# Patient Record
Sex: Female | Born: 1940 | Race: White | Hispanic: No | Smoking: Never smoker
Health system: Southern US, Community
[De-identification: ages and names within clinical notes are randomized; demographics above are authoritative.]

## PROBLEM LIST (undated history)

## (undated) DIAGNOSIS — E785 Hyperlipidemia, unspecified: Secondary | ICD-10-CM

## (undated) DIAGNOSIS — I77810 Thoracic aortic ectasia: Secondary | ICD-10-CM

## (undated) DIAGNOSIS — I480 Paroxysmal atrial fibrillation: Secondary | ICD-10-CM

## (undated) DIAGNOSIS — D649 Anemia, unspecified: Secondary | ICD-10-CM

## (undated) DIAGNOSIS — M1990 Unspecified osteoarthritis, unspecified site: Secondary | ICD-10-CM

## (undated) DIAGNOSIS — R0600 Dyspnea, unspecified: Secondary | ICD-10-CM

## (undated) DIAGNOSIS — R51 Headache: Secondary | ICD-10-CM

## (undated) DIAGNOSIS — N189 Chronic kidney disease, unspecified: Secondary | ICD-10-CM

## (undated) DIAGNOSIS — I469 Cardiac arrest, cause unspecified: Secondary | ICD-10-CM

## (undated) DIAGNOSIS — I7121 Aneurysm of the ascending aorta, without rupture: Secondary | ICD-10-CM

## (undated) DIAGNOSIS — K219 Gastro-esophageal reflux disease without esophagitis: Secondary | ICD-10-CM

## (undated) DIAGNOSIS — R011 Cardiac murmur, unspecified: Secondary | ICD-10-CM

## (undated) DIAGNOSIS — I712 Thoracic aortic aneurysm, without rupture: Secondary | ICD-10-CM

## (undated) DIAGNOSIS — T8209XA Other mechanical complication of heart valve prosthesis, initial encounter: Secondary | ICD-10-CM

## (undated) DIAGNOSIS — Z8679 Personal history of other diseases of the circulatory system: Secondary | ICD-10-CM

## (undated) DIAGNOSIS — G43909 Migraine, unspecified, not intractable, without status migrainosus: Secondary | ICD-10-CM

## (undated) DIAGNOSIS — Z8669 Personal history of other diseases of the nervous system and sense organs: Secondary | ICD-10-CM

## (undated) DIAGNOSIS — I499 Cardiac arrhythmia, unspecified: Secondary | ICD-10-CM

## (undated) HISTORY — PX: TONSILLECTOMY: SUR1361

## (undated) HISTORY — DX: Personal history of other diseases of the nervous system and sense organs: Z86.69

## (undated) HISTORY — DX: Thoracic aortic ectasia: I77.810

## (undated) HISTORY — DX: Aneurysm of the ascending aorta, without rupture: I71.21

## (undated) HISTORY — DX: Personal history of other diseases of the circulatory system: Z86.79

## (undated) HISTORY — DX: Hyperlipidemia, unspecified: E78.5

## (undated) HISTORY — DX: Thoracic aortic aneurysm, without rupture: I71.2

## (undated) HISTORY — PX: EYE SURGERY: SHX253

## (undated) HISTORY — PX: ABDOMINAL HYSTERECTOMY: SHX81

## (undated) SURGERY — ECHOCARDIOGRAM, TRANSESOPHAGEAL
Anesthesia: Moderate Sedation

---

## 1998-06-13 ENCOUNTER — Ambulatory Visit (HOSPITAL_COMMUNITY): Admission: RE | Admit: 1998-06-13 | Discharge: 1998-06-13 | Payer: Self-pay | Admitting: Cardiology

## 2002-08-10 ENCOUNTER — Encounter: Payer: Self-pay | Admitting: Cardiovascular Disease

## 2002-08-10 ENCOUNTER — Ambulatory Visit (HOSPITAL_COMMUNITY): Admission: RE | Admit: 2002-08-10 | Discharge: 2002-08-10 | Payer: Self-pay | Admitting: Cardiovascular Disease

## 2002-09-30 ENCOUNTER — Encounter: Payer: Self-pay | Admitting: Cardiothoracic Surgery

## 2002-10-04 ENCOUNTER — Inpatient Hospital Stay (HOSPITAL_COMMUNITY): Admission: RE | Admit: 2002-10-04 | Discharge: 2002-10-12 | Payer: Self-pay | Admitting: Cardiothoracic Surgery

## 2002-10-04 ENCOUNTER — Encounter: Payer: Self-pay | Admitting: Cardiothoracic Surgery

## 2002-10-04 DIAGNOSIS — Z8679 Personal history of other diseases of the circulatory system: Secondary | ICD-10-CM

## 2002-10-04 HISTORY — PX: AORTIC VALVE REPLACEMENT: SHX41

## 2002-10-04 HISTORY — DX: Personal history of other diseases of the circulatory system: Z86.79

## 2002-10-05 ENCOUNTER — Encounter: Payer: Self-pay | Admitting: Cardiothoracic Surgery

## 2002-10-06 ENCOUNTER — Encounter: Payer: Self-pay | Admitting: Cardiothoracic Surgery

## 2002-10-07 ENCOUNTER — Encounter: Payer: Self-pay | Admitting: Cardiothoracic Surgery

## 2002-10-10 ENCOUNTER — Encounter: Payer: Self-pay | Admitting: Cardiothoracic Surgery

## 2002-11-01 ENCOUNTER — Encounter: Payer: Self-pay | Admitting: Cardiothoracic Surgery

## 2002-11-01 ENCOUNTER — Encounter: Admission: RE | Admit: 2002-11-01 | Discharge: 2002-11-01 | Payer: Self-pay | Admitting: Cardiothoracic Surgery

## 2010-10-15 ENCOUNTER — Ambulatory Visit (HOSPITAL_COMMUNITY): Payer: Medicare Other | Attending: Urology

## 2010-10-15 ENCOUNTER — Ambulatory Visit (HOSPITAL_BASED_OUTPATIENT_CLINIC_OR_DEPARTMENT_OTHER)
Admission: RE | Admit: 2010-10-15 | Discharge: 2010-10-15 | Disposition: A | Discharge status: Home / Self Care | Payer: Medicare Other | Source: Ambulatory Visit | Attending: Urology | Admitting: Urology

## 2010-10-15 DIAGNOSIS — N393 Stress incontinence (female) (male): Secondary | ICD-10-CM | POA: Insufficient documentation

## 2010-10-15 DIAGNOSIS — N8111 Cystocele, midline: Secondary | ICD-10-CM | POA: Insufficient documentation

## 2010-10-15 DIAGNOSIS — Z01818 Encounter for other preprocedural examination: Secondary | ICD-10-CM | POA: Insufficient documentation

## 2010-10-15 DIAGNOSIS — Z01812 Encounter for preprocedural laboratory examination: Secondary | ICD-10-CM | POA: Insufficient documentation

## 2010-10-15 LAB — POCT I-STAT 4, (NA,K, GLUC, HGB,HCT)
Glucose, Bld: 98 mg/dL (ref 70–99)
HCT: 38 % (ref 36.0–46.0)
Hemoglobin: 12.9 g/dL (ref 12.0–15.0)
Potassium: 4.5 mEq/L (ref 3.5–5.1)
Sodium: 143 mEq/L (ref 135–145)

## 2010-10-19 NOTE — Op Note (Signed)
[name] [name], [name] [name]             ACCOUNT NO.:  [id number]  MEDICAL RECORD NO.:  [id number]           PATIENT TYPE:  LOCATION:                                 FACILITY:  PHYSICIAN:  Daman Steffenhagen C. Karsten Ro, M.D.       DATE OF BIRTH:  DATE OF PROCEDURE:  10/15/2010 DATE OF DISCHARGE:                              OPERATIVE REPORT   PREOPERATIVE DIAGNOSES: 1. Cystocele. 2. Stress incontinence.  POSTOPERATIVE DIAGNOSIS:  Cystocele.  PROCEDURE:  Anterior repair.  SURGEON:  Baylie Drakes C. Karsten Ro, MD  ANESTHESIA:  General.  SPECIMENS:  None.  BLOOD LOSS:  Minimal.  DRAINS:  None.  COMPLICATIONS:  None.  INDICATIONS:  The patient is a 70 year old female who initially presented with a large cystocele, what was felt to be possible stress urinary incontinence.  She noted no stress incontinence prior to the development of her cystocele.  She then reported some mild leakage and I therefore performed urodynamics and found that in fact she did not leak with her cystocele reduced despite provocative testing.  I still felt that by her history, consideration of a suburethral sling in conjunction with her anterior repair should be performed and discussed the procedure as well as its risks and complications.  She understands and has elected to proceed.  DESCRIPTION OF OPERATION:  After the patient received preoperative antibiotics, which consisted of ampicillin 2 g plus gentamicin 1.5 mg/kg due to her prosthetic heart valve.  She was then brought to the major OR and placed on the table.  She was administered general anesthesia and then moved to the dorsal lithotomy position.  Her genitalia and vagina as well as perineum were sterilely prepped and draped.  I then performed an official time-out.  I initially placed a 16-French Foley catheter in the bladder and drained the bladder and a weighted speculum in the vagina.  I noted significant cystocele, but in fact, the urethrovesical junction  appeared to be high and well supported.  I injected 0.5% Marcaine with epinephrine in the subvaginal mucosa in the midline, and after allowing adequate time for epinephrine effect, a midline incision was made over the cystocele and Allis clamps were placed on the vaginal mucosal edges.  I then used a combination of sharp and blunt technique to dissect the cystocele from the vaginal mucosa on the right and left sides as well as posteriorly. I then reduced the cystocele by using 2-0 braided permanent suture and used this to reapproximate the pubocervical fascia in the midline in an interrupted figure-of-8 fashion.  This completely reduced/obliterated the cystocele.  I then excised the redundant vaginal mucosa, irrigated and then closed the incision with running 2-0 Vicryl suture.  The patient was placed in exaggerated Trendelenburg position, and with the cystocele reduced, I palpated the urethra which was very well supported with no evidence of hypermobility and traction on the vaginal mucosa and this area resulted in no mobility identifiable.  Because it was in such good position with no mobility, I felt that placement of a sling was not necessary and therefore elected not to place the sling at this time.  I therefore irrigated the  vagina and placed 2-inch Iodoform gauze with Neosporin antibiotic ointment in the vagina and the patient was awakened and taken to recovery room after her Foley catheter had been removed.  She will be given discharge instructions as well as maintained on Cipro 250 mg b.i.d. #6 and given a prescription for Vicodin HP #28.  She will follow up in my office in 1 week.     [name] [name] C. Karsten Ro, M.D.     MCO/MEDQ  D:  10/15/2010  T:  10/15/2010  Job:  [id number]  Electronically Signed by Kathie Rhodes M.D. on 10/19/2010 01:48:15 PM

## 2010-10-29 HISTORY — PX: NM MYOCAR PERF WALL MOTION: HXRAD629

## 2011-08-12 DIAGNOSIS — H2589 Other age-related cataract: Secondary | ICD-10-CM | POA: Diagnosis not present

## 2011-10-24 DIAGNOSIS — R062 Wheezing: Secondary | ICD-10-CM | POA: Diagnosis not present

## 2011-10-24 DIAGNOSIS — J309 Allergic rhinitis, unspecified: Secondary | ICD-10-CM | POA: Diagnosis not present

## 2011-10-24 DIAGNOSIS — J329 Chronic sinusitis, unspecified: Secondary | ICD-10-CM | POA: Diagnosis not present

## 2011-10-24 DIAGNOSIS — H6980 Other specified disorders of Eustachian tube, unspecified ear: Secondary | ICD-10-CM | POA: Diagnosis not present

## 2012-02-12 DIAGNOSIS — R42 Dizziness and giddiness: Secondary | ICD-10-CM | POA: Diagnosis not present

## 2012-02-12 DIAGNOSIS — Z79899 Other long term (current) drug therapy: Secondary | ICD-10-CM | POA: Diagnosis not present

## 2012-02-12 DIAGNOSIS — R112 Nausea with vomiting, unspecified: Secondary | ICD-10-CM | POA: Diagnosis not present

## 2012-02-12 DIAGNOSIS — Z7982 Long term (current) use of aspirin: Secondary | ICD-10-CM | POA: Diagnosis not present

## 2012-02-24 DIAGNOSIS — H04129 Dry eye syndrome of unspecified lacrimal gland: Secondary | ICD-10-CM | POA: Diagnosis not present

## 2012-04-09 DIAGNOSIS — M949 Disorder of cartilage, unspecified: Secondary | ICD-10-CM | POA: Diagnosis not present

## 2012-04-09 DIAGNOSIS — E559 Vitamin D deficiency, unspecified: Secondary | ICD-10-CM | POA: Diagnosis not present

## 2012-04-09 DIAGNOSIS — Z1211 Encounter for screening for malignant neoplasm of colon: Secondary | ICD-10-CM | POA: Diagnosis not present

## 2012-04-09 DIAGNOSIS — M899 Disorder of bone, unspecified: Secondary | ICD-10-CM | POA: Diagnosis not present

## 2012-04-09 DIAGNOSIS — E782 Mixed hyperlipidemia: Secondary | ICD-10-CM | POA: Diagnosis not present

## 2012-04-09 DIAGNOSIS — Z79899 Other long term (current) drug therapy: Secondary | ICD-10-CM | POA: Diagnosis not present

## 2012-04-20 DIAGNOSIS — N182 Chronic kidney disease, stage 2 (mild): Secondary | ICD-10-CM | POA: Diagnosis not present

## 2012-04-21 DIAGNOSIS — M810 Age-related osteoporosis without current pathological fracture: Secondary | ICD-10-CM | POA: Diagnosis not present

## 2012-04-23 ENCOUNTER — Other Ambulatory Visit (HOSPITAL_COMMUNITY): Payer: Self-pay | Admitting: Cardiovascular Disease

## 2012-04-23 DIAGNOSIS — I061 Rheumatic aortic insufficiency: Secondary | ICD-10-CM | POA: Diagnosis not present

## 2012-04-23 DIAGNOSIS — E782 Mixed hyperlipidemia: Secondary | ICD-10-CM | POA: Diagnosis not present

## 2012-04-23 DIAGNOSIS — I77810 Thoracic aortic ectasia: Secondary | ICD-10-CM

## 2012-04-24 DIAGNOSIS — Z1231 Encounter for screening mammogram for malignant neoplasm of breast: Secondary | ICD-10-CM | POA: Diagnosis not present

## 2012-05-04 DIAGNOSIS — J209 Acute bronchitis, unspecified: Secondary | ICD-10-CM | POA: Diagnosis not present

## 2012-05-04 DIAGNOSIS — J0100 Acute maxillary sinusitis, unspecified: Secondary | ICD-10-CM | POA: Diagnosis not present

## 2012-05-11 ENCOUNTER — Ambulatory Visit (HOSPITAL_COMMUNITY)
Admission: RE | Admit: 2012-05-11 | Discharge: 2012-05-11 | Disposition: A | Discharge status: Home / Self Care | Payer: Medicare Other | Source: Ambulatory Visit | Attending: Cardiovascular Disease | Admitting: Cardiovascular Disease

## 2012-05-11 DIAGNOSIS — I77810 Thoracic aortic ectasia: Secondary | ICD-10-CM

## 2012-05-11 HISTORY — DX: Thoracic aortic ectasia: I77.810

## 2012-05-11 MED ORDER — IOHEXOL 350 MG/ML SOLN
100.0000 mL | Freq: Once | INTRAVENOUS | Status: AC | PRN
  Administered 2012-05-11: 100 mL via INTRAVENOUS

## 2012-05-27 ENCOUNTER — Ambulatory Visit (HOSPITAL_COMMUNITY): Payer: Medicare Other

## 2012-05-27 ENCOUNTER — Other Ambulatory Visit (HOSPITAL_COMMUNITY): Payer: Self-pay | Admitting: Cardiovascular Disease

## 2012-05-27 ENCOUNTER — Ambulatory Visit (HOSPITAL_COMMUNITY)
Admission: RE | Admit: 2012-05-27 | Discharge: 2012-05-27 | Disposition: A | Discharge status: Home / Self Care | Payer: Medicare Other | Source: Ambulatory Visit | Attending: Cardiovascular Disease | Admitting: Cardiovascular Disease

## 2012-05-27 DIAGNOSIS — Z01818 Encounter for other preprocedural examination: Secondary | ICD-10-CM

## 2012-05-28 ENCOUNTER — Encounter: Payer: Self-pay | Admitting: *Deleted

## 2012-05-28 DIAGNOSIS — I712 Thoracic aortic aneurysm, without rupture: Secondary | ICD-10-CM | POA: Insufficient documentation

## 2012-05-28 DIAGNOSIS — Z8679 Personal history of other diseases of the circulatory system: Secondary | ICD-10-CM | POA: Insufficient documentation

## 2012-05-28 DIAGNOSIS — Z8669 Personal history of other diseases of the nervous system and sense organs: Secondary | ICD-10-CM | POA: Insufficient documentation

## 2012-05-28 DIAGNOSIS — I77810 Thoracic aortic ectasia: Secondary | ICD-10-CM | POA: Insufficient documentation

## 2012-05-29 ENCOUNTER — Institutional Professional Consult (permissible substitution) (INDEPENDENT_AMBULATORY_CARE_PROVIDER_SITE_OTHER): Payer: Medicare Other | Admitting: Cardiothoracic Surgery

## 2012-05-29 ENCOUNTER — Encounter: Payer: Self-pay | Admitting: Cardiothoracic Surgery

## 2012-05-29 VITALS — BP 106/68 | HR 66 | Resp 16 | Ht 61.0 in | Wt 128.0 lb

## 2012-05-29 DIAGNOSIS — I712 Thoracic aortic aneurysm, without rupture: Secondary | ICD-10-CM

## 2012-05-29 NOTE — Progress Notes (Signed)
PCP is Ann Held, MD Referring Provider is Lorretta Harp, MD  Chief Complaint  Patient presents with  . 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 It is asymptomatic. 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 At that time aorta was noted to be mildly enlarged. The aortotomy was closed with 2 Teflon felt strips. The patient is been followed annually with a 2-D echo showing the aortic valve prosthesis to be functioning well with minimal gradient and with minimal aortic insufficiency. The patient had a Myoview perfusion scan one year ago which showed no evidence of regional ischemia. LV function is normal by echo.  I reviewed the CTA of the thoracic aorta showing the fusiform ascending aneurysm. There is no evidence of penetrating ulcer or hematoma. The distal arch and descending thoracic aorta appeared normal. There are no suspicious pulmonary nodules or abnormal mediastinal lymph nodes. The patient has mild hypertension controlled with meds, mild to moderate hyperlipidemia treated with meds and she denies any family history of aortic aneurysm or aortic dissection.  The patient has dental hygiene twice years and had dental x-rays earlier this week which showed no evidence of significant dental disease.  Past Medical History  Diagnosis Date  . 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 H/O aortic valve disorder 10/04/2002    Dr. Tharon Aquas Trigt  . Thoracic ascending aortic aneurysm     CTA CHEST 05/11/12  . History of migraine headaches     Past Surgical History  Procedure Date  . Aortic valve replacement 10/04/2002     Dr. Tharon Aquas Trigt    #21 pericardial stented  valve    No family history on file.  Social History History  Substance Use Topics  . Smoking status: Never Smoker   . Smokeless tobacco: Never Used  . Alcohol Use: No    Current Outpatient Prescriptions  Medication Sig Dispense Refill  . AMOXICILLIN PO Take by mouth. ANTIBIOTIC PROPHYLAXIS FOR DENTAL/SURGICAL PROCEDURES      . aspirin 81 MG tablet Take 81 mg by mouth daily.      [name] azelastine (ASTELIN) 137 MCG/SPRAY nasal spray Place 1 spray into the nose 2 (two) times daily. Use in each nostril as directed      . calcium carbonate 200 MG capsule Take 500 mg by mouth daily.      . cholecalciferol (VITAMIN D) 1000 UNITS tablet Take 1,000 Units by mouth daily. TAKES 4,000 UNITS PER DAY      . CINNAMON PO Take 2,000 mg by mouth.      . fish oil-omega-3 fatty acids 1000 MG capsule Take 2 g by mouth daily.      . frovatriptan (FROVA) 2.5 MG tablet Take 2.5 mg by mouth as needed. If recurs, may repeat after 2 hours. Max of 3 tabs in 24 hours.      [name] lisinopril (PRINIVIL,ZESTRIL) 5 MG tablet Take 5 mg by mouth daily.      . Multiple Vitamins-Minerals (OCUVITE PO) Take 1 tablet by mouth.      . pantoprazole (PROTONIX) 40 MG tablet Take 40 mg by mouth daily.      . propranolol (INDERAL) 20 MG tablet  Take 20 mg by mouth.      . SUMAtriptan (IMITREX) 50 MG tablet Take 50 mg by mouth every 2 (two) hours as needed.        Allergies  Allergen Reactions  . Codeine Nausea And Vomiting    Review of Systems Constitutional negative for fever weight loss. The patient was told by her primary care physician Dr. Nicki Reaper to give up or equine horse back riding or heavy lifting. HEENT some visual symptoms-scotomata, dizziness progressive over the past 3-6 months without syncope or fall no difficulty swallowing Thorax no trauma no productive cough sternal incision well-healed since surgery Cardiac minimal aortic insufficiency of her bioprosthetic valve at 10 years postop, evaluation of 73 year old valve  with TEE is pending repeat left and right heart cath pending to evaluate coronary disease and right heart function GI negative for blood per rectum reflux Endocrine negative for diabetes Vascular negative for DVT claudication Neuro negative for stroke or seizure   BP 106/68  Pulse 66  Resp 16  Ht 5\' 1"  (1.549 m)  Wt 128 lb (58.06 kg)  BMI 24.19 kg/m2  SpO2 98% Physical Exam Gen. 71 year old female comfortable and alert HEENT normocephalic pupils equal Neck without bruit JVD or mass Cardiac soft flow murmur in systole through valve no diastolic murmur no gallop or rub  Abdomen soft nontender without pulsatile mass  Extremities warm well perfused no clubbing cyanosis edema or tenderness  Skin without rash or lesion  Thorax no deformity sternal incision well-healed breath sounds clear and equal bilaterally  Neuro normal gait no focal motor deficit  Diagnostic Tests:   Impression: Fusiform ascending thoracic aneurysm 5 cm above the sinotubular junction with prior tissue aVR 10 years ago for bicuspid AS  Plan:Patient will need replacement of the ascending aorta and possibly a root replacement with a redo valve. Prior to surgery she will need a TEE and right and left heart cath. We'll contact Dr. [name] to arrange these studies and review the patient following completion of these studies.

## 2012-06-03 ENCOUNTER — Encounter (HOSPITAL_COMMUNITY): Payer: Self-pay | Admitting: Pharmacy Technician

## 2012-06-12 DIAGNOSIS — R6889 Other general symptoms and signs: Secondary | ICD-10-CM | POA: Diagnosis not present

## 2012-06-12 DIAGNOSIS — Z01818 Encounter for other preprocedural examination: Secondary | ICD-10-CM | POA: Diagnosis not present

## 2012-06-12 DIAGNOSIS — R5381 Other malaise: Secondary | ICD-10-CM | POA: Diagnosis not present

## 2012-06-15 ENCOUNTER — Other Ambulatory Visit: Payer: Self-pay | Admitting: Cardiovascular Disease

## 2012-06-17 ENCOUNTER — Other Ambulatory Visit: Payer: Self-pay | Admitting: Cardiovascular Disease

## 2012-06-17 MED ORDER — SODIUM CHLORIDE 0.9 % IV SOLN: INTRAVENOUS | Status: DC

## 2012-06-18 ENCOUNTER — Encounter (HOSPITAL_COMMUNITY)
Admission: RE | Disposition: A | Discharge status: Home / Self Care | Payer: Self-pay | Source: Ambulatory Visit | Attending: Cardiovascular Disease

## 2012-06-18 ENCOUNTER — Encounter (HOSPITAL_COMMUNITY): Payer: Self-pay | Admitting: *Deleted

## 2012-06-18 ENCOUNTER — Ambulatory Visit (HOSPITAL_COMMUNITY)
Admission: RE | Admit: 2012-06-18 | Discharge: 2012-06-18 | Disposition: A | Discharge status: Home / Self Care | Payer: Medicare Other | Source: Ambulatory Visit | Attending: Cardiovascular Disease | Admitting: Cardiovascular Disease

## 2012-06-18 DIAGNOSIS — Z952 Presence of prosthetic heart valve: Secondary | ICD-10-CM | POA: Diagnosis not present

## 2012-06-18 DIAGNOSIS — I712 Thoracic aortic aneurysm, without rupture, unspecified: Secondary | ICD-10-CM | POA: Insufficient documentation

## 2012-06-18 HISTORY — DX: Headache: R51

## 2012-06-18 HISTORY — PX: TEE WITHOUT CARDIOVERSION: SHX5443

## 2012-06-18 HISTORY — PX: LEFT AND RIGHT HEART CATHETERIZATION WITH CORONARY ANGIOGRAM: SHX5449

## 2012-06-18 SURGERY — ECHOCARDIOGRAM, TRANSESOPHAGEAL
Anesthesia: Moderate Sedation

## 2012-06-18 SURGERY — LEFT AND RIGHT HEART CATHETERIZATION WITH CORONARY ANGIOGRAM
Anesthesia: LOCAL

## 2012-06-18 MED ORDER — MORPHINE SULFATE 4 MG/ML IJ SOLN: 1.0000 mg | INTRAMUSCULAR | Status: DC | PRN

## 2012-06-18 MED ORDER — LIDOCAINE HCL (PF) 1 % IJ SOLN
INTRAMUSCULAR | Status: AC
  Filled 2012-06-18: qty 30

## 2012-06-18 MED ORDER — FENTANYL CITRATE 0.05 MG/ML IJ SOLN
INTRAMUSCULAR | Status: AC
  Filled 2012-06-18: qty 2

## 2012-06-18 MED ORDER — MIDAZOLAM HCL 5 MG/ML IJ SOLN
INTRAMUSCULAR | Status: AC
  Filled 2012-06-18: qty 2

## 2012-06-18 MED ORDER — SODIUM CHLORIDE 0.9 % IV SOLN: INTRAVENOUS | Status: AC

## 2012-06-18 MED ORDER — BUTAMBEN-TETRACAINE-BENZOCAINE 2-2-14 % EX AERO
INHALATION_SPRAY | CUTANEOUS | Status: DC | PRN
  Administered 2012-06-18: 2 via TOPICAL

## 2012-06-18 MED ORDER — MIDAZOLAM HCL 2 MG/2ML IJ SOLN
INTRAMUSCULAR | Status: AC
  Filled 2012-06-18: qty 2

## 2012-06-18 MED ORDER — ACETAMINOPHEN 325 MG PO TABS
650.0000 mg | ORAL_TABLET | ORAL | Status: DC | PRN
  Filled 2012-06-18: qty 2

## 2012-06-18 MED ORDER — NITROGLYCERIN 0.2 MG/ML ON CALL CATH LAB
INTRAVENOUS | Status: AC
  Filled 2012-06-18: qty 1

## 2012-06-18 MED ORDER — SODIUM CHLORIDE 0.9 % IV SOLN: INTRAVENOUS | Status: DC

## 2012-06-18 MED ORDER — FENTANYL CITRATE 0.05 MG/ML IJ SOLN
INTRAMUSCULAR | Status: DC | PRN
  Administered 2012-06-18: 25 ug via INTRAVENOUS

## 2012-06-18 MED ORDER — HEPARIN (PORCINE) IN NACL 2-0.9 UNIT/ML-% IJ SOLN
INTRAMUSCULAR | Status: AC
  Filled 2012-06-18: qty 1000

## 2012-06-18 MED ORDER — ONDANSETRON HCL 4 MG/2ML IJ SOLN: 4.0000 mg | Freq: Four times a day (QID) | INTRAMUSCULAR | Status: DC | PRN

## 2012-06-18 MED ORDER — MIDAZOLAM HCL 10 MG/2ML IJ SOLN
INTRAMUSCULAR | Status: DC | PRN
  Administered 2012-06-18: 2 mg via INTRAVENOUS
  Administered 2012-06-18: 1 mg via INTRAVENOUS

## 2012-06-18 NOTE — Op Note (Signed)
[name] [name] is a 71 y.o. female    [id number] LOCATION:  FACILITY: [facility]  PHYSICIAN: Quay Burow, M.D. 01/14/1941   DATE OF PROCEDURE:  06/18/2012  DATE OF DISCHARGE:  [facility]  CARDIAC CATHETERIZATION     History obtained from chart review. [name] is a 71 year old thin appearing married Caucasian female mother of one mammogram appointment child who critical aortic stenosis with normal coronary arteries status post porcine aVR performed by Dr. Tharon Aquas trach 08/10/2002. She has a 5.8 cm ascending aortic thoracic aneurysm with a well functioning aortic prosthesis, valve area of 1.12 m with a peak gradient of 17 a mean of 12. She had a transesophageal echo performed today and presents now for right and left heart catheter to find her coronary anatomy.   PROCEDURE DESCRIPTION:    The patient was brought to the second floor  [place] Cardiac cath lab in the postabsorptive state. She was premedicated with Valium 5 mg by mouth. Her right groin was prepped and shaved in usual sterile fashion. Xylocaine 1% was used for local anesthesia. A 5 French sheath was inserted into the right common femoral artery using standard Seldinger technique. A 5 French sheath was inserted into the right common femoral vein. A 5 French long tipped Swan-Ganz catheter was then advanced through the right heart chambers appearing sequential pressures . 5 French right and left Judkins diagnostic catheters were used for selective coronary angiography. As the patient does use for the entirety of the case. Retrograde aorta pressures monitored during the case.      AO SYSTOLIC/AO DIASTOLIC: [id number]   Pulmonary capillary wedge pressure: 8/11 mean equals 4  Right atrial pressure: 7/6, mean equals 4   Right ventricular pressure: 29 systolic and diastolic 4  Pulmonary artery pressure: Systolic 31 end diastolic pressure 16  ANGIOGRAPHIC RESULTS:   1. Left main; normal    2. LAD; normal 3. Left circumflex; nondominant and normal.  4. Right coronary artery; dominant and normal    IMPRESSION:[name]  has normal filling pressures and normal coronary arteries. She had trans esophageal echo was performed prior to this procedure. Her arterial sheath was removed and a MYNX closure device was used to achieve hemostasis. Pressure was held on the groin after the venous sheath was removed. The patient left the Cath Lab in stable condition. Dr. Collier Salina an Kerby Less was notified.  Lorretta Harp MD, [facility] 06/18/2012 4:13 PM

## 2012-06-18 NOTE — Progress Notes (Signed)
*  PRELIMINARY RESULTS* Echocardiogram Echocardiogram Transesophageal has been performed.  [name] [name] 06/18/2012, 1:37 PM

## 2012-06-18 NOTE — H&P (Signed)
Date of Initial H&P: Dr. [name] 05/25/2012  History reviewed, patient examined, no change in status, stable for surgery. TEE and heaqrt catheterization today in anticipation of aortic root repair. This procedure has been fully reviewed with the patient and written informed consent has been obtained.  Sanda Klein, MD, [place] [phone] office [phone] pager

## 2012-06-19 ENCOUNTER — Encounter (HOSPITAL_COMMUNITY): Payer: Self-pay | Admitting: Cardiovascular Disease

## 2012-06-24 ENCOUNTER — Ambulatory Visit: Payer: Medicare Other | Admitting: Cardiothoracic Surgery

## 2012-06-29 ENCOUNTER — Encounter: Payer: Self-pay | Admitting: Cardiothoracic Surgery

## 2012-06-29 ENCOUNTER — Ambulatory Visit (INDEPENDENT_AMBULATORY_CARE_PROVIDER_SITE_OTHER): Payer: Medicare Other | Admitting: Cardiothoracic Surgery

## 2012-06-29 VITALS — BP 96/66 | HR 60 | Resp 16 | Ht 61.0 in | Wt 128.0 lb

## 2012-06-29 DIAGNOSIS — I712 Thoracic aortic aneurysm, without rupture: Secondary | ICD-10-CM

## 2012-06-29 NOTE — Progress Notes (Signed)
PCP is Ann Held, MD Referring Provider is Lorretta Harp, MD  Chief Complaint  Patient presents with  . TAA    futher discuss surgery s/p CATH and TEE    HPI: Patient returns for final discussion and scheduling of fusiform ascending thoracic aortic aneurysm measuring 6 cm. She had a 21 mm Edwards pericardial tissue valve placed in 2004. A recent transesophageal echo showed the prosthetic valve be functioning well without significant stenosis or insufficiency. The aneurysm is asymptomatic. Her LV function is good. She had a recent left and right heart catheterization by Dr. [name] which demonstrated normal coronaries normal right heart pressures. She denies any recent pulmonary symptoms of upper respiratory infection or pneumonia. She denies any symptoms of CHF.    Past Medical History  Diagnosis Date  . 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 H/O aortic valve disorder 10/04/2002    Dr. Tharon Aquas Trigt  . Thoracic ascending aortic aneurysm     CTA CHEST 05/11/12  . History of migraine headaches   . Headache     Past Surgical History  Procedure Date  . Aortic valve replacement 10/04/2002     Dr. Tharon Aquas Trigt    #21 pericardial stented valve  . Abdominal hysterectomy   . Eye surgery   . Tee without cardioversion 06/18/2012    Procedure: TRANSESOPHAGEAL ECHOCARDIOGRAM (TEE);  Surgeon: Sanda Klein, MD;  Location: [facility] ENDOSCOPY;  Service: Cardiovascular;  Laterality: N/A;  right and left heart cath after this procedure    No family history on file.  Social History History  Substance Use Topics  . Smoking status: Never Smoker   . Smokeless tobacco: Never Used  . Alcohol Use: No    Current Outpatient Prescriptions  Medication Sig Dispense Refill  . aspirin EC 81 MG tablet Take 81 mg by mouth daily.      . calcium carbonate 200 MG capsule Take 200 mg by mouth 2 (two) times daily with a meal.      . cholecalciferol (VITAMIN D) 1000 UNITS tablet Take 1,000 Units by  mouth daily.       [name] CINNAMON PO Take 2,000 mg by mouth daily.       . fish oil-omega-3 fatty acids 1000 MG capsule Take 1 g by mouth daily.       [name] lisinopril (PRINIVIL,ZESTRIL) 5 MG tablet Take 5 mg by mouth daily.      . Multiple Vitamins-Minerals (OCUVITE PO) Take 1 tablet by mouth.      . pantoprazole (PROTONIX) 40 MG tablet Take 40 mg by mouth daily.      Vladimir Faster Glycol-Propyl Glycol (SYSTANE OP) Place 1 drop into both eyes daily.      . propranolol (INDERAL) 20 MG tablet Take 20 mg by mouth 2 (two) times daily.       . SUMAtriptan (IMITREX) 50 MG tablet Take 50 mg by mouth every 2 (two) hours as needed. For migraines        Allergies  Allergen Reactions  . Codeine Nausea And Vomiting    Review of Systems  no fever or weight loss No change in vision difficulty swallowing, routine dental hygiene performed within the past 6 months. Orthopano gram shows no dental abscess No pulmonary symptoms of infection No cardiac symptoms of angina or CHF No GI complaints No history of peripheral edema No neurologic symptoms of TIA or presyncope   BP 96/66  Pulse 60  Resp 16  Ht 5'  1" (1.549 m)  Wt 128 lb (58.06 kg)  BMI 24.19 kg/m2  SpO2 97% Physical Exam  general appearance that of a healthy appearing 71 year old female accompanied by her husband HEENT normocephalic dentition good Neck without JVD mass or bruit Thorax -well-healed sternal incision breath sounds clear   cardiac regular rhythm without murmur or gallop Abdomen soft nontender without pulsatile mass  extremities warm nontender with good pulses, no edema Neurologic alert and oriented without focal motor deficit  Diagnostic Tests:  echo, cardiac cath, CTA's all reviewed showing normal functioning aortic valve with a fusiform ascending aneurysm extending up to the arch   Impression:Fusiform ascending aneurysm which will require circulatory arrest, probable right axillary artery cannulation for replacement with a  graft. At the time of surgery aortic valve will be carefully inspected and it signs of deterioration are present and it will be replaced with a new tissue valve.      Plan   Redo sternotomy, replacement of the ascending aortic aneurysm and possible replacement of the aortic valve scheduled for 07/07/2012. Operative mortality or major morbidity quoted at 5% patient and husband.

## 2012-06-30 ENCOUNTER — Other Ambulatory Visit: Payer: Self-pay | Admitting: *Deleted

## 2012-06-30 ENCOUNTER — Other Ambulatory Visit: Payer: Self-pay | Admitting: Cardiothoracic Surgery

## 2012-06-30 DIAGNOSIS — I712 Thoracic aortic aneurysm, without rupture: Secondary | ICD-10-CM

## 2012-07-06 ENCOUNTER — Other Ambulatory Visit (HOSPITAL_COMMUNITY): Payer: Self-pay

## 2012-07-06 ENCOUNTER — Inpatient Hospital Stay (HOSPITAL_COMMUNITY)
Admission: RE | Admit: 2012-07-06 | Discharge: 2012-07-06 | Disposition: A | Discharge status: Home / Self Care | Payer: Medicare Other | Source: Ambulatory Visit | Attending: Cardiothoracic Surgery | Admitting: Cardiothoracic Surgery

## 2012-07-06 ENCOUNTER — Encounter (HOSPITAL_COMMUNITY): Payer: Self-pay

## 2012-07-06 ENCOUNTER — Encounter (HOSPITAL_COMMUNITY)
Admission: RE | Admit: 2012-07-06 | Discharge: 2012-07-06 | Disposition: A | Discharge status: Home / Self Care | Payer: Medicare Other | Source: Ambulatory Visit | Attending: Cardiothoracic Surgery | Admitting: Cardiothoracic Surgery

## 2012-07-06 ENCOUNTER — Ambulatory Visit (HOSPITAL_COMMUNITY)
Admission: RE | Admit: 2012-07-06 | Discharge: 2012-07-06 | Disposition: A | Discharge status: Home / Self Care | Payer: Medicare Other | Source: Ambulatory Visit | Attending: Cardiothoracic Surgery | Admitting: Cardiothoracic Surgery

## 2012-07-06 VITALS — BP 115/77 | HR 63 | Temp 97.7°F | Resp 18 | Ht 61.0 in | Wt 130.7 lb

## 2012-07-06 DIAGNOSIS — I77810 Thoracic aortic ectasia: Secondary | ICD-10-CM

## 2012-07-06 DIAGNOSIS — D62 Acute posthemorrhagic anemia: Secondary | ICD-10-CM | POA: Diagnosis not present

## 2012-07-06 DIAGNOSIS — Z8679 Personal history of other diseases of the circulatory system: Secondary | ICD-10-CM

## 2012-07-06 DIAGNOSIS — I469 Cardiac arrest, cause unspecified: Secondary | ICD-10-CM | POA: Diagnosis not present

## 2012-07-06 DIAGNOSIS — I712 Thoracic aortic aneurysm, without rupture, unspecified: Secondary | ICD-10-CM | POA: Insufficient documentation

## 2012-07-06 DIAGNOSIS — I517 Cardiomegaly: Secondary | ICD-10-CM | POA: Diagnosis not present

## 2012-07-06 DIAGNOSIS — Z954 Presence of other heart-valve replacement: Secondary | ICD-10-CM | POA: Diagnosis not present

## 2012-07-06 DIAGNOSIS — Z01810 Encounter for preprocedural cardiovascular examination: Secondary | ICD-10-CM | POA: Diagnosis not present

## 2012-07-06 DIAGNOSIS — I4892 Unspecified atrial flutter: Secondary | ICD-10-CM | POA: Diagnosis not present

## 2012-07-06 HISTORY — DX: Unspecified osteoarthritis, unspecified site: M19.90

## 2012-07-06 HISTORY — DX: Migraine, unspecified, not intractable, without status migrainosus: G43.909

## 2012-07-06 HISTORY — DX: Gastro-esophageal reflux disease without esophagitis: K21.9

## 2012-07-06 HISTORY — DX: Cardiac murmur, unspecified: R01.1

## 2012-07-06 LAB — BLOOD GAS, ARTERIAL
Acid-Base Excess: 0.2 mmol/L (ref 0.0–2.0)
Bicarbonate: 23.7 mEq/L (ref 20.0–24.0)
Drawn by: 344381
FIO2: 0.21 %
O2 Saturation: 98.4 %
Patient temperature: 98.6
TCO2: 24.8 mmol/L (ref 0–100)
pCO2 arterial: 34.6 mmHg — ABNORMAL LOW (ref 35.0–45.0)
pH, Arterial: 7.451 — ABNORMAL HIGH (ref 7.350–7.450)
pO2, Arterial: 94.9 mmHg (ref 80.0–100.0)

## 2012-07-06 LAB — URINALYSIS, ROUTINE W REFLEX MICROSCOPIC
Bilirubin Urine: NEGATIVE
Glucose, UA: NEGATIVE mg/dL
Hgb urine dipstick: NEGATIVE
Ketones, ur: NEGATIVE mg/dL
Leukocytes, UA: NEGATIVE
Nitrite: NEGATIVE
Protein, ur: NEGATIVE mg/dL
Specific Gravity, Urine: 1.006 (ref 1.005–1.030)
Urobilinogen, UA: 0.2 mg/dL (ref 0.0–1.0)
pH: 7 (ref 5.0–8.0)

## 2012-07-06 LAB — COMPREHENSIVE METABOLIC PANEL
ALT: 11 U/L (ref 0–35)
AST: 19 U/L (ref 0–37)
Albumin: 3.6 g/dL (ref 3.5–5.2)
Alkaline Phosphatase: 55 U/L (ref 39–117)
BUN: 14 mg/dL (ref 6–23)
CO2: 21 mEq/L (ref 19–32)
Calcium: 9.8 mg/dL (ref 8.4–10.5)
Chloride: 105 mEq/L (ref 96–112)
Creatinine, Ser: 1.27 mg/dL — ABNORMAL HIGH (ref 0.50–1.10)
GFR calc Af Amer: 48 mL/min — ABNORMAL LOW (ref >90)
GFR calc non Af Amer: 41 mL/min — ABNORMAL LOW (ref >90)
Glucose, Bld: 94 mg/dL (ref 70–99)
Potassium: 4.8 mEq/L (ref 3.5–5.1)
Sodium: 137 mEq/L (ref 135–145)
Total Bilirubin: 0.6 mg/dL (ref 0.3–1.2)
Total Protein: 6.7 g/dL (ref 6.0–8.3)

## 2012-07-06 LAB — CBC
HCT: 33.4 % — ABNORMAL LOW (ref 36.0–46.0)
Hemoglobin: 11.5 g/dL — ABNORMAL LOW (ref 12.0–15.0)
MCH: 32.5 pg (ref 26.0–34.0)
MCHC: 34.4 g/dL (ref 30.0–36.0)
MCV: 94.4 fL (ref 78.0–100.0)
Platelets: 218 10*3/uL (ref 150–400)
RBC: 3.54 MIL/uL — ABNORMAL LOW (ref 3.87–5.11)
RDW: 13 % (ref 11.5–15.5)
WBC: 5.1 10*3/uL (ref 4.0–10.5)

## 2012-07-06 LAB — PROTIME-INR
INR: 0.95 (ref 0.00–1.49)
Prothrombin Time: 12.6 seconds (ref 11.6–15.2)

## 2012-07-06 LAB — SURGICAL PCR SCREEN
MRSA, PCR: NEGATIVE
Staphylococcus aureus: NEGATIVE

## 2012-07-06 LAB — ABO/RH: ABO/RH(D): B NEG

## 2012-07-06 LAB — APTT: aPTT: 27 seconds (ref 24–37)

## 2012-07-06 LAB — HEMOGLOBIN A1C
Hgb A1c MFr Bld: 5.7 % — ABNORMAL HIGH (ref <5.7)
Mean Plasma Glucose: 117 mg/dL — ABNORMAL HIGH (ref <117)

## 2012-07-06 MED ORDER — VANCOMYCIN HCL 1000 MG IV SOLR
1500.0000 mg | INTRAVENOUS | Status: AC
  Administered 2012-07-07: 1500 mg via INTRAVENOUS
  Filled 2012-07-06 (×2): qty 1500

## 2012-07-06 MED ORDER — MAGNESIUM SULFATE 50 % IJ SOLN
40.0000 meq | INTRAMUSCULAR | Status: DC
  Filled 2012-07-06: qty 10

## 2012-07-06 MED ORDER — DEXTROSE 5 % IV SOLN
750.0000 mg | INTRAVENOUS | Status: DC
  Filled 2012-07-06: qty 750

## 2012-07-06 MED ORDER — SODIUM CHLORIDE 0.9 % IV SOLN
INTRAVENOUS | Status: AC
  Administered 2012-07-07: 1 [IU]/h via INTRAVENOUS
  Filled 2012-07-06: qty 1

## 2012-07-06 MED ORDER — AMINOCAPROIC ACID 250 MG/ML IV SOLN
INTRAVENOUS | Status: AC
  Administered 2012-07-07: 70 mL/h via INTRAVENOUS
  Filled 2012-07-06: qty 40

## 2012-07-06 MED ORDER — ALBUTEROL SULFATE (5 MG/ML) 0.5% IN NEBU
2.5000 mg | INHALATION_SOLUTION | Freq: Once | RESPIRATORY_TRACT | Status: AC
  Administered 2012-07-06: 2.5 mg via RESPIRATORY_TRACT

## 2012-07-06 MED ORDER — DEXMEDETOMIDINE HCL IN NACL 400 MCG/100ML IV SOLN
0.1000 ug/kg/h | INTRAVENOUS | Status: AC
  Administered 2012-07-07: 0.2 ug/kg/h via INTRAVENOUS
  Filled 2012-07-06: qty 100

## 2012-07-06 MED ORDER — PAPAVERINE HCL 30 MG/ML IJ SOLN
INTRAMUSCULAR | Status: AC
  Administered 2012-07-07: 09:00:00
  Filled 2012-07-06: qty 2.5

## 2012-07-06 MED ORDER — POTASSIUM CHLORIDE 2 MEQ/ML IV SOLN
80.0000 meq | INTRAVENOUS | Status: DC
  Filled 2012-07-06: qty 40

## 2012-07-06 MED ORDER — NITROGLYCERIN IN D5W 200-5 MCG/ML-% IV SOLN
2.0000 ug/min | INTRAVENOUS | Status: AC
  Administered 2012-07-07 (×2): 16.67 ug/min via INTRAVENOUS
  Filled 2012-07-06: qty 250

## 2012-07-06 MED ORDER — PHENYLEPHRINE HCL 10 MG/ML IJ SOLN
30.0000 ug/min | INTRAVENOUS | Status: AC
  Administered 2012-07-07: 40 ug/min via INTRAVENOUS
  Filled 2012-07-06: qty 2

## 2012-07-06 MED ORDER — METOPROLOL TARTRATE 12.5 MG HALF TABLET: 12.5000 mg | ORAL_TABLET | Freq: Once | ORAL | Status: DC

## 2012-07-06 MED ORDER — EPINEPHRINE HCL 1 MG/ML IJ SOLN
0.5000 ug/min | INTRAVENOUS | Status: DC
  Filled 2012-07-06: qty 4

## 2012-07-06 MED ORDER — DEXTROSE 5 % IV SOLN
1.5000 g | INTRAVENOUS | Status: AC
  Administered 2012-07-07: 1.5 g via INTRAVENOUS
  Administered 2012-07-07: .75 g via INTRAVENOUS
  Filled 2012-07-06 (×2): qty 1.5

## 2012-07-06 MED ORDER — DOPAMINE-DEXTROSE 3.2-5 MG/ML-% IV SOLN
2.0000 ug/kg/min | INTRAVENOUS | Status: DC
  Filled 2012-07-06: qty 250

## 2012-07-06 NOTE — Progress Notes (Addendum)
pft's done [id number]   Not in epic yet. Unable to get anyone in respiratory. Call person unable to help. Dub Mikes in vascular to fax dopplers when she gets them together

## 2012-07-06 NOTE — Progress Notes (Signed)
VASCULAR LAB PRELIMINARY  PRELIMINARY  PRELIMINARY  PRELIMINARY  Pre-op Cardiac Surgery  Carotid Findings:   No significant ICA stenosis or plaque visualized.  Both ICA are tortuous.  Vertebral artery flow antegrade.  Upper Extremity Right Left  Brachial Pressures 121 biphasic 121 biphasic  Radial Waveforms biphasic biphasic  Ulnar Waveforms biphasic biphasic  Palmar Arch (Allen's Test) Obliterates with ulnar compression and remains with in normal limits with ulnar compression Within normal limits with radial and ulnar compression   Findings:      Lower  Extremity Right Left  Dorsalis Pedis Triphasic 142 Triphasic 128  Anterior Tibial    Posterior Tibial Triphasic  146 Triphasic  138  Ankle/Brachial Indices 1.21 1.11    Findings:   Normal ABIS and pedal waveforms at rest.  [name] [name], 07/06/2012, 5:09 PM

## 2012-07-06 NOTE — Pre-Procedure Instructions (Addendum)
Nolan  0000000   Your procedure is scheduled on:  07/07/12  Report to [place] at530 AM.  Call this number if you have problems the morning of surgery: [phone]   Remember:   Do not eat food or drink:After Midnight.    Take these medicines the morning of surgery with A SIP OF WATER: protonix, propanolol, randitidine  STOP omega 3, cinnamon, multivit, naproxen   Do not wear jewelry, make-up or nail polish.  Do not wear lotions, powders, or perfumes. You may not wear deodorant.  Do not shave 48 hours prior to surgery. Men may shave face and neck.  Do not bring valuables to the hospital.  Contacts, dentures or bridgework may not be worn into surgery.  Leave suitcase in the car. After surgery it may be brought to your room.  For patients admitted to the hospital, checkout time is 11:00 AM the day of discharge.   Patients discharged the day of surgery will not be allowed to drive home.  Name and phone number of your driver: Chrissie Noa [phone]  Special Instructions: Incentive Spirometry - Practice and bring it with you on the day of surgery. Shower using CHG 2 nights before surgery and the night before surgery.  If you shower the day of surgery use CHG.  Use special wash - you have one bottle of CHG for all showers.  You should use approximately 1/3 of the bottle for each shower.   Please read over the following fact sheets that you were given: Pain Booklet, Coughing and Deep Breathing, Blood Transfusion Information, Open Heart Packet, MRSA Information and Surgical Site Infection Prevention

## 2012-07-07 ENCOUNTER — Encounter (HOSPITAL_COMMUNITY): Payer: Self-pay | Admitting: Certified Registered"

## 2012-07-07 ENCOUNTER — Encounter (HOSPITAL_COMMUNITY): Payer: Self-pay | Admitting: *Deleted

## 2012-07-07 ENCOUNTER — Inpatient Hospital Stay (HOSPITAL_COMMUNITY): Payer: Medicare Other

## 2012-07-07 ENCOUNTER — Inpatient Hospital Stay (HOSPITAL_COMMUNITY): Payer: Medicare Other | Admitting: Certified Registered"

## 2012-07-07 ENCOUNTER — Encounter (HOSPITAL_COMMUNITY)
Admission: RE | Disposition: A | Discharge status: Home Health | Payer: Self-pay | Source: Ambulatory Visit | Attending: Cardiothoracic Surgery

## 2012-07-07 ENCOUNTER — Inpatient Hospital Stay (HOSPITAL_COMMUNITY)
Admission: RE | Admit: 2012-07-07 | Discharge: 2012-07-19 | DRG: 219 | Disposition: A | Discharge status: Home Health | Payer: Medicare Other | Source: Ambulatory Visit | Attending: Cardiothoracic Surgery | Admitting: Cardiothoracic Surgery

## 2012-07-07 DIAGNOSIS — K219 Gastro-esophageal reflux disease without esophagitis: Secondary | ICD-10-CM | POA: Diagnosis present

## 2012-07-07 DIAGNOSIS — I712 Thoracic aortic aneurysm, without rupture, unspecified: Principal | ICD-10-CM | POA: Diagnosis present

## 2012-07-07 DIAGNOSIS — R5381 Other malaise: Secondary | ICD-10-CM | POA: Diagnosis not present

## 2012-07-07 DIAGNOSIS — I4892 Unspecified atrial flutter: Secondary | ICD-10-CM | POA: Diagnosis not present

## 2012-07-07 DIAGNOSIS — D62 Acute posthemorrhagic anemia: Secondary | ICD-10-CM | POA: Diagnosis not present

## 2012-07-07 DIAGNOSIS — I480 Paroxysmal atrial fibrillation: Secondary | ICD-10-CM | POA: Diagnosis not present

## 2012-07-07 DIAGNOSIS — I77810 Thoracic aortic ectasia: Secondary | ICD-10-CM | POA: Diagnosis present

## 2012-07-07 DIAGNOSIS — R21 Rash and other nonspecific skin eruption: Secondary | ICD-10-CM | POA: Diagnosis not present

## 2012-07-07 DIAGNOSIS — E8779 Other fluid overload: Secondary | ICD-10-CM | POA: Diagnosis not present

## 2012-07-07 DIAGNOSIS — J9819 Other pulmonary collapse: Secondary | ICD-10-CM | POA: Diagnosis not present

## 2012-07-07 DIAGNOSIS — K5900 Constipation, unspecified: Secondary | ICD-10-CM | POA: Diagnosis not present

## 2012-07-07 DIAGNOSIS — I469 Cardiac arrest, cause unspecified: Secondary | ICD-10-CM | POA: Diagnosis not present

## 2012-07-07 DIAGNOSIS — I4891 Unspecified atrial fibrillation: Secondary | ICD-10-CM | POA: Diagnosis not present

## 2012-07-07 DIAGNOSIS — I495 Sick sinus syndrome: Secondary | ICD-10-CM | POA: Diagnosis not present

## 2012-07-07 DIAGNOSIS — I959 Hypotension, unspecified: Secondary | ICD-10-CM | POA: Diagnosis not present

## 2012-07-07 DIAGNOSIS — N289 Disorder of kidney and ureter, unspecified: Secondary | ICD-10-CM | POA: Diagnosis not present

## 2012-07-07 DIAGNOSIS — IMO0001 Reserved for inherently not codable concepts without codable children: Secondary | ICD-10-CM

## 2012-07-07 DIAGNOSIS — D213 Benign neoplasm of connective and other soft tissue of thorax: Secondary | ICD-10-CM | POA: Diagnosis not present

## 2012-07-07 DIAGNOSIS — I359 Nonrheumatic aortic valve disorder, unspecified: Secondary | ICD-10-CM | POA: Diagnosis not present

## 2012-07-07 DIAGNOSIS — Z452 Encounter for adjustment and management of vascular access device: Secondary | ICD-10-CM | POA: Diagnosis not present

## 2012-07-07 DIAGNOSIS — Z9889 Other specified postprocedural states: Secondary | ICD-10-CM | POA: Diagnosis not present

## 2012-07-07 DIAGNOSIS — J90 Pleural effusion, not elsewhere classified: Secondary | ICD-10-CM | POA: Diagnosis not present

## 2012-07-07 DIAGNOSIS — Z954 Presence of other heart-valve replacement: Secondary | ICD-10-CM | POA: Diagnosis not present

## 2012-07-07 DIAGNOSIS — Z952 Presence of prosthetic heart valve: Secondary | ICD-10-CM | POA: Diagnosis not present

## 2012-07-07 HISTORY — DX: Cardiac arrest, cause unspecified: I46.9

## 2012-07-07 HISTORY — DX: Cardiac arrhythmia, unspecified: I49.9

## 2012-07-07 HISTORY — PX: REPLACEMENT ASCENDING AORTA: SHX6068

## 2012-07-07 LAB — POCT I-STAT 3, ART BLOOD GAS (G3+)
Acid-Base Excess: 7 mmol/L — ABNORMAL HIGH (ref 0.0–2.0)
Acid-Base Excess: 1 mmol/L (ref 0.0–2.0)
Acid-base deficit: 1 mmol/L (ref 0.0–2.0)
Acid-base deficit: 2 mmol/L (ref 0.0–2.0)
Acid-base deficit: 3 mmol/L — ABNORMAL HIGH (ref 0.0–2.0)
Acid-base deficit: 2 mmol/L (ref 0.0–2.0)
Acid-base deficit: 3 mmol/L — ABNORMAL HIGH (ref 0.0–2.0)
Acid-base deficit: 2 mmol/L (ref 0.0–2.0)
Bicarbonate: 25.1 mEq/L — ABNORMAL HIGH (ref 20.0–24.0)
Bicarbonate: 23.6 mEq/L (ref 20.0–24.0)
Bicarbonate: 32.5 mEq/L — ABNORMAL HIGH (ref 20.0–24.0)
Bicarbonate: 19 mEq/L — ABNORMAL LOW (ref 20.0–24.0)
Bicarbonate: 19.9 mEq/L — ABNORMAL LOW (ref 20.0–24.0)
Bicarbonate: 26.7 mEq/L — ABNORMAL HIGH (ref 20.0–24.0)
Bicarbonate: 23 mEq/L (ref 20.0–24.0)
Bicarbonate: 24.1 mEq/L — ABNORMAL HIGH (ref 20.0–24.0)
Bicarbonate: 24.9 mEq/L — ABNORMAL HIGH (ref 20.0–24.0)
O2 Saturation: 100 %
O2 Saturation: 100 %
O2 Saturation: 100 %
O2 Saturation: 83 %
O2 Saturation: 99 %
O2 Saturation: 100 %
O2 Saturation: 98 %
O2 Saturation: 93 %
O2 Saturation: 99 %
Patient temperature: 34.2
Patient temperature: 36.1
Patient temperature: 35.8
Patient temperature: 35.9
TCO2: 26 mmol/L (ref 0–100)
TCO2: 25 mmol/L (ref 0–100)
TCO2: 34 mmol/L (ref 0–100)
TCO2: 20 mmol/L (ref 0–100)
TCO2: 21 mmol/L (ref 0–100)
TCO2: 28 mmol/L (ref 0–100)
TCO2: 24 mmol/L (ref 0–100)
TCO2: 26 mmol/L (ref 0–100)
TCO2: 26 mmol/L (ref 0–100)
pCO2 arterial: 40.6 mmHg (ref 35.0–45.0)
pCO2 arterial: 39.5 mmHg (ref 35.0–45.0)
pCO2 arterial: 49.9 mmHg — ABNORMAL HIGH (ref 35.0–45.0)
pCO2 arterial: 20 mmHg — ABNORMAL LOW (ref 35.0–45.0)
pCO2 arterial: 26.3 mmHg — ABNORMAL LOW (ref 35.0–45.0)
pCO2 arterial: 39.6 mmHg (ref 35.0–45.0)
pCO2 arterial: 39 mmHg (ref 35.0–45.0)
pCO2 arterial: 51.7 mmHg — ABNORMAL HIGH (ref 35.0–45.0)
pCO2 arterial: 49.1 mmHg — ABNORMAL HIGH (ref 35.0–45.0)
pH, Arterial: 7.399 (ref 7.350–7.450)
pH, Arterial: 7.385 (ref 7.350–7.450)
pH, Arterial: 7.422 (ref 7.350–7.450)
pH, Arterial: 7.587 — ABNORMAL HIGH (ref 7.350–7.450)
pH, Arterial: 7.487 — ABNORMAL HIGH (ref 7.350–7.450)
pH, Arterial: 7.425 (ref 7.350–7.450)
pH, Arterial: 7.376 (ref 7.350–7.450)
pH, Arterial: 7.271 — ABNORMAL LOW (ref 7.350–7.450)
pH, Arterial: 7.307 — ABNORMAL LOW (ref 7.350–7.450)
pO2, Arterial: 500 mmHg — ABNORMAL HIGH (ref 80.0–100.0)
pO2, Arterial: 380 mmHg — ABNORMAL HIGH (ref 80.0–100.0)
pO2, Arterial: 542 mmHg — ABNORMAL HIGH (ref 80.0–100.0)
pO2, Arterial: 38 mmHg — CL (ref 80.0–100.0)
pO2, Arterial: 122 mmHg — ABNORMAL HIGH (ref 80.0–100.0)
pO2, Arterial: 249 mmHg — ABNORMAL HIGH (ref 80.0–100.0)
pO2, Arterial: 101 mmHg — ABNORMAL HIGH (ref 80.0–100.0)
pO2, Arterial: 73 mmHg — ABNORMAL LOW (ref 80.0–100.0)
pO2, Arterial: 178 mmHg — ABNORMAL HIGH (ref 80.0–100.0)

## 2012-07-07 LAB — POCT I-STAT 4, (NA,K, GLUC, HGB,HCT)
Glucose, Bld: 104 mg/dL — ABNORMAL HIGH (ref 70–99)
Glucose, Bld: 113 mg/dL — ABNORMAL HIGH (ref 70–99)
Glucose, Bld: 126 mg/dL — ABNORMAL HIGH (ref 70–99)
Glucose, Bld: 121 mg/dL — ABNORMAL HIGH (ref 70–99)
Glucose, Bld: 164 mg/dL — ABNORMAL HIGH (ref 70–99)
Glucose, Bld: 128 mg/dL — ABNORMAL HIGH (ref 70–99)
HCT: 26 % — ABNORMAL LOW (ref 36.0–46.0)
HCT: 20 % — ABNORMAL LOW (ref 36.0–46.0)
HCT: 21 % — ABNORMAL LOW (ref 36.0–46.0)
HCT: 33 % — ABNORMAL LOW (ref 36.0–46.0)
HCT: 28 % — ABNORMAL LOW (ref 36.0–46.0)
HCT: 34 % — ABNORMAL LOW (ref 36.0–46.0)
Hemoglobin: 8.8 g/dL — ABNORMAL LOW (ref 12.0–15.0)
Hemoglobin: 6.8 g/dL — CL (ref 12.0–15.0)
Hemoglobin: 7.1 g/dL — ABNORMAL LOW (ref 12.0–15.0)
Hemoglobin: 11.2 g/dL — ABNORMAL LOW (ref 12.0–15.0)
Hemoglobin: 9.5 g/dL — ABNORMAL LOW (ref 12.0–15.0)
Hemoglobin: 11.6 g/dL — ABNORMAL LOW (ref 12.0–15.0)
Potassium: 4.3 mEq/L (ref 3.5–5.1)
Potassium: 4.4 mEq/L (ref 3.5–5.1)
Potassium: 5.3 mEq/L — ABNORMAL HIGH (ref 3.5–5.1)
Potassium: 6.2 mEq/L — ABNORMAL HIGH (ref 3.5–5.1)
Potassium: 3.6 mEq/L (ref 3.5–5.1)
Potassium: 3.2 mEq/L — ABNORMAL LOW (ref 3.5–5.1)
Sodium: 141 mEq/L (ref 135–145)
Sodium: 139 mEq/L (ref 135–145)
Sodium: 140 mEq/L (ref 135–145)
Sodium: 136 mEq/L (ref 135–145)
Sodium: 143 mEq/L (ref 135–145)
Sodium: 143 mEq/L (ref 135–145)

## 2012-07-07 LAB — POCT I-STAT, CHEM 8
BUN: 11 mg/dL (ref 6–23)
Calcium, Ion: 0.91 mmol/L — ABNORMAL LOW (ref 1.13–1.30)
Chloride: 105 mEq/L (ref 96–112)
Creatinine, Ser: 1.1 mg/dL (ref 0.50–1.10)
Glucose, Bld: 154 mg/dL — ABNORMAL HIGH (ref 70–99)
HCT: 28 % — ABNORMAL LOW (ref 36.0–46.0)
Hemoglobin: 9.5 g/dL — ABNORMAL LOW (ref 12.0–15.0)
Potassium: 4.9 mEq/L (ref 3.5–5.1)
Sodium: 139 mEq/L (ref 135–145)
TCO2: 23 mmol/L (ref 0–100)

## 2012-07-07 LAB — GLUCOSE, CAPILLARY
Glucose-Capillary: 122 mg/dL — ABNORMAL HIGH (ref 70–99)
Glucose-Capillary: 105 mg/dL — ABNORMAL HIGH (ref 70–99)
Glucose-Capillary: 94 mg/dL (ref 70–99)
Glucose-Capillary: 101 mg/dL — ABNORMAL HIGH (ref 70–99)
Glucose-Capillary: 90 mg/dL (ref 70–99)
Glucose-Capillary: 101 mg/dL — ABNORMAL HIGH (ref 70–99)

## 2012-07-07 LAB — CBC
HCT: 29.4 % — ABNORMAL LOW (ref 36.0–46.0)
Hemoglobin: 10.3 g/dL — ABNORMAL LOW (ref 12.0–15.0)
MCH: 30.8 pg (ref 26.0–34.0)
MCHC: 35 g/dL (ref 30.0–36.0)
MCV: 87.8 fL (ref 78.0–100.0)
MCV: 88 fL (ref 78.0–100.0)
Platelets: 90 10*3/uL — ABNORMAL LOW (ref 150–400)
Platelets: 85 10*3/uL — ABNORMAL LOW (ref 150–400)
RBC: 3.76 MIL/uL — ABNORMAL LOW (ref 3.87–5.11)
RBC: 3.34 MIL/uL — ABNORMAL LOW (ref 3.87–5.11)
RDW: 15.3 % (ref 11.5–15.5)
RDW: 15.9 % — ABNORMAL HIGH (ref 11.5–15.5)
WBC: 9.5 10*3/uL (ref 4.0–10.5)
WBC: 12 10*3/uL — ABNORMAL HIGH (ref 4.0–10.5)

## 2012-07-07 LAB — HEMOGLOBIN AND HEMATOCRIT, BLOOD
HCT: 28.6 % — ABNORMAL LOW (ref 36.0–46.0)
Hemoglobin: 10.2 g/dL — ABNORMAL LOW (ref 12.0–15.0)

## 2012-07-07 LAB — MAGNESIUM: Magnesium: 3.3 mg/dL — ABNORMAL HIGH (ref 1.5–2.5)

## 2012-07-07 LAB — PROTIME-INR: Prothrombin Time: 15.4 seconds — ABNORMAL HIGH (ref 11.6–15.2)

## 2012-07-07 LAB — PLATELET COUNT: Platelets: 42 10*3/uL — ABNORMAL LOW (ref 150–400)

## 2012-07-07 LAB — CREATININE, SERUM
Creatinine, Ser: 0.95 mg/dL (ref 0.50–1.10)
GFR calc Af Amer: 68 mL/min — ABNORMAL LOW (ref >90)
GFR calc non Af Amer: 58 mL/min — ABNORMAL LOW (ref >90)

## 2012-07-07 LAB — APTT: aPTT: 38 seconds — ABNORMAL HIGH (ref 24–37)

## 2012-07-07 SURGERY — REPLACEMENT, AORTA, ASCENDING
Anesthesia: General | Site: Chest | Wound class: Clean

## 2012-07-07 MED ORDER — BISACODYL 10 MG RE SUPP: 10.0000 mg | Freq: Every day | RECTAL | Status: DC

## 2012-07-07 MED ORDER — LACTATED RINGERS IV SOLN
INTRAVENOUS | Status: DC
  Administered 2012-07-07: 20 mL/h via INTRAVENOUS

## 2012-07-07 MED ORDER — METOPROLOL TARTRATE 25 MG/10 ML ORAL SUSPENSION
12.5000 mg | Freq: Two times a day (BID) | ORAL | Status: DC
  Filled 2012-07-07 (×3): qty 5

## 2012-07-07 MED ORDER — MORPHINE SULFATE 2 MG/ML IJ SOLN
1.0000 mg | INTRAMUSCULAR | Status: AC | PRN
  Administered 2012-07-07: 2 mg via INTRAVENOUS
  Filled 2012-07-07: qty 1

## 2012-07-07 MED ORDER — PROTAMINE SULFATE 10 MG/ML IV SOLN
INTRAVENOUS | Status: DC | PRN
  Administered 2012-07-07: 50 mg via INTRAVENOUS
  Administered 2012-07-07: 30 mg via INTRAVENOUS

## 2012-07-07 MED ORDER — SODIUM CHLORIDE 0.9 % IV SOLN
INTRAVENOUS | Status: DC
Start: 2012-07-07 — End: 2012-07-10
  Administered 2012-07-07: 20 mL/h via INTRAVENOUS
  Administered 2012-07-09: 05:00:00 via INTRAVENOUS

## 2012-07-07 MED ORDER — OCUVITE PO TABS
1.0000 | ORAL_TABLET | Freq: Every day | ORAL | Status: DC
  Administered 2012-07-08 – 2012-07-19 (×11): 1 via ORAL
  Filled 2012-07-07 (×12): qty 1

## 2012-07-07 MED ORDER — MIDAZOLAM HCL 5 MG/5ML IJ SOLN
INTRAMUSCULAR | Status: DC | PRN
  Administered 2012-07-07 (×3): 2 mg via INTRAVENOUS

## 2012-07-07 MED ORDER — SODIUM CHLORIDE 0.9 % IV SOLN
INTRAVENOUS | Status: DC
  Administered 2012-07-07: 1 [IU]/h via INTRAVENOUS
  Administered 2012-07-07: 4 [IU]/h via INTRAVENOUS
  Filled 2012-07-07: qty 1

## 2012-07-07 MED ORDER — LACTATED RINGERS IV SOLN
INTRAVENOUS | Status: DC | PRN
  Administered 2012-07-07 (×2): via INTRAVENOUS

## 2012-07-07 MED ORDER — SODIUM CHLORIDE 0.9 % IV SOLN
20.0000 ug | INTRAVENOUS | Status: DC
  Filled 2012-07-07: qty 5

## 2012-07-07 MED ORDER — SODIUM BICARBONATE 4.2 % IV SOLN
INTRAVENOUS | Status: DC | PRN
  Administered 2012-07-07: 25 mL via INTRAVENOUS

## 2012-07-07 MED ORDER — ALBUMIN HUMAN 5 % IV SOLN
250.0000 mL | INTRAVENOUS | Status: AC | PRN
  Administered 2012-07-07 – 2012-07-08 (×3): 250 mL via INTRAVENOUS
  Filled 2012-07-07: qty 250

## 2012-07-07 MED ORDER — ACETAMINOPHEN 160 MG/5ML PO SOLN
975.0000 mg | Freq: Four times a day (QID) | ORAL | Status: AC
  Filled 2012-07-07: qty 40.6

## 2012-07-07 MED ORDER — SODIUM CHLORIDE 0.9 % IJ SOLN
OROMUCOSAL | Status: DC | PRN
  Administered 2012-07-07 (×3): via TOPICAL

## 2012-07-07 MED ORDER — PANTOPRAZOLE SODIUM 40 MG PO TBEC
40.0000 mg | DELAYED_RELEASE_TABLET | Freq: Every day | ORAL | Status: DC
  Administered 2012-07-09 – 2012-07-19 (×10): 40 mg via ORAL
  Filled 2012-07-07 (×9): qty 1

## 2012-07-07 MED ORDER — SODIUM CHLORIDE 0.9 % IJ SOLN
3.0000 mL | Freq: Two times a day (BID) | INTRAMUSCULAR | Status: DC
  Administered 2012-07-08 – 2012-07-16 (×11): 3 mL via INTRAVENOUS

## 2012-07-07 MED ORDER — LACTATED RINGERS IV SOLN: 500.0000 mL | Freq: Once | INTRAVENOUS | Status: AC | PRN

## 2012-07-07 MED ORDER — ROCURONIUM BROMIDE 100 MG/10ML IV SOLN
INTRAVENOUS | Status: DC | PRN
  Administered 2012-07-07 (×2): 30 mg via INTRAVENOUS
  Administered 2012-07-07: 60 mg via INTRAVENOUS
  Administered 2012-07-07: 30 mg via INTRAVENOUS

## 2012-07-07 MED ORDER — HEMOSTATIC AGENTS (NO CHARGE) OPTIME
TOPICAL | Status: DC | PRN
  Administered 2012-07-07: 1 via TOPICAL

## 2012-07-07 MED ORDER — BISACODYL 5 MG PO TBEC
10.0000 mg | DELAYED_RELEASE_TABLET | Freq: Every day | ORAL | Status: DC
  Administered 2012-07-08 – 2012-07-11 (×4): 10 mg via ORAL
  Filled 2012-07-07 (×5): qty 2

## 2012-07-07 MED ORDER — POLYETHYL GLYCOL-PROPYL GLYCOL 0.4-0.3 % OP SOLN: 1.0000 [drp] | Freq: Every day | OPHTHALMIC | Status: DC

## 2012-07-07 MED ORDER — ACETAMINOPHEN 500 MG PO TABS
1000.0000 mg | ORAL_TABLET | Freq: Four times a day (QID) | ORAL | Status: AC
  Administered 2012-07-09 (×2): 1000 mg via ORAL
  Filled 2012-07-07 (×19): qty 2

## 2012-07-07 MED ORDER — FENTANYL CITRATE 0.05 MG/ML IJ SOLN
INTRAMUSCULAR | Status: DC | PRN
  Administered 2012-07-07 (×2): 100 ug via INTRAVENOUS
  Administered 2012-07-07: 700 ug via INTRAVENOUS
  Administered 2012-07-07: 100 ug via INTRAVENOUS

## 2012-07-07 MED ORDER — POTASSIUM CHLORIDE 10 MEQ/50ML IV SOLN
10.0000 meq | INTRAVENOUS | Status: AC
  Administered 2012-07-07 (×3): 10 meq via INTRAVENOUS

## 2012-07-07 MED ORDER — POTASSIUM CHLORIDE 10 MEQ/50ML IV SOLN
10.0000 meq | INTRAVENOUS | Status: DC | PRN
  Administered 2012-07-07 – 2012-07-10 (×3): 10 meq via INTRAVENOUS
  Filled 2012-07-07: qty 50
  Filled 2012-07-07: qty 150

## 2012-07-07 MED ORDER — METOPROLOL TARTRATE 1 MG/ML IV SOLN
2.5000 mg | INTRAVENOUS | Status: DC | PRN
  Administered 2012-07-13: 5 mg via INTRAVENOUS
  Filled 2012-07-07: qty 5

## 2012-07-07 MED ORDER — MIDAZOLAM HCL 2 MG/2ML IJ SOLN: 2.0000 mg | INTRAMUSCULAR | Status: DC | PRN

## 2012-07-07 MED ORDER — PHENYLEPHRINE HCL 10 MG/ML IJ SOLN
0.0000 ug/min | INTRAVENOUS | Status: DC
  Administered 2012-07-07: 5 ug/min via INTRAVENOUS
  Administered 2012-07-07: 10 ug/min via INTRAVENOUS
  Administered 2012-07-09: 20 ug/min via INTRAVENOUS
  Administered 2012-07-09: 25 ug/min via INTRAVENOUS
  Filled 2012-07-07 (×4): qty 2

## 2012-07-07 MED ORDER — METOPROLOL TARTRATE 12.5 MG HALF TABLET
12.5000 mg | ORAL_TABLET | Freq: Two times a day (BID) | ORAL | Status: DC
  Filled 2012-07-07 (×3): qty 1

## 2012-07-07 MED ORDER — LACTATED RINGERS IV SOLN
INTRAVENOUS | Status: DC | PRN
  Administered 2012-07-07 (×3): via INTRAVENOUS

## 2012-07-07 MED ORDER — FAMOTIDINE IN NACL 20-0.9 MG/50ML-% IV SOLN
20.0000 mg | Freq: Two times a day (BID) | INTRAVENOUS | Status: AC
  Administered 2012-07-07: 20 mg via INTRAVENOUS

## 2012-07-07 MED ORDER — DEXTROSE 5 % IV SOLN
1.5000 g | Freq: Two times a day (BID) | INTRAVENOUS | Status: AC
  Administered 2012-07-07 – 2012-07-09 (×4): 1.5 g via INTRAVENOUS
  Filled 2012-07-07 (×4): qty 1.5

## 2012-07-07 MED ORDER — ASPIRIN EC 325 MG PO TBEC
325.0000 mg | DELAYED_RELEASE_TABLET | Freq: Every day | ORAL | Status: DC
  Administered 2012-07-08 – 2012-07-09 (×2): 325 mg via ORAL
  Filled 2012-07-07 (×2): qty 1

## 2012-07-07 MED ORDER — POLYVINYL ALCOHOL 1.4 % OP SOLN
1.0000 [drp] | Freq: Every day | OPHTHALMIC | Status: DC | PRN
  Filled 2012-07-07: qty 15

## 2012-07-07 MED ORDER — DEXMEDETOMIDINE HCL IN NACL 200 MCG/50ML IV SOLN
0.1000 ug/kg/h | INTRAVENOUS | Status: DC
  Administered 2012-07-07: 0.6 ug/kg/h via INTRAVENOUS

## 2012-07-07 MED ORDER — HEPARIN SODIUM (PORCINE) 1000 UNIT/ML IJ SOLN
INTRAMUSCULAR | Status: DC | PRN
  Administered 2012-07-07: 9000 [IU] via INTRAVENOUS
  Administered 2012-07-07: 5000 [IU] via INTRAVENOUS
  Administered 2012-07-07: 9000 [IU] via INTRAVENOUS

## 2012-07-07 MED ORDER — MAGNESIUM SULFATE 40 MG/ML IJ SOLN
4.0000 g | Freq: Once | INTRAMUSCULAR | Status: AC
  Administered 2012-07-07: 4 g via INTRAVENOUS
  Filled 2012-07-07: qty 100

## 2012-07-07 MED ORDER — MORPHINE SULFATE 2 MG/ML IJ SOLN
2.0000 mg | INTRAMUSCULAR | Status: DC | PRN
  Administered 2012-07-08 (×2): 2 mg via INTRAVENOUS
  Filled 2012-07-07 (×2): qty 1

## 2012-07-07 MED ORDER — DOPAMINE-DEXTROSE 3.2-5 MG/ML-% IV SOLN
0.0000 ug/kg/min | INTRAVENOUS | Status: DC
  Administered 2012-07-07: 5 ug/kg/min via INTRAVENOUS

## 2012-07-07 MED ORDER — VANCOMYCIN HCL IN DEXTROSE 1-5 GM/200ML-% IV SOLN
1000.0000 mg | Freq: Once | INTRAVENOUS | Status: DC
  Filled 2012-07-07: qty 200

## 2012-07-07 MED ORDER — LEVALBUTEROL HCL 1.25 MG/0.5ML IN NEBU
1.2500 mg | INHALATION_SOLUTION | Freq: Three times a day (TID) | RESPIRATORY_TRACT | Status: DC
  Administered 2012-07-07 – 2012-07-13 (×16): 1.25 mg via RESPIRATORY_TRACT
  Filled 2012-07-07 (×22): qty 0.5

## 2012-07-07 MED ORDER — DOPAMINE-DEXTROSE 3.2-5 MG/ML-% IV SOLN
INTRAVENOUS | Status: DC | PRN
  Administered 2012-07-07: 3 ug/kg/min via INTRAVENOUS

## 2012-07-07 MED ORDER — ACETAMINOPHEN 10 MG/ML IV SOLN
1000.0000 mg | Freq: Once | INTRAVENOUS | Status: AC
  Administered 2012-07-07: 1000 mg via INTRAVENOUS
  Filled 2012-07-07: qty 100

## 2012-07-07 MED ORDER — VANCOMYCIN HCL IN DEXTROSE 1-5 GM/200ML-% IV SOLN
1000.0000 mg | Freq: Two times a day (BID) | INTRAVENOUS | Status: AC
  Administered 2012-07-07 – 2012-07-09 (×4): 1000 mg via INTRAVENOUS
  Filled 2012-07-07 (×4): qty 200

## 2012-07-07 MED ORDER — PROPOFOL 10 MG/ML IV BOLUS
INTRAVENOUS | Status: DC | PRN
  Administered 2012-07-07: 40 mg via INTRAVENOUS
  Administered 2012-07-07: 50 mg via INTRAVENOUS
  Administered 2012-07-07: 40 mg via INTRAVENOUS

## 2012-07-07 MED ORDER — BUDESONIDE-FORMOTEROL FUMARATE 160-4.5 MCG/ACT IN AERO
2.0000 | INHALATION_SPRAY | Freq: Two times a day (BID) | RESPIRATORY_TRACT | Status: DC
  Administered 2012-07-07 – 2012-07-19 (×24): 2 via RESPIRATORY_TRACT
  Filled 2012-07-07: qty 6

## 2012-07-07 MED ORDER — DOCUSATE SODIUM 100 MG PO CAPS
200.0000 mg | ORAL_CAPSULE | Freq: Every day | ORAL | Status: DC
  Administered 2012-07-08 – 2012-07-14 (×7): 200 mg via ORAL
  Filled 2012-07-07 (×8): qty 2

## 2012-07-07 MED ORDER — INSULIN ASPART 100 UNIT/ML ~~LOC~~ SOLN
0.0000 [IU] | SUBCUTANEOUS | Status: AC
  Administered 2012-07-07 – 2012-07-08 (×2): 2 [IU] via SUBCUTANEOUS

## 2012-07-07 MED ORDER — TRAMADOL HCL 50 MG PO TABS
50.0000 mg | ORAL_TABLET | Freq: Four times a day (QID) | ORAL | Status: DC | PRN
  Administered 2012-07-08 – 2012-07-18 (×15): 50 mg via ORAL
  Filled 2012-07-07 (×16): qty 1

## 2012-07-07 MED ORDER — CHLORHEXIDINE GLUCONATE 4 % EX LIQD: 30.0000 mL | CUTANEOUS | Status: DC

## 2012-07-07 MED ORDER — SODIUM CHLORIDE 0.9 % IR SOLN
Status: DC | PRN
  Administered 2012-07-07: 6000 mL

## 2012-07-07 MED ORDER — INSULIN ASPART 100 UNIT/ML ~~LOC~~ SOLN
0.0000 [IU] | SUBCUTANEOUS | Status: DC
  Administered 2012-07-08 – 2012-07-09 (×7): 2 [IU] via SUBCUTANEOUS

## 2012-07-07 MED ORDER — NITROGLYCERIN IN D5W 200-5 MCG/ML-% IV SOLN
0.0000 ug/min | INTRAVENOUS | Status: DC
  Administered 2012-07-07: 10 ug/min via INTRAVENOUS

## 2012-07-07 MED ORDER — METOCLOPRAMIDE HCL 5 MG/ML IJ SOLN
10.0000 mg | Freq: Four times a day (QID) | INTRAMUSCULAR | Status: AC
  Administered 2012-07-07 – 2012-07-08 (×4): 10 mg via INTRAVENOUS
  Filled 2012-07-07 (×8): qty 2

## 2012-07-07 MED ORDER — TRAMADOL HCL 50 MG PO TABS: 50.0000 mg | ORAL_TABLET | ORAL | Status: DC | PRN

## 2012-07-07 MED ORDER — ARTIFICIAL TEARS OP OINT
TOPICAL_OINTMENT | OPHTHALMIC | Status: DC | PRN
  Administered 2012-07-07: 1 via OPHTHALMIC

## 2012-07-07 MED ORDER — SODIUM CHLORIDE 0.45 % IV SOLN
INTRAVENOUS | Status: DC
  Administered 2012-07-07: 20 mL/h via INTRAVENOUS

## 2012-07-07 MED ORDER — ASPIRIN 81 MG PO CHEW: 324.0000 mg | CHEWABLE_TABLET | Freq: Every day | ORAL | Status: DC

## 2012-07-07 MED ORDER — SODIUM CHLORIDE 0.9 % IJ SOLN
3.0000 mL | INTRAMUSCULAR | Status: DC | PRN
  Administered 2012-07-09: 3 mL via INTRAVENOUS

## 2012-07-07 MED ORDER — ONDANSETRON HCL 4 MG/2ML IJ SOLN
4.0000 mg | Freq: Four times a day (QID) | INTRAMUSCULAR | Status: DC | PRN
  Administered 2012-07-07 – 2012-07-16 (×6): 4 mg via INTRAVENOUS
  Filled 2012-07-07 (×6): qty 2

## 2012-07-07 MED ORDER — LACTATED RINGERS IV SOLN
INTRAVENOUS | Status: DC | PRN
  Administered 2012-07-07: 07:00:00 via INTRAVENOUS

## 2012-07-07 MED ORDER — INSULIN REGULAR BOLUS VIA INFUSION
0.0000 [IU] | Freq: Three times a day (TID) | INTRAVENOUS | Status: DC
  Filled 2012-07-07: qty 10

## 2012-07-07 MED ORDER — NOREPINEPHRINE BITARTRATE 1 MG/ML IJ SOLN
2.0000 ug/min | INTRAVENOUS | Status: DC
  Filled 2012-07-07: qty 4

## 2012-07-07 MED ORDER — SODIUM CHLORIDE 0.9 % IV SOLN: 250.0000 mL | INTRAVENOUS | Status: DC

## 2012-07-07 SURGICAL SUPPLY — 99 items
ADAPTER CARDIO PERF ANTE/RETRO (ADAPTER) ×4 IMPLANT
ADH SRG 12 PREFL SYR 3 SPRDR (MISCELLANEOUS)
ADPR PRFSN 84XANTGRD RTRGD (ADAPTER) ×2
ATTRACTOMAT 16X20 MAGNETIC DRP (DRAPES) ×4 IMPLANT
BAG DECANTER FOR FLEXI CONT (MISCELLANEOUS) ×4 IMPLANT
BLADE STERNUM SYSTEM 6 (BLADE) ×2 IMPLANT
BLADE SURG 15 STRL LF DISP TIS (BLADE) IMPLANT
BLADE SURG 15 STRL SS (BLADE) ×4
CANISTER SUCTION 2500CC (MISCELLANEOUS) ×4 IMPLANT
CANN PRFSN 3/8XRT ANG TPR 14 (MISCELLANEOUS) ×2
CANNULA GUNDRY RCSP 15FR (MISCELLANEOUS) ×4 IMPLANT
CANNULA PRFSN 3/8XRT ANG TPR14 (MISCELLANEOUS) IMPLANT
CANNULA SOFTFLOW AORTIC 7M21FR (CANNULA) ×2 IMPLANT
CANNULA VEN MTL TIP RT (MISCELLANEOUS) ×4
CANNULA VENNOUS METAL TIP 20FR (CANNULA) ×2 IMPLANT
CANNULA VRC MALB SNGL STG 28FR (MISCELLANEOUS) IMPLANT
CATH RETROPLEGIA CORONARY 14FR (CATHETERS) ×4 IMPLANT
CATH ROBINSON RED A/P 18FR (CATHETERS) ×8 IMPLANT
CATH/SQUID NICHOLS JEHLE COR (CATHETERS) ×2 IMPLANT
CAUTERY EYE LOW TEMP 1300F FIN (OPHTHALMIC RELATED) ×4 IMPLANT
CLIP FOGARTY SPRING 6M (CLIP) IMPLANT
CLOTH BEACON ORANGE TIMEOUT ST (SAFETY) ×4 IMPLANT
CONN 1/2X1/2X1/2  BEN (MISCELLANEOUS) ×2
CONN 1/2X1/2X1/2 BEN (MISCELLANEOUS) IMPLANT
CONN 3/8X1/2 ST GISH (MISCELLANEOUS) ×4 IMPLANT
COVER SURGICAL LIGHT HANDLE (MISCELLANEOUS) ×8 IMPLANT
CRADLE DONUT ADULT HEAD (MISCELLANEOUS) ×4 IMPLANT
DRAPE CARDIOVASCULAR INCISE (DRAPES) ×4
DRAPE SLUSH MACHINE 52X66 (DRAPES) ×2 IMPLANT
DRAPE SLUSH/WARMER DISC (DRAPES) IMPLANT
DRAPE SRG 135X102X78XABS (DRAPES) ×2 IMPLANT
DRSG COVADERM 4X14 (GAUZE/BANDAGES/DRESSINGS) ×4 IMPLANT
ELECT CAUTERY BLADE 6.4 (BLADE) IMPLANT
ELECT REM PT RETURN 9FT ADLT (ELECTROSURGICAL) ×8
ELECTRODE REM PT RTRN 9FT ADLT (ELECTROSURGICAL) ×4 IMPLANT
GAUZE XEROFORM 5X9 LF (GAUZE/BANDAGES/DRESSINGS) ×4 IMPLANT
GOWN STRL NON-REIN LRG LVL3 (GOWN DISPOSABLE) ×16 IMPLANT
GRAFT WOVEN D/V 30DX30L (Vascular Products) ×2 IMPLANT
HEMOSTAT POWDER SURGIFOAM 1G (HEMOSTASIS) ×6 IMPLANT
HEMOSTAT SURGICEL 2X14 (HEMOSTASIS) ×2 IMPLANT
HEMOSTAT SURGICEL 2X4 FIBR (HEMOSTASIS) ×2 IMPLANT
INSERT FOGARTY XLG (MISCELLANEOUS) ×2 IMPLANT
KIT BASIN OR (CUSTOM PROCEDURE TRAY) ×4 IMPLANT
KIT PAIN CUSTOM (MISCELLANEOUS) IMPLANT
KIT ROOM TURNOVER OR (KITS) ×4 IMPLANT
KIT SUCTION CATH 14FR (SUCTIONS) ×2 IMPLANT
LEAD PACING MYOCARDI (MISCELLANEOUS) ×2 IMPLANT
LINE VENT (MISCELLANEOUS) ×2 IMPLANT
MARKER GRAFT CORONARY BYPASS (MISCELLANEOUS) IMPLANT
NS IRRIG 1000ML POUR BTL (IV SOLUTION) ×20 IMPLANT
PACK OPEN HEART (CUSTOM PROCEDURE TRAY) ×4 IMPLANT
PAD ARMBOARD 7.5X6 YLW CONV (MISCELLANEOUS) ×8 IMPLANT
PENCIL BUTTON HOLSTER BLD 10FT (ELECTRODE) IMPLANT
SET CARDIOPLEGIA MPS 5001102 (MISCELLANEOUS) ×2 IMPLANT
SPONGE GAUZE 4X4 12PLY (GAUZE/BANDAGES/DRESSINGS) ×6 IMPLANT
STOPCOCK 4 WAY LG BORE MALE ST (IV SETS) ×4 IMPLANT
SUCKER INTRACARDIAC WEIGHTED (SUCKER) ×2 IMPLANT
SURGIFLO TRUKIT (HEMOSTASIS) ×4 IMPLANT
SUT ETHIBON 2 0 V 52N 30 (SUTURE) ×4 IMPLANT
SUT ETHIBON EXCEL 2-0 V-5 (SUTURE) IMPLANT
SUT ETHIBOND 2 0 SH (SUTURE) ×8
SUT ETHIBOND 2 0 SH 36X2 (SUTURE) IMPLANT
SUT ETHIBOND 2 0 V4 (SUTURE) IMPLANT
SUT ETHIBOND 2 0V4 GREEN (SUTURE) IMPLANT
SUT ETHIBOND 4 0 RB 1 (SUTURE) IMPLANT
SUT ETHIBOND V-5 VALVE (SUTURE) IMPLANT
SUT PROLENE 3 0 SH 1 (SUTURE) IMPLANT
SUT PROLENE 3 0 SH DA (SUTURE) IMPLANT
SUT PROLENE 4 0 RB 1 (SUTURE) ×152
SUT PROLENE 4 0 SH DA (SUTURE) ×24 IMPLANT
SUT PROLENE 4-0 RB1 .5 CRCL 36 (SUTURE) IMPLANT
SUT PROLENE 5 0 C 1 36 (SUTURE) ×22 IMPLANT
SUT PROLENE 6 0 C 1 30 (SUTURE) ×10 IMPLANT
SUT SILK  1 MH (SUTURE) ×4
SUT SILK 1 MH (SUTURE) ×4 IMPLANT
SUT SILK 1 TIES 10X30 (SUTURE) ×6 IMPLANT
SUT SILK 2 0 (SUTURE) ×4
SUT SILK 2 0 SH CR/8 (SUTURE) ×10 IMPLANT
SUT SILK 2-0 18XBRD TIE 12 (SUTURE) ×2 IMPLANT
SUT SILK 3 0 SH CR/8 (SUTURE) ×4 IMPLANT
SUT SILK 4 0 (SUTURE) ×4
SUT SILK 4-0 18XBRD TIE 12 (SUTURE) ×2 IMPLANT
SUT STEEL 6MS V (SUTURE) IMPLANT
SUT TEM PAC WIRE 2 0 SH (SUTURE) ×16 IMPLANT
SUT VIC AB 1 CTX 36 (SUTURE)
SUT VIC AB 1 CTX36XBRD ANBCTR (SUTURE) IMPLANT
SUT VIC AB 2-0 CTX 27 (SUTURE) ×8 IMPLANT
SUT VIC AB 3-0 X1 27 (SUTURE) IMPLANT
SYR 10ML KIT SKIN ADHESIVE (MISCELLANEOUS) IMPLANT
SYSTEM SAHARA CHEST DRAIN ATS (WOUND CARE) ×6 IMPLANT
TAPE CLOTH SOFT 2X10 (GAUZE/BANDAGES/DRESSINGS) ×2 IMPLANT
TOWEL OR 17X24 6PK STRL BLUE (TOWEL DISPOSABLE) ×4 IMPLANT
TOWEL OR 17X26 10 PK STRL BLUE (TOWEL DISPOSABLE) ×4 IMPLANT
TRAY CATH LUMEN 1 20CM STRL (SET/KITS/TRAYS/PACK) ×2 IMPLANT
TRAY FOLEY IC TEMP SENS 14FR (CATHETERS) ×4 IMPLANT
TUBING ART PRESS 48 MALE/FEM (TUBING) ×4 IMPLANT
UNDERPAD 30X30 INCONTINENT (UNDERPADS AND DIAPERS) ×4 IMPLANT
VRC MALLEABLE SINGLE STG 28FR (MISCELLANEOUS) ×8
WATER STERILE IRR 1000ML POUR (IV SOLUTION) ×8 IMPLANT

## 2012-07-07 NOTE — Progress Notes (Signed)
Pt. Took her beta blocker this am prior to coming to hospital. Order for  Metoprolol not given due to pt. Taking her own medication.

## 2012-07-07 NOTE — Progress Notes (Signed)
The patient was examined and preop studies reviewed. There has been no change from the prior exam and the patient is ready for surgery.  Plan redo sternotomy, replace ascending aorta, possible redo AVR on P [place]

## 2012-07-07 NOTE — Anesthesia Postprocedure Evaluation (Signed)
Anesthesia Post Note  Patient: [name] [name]  Procedure(s) Performed: Procedure(s) (LRB): REPLACEMENT ASCENDING AORTA (N/A) REDO STERNOTOMY (N/A)  Anesthesia type: General  Patient location: ICU  Post pain: Pain level controlled  Post assessment: Post-op Vital signs reviewed  Last Vitals:  Filed Vitals:   07/07/12 0601  BP: 102/68  Pulse: 56  Temp: 36.5 C  Resp: 18    Post vital signs: stable  Level of consciousness: Patient remains intubated per anesthesia plan  Complications: No apparent anesthesia complications

## 2012-07-07 NOTE — Progress Notes (Signed)
TCTS   Redo replacement asc anuerysm Sedated on vent Stable hemodynamics Not bleeding

## 2012-07-07 NOTE — Transfer of Care (Signed)
Immediate Anesthesia Transfer of Care Note  Patient: [name] [name]  Procedure(s) Performed: Procedure(s) (LRB) with comments: REPLACEMENT ASCENDING AORTA (N/A) - CIRCULATORY ARREST REDO STERNOTOMY (N/A)  Patient Location: SICU  Anesthesia Type:General  Level of Consciousness: sedated, unresponsive and Patient remains intubated per anesthesia plan  Airway & Oxygen Therapy: Patient remains intubated per anesthesia plan, Patient placed on Ventilator (see vital sign flow sheet for setting) and was transported to SICU on ventilator with +5 PEEP and RT  in attendance  Post-op Assessment:Report to SICU RN, Remo Lipps  Post vital signs: Reviewed and stable  Complications: No apparent anesthesia complications

## 2012-07-07 NOTE — Procedures (Signed)
Extubation Procedure Note  Patient Details:   Name: [name] [name] DOB: [id number] MRN: [id number]   Airway Documentation:  Airway 7.5 mm (Active)  Secured at (cm) 19 cm 07/07/2012  7:55 PM  Measured From Lips 07/07/2012  7:55 PM  Secured Location Right 07/07/2012  7:55 PM  Secured By Pink Tape 07/07/2012  7:55 PM  Site Condition Dry 07/07/2012  3:00 PM    Evaluation  O2 sats: stable throughout Complications: No apparent complications Patient did tolerate procedure well. Bilateral Breath Sounds: Clear Suctioning: Airway Yes  Pt extubated per MD order.  Pt reached NIF parameters of -25 and VC od 2.5L.  Pt stable throughout procedure and was placed on a 2lpm [place] with huimidity.  No apparent complications at this time and pt is stable.  Kieren Adkison 07/07/2012, 9:01 PM

## 2012-07-07 NOTE — Anesthesia Preprocedure Evaluation (Addendum)
Anesthesia Evaluation  Patient identified by MRN, date of birth, ID band Patient awake    Reviewed: Allergy & Precautions, H&P , NPO status , Patient's Chart, lab work & pertinent test results, reviewed documented beta blocker date and time   Airway Mallampati: II TM Distance: <3 FB   Mouth opening: Limited Mouth Opening  Dental  (+) Teeth Intact, Caps and Dental Advisory Given   Pulmonary          Cardiovascular Exercise Tolerance: Good + Valvular Problems/Murmurs AI  Hx AVRepla ement 2004, now with ascending aortic aneurysm for replacement.   Neuro/Psych  Headaches, On daily Inderal for migraine prevention    GI/Hepatic GERD-  ,  Endo/Other    Renal/GU      Musculoskeletal   Abdominal   Peds  Hematology   Anesthesia Other Findings Receding chin.  Crowns top front teeth  Reproductive/Obstetrics                         Anesthesia Physical Anesthesia Plan  ASA: III  Anesthesia Plan: General   Post-op Pain Management:    Induction: Intravenous  Airway Management Planned: Oral ETT  Additional Equipment: Arterial line, PA Cath, TEE and Ultrasound Guidance Line Placement  Intra-op Plan:   Post-operative Plan: Post-operative intubation/ventilation  Informed Consent: I have reviewed the patients History and Physical, chart, labs and discussed the procedure including the risks, benefits and alternatives for the proposed anesthesia with the patient or authorized representative who has indicated his/her understanding and acceptance.     Plan Discussed with: CRNA and Surgeon  Anesthesia Plan Comments:         Anesthesia Quick Evaluation

## 2012-07-07 NOTE — OR Nursing (Signed)
2nd call to SICU, [name] [name] called both times.

## 2012-07-07 NOTE — Anesthesia Procedure Notes (Signed)
Procedure Name: Intubation Date/Time: 07/07/2012 7:57 AM Performed by: Terrill Mohr Pre-anesthesia Checklist: Patient identified, Emergency Drugs available, Suction available and Patient being monitored Patient Re-evaluated:Patient Re-evaluated prior to inductionOxygen Delivery Method: Circle system utilized Preoxygenation: Pre-oxygenation with 100% oxygen Intubation Type: IV induction Ventilation: Mask ventilation without difficulty Laryngoscope Size: Mac and 3 Grade View: Grade II Tube type: Oral Tube size: 7.5 mm Number of attempts: 1 Airway Equipment and Method: Stylet Placement Confirmation: ETT inserted through vocal cords under direct vision,  positive ETCO2 and breath sounds checked- equal and bilateral Secured at: 21 (cm at teeth) cm Tube secured with: Tape Dental Injury: Teeth and Oropharynx as per pre-operative assessment

## 2012-07-07 NOTE — Anesthesia Postprocedure Evaluation (Signed)
  Anesthesia Post-op Note  Patient: [name] [name]  Procedure(s) Performed: Procedure(s) (LRB) with comments: REPLACEMENT ASCENDING AORTA (N/A) - CIRCULATORY ARREST REDO STERNOTOMY (N/A)  Patient Location: SICU  Anesthesia Type:General  Level of Consciousness: sedated and Patient remains intubated per anesthesia plan  Airway and Oxygen Therapy: Patient placed on Ventilator (see vital sign flow sheet for setting)  Post-op Pain: none  Post-op Assessment: Post-op Vital signs reviewed, Patient's Cardiovascular Status Stable, Respiratory Function Stable, Patent Airway and No signs of Nausea or vomiting  Post-op Vital Signs: Reviewed and stable  Complications: No apparent anesthesia complications

## 2012-07-07 NOTE — Progress Notes (Signed)
  Echocardiogram Echocardiogram Transesophageal has been performed.  Mauricio Po 07/07/2012, 10:15 AM

## 2012-07-07 NOTE — OR Nursing (Signed)
1st call to SICU, volunteer desk for off pum,p and respiratory for ventilator in OR for transport to SICU.

## 2012-07-07 NOTE — Progress Notes (Signed)
PFT not in epic. Charge nurse, Shary Decamp, called respitory dept. Stated they were unable to retrieve them and the staff for that dept. Will arrive at 0630. Stated to call back at 0630.

## 2012-07-07 NOTE — Preoperative (Signed)
Beta Blockers   Reason not to administer Beta Blockers:Inderal 4 a.m. today (migraine prevention)

## 2012-07-07 NOTE — Brief Op Note (Signed)
07/07/2012  12:20 PM  PATIENT:  [name] [name]  71 y.o. female  PRE-OPERATIVE DIAGNOSIS:  ASCENDING THORACIC AORTIC FUSIFORM ANEURYSM  POST-OPERATIVE DIAGNOSIS:  ASCENDING THORACIC AORTIC FUSIFORM ANEURYSM  PROCEDURE:  REDO STERNOTOMY, REPLACEMENT ASCENDING THORACIC AORTA  Using a Hemashield graft size 30 mm with CIRCULATORY ARREST   SURGEON:  Surgeon(s) and Role:    * Ivin Poot, MD - Primary  PHYSICIAN ASSISTANT: Lars Pinks PA-C  ANESTHESIA:   general  EBL:  Total I/O In: 2000 [I.V.:2000] Out: 1000 [Urine:1000]  BLOOD ADMINISTERED:One  FFP and two packs of platelets  DRAINS:  Chest Tube(s) in the Mediastinal and Pleural spaces   SPECIMEN:  Source of Specimen:  Ascending thoracic aortic aneurysm  DISPOSITION OF SPECIMEN:  PATHOLOGY  COUNTS CORRECT:  YES  DICTATION: .Dragon Dictation  PLAN OF CARE: Admit to inpatient   PATIENT DISPOSITION:  ICU - intubated and hemodynamically stable.   Delay start of Pharmacological VTE agent (>24hrs) due to surgical blood loss or risk of bleeding: yes  PRE OP WEIGHT: 59 kg

## 2012-07-08 ENCOUNTER — Inpatient Hospital Stay (HOSPITAL_COMMUNITY): Payer: Medicare Other

## 2012-07-08 ENCOUNTER — Other Ambulatory Visit: Payer: Self-pay

## 2012-07-08 ENCOUNTER — Encounter (HOSPITAL_COMMUNITY): Payer: Self-pay | Admitting: Cardiothoracic Surgery

## 2012-07-08 LAB — CBC
HCT: 28.9 % — ABNORMAL LOW (ref 36.0–46.0)
Hemoglobin: 10.3 g/dL — ABNORMAL LOW (ref 12.0–15.0)
MCH: 31.3 pg (ref 26.0–34.0)
MCHC: 35.6 g/dL (ref 30.0–36.0)
MCHC: 34.8 g/dL (ref 30.0–36.0)
MCV: 87.8 fL (ref 78.0–100.0)
MCV: 89.3 fL (ref 78.0–100.0)
Platelets: 90 10*3/uL — ABNORMAL LOW (ref 150–400)
Platelets: 85 10*3/uL — ABNORMAL LOW (ref 150–400)
RBC: 3.29 MIL/uL — ABNORMAL LOW (ref 3.87–5.11)
RDW: 16.1 % — ABNORMAL HIGH (ref 11.5–15.5)
RDW: 16.4 % — ABNORMAL HIGH (ref 11.5–15.5)
WBC: 13.1 10*3/uL — ABNORMAL HIGH (ref 4.0–10.5)
WBC: 14.7 10*3/uL — ABNORMAL HIGH (ref 4.0–10.5)

## 2012-07-08 LAB — PREPARE FRESH FROZEN PLASMA
Unit division: 0
Unit division: 0
Unit division: 0

## 2012-07-08 LAB — CREATININE, SERUM
GFR calc Af Amer: 51 mL/min — ABNORMAL LOW (ref >90)
GFR calc non Af Amer: 44 mL/min — ABNORMAL LOW (ref >90)

## 2012-07-08 LAB — PREPARE PLATELET PHERESIS: Unit division: 0

## 2012-07-08 LAB — BASIC METABOLIC PANEL
Calcium: 6.8 mg/dL — ABNORMAL LOW (ref 8.4–10.5)
GFR calc non Af Amer: 55 mL/min — ABNORMAL LOW (ref >90)
Glucose, Bld: 126 mg/dL — ABNORMAL HIGH (ref 70–99)
Sodium: 138 mEq/L (ref 135–145)

## 2012-07-08 LAB — GLUCOSE, CAPILLARY
Glucose-Capillary: 107 mg/dL — ABNORMAL HIGH (ref 70–99)
Glucose-Capillary: 114 mg/dL — ABNORMAL HIGH (ref 70–99)
Glucose-Capillary: 118 mg/dL — ABNORMAL HIGH (ref 70–99)
Glucose-Capillary: 131 mg/dL — ABNORMAL HIGH (ref 70–99)
Glucose-Capillary: 153 mg/dL — ABNORMAL HIGH (ref 70–99)
Glucose-Capillary: 159 mg/dL — ABNORMAL HIGH (ref 70–99)

## 2012-07-08 LAB — POCT I-STAT, CHEM 8
BUN: 12 mg/dL (ref 6–23)
Calcium, Ion: 0.95 mmol/L — ABNORMAL LOW (ref 1.13–1.30)
Chloride: 100 mEq/L (ref 96–112)
Creatinine, Ser: 1.2 mg/dL — ABNORMAL HIGH (ref 0.50–1.10)
Glucose, Bld: 132 mg/dL — ABNORMAL HIGH (ref 70–99)
HCT: 26 % — ABNORMAL LOW (ref 36.0–46.0)
Hemoglobin: 8.8 g/dL — ABNORMAL LOW (ref 12.0–15.0)
Potassium: 4.4 mEq/L (ref 3.5–5.1)
Sodium: 138 mEq/L (ref 135–145)
TCO2: 25 mmol/L (ref 0–100)

## 2012-07-08 LAB — POCT I-STAT 3, ART BLOOD GAS (G3+)
Acid-base deficit: 1 mmol/L (ref 0.0–2.0)
Bicarbonate: 25.4 mEq/L — ABNORMAL HIGH (ref 20.0–24.0)
O2 Saturation: 93 %
Patient temperature: 36.8
TCO2: 27 mmol/L (ref 0–100)
pCO2 arterial: 46.7 mmHg — ABNORMAL HIGH (ref 35.0–45.0)
pH, Arterial: 7.342 — ABNORMAL LOW (ref 7.350–7.450)
pO2, Arterial: 72 mmHg — ABNORMAL LOW (ref 80.0–100.0)

## 2012-07-08 LAB — MAGNESIUM: Magnesium: 2.6 mg/dL — ABNORMAL HIGH (ref 1.5–2.5)

## 2012-07-08 MED ORDER — INSULIN GLARGINE 100 UNIT/ML ~~LOC~~ SOLN
12.0000 [IU] | Freq: Every day | SUBCUTANEOUS | Status: DC
  Administered 2012-07-08 – 2012-07-10 (×3): 12 [IU] via SUBCUTANEOUS

## 2012-07-08 MED ORDER — FUROSEMIDE 10 MG/ML IJ SOLN
20.0000 mg | Freq: Two times a day (BID) | INTRAMUSCULAR | Status: DC
  Administered 2012-07-08 – 2012-07-09 (×4): 20 mg via INTRAVENOUS
  Filled 2012-07-08 (×6): qty 2

## 2012-07-08 MED ORDER — AMIODARONE HCL IN DEXTROSE 360-4.14 MG/200ML-% IV SOLN
INTRAVENOUS | Status: AC
  Administered 2012-07-09: 60 mg/h via INTRAVENOUS
  Filled 2012-07-08: qty 200

## 2012-07-08 MED ORDER — INSULIN ASPART 100 UNIT/ML ~~LOC~~ SOLN: 0.0000 [IU] | SUBCUTANEOUS | Status: DC

## 2012-07-08 MED ORDER — AMIODARONE HCL IN DEXTROSE 360-4.14 MG/200ML-% IV SOLN
INTRAVENOUS | Status: AC
  Administered 2012-07-08: 200 mL via INTRAVENOUS
  Filled 2012-07-08: qty 200

## 2012-07-08 MED ORDER — SUMATRIPTAN SUCCINATE 50 MG PO TABS
50.0000 mg | ORAL_TABLET | Freq: Two times a day (BID) | ORAL | Status: DC | PRN
  Administered 2012-07-17 – 2012-07-18 (×2): 50 mg via ORAL
  Filled 2012-07-08 (×3): qty 1

## 2012-07-08 MED ORDER — FE FUMARATE-B12-VIT C-FA-IFC PO CAPS
1.0000 | ORAL_CAPSULE | Freq: Three times a day (TID) | ORAL | Status: DC
  Administered 2012-07-08 – 2012-07-19 (×32): 1 via ORAL
  Filled 2012-07-08 (×39): qty 1

## 2012-07-08 MED ORDER — PROMETHAZINE HCL 25 MG/ML IJ SOLN
12.5000 mg | Freq: Four times a day (QID) | INTRAMUSCULAR | Status: DC | PRN
  Administered 2012-07-18: 12.5 mg via INTRAVENOUS
  Filled 2012-07-08: qty 1

## 2012-07-08 MED FILL — Potassium Chloride Inj 2 mEq/ML: INTRAVENOUS | Qty: 40 | Status: AC

## 2012-07-08 MED FILL — Magnesium Sulfate Inj 50%: INTRAMUSCULAR | Qty: 10 | Status: AC

## 2012-07-08 NOTE — Progress Notes (Signed)
Pt. Seen and examined. Agree with the NP/PA-C note as written. S/p aortic root grafting for ascending aneurysm with preservation of a bioprosthetic aortic valve. The PA was patch repaired due to it being adherant to the aorta. She remains hypotensive, post-op. On dopamine at 3 mcg/kg/min. Pain seems to be controlled, she is conversant, alert and pleasant. Currently she is a-paced at 31, which is essentially overdrive pacing but may be supporting the blood pressure. Does not appear that dopamine can be weaned today. Will follow along with you.  Pixie Casino, MD, [facility] Attending Cardiologist [facility] [place]

## 2012-07-08 NOTE — Progress Notes (Signed)
[facility] BRIEF SICU PROGRESS NOTE  1 Day Post-Op  S/P Procedure(s) (LRB): REPLACEMENT ASCENDING AORTA (N/A) REDO STERNOTOMY (N/A)   Stable day NSR/AAI paced w/ stable BP O2 sats 95% on 4 L/min Diuresisng fairly well  Plan: Continue current plan  Rexene Alberts 07/08/2012 6:49 PM

## 2012-07-08 NOTE — Progress Notes (Addendum)
1 Day Post-Op Procedure(s) (LRB): REPLACEMENT ASCENDING AORTA (N/A) REDO STERNOTOMY (N/A) Subjective:  [name] states she feels bad this morning.  She complains of pain and developed nausea after eating her breakfast.    Objective: Vital signs in last 24 hours: Temp:  [93.6 F (34.2 C)-98.2 F (36.8 C)] 98.1 F (36.7 C) (12/04 0715) Pulse Rate:  [80-90] 90  (12/04 0715) Cardiac Rhythm:  [-] Atrial paced (12/04 0600) Resp:  [0-29] 16  (12/04 0715) BP: (80-131)/(46-77) 91/51 mmHg (12/04 0700) SpO2:  [89 %-100 %] 97 % (12/04 0756) Arterial Line BP: (100-146)/(53-89) 100/53 mmHg (12/04 0715) FiO2 (%):  [40 %-100 %] 45 % (12/04 0400) Weight:  [147 lb 14.9 oz (67.1 kg)] 147 lb 14.9 oz (67.1 kg) (12/04 0500)  Hemodynamic parameters for last 24 hours: PAP: (27-45)/(14-25) 31/14 mmHg CO:  [2.7 L/min-4.3 L/min] 4.1 L/min CI:  [1.7 L/min/m2-2.7 L/min/m2] 2.6 L/min/m2  Intake/Output from previous day: 12/03 0701 - 12/04 0700 In: 8553.7 [I.V.:5477.7; Blood:1600; NG/GT:30; IV Piggyback:1446] Out: [id number]; Emesis/NG output:50; Blood:1000; Chest Tube:790] Intake/Output this shift: Total I/O In: 2 [IV Piggyback:2] Out: -   General appearance: alert, cooperative and no distress Heart: regular rate and rhythm Lungs: clear to auscultation bilaterally Abdomen: soft, non-tender; bowel sounds normal; no masses,  no organomegaly Extremities: edema 1+ Wound: clean and dry  Lab Results:  Basename 07/08/12 0410 07/07/12 2203 07/07/12 2158  WBC 13.1* -- 12.0*  HGB 10.3* 9.5* --  HCT 28.9* 28.0* --  PLT 90* -- 85*   BMET:  Basename 07/08/12 0410 07/07/12 2203 07/06/12 1331  NA 138 139 --  K 4.4 4.9 --  CL 104 105 --  CO2 25 -- 21  GLUCOSE 126* 154* --  BUN 12 11 --  CREATININE 1.00 1.10 --  CALCIUM 6.8* -- 9.8    PT/INR:  Basename 07/07/12 1535  LABPROT 15.4*  INR 1.24   ABG    Component Value Date/Time   PHART 7.342* 07/08/2012 0353   HCO3 25.4* 07/08/2012 0353    TCO2 27 07/08/2012 0353   ACIDBASEDEF 1.0 07/08/2012 0353   O2SAT 93.0 07/08/2012 0353   CBG (last 3)   Basename 07/08/12 0751 07/08/12 0439 07/08/12 0002  GLUCAP 107* 118* 153*    Assessment/Plan: S/P Procedure(s) (LRB): REPLACEMENT ASCENDING AORTA (N/A) REDO STERNOTOMY (N/A)  1. CV- NSR, paced, pressure well controlled, wean Dopamine today 2. Pulm- on oxygen, CXR with minimal atelectasis in the bases, continue IS wean oxygen as tolerated 3. Chest tubes- will remove all but one tube today, nurse stated per PVT 4. Acute post operative anemia- stable H/H 10.3/28.9 5. Nausea- zofran/reglan prn 6. D/C Foley      LOS: 1 day    [name], [name] 07/08/2012   patient examined and medical record reviewed,agree with above note.  DC lines and OOB Hold B-blockers with HR 57 sinus [name],[name] 07/08/2012

## 2012-07-08 NOTE — Op Note (Signed)
[name] [name], [name] [name] NO.:  [id number]  MEDICAL RECORD NO.:  [id number]  LOCATION:  2305                         FACILITY:  [facility]  PHYSICIAN:  Ivin Poot, M.D.  DATE OF BIRTH:  02/20/1941  DATE OF PROCEDURE:  07/07/2012 DATE OF DISCHARGE:                              OPERATIVE REPORT   OPERATION: 1. Redo sternotomy. 2. Resection and grafting of ascending 5.5-cm fusiform thoracic aortic     aneurysm with a 30-mm Hemashield straight graft.  PREOPERATIVE DIAGNOSIS:  Ascending fusiform thoracic aortic aneurysm, status post prior aortic valve replacement 10 years ago for aortic stenosis.  POSTOPERATIVE DIAGNOSIS:  Ascending fusiform thoracic aortic aneurysm, status post prior aortic valve replacement 10 years ago for aortic stenosis.  SURGEON:  Ivin Poot, M.D.  ASSISTANT:  Lars Pinks, PA and Magda Kiel, RNFA  ANESTHESIA:  General by Dr. Lillia Abed.  INDICATIONS:  The patient is a 71 year old female status post bioprosthetic AVR 10 years ago, who presents with some atypical chest pain and normal coronary arteries.  Her ascending aorta was dilated on cardiac cath and the CT scan documented this to be a 5.5-cm fusiform aneurysm without evidence of hematoma or dissection.  An echo and a transesophageal echo showed the aortic valve would be functioning normally without aortic insufficiency or gradient.  She was referred for repair of her ascending fusiform aneurysm and possible redo aortic valve replacement depending on examination of the valve intraoperatively.  Prior to her surgery, I examined the patient in the office and reviewed the results of her studies with her husband and the patient.  I discussed the indications and expected benefits of resection and grafting of her large ascending aneurysm including prevention of acute aortic dissection or rupture.  We discussed the major details of surgery including the use of general  anesthesia, location of the surgical incisions, the use of cardiopulmonary bypass and possible hypothermic circulatory arrest, an expected postop recovery as well as the potential risks including risks of bleeding, the need for blood transfusion, risk of infection, stroke, MI, and death.  She understands these issues and agreed to surgery under what I felt was an informed consent.  OPERATIVE PROCEDURE:  The patient was brought to the operating room and placed in supine on the operating table where general anesthesia was induced.  A transesophageal echo probe was placed by the anesthesiologist.  This confirmed the preoperative diagnosis of an ascending aneurysm with a normal functioning bioprosthetic valve.  Good LV function was noted.  The patient was prepped and draped as a sterile field.  A proper time- out was performed.  A left femoral A-line was placed for blood pressure monitoring as the radial A-line was somewhat dampened.  A redo sternotomy was performed using the oscillating saw.  Care was taken to avoid injury to the underlying vascular structures.  The ascending aorta was very large and just underneath the sternum.  The right atrium was pushed posteriorly.  The anterior mediastinum was dissected of the ascending aorta, right ventricle, right atrium, and the inferior surface of the left ventricle.  The lateral left ventricle was not fully dissected.  Heparin was administered.  The aneurysm had a neck  at the innominate vessels and the decision was made to perform the repair using a crossclamp technique and sewing the graft just proximal to the innominate vessels.  The aortic arch was cannulated with a long flexible aortic cannula and the right atrium was doubly cannulated within the SVC and IVC.  The superior vein was very scarred and posteriorly located and was not used for a LV vent.  The patient was then cannulated in the arch and bicaval cannulas were placed, and the  patient was placed on cardiopulmonary bypass.  The remainder of the dissection was performed on the ascending aorta so that the crossclamp could be placed. Cardioplegia catheters were placed in the ascending aorta to be resected later and as well as in the left coronary sinus with pursestring in the right atrium.  The patient was cooled to 28 degrees and the aortic cross- clamp was carefully placed just proximal to the innominate artery. Cardioplegic arrest was achieved with 800 mL of cold blood cardioplegia, directed in the split doses between the antegrade aortic and retrograde coronary sinus catheters.  There was good cardioplegic arrest and septal temperature dropped less than 90 degrees.  Cardioplegia was delivered every 20 minutes or less while the crossclamp was in place.  The aorta was carefully dissected off the pulmonary artery.  The pulmonary artery was adherent to the aneurysm and a small opening in the pulmonary artery was repaired with pledgeted Prolene sutures.  Actually, a vent was placed in the pulmonary artery before the sutures were tied and they were kept on a keeper around the vent.  The aorta was carefully dissected from the mediastinum, it was resected just above the level of the coronaries.  There was a very thinned out area, which had not developed an intimal flap or tear, but was certainly at high risk for rupture of tear in the near future.  The aorta was trimmed just below the clamp as well as just above the coronary ostia. It was of good quality.  The aortic valve was carefully inspected.  The leaflets were not thickened.  There was no evidence of degeneration.  The commissures were not fused.  The leaflets were open and closed freely.  It was decided not to replace the valve.  The aortic size just above the coronaries was size of 30-mm valve sizer. A 30-mm Dacron Hemashield graft was selected, lot number [id number] and catalog number [id number].  It was sewn  end-to-end to the proximal aorta at the sinotubular junction using first a running 4-0 Prolene and then reinforced with interrupted 4-0 pledgeted Prolene sutures around the posterior aspect of the anastomosis, which were all tied inside the vessel and then as the anterior aspect of the running anastomosis was completed more, interrupted 4-0 pledgeted sutures were placed around the anterior aspect for reinforcement.  The proximal anastomosis was coded with a small layer of medical adhesive - FloSeal.  Graft was then oriented and stretched to the appropriate length for the distal anastomosis and the graft was cut with a bevel to the patient's right.  Next, the outflow or distal anastomosis was constructed first with a running 4-0 Prolene around the posterior aspect of the anastomosis.  Within the anastomosis, interrupted 4-0 pledgeted Prolenes were placed to reinforce the posterior anastomosis and tied.  The running anastomosis was then completed around the anterior aspect and this was tied.  The anterior aspect was again reinforced with interrupted 4-0 Prolene pledgeted sutures.  The patient had been rewarmed and a  dose of retrograde warm blood cardioplegia was used to remove any intracoronary air.  The operative field had also been insufflated with carbon dioxide.  The crossclamp was then removed, and a vent was placed in the graft using a pledgeted Prolene suture with a Therapist, nutritional.  The patient was rewarmed and reperfused.  The proximal and distal aortic anastomoses were checked and found to be hemostatic.  There was some bleeding from the pulmonary artery where the initial repair had been made where it had been adherent to the aneurysm, this was repaired with an additional 5-0 pledgeted Prolene suture carefully.  Care was taken to make sure the Swan catheter and the pulmonary artery was not entrapped by the suture.  Pursestrings were placed.  The patient was rewarmed.  The lungs  were expanded and ventilator was resumed.  The patient was then weaned off bypass and atrial paced rhythm, and low-dose dopamine.  Protamine was initially administered; however, after the test dose, there was a drop of blood pressure and oxygenation.  The patient was then given steroids and volume, and was carefully watched and the lungs recruited with improved oxygenation and blood pressure improved.  We did not have to go on bypass.  We did not give any more protamine however.  FFP was ordered.  The cannulas were removed.  There was no specific site of bleeding, but diffuse oozing from the mediastinum and sternal bone. This improved with platelet transfusion after platelet count returned at 40,000.  Anterior mediastinal and posterior mediastinal chest tubes were placed as well as bilateral pleural tubes.  They were secured to the skin.  The patient remained stable at this point with improved oxygenation after recruitment maneuvers were performed.  The sternum was closed with interrupted wire.  The pectoralis fascia was closed with a running #1 Vicryl.  The subcutaneous and skin layers were closed with running Vicryl, and sterile dressings were applied.  Total cardiopulmonary bypass time was 150 minutes.     Ivin Poot, M.D.     PV/MEDQ  D:  07/07/2012  T:  07/08/2012  Job:  [id number]  cc:   Quay Burow, M.D.

## 2012-07-08 NOTE — Progress Notes (Signed)
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  Her last myoview was March 2012 and normal.  Recent 2D echo revealed normal LVF, moderate asymmetric left vent hypertrophy, well functioning aortic prosthesis with valve area of 1.1cm^2.  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  She presented for replacement ascending aorta and redo sternotomy.   Subjective: She had a rough night.  No SOB.  Pain controlled.  Objective: Vital signs in last 24 hours: Temp:  [93.6 F (34.2 C)-98.2 F (36.8 C)] 98.1 F (36.7 C) (12/04 0800) Pulse Rate:  [80-90] 88  (12/04 0900) Resp:  [0-29] 16  (12/04 0900) BP: (80-131)/(46-77) 81/48 mmHg (12/04 0900) SpO2:  [89 %-100 %] 100 % (12/04 0900) Arterial Line BP: (100-146)/(53-89) 105/58 mmHg (12/04 0800) FiO2 (%):  [40 %-100 %] 45 % (12/04 0400) Weight:  [67.1 kg (147 lb 14.9 oz)] 67.1 kg (147 lb 14.9 oz) (12/04 0500)    Intake/Output from previous day: 12/03 0701 - 12/04 0700 In: 8553.7 [I.V.:5477.7; Blood:1600; NG/GT:30; IV Piggyback:1446] Out: [id number]; Emesis/NG output:50; Blood:1000; Chest Tube:790] Intake/Output this shift: Total I/O In: 241.3 [P.O.:120; I.V.:69.3; IV Piggyback:52] Out: 230 [Urine:105; Chest Tube:125]  Medications Current Facility-Administered Medications  Medication Dose Route Frequency Provider Last Rate Last Dose  . 0.45 % sodium chloride infusion   Intravenous Continuous Nani Skillern, PA 20 mL/hr at 07/07/12 1500 20 mL/hr at 07/07/12 1500  . 0.9 %  sodium chloride infusion   Intravenous Continuous Nani Skillern, PA 20 mL/hr at 07/07/12 1500 20 mL/hr at 07/07/12 1500  . 0.9 %  sodium chloride infusion  250 mL Intravenous Continuous Nani Skillern, PA 1 mL/hr at 07/08/12 0600 250 mL at 07/08/12 0600  . [COMPLETED] acetaminophen (OFIRMEV) IV 1,000 mg   1,000 mg Intravenous Once Nani Skillern, PA   1,000 mg at 07/07/12 2100  . acetaminophen (TYLENOL) tablet 1,000 mg  1,000 mg Oral Q6H Donielle Liston Alba, PA       Or  . acetaminophen (TYLENOL) solution 975 mg  975 mg Per Tube Q6H Donielle Liston Alba, PA      . albumin human 5 % solution 250 mL  250 mL Intravenous Q15 min PRN Nani Skillern, PA   250 mL at 07/08/12 0600  . [COMPLETED] aminocaproic acid (AMICAR) 10 g in sodium chloride 0.9 % 100 mL infusion   Intravenous To OR Ivin Poot, MD   14 mL/hr at 07/07/12 1315  . aspirin EC tablet 325 mg  325 mg Oral Daily Nani Skillern, PA       Or  . aspirin chewable tablet 324 mg  324 mg Per Tube Daily Donielle Liston Alba, PA      . beta carotene w/minerals (OCUVITE) tablet 1 tablet  1 tablet Oral Daily Donielle Liston Alba, PA      . bisacodyl (DULCOLAX) EC tablet 10 mg  10 mg Oral Daily Nani Skillern, PA       Or  . bisacodyl (DULCOLAX) suppository 10 mg  10 mg Rectal Daily Donielle Liston Alba, PA      . budesonide-formoterol (SYMBICORT) 160-4.5 MCG/ACT inhaler 2 puff  2 puff Inhalation BID Ivin Poot, MD   2 puff at 07/08/12 0750  . [COMPLETED] cefUROXime (ZINACEF) 1.5 g in dextrose 5 % 50 mL IVPB  1.5 g Intravenous To  OR Ivin Poot, MD   0.75 g at 07/07/12 1320  . cefUROXime (ZINACEF) 1.5 g in dextrose 5 % 50 mL IVPB  1.5 g Intravenous Q12H Nani Skillern, PA   1.5 g at 07/08/12 0900  . docusate sodium (COLACE) capsule 200 mg  200 mg Oral Daily Nani Skillern, PA      . DOPamine (INTROPIN) 800 mg in dextrose 5 % 250 mL infusion  0-5 mcg/kg/min Intravenous Continuous Nani Skillern, PA 2.2 mL/hr at 07/08/12 0900 2 mcg/kg/min at 07/08/12 0900  . famotidine (PEPCID) IVPB 20 mg  20 mg Intravenous Q12H Nani Skillern, PA   20 mg at 07/07/12 1500  . ferrous [id number] C-folic acid (TRINSICON / FOLTRIN) capsule 1 capsule  1 capsule Oral TID PC Ivin Poot, MD      .  furosemide (LASIX) injection 20 mg  20 mg Intravenous BID Ivin Poot, MD      . [EXPIRED] insulin aspart (novoLOG) injection 0-24 Units  0-24 Units Subcutaneous Q2H Ivin Poot, MD   2 Units at 07/08/12 0020   Followed by  . insulin aspart (novoLOG) injection 0-24 Units  0-24 Units Subcutaneous Q4H Ivin Poot, MD      . insulin glargine (LANTUS) injection 12 Units  12 Units Subcutaneous Daily Tharon Aquas Trigt, MD      . [EXPIRED] lactated ringers infusion 500 mL  500 mL Intravenous Once PRN Nani Skillern, PA      . levalbuterol Penne Lash) nebulizer solution 1.25 mg  1.25 mg Nebulization TID Ivin Poot, MD   1.25 mg at 07/08/12 0750  . [COMPLETED] magnesium sulfate IVPB 4 g 100 mL  4 g Intravenous Once Nani Skillern, PA   4 g at 07/07/12 1600  . metoCLOPramide (REGLAN) injection 10 mg  10 mg Intravenous Q6H Ivin Poot, MD   10 mg at 07/08/12 0703  . metoprolol (LOPRESSOR) injection 2.5-5 mg  2.5-5 mg Intravenous Q2H PRN Nani Skillern, PA      . midazolam (VERSED) injection 2 mg  2 mg Intravenous Q1H PRN Nani Skillern, PA      . [EXPIRED] morphine 2 MG/ML injection 1-4 mg  1-4 mg Intravenous Q1H PRN Nani Skillern, PA   2 mg at 07/07/12 2000  . morphine 2 MG/ML injection 2-5 mg  2-5 mg Intravenous Q1H PRN Nani Skillern, PA   2 mg at 07/08/12 0404  . [COMPLETED] nitroGLYCERIN 0.2 mg/mL in dextrose 5 % infusion  2-200 mcg/min Intravenous To OR Ivin Poot, MD   16.67 mcg/min at 07/07/12 1155  . ondansetron (ZOFRAN) injection 4 mg  4 mg Intravenous Q6H PRN Nani Skillern, PA   4 mg at 07/07/12 2100  . pantoprazole (PROTONIX) EC tablet 40 mg  40 mg Oral Daily Donielle Liston Alba, PA      . phenylephrine (NEO-SYNEPHRINE) 20,000 mcg in dextrose 5 % 250 mL infusion  0-100 mcg/min Intravenous Continuous Nani Skillern, PA   5 mcg/min at 07/08/12 0645  . polyvinyl alcohol (LIQUIFILM TEARS) 1.4 % ophthalmic solution 1 drop  1 drop  Both Eyes Daily PRN Ivin Poot, MD      . [COMPLETED] potassium chloride 10 mEq in 50 mL *CENTRAL LINE* IVPB  10 mEq Intravenous Q1 Hr x 3 Donielle Liston Alba, PA   10 mEq at 07/07/12 1800  . potassium chloride 10 mEq in 50 mL *CENTRAL LINE* IVPB  10 mEq  Intravenous Q1H PRN Ivin Poot, MD   10 mEq at 07/07/12 2110  . promethazine (PHENERGAN) injection 12.5 mg  12.5 mg Intravenous Q6H PRN Ivin Poot, MD      . sodium chloride 0.9 % injection 3 mL  3 mL Intravenous Q12H Donielle Liston Alba, PA      . sodium chloride 0.9 % injection 3 mL  3 mL Intravenous PRN Nani Skillern, PA      . SUMAtriptan (IMITREX) tablet 50 mg  50 mg Oral BID PRN Ivin Poot, MD      . traMADol Veatrice Bourbon) tablet 50 mg  50 mg Oral Q6H PRN Ivin Poot, MD      . vancomycin (VANCOCIN) IVPB 1000 mg/200 mL premix  1,000 mg Intravenous Q12H Ivin Poot, MD   1,000 mg at 07/07/12 2303  . [DISCONTINUED] cefUROXime (ZINACEF) 750 mg in dextrose 5 % 50 mL IVPB  750 mg Intravenous To OR Ivin Poot, MD      . [DISCONTINUED] chlorhexidine (HIBICLENS) 4 % liquid 2 application  30 mL Topical UD Ivin Poot, MD      . [DISCONTINUED] desmopressin (DDAVP) 20 mcg in sodium chloride 0.9 % 50 mL IVPB  20 mcg Intravenous To OR Ivin Poot, MD      . [DISCONTINUED] dexmedetomidine (PRECEDEX) 200 MCG/50ML infusion  0.1-0.7 mcg/kg/hr Intravenous Continuous Nani Skillern, PA 4.4 mL/hr at 07/07/12 1800 0.3 mcg/kg/hr at 07/07/12 1800  . [DISCONTINUED] DOPamine (INTROPIN) 800 mg in dextrose 5 % 250 mL infusion  2-20 mcg/kg/min Intravenous To OR Ivin Poot, MD      . [DISCONTINUED] EPINEPHrine (ADRENALIN) 4,000 mcg in dextrose 5 % 250 mL infusion  0.5-20 mcg/min Intravenous To OR Ivin Poot, MD      . [DISCONTINUED] hemostatic agents    PRN Ivin Poot, MD   1 application at [id number] [phone]  . [DISCONTINUED] hemostatic agents    PRN Ivin Poot, MD   1 application at [id number]  .  [DISCONTINUED] insulin aspart (novoLOG) injection 0-24 Units  0-24 Units Subcutaneous Q4H Ivin Poot, MD      . [DISCONTINUED] insulin regular (NOVOLIN R,HUMULIN R) 1 Units/mL in sodium chloride 0.9 % 100 mL infusion   Intravenous Continuous Nani Skillern, PA 1 mL/hr at 07/07/12 1700 1 Units/hr at 07/07/12 1700  . [DISCONTINUED] insulin regular bolus via infusion 0-10 Units  0-10 Units Intravenous TID WC Donielle Liston Alba, PA      . [DISCONTINUED] lactated ringers infusion   Intravenous Continuous Nani Skillern, PA 20 mL/hr at 07/07/12 1500 20 mL/hr at 07/07/12 1500  . [DISCONTINUED] magnesium sulfate (IV Push/IM) injection 40 mEq  40 mEq Other To OR Ivin Poot, MD      . [DISCONTINUED] metoprolol tartrate (LOPRESSOR) 25 mg/10 mL oral suspension 12.5 mg  12.5 mg Per Tube BID Nani Skillern, PA      . [DISCONTINUED] metoprolol tartrate (LOPRESSOR) tablet 12.5 mg  12.5 mg Oral Once Ivin Poot, MD      . [DISCONTINUED] metoprolol tartrate (LOPRESSOR) tablet 12.5 mg  12.5 mg Oral BID Nani Skillern, PA      . [DISCONTINUED] nitroGLYCERIN 0.2 mg/mL in dextrose 5 % infusion  0-100 mcg/min Intravenous Continuous Nani Skillern, PA   20 mcg/min at 07/07/12 1930  . [DISCONTINUED] norepinephrine (LEVOPHED) 4 mg in dextrose 5 % 250 mL infusion  2-50 mcg/min Intravenous Titrated Tharon Aquas Albert Lea,  MD      . [DISCONTINUED] Polyethyl Glycol-Propyl Glycol 0.4-0.3 % SOLN 1 drop  1 drop Both Eyes Daily Nani Skillern, PA      . [DISCONTINUED] potassium chloride injection 80 mEq  80 mEq Other To OR Ivin Poot, MD      . [DISCONTINUED] sodium chloride irrigation 0.9 %    PRN Ivin Poot, MD   6,000 mL at 07/07/12 0830  . [DISCONTINUED] Surgifoam 1 Gm with 0.9% sodium chloride (4 ml) topical solution    PRN Ivin Poot, MD      . [DISCONTINUED] traMADol Veatrice Bourbon) tablet 50-100 mg  50-100 mg Oral Q4H PRN Nani Skillern, PA      . [DISCONTINUED]  vancomycin (VANCOCIN) IVPB 1000 mg/200 mL premix  1,000 mg Intravenous Once Nani Skillern, PA       Facility-Administered Medications Ordered in Other Encounters  Medication Dose Route Frequency Provider Last Rate Last Dose  . [DISCONTINUED] artificial tears (LACRILUBE) ophthalmic ointment    PRN Terrill Mohr, CRNA   1 application at 123456 [phone]  . [DISCONTINUED] DOPamine (INTROPIN) 800 mg in dextrose 5 % 250 mL infusion    Continuous PRN Terrill Mohr, CRNA 5.6 mL/hr at 07/07/12 1315 5 mcg/kg/min at 07/07/12 1315  . [DISCONTINUED] fentaNYL (SUBLIMAZE) injection    PRN Terrill Mohr, CRNA   100 mcg at 07/07/12 1340  . [DISCONTINUED] heparin injection    PRN Terrill Mohr, CRNA   5,000 Units at 07/07/12 1310  . [DISCONTINUED] lactated ringers infusion    Continuous PRN Terrill Mohr, CRNA      . [DISCONTINUED] lactated ringers infusion    Continuous PRN Terrill Mohr, CRNA      . [DISCONTINUED] lactated ringers infusion    Continuous PRN Terrill Mohr, CRNA      . [DISCONTINUED] midazolam (VERSED) 5 MG/5ML injection    PRN Terrill Mohr, CRNA   2 mg at 07/07/12 1157  . [DISCONTINUED] propofol (DIPRIVAN) 10 mg/mL bolus/IV push    PRN Terrill Mohr, CRNA   40 mg at 07/07/12 [id number]  . [DISCONTINUED] protamine injection    PRN Terrill Mohr, CRNA   30 mg at 07/07/12 1310  . [DISCONTINUED] rocuronium (ZEMURON) injection    PRN Terrill Mohr, CRNA   30 mg at 07/07/12 1345  . [DISCONTINUED] sodium bicarbonate 4.2 % injection    PRN Terrill Mohr, CRNA   25 mL at 07/07/12 1337    PE: General appearance: alert, cooperative and no distress Lungs: clear to auscultation bilaterally Heart: regular rate and rhythm and + rub Extremities: No LEE Pulses: 2+ and symmetric Skin: warm and dry. Neurologic: Grossly normal  Lab Results:   Basename 07/08/12 0410 07/07/12 2203 07/07/12 2158 07/07/12 1535  WBC 13.1* -- 12.0* 9.5  HGB 10.3* 9.5* 10.3* --  HCT 28.9* 28.0* 29.4* --  PLT 90* -- 85* 90*    BMET  Basename 07/08/12 0410 07/07/12 2203 07/07/12 2158 07/07/12 1524 07/06/12 1331  NA 138 139 -- 143 --  K 4.4 4.9 -- 3.2* --  CL 104 105 -- -- 105  CO2 25 -- -- -- 21  GLUCOSE 126* 154* -- 128* --  BUN 12 11 -- -- 14  CREATININE 1.00 1.10 0.95 -- --  CALCIUM 6.8* -- -- -- 9.8   PT/INR  Basename 07/07/12 1535 07/06/12 1331  LABPROT 15.4* 12.6  INR 1.24 0.95   Cholesterol No results  found for this basename: CHOL in the last 72 hours Cardiac Enzymes No components found with this basename: TROPONIN:3, CKMB:3  Studies/Results: PORTABLE CHEST - 1 VIEW  Comparison: 07/07/2012  Findings: Endotracheal tube and NG tube have been removed. Gordy Councilman catheter tip is in the main pulmonary artery. Bilateral chest  tubes are unchanged. No pneumothorax. Minimal atelectasis at the  bases. No effusions. Improved aeration in the right upper lobe.  IMPRESSION:  Improved aeration in the right upper lobe. Minimal atelectasis at  the bases.    Assessment/Plan    Active Problems: 1. S/P S/P Procedure(s) (LRB): REPLACEMENT ASCENDING AORTA (N/A) REDO STERNOTOMY (N/A) 2.  Hypotension. 3.  AVR, porcine: 08/2002  Plan:  POD #1.  Hypotensive in the 80's.  Apacing.  Improved aeration in RUL on CXR.       LOS: 1 day    [name] [name] 07/08/2012 10:02 AM

## 2012-07-09 ENCOUNTER — Inpatient Hospital Stay (HOSPITAL_COMMUNITY): Payer: Medicare Other

## 2012-07-09 LAB — BASIC METABOLIC PANEL
BUN: 12 mg/dL (ref 6–23)
BUN: 13 mg/dL (ref 6–23)
CO2: 26 mEq/L (ref 19–32)
CO2: 26 mEq/L (ref 19–32)
Calcium: 6.6 mg/dL — ABNORMAL LOW (ref 8.4–10.5)
Calcium: 7.5 mg/dL — ABNORMAL LOW (ref 8.4–10.5)
Chloride: 98 mEq/L (ref 96–112)
Chloride: 97 mEq/L (ref 96–112)
Creatinine, Ser: 1.25 mg/dL — ABNORMAL HIGH (ref 0.50–1.10)
Creatinine, Ser: 1.38 mg/dL — ABNORMAL HIGH (ref 0.50–1.10)
GFR calc Af Amer: 49 mL/min — ABNORMAL LOW (ref >90)
GFR calc Af Amer: 43 mL/min — ABNORMAL LOW (ref >90)
GFR calc non Af Amer: 42 mL/min — ABNORMAL LOW (ref >90)
GFR calc non Af Amer: 37 mL/min — ABNORMAL LOW (ref >90)
Glucose, Bld: 147 mg/dL — ABNORMAL HIGH (ref 70–99)
Glucose, Bld: 132 mg/dL — ABNORMAL HIGH (ref 70–99)
Potassium: 3.7 mEq/L (ref 3.5–5.1)
Potassium: 3.9 mEq/L (ref 3.5–5.1)
Sodium: 136 mEq/L (ref 135–145)
Sodium: 133 mEq/L — ABNORMAL LOW (ref 135–145)

## 2012-07-09 LAB — CBC
HCT: 25.9 % — ABNORMAL LOW (ref 36.0–46.0)
Hemoglobin: 9 g/dL — ABNORMAL LOW (ref 12.0–15.0)
MCH: 31.6 pg (ref 26.0–34.0)
MCHC: 34.7 g/dL (ref 30.0–36.0)
MCV: 90.9 fL (ref 78.0–100.0)
Platelets: 86 10*3/uL — ABNORMAL LOW (ref 150–400)
RBC: 2.85 MIL/uL — ABNORMAL LOW (ref 3.87–5.11)
RDW: 16.4 % — ABNORMAL HIGH (ref 11.5–15.5)
WBC: 12.6 10*3/uL — ABNORMAL HIGH (ref 4.0–10.5)

## 2012-07-09 LAB — GLUCOSE, CAPILLARY
Glucose-Capillary: 106 mg/dL — ABNORMAL HIGH (ref 70–99)
Glucose-Capillary: 121 mg/dL — ABNORMAL HIGH (ref 70–99)
Glucose-Capillary: 141 mg/dL — ABNORMAL HIGH (ref 70–99)
Glucose-Capillary: 145 mg/dL — ABNORMAL HIGH (ref 70–99)
Glucose-Capillary: 147 mg/dL — ABNORMAL HIGH (ref 70–99)
Glucose-Capillary: 160 mg/dL — ABNORMAL HIGH (ref 70–99)

## 2012-07-09 MED ORDER — ALBUMIN HUMAN 5 % IV SOLN
INTRAVENOUS | Status: AC
  Administered 2012-07-09: 12.5 g via INTRAVENOUS
  Filled 2012-07-09: qty 250

## 2012-07-09 MED ORDER — AMIODARONE HCL IN DEXTROSE 360-4.14 MG/200ML-% IV SOLN
20.0000 mg/h | INTRAVENOUS | Status: DC
  Administered 2012-07-08: 200 mL via INTRAVENOUS
  Administered 2012-07-09: 20 mg/h via INTRAVENOUS
  Administered 2012-07-09 (×2): 60 mg/h via INTRAVENOUS
  Filled 2012-07-09 (×5): qty 200

## 2012-07-09 MED ORDER — POTASSIUM CHLORIDE 10 MEQ/50ML IV SOLN
10.0000 meq | INTRAVENOUS | Status: AC | PRN
  Administered 2012-07-09 (×3): 10 meq via INTRAVENOUS

## 2012-07-09 MED ORDER — ALBUMIN HUMAN 5 % IV SOLN
12.5000 g | Freq: Once | INTRAVENOUS | Status: AC
  Administered 2012-07-09 – 2012-07-10 (×2): 12.5 g via INTRAVENOUS

## 2012-07-09 MED ORDER — FE FUMARATE-B12-VIT C-FA-IFC PO CAPS: 1.0000 | ORAL_CAPSULE | Freq: Three times a day (TID) | ORAL | Status: DC

## 2012-07-09 MED ORDER — AMIODARONE LOAD VIA INFUSION
150.0000 mg | Freq: Once | INTRAVENOUS | Status: AC
  Administered 2012-07-08: 150 mg via INTRAVENOUS

## 2012-07-09 NOTE — Progress Notes (Signed)
[facility]  DAILY PROGRESS NOTE   Subjective:  Awake, alert, generally well controlled painj. Still on vasopressors and dopamine infusion. Sinus rhythm with frequent junctional escape beats, while on amiodarone infusion.  AF overnight.  Objective:  Temp:  [97.6 F (36.4 C)-98.6 F (37 C)] 98.3 F (36.8 C) (12/05 0740) Pulse Rate:  [67-139] 67  (12/05 0700) Resp:  [14-32] 25  (12/05 0700) BP: (70-98)/(36-57) 77/39 mmHg (12/05 0700) SpO2:  [90 %-100 %] 96 % (12/05 0700) FiO2 (%):  [50 %] 50 % (12/05 0600) Weight:  [66.6 kg (146 lb 13.2 oz)] 66.6 kg (146 lb 13.2 oz) (12/05 0500) Weight change: 2.01 kg (4 lb 6.9 oz)  Intake/Output from previous day: 12/04 0701 - 12/05 0700 In: 2811.9 [P.O.:1080; I.V.:1071.9; IV Piggyback:660] Out: 2620 [Urine:2160; Chest Tube:460]  Intake/Output from this shift:    Medications: Current Facility-Administered Medications  Medication Dose Route Frequency Provider Last Rate Last Dose  . 0.45 % sodium chloride infusion   Intravenous Continuous Nani Skillern, PA 20 mL/hr at 07/07/12 1500 20 mL/hr at 07/07/12 1500  . 0.9 %  sodium chloride infusion   Intravenous Continuous Nani Skillern, PA 20 mL/hr at 07/09/12 0500    . 0.9 %  sodium chloride infusion  250 mL Intravenous Continuous Nani Skillern, PA 1 mL/hr at 07/08/12 0600 250 mL at 07/08/12 0600  . acetaminophen (TYLENOL) tablet 1,000 mg  1,000 mg Oral Q6H Donielle Liston Alba, PA       Or  . acetaminophen (TYLENOL) solution 975 mg  975 mg Per Tube Q6H Donielle Liston Alba, PA      . [EXPIRED] albumin human 5 % solution 250 mL  250 mL Intravenous Q15 min PRN Nani Skillern, PA   250 mL at 07/08/12 0600  . amiodarone (NEXTERONE PREMIX) 360 mg/200 mL dextrose IV infusion  60 mg/hr Intravenous Continuous Rexene Alberts, MD 16.7 mL/hr at 07/09/12 0543 30 mg/hr at 07/09/12 0543  . [COMPLETED] amiodarone (NEXTERONE) 1.8 mg/mL load via infusion 150  mg  150 mg Intravenous Once Rexene Alberts, MD 500.1 mL/hr at 07/08/12 2111 150 mg at 07/08/12 2111  . aspirin EC tablet 325 mg  325 mg Oral Daily Nani Skillern, [place]   325 mg at 07/08/12 1025   Or  . aspirin chewable tablet 324 mg  324 mg Per Tube Daily Donielle Liston Alba, PA      . beta carotene w/minerals (OCUVITE) tablet 1 tablet  1 tablet Oral Daily Nani Skillern, PA   1 tablet at 07/08/12 1025  . bisacodyl (DULCOLAX) EC tablet 10 mg  10 mg Oral Daily Nani Skillern, PA   10 mg at 07/08/12 1025   Or  . bisacodyl (DULCOLAX) suppository 10 mg  10 mg Rectal Daily Nani Skillern, PA      . budesonide-formoterol (SYMBICORT) 160-4.5 MCG/ACT inhaler 2 puff  2 puff Inhalation BID Ivin Poot, MD   2 puff at 07/08/12 1912  . cefUROXime (ZINACEF) 1.5 g in dextrose 5 % 50 mL IVPB  1.5 g Intravenous Q12H Nani Skillern, PA   1.5 g at 07/08/12 2258  . docusate sodium (COLACE) capsule 200 mg  200 mg Oral Daily Nani Skillern, PA   200 mg at 07/08/12 1025  . DOPamine (INTROPIN) 800 mg in dextrose 5 % 250 mL infusion  0-5 mcg/kg/min Intravenous Continuous Nani Skillern, PA 2.2 mL/hr at 07/08/12 2000 2 mcg/kg/min at  07/08/12 2000  . [EXPIRED] famotidine (PEPCID) IVPB 20 mg  20 mg Intravenous Q12H Nani Skillern, PA   20 mg at 07/07/12 1500  . ferrous [id number] C-folic acid (TRINSICON / FOLTRIN) capsule 1 capsule  1 capsule Oral TID PC Ivin Poot, MD   1 capsule at 07/08/12 1732  . furosemide (LASIX) injection 20 mg  20 mg Intravenous BID Ivin Poot, MD   20 mg at 07/09/12 0756  . insulin aspart (novoLOG) injection 0-24 Units  0-24 Units Subcutaneous Q4H Ivin Poot, MD   2 Units at 07/09/12 [phone]  . insulin glargine (LANTUS) injection 12 Units  12 Units Subcutaneous Daily Ivin Poot, MD   12 Units at 07/08/12 1000  . levalbuterol (XOPENEX) nebulizer solution 1.25 mg  1.25 mg Nebulization TID Ivin Poot, MD   1.25 mg at  07/08/12 1912  . [COMPLETED] metoCLOPramide (REGLAN) injection 10 mg  10 mg Intravenous Q6H Ivin Poot, MD   10 mg at 07/08/12 1200  . metoprolol (LOPRESSOR) injection 2.5-5 mg  2.5-5 mg Intravenous Q2H PRN Nani Skillern, PA      . midazolam (VERSED) injection 2 mg  2 mg Intravenous Q1H PRN Nani Skillern, PA      . morphine 2 MG/ML injection 2-5 mg  2-5 mg Intravenous Q1H PRN Nani Skillern, PA   2 mg at 07/08/12 1245  . ondansetron (ZOFRAN) injection 4 mg  4 mg Intravenous Q6H PRN Nani Skillern, PA   4 mg at 07/09/12 0657  . pantoprazole (PROTONIX) EC tablet 40 mg  40 mg Oral Daily Donielle Liston Alba, PA      . phenylephrine (NEO-SYNEPHRINE) 20,000 mcg in dextrose 5 % 250 mL infusion  0-100 mcg/min Intravenous Continuous Nani Skillern, PA 18.8 mL/hr at 07/09/12 0800 25 mcg/min at 07/09/12 0800  . polyvinyl alcohol (LIQUIFILM TEARS) 1.4 % ophthalmic solution 1 drop  1 drop Both Eyes Daily PRN Ivin Poot, MD      . potassium chloride 10 mEq in 50 mL *CENTRAL LINE* IVPB  10 mEq Intravenous Q1H PRN Ivin Poot, MD   10 mEq at 07/07/12 2110  . [COMPLETED] potassium chloride 10 mEq in 50 mL *CENTRAL LINE* IVPB  10 mEq Intravenous Q1H PRN Ivin Poot, MD   10 mEq at 07/09/12 0653  . promethazine (PHENERGAN) injection 12.5 mg  12.5 mg Intravenous Q6H PRN Ivin Poot, MD      . sodium chloride 0.9 % injection 3 mL  3 mL Intravenous Q12H Nani Skillern, PA   3 mL at 07/08/12 2257  . sodium chloride 0.9 % injection 3 mL  3 mL Intravenous PRN Nani Skillern, PA      . SUMAtriptan (IMITREX) tablet 50 mg  50 mg Oral BID PRN Ivin Poot, MD      . traMADol Veatrice Bourbon) tablet 50 mg  50 mg Oral Q6H PRN Ivin Poot, MD   50 mg at 07/09/12 0603  . vancomycin (VANCOCIN) IVPB 1000 mg/200 mL premix  1,000 mg Intravenous Q12H Ivin Poot, MD   1,000 mg at 07/09/12 0000  . [DISCONTINUED] metoprolol tartrate (LOPRESSOR) 25 mg/10 mL oral  suspension 12.5 mg  12.5 mg Per Tube BID Nani Skillern, PA      . [DISCONTINUED] metoprolol tartrate (LOPRESSOR) tablet 12.5 mg  12.5 mg Oral BID Nani Skillern, PA        Physical Exam: General appearance:  alert, cooperative and pale Neck: no adenopathy, no carotid bruit, no JVD, supple, symmetrical, trachea midline and thyroid not enlarged, symmetric, no tenderness/mass/nodules Lungs: clear to auscultation bilaterally Heart: regular rate and rhythm, S1, S2 normal, no murmur, click, rub or gallop Abdomen: soft, non-tender; bowel sounds normal; no masses,  no organomegaly Extremities: extremities normal, atraumatic, no cyanosis or edema Pulses: 2+ and symmetric Neurologic: Grossly normal  Lab Results: Results for orders placed during the hospital encounter of 07/07/12 (from the past 48 hour(s))  POCT I-STAT 4, (NA,K, GLUC, HGB,HCT)     Status: Abnormal   Collection Time   07/07/12  9:55 AM      Component Value Range Comment   Sodium 140  135 - 145 mEq/L    Potassium 4.4  3.5 - 5.1 mEq/L    Glucose, Bld 113 (*) 70 - 99 mg/dL    HCT 21.0 (*) 36.0 - 46.0 %    Hemoglobin 7.1 (*) 12.0 - 15.0 g/dL   POCT I-STAT 4, (NA,K, GLUC, HGB,HCT)     Status: Abnormal   Collection Time   07/07/12 10:24 AM      Component Value Range Comment   Sodium 139  135 - 145 mEq/L    Potassium 5.3 (*) 3.5 - 5.1 mEq/L    Glucose, Bld 126 (*) 70 - 99 mg/dL    HCT 20.0 (*) 36.0 - 46.0 %    Hemoglobin 6.8 (*) 12.0 - 15.0 g/dL   POCT I-STAT 3, BLOOD GAS (G3+)     Status: Abnormal   Collection Time   07/07/12 10:28 AM      Component Value Range Comment   pH, Arterial 7.385  7.350 - 7.450    pCO2 arterial 39.5  35.0 - 45.0 mmHg    pO2, Arterial 380.0 (*) 80.0 - 100.0 mmHg    Bicarbonate 23.6  20.0 - 24.0 mEq/L    TCO2 25  0 - 100 mmol/L    O2 Saturation 100.0      Acid-base deficit 1.0  0.0 - 2.0 mmol/L    Sample type ARTERIAL     POCT I-STAT 4, (NA,K, GLUC, HGB,HCT)     Status: Abnormal    Collection Time   07/07/12 11:23 AM      Component Value Range Comment   Sodium 136  135 - 145 mEq/L    Potassium 6.2 (*) 3.5 - 5.1 mEq/L    Glucose, Bld 121 (*) 70 - 99 mg/dL    HCT 33.0 (*) 36.0 - 46.0 %    Hemoglobin 11.2 (*) 12.0 - 15.0 g/dL   PREPARE PLATELET PHERESIS     Status: Normal   Collection Time   07/07/12 11:30 AM      Component Value Range Comment   Unit Number [id number]      Blood Component Type PLTPHER LR1      Unit division 00      Status of Unit ISSUED,FINAL      Transfusion Status OK TO TRANSFUSE     PREPARE FRESH FROZEN PLASMA     Status: Normal   Collection Time   07/07/12 11:30 AM      Component Value Range Comment   Unit Number [id number]      Blood Component Type THAWED PLASMA      Unit division 00      Status of Unit ISSUED,FINAL      Transfusion Status OK TO TRANSFUSE      Unit Number [id number]  Blood Component Type THAWED PLASMA      Unit division 00      Status of Unit ISSUED,FINAL      Transfusion Status OK TO TRANSFUSE      Unit Number [id number]      Blood Component Type THAWED PLASMA      Unit division 00      Status of Unit ISSUED,FINAL      Transfusion Status OK TO TRANSFUSE     POCT I-STAT 3, BLOOD GAS (G3+)     Status: Abnormal   Collection Time   07/07/12 11:34 AM      Component Value Range Comment   pH, Arterial 7.422  7.350 - 7.450    pCO2 arterial 49.9 (*) 35.0 - 45.0 mmHg    pO2, Arterial 542.0 (*) 80.0 - 100.0 mmHg    Bicarbonate 32.5 (*) 20.0 - 24.0 mEq/L    TCO2 34  0 - 100 mmol/L    O2 Saturation 100.0      Acid-Base Excess 7.0 (*) 0.0 - 2.0 mmol/L    Sample type ARTERIAL     HEMOGLOBIN AND HEMATOCRIT, BLOOD     Status: Abnormal   Collection Time   07/07/12 12:03 PM      Component Value Range Comment   Hemoglobin 10.2 (*) 12.0 - 15.0 g/dL    HCT 28.6 (*) 36.0 - 46.0 %   PLATELET COUNT     Status: Abnormal   Collection Time   07/07/12 12:03 PM      Component Value Range Comment   Platelets 42 (*) 150 -  400 K/uL   POCT I-STAT 3, BLOOD GAS (G3+)     Status: Abnormal   Collection Time   07/07/12  1:12 PM      Component Value Range Comment   pH, Arterial 7.587 (*) 7.350 - 7.450    pCO2 arterial 20.0 (*) 35.0 - 45.0 mmHg    pO2, Arterial 38.0 (*) 80.0 - 100.0 mmHg    Bicarbonate 19.0 (*) 20.0 - 24.0 mEq/L    TCO2 20  0 - 100 mmol/L    O2 Saturation 83.0      Acid-base deficit 2.0  0.0 - 2.0 mmol/L    Collection site RADIAL, ALLEN'S TEST ACCEPTABLE      Sample type ARTERIAL     POCT I-STAT 3, BLOOD GAS (G3+)     Status: Abnormal   Collection Time   07/07/12  1:33 PM      Component Value Range Comment   pH, Arterial 7.487 (*) 7.350 - 7.450    pCO2 arterial 26.3 (*) 35.0 - 45.0 mmHg    pO2, Arterial 122.0 (*) 80.0 - 100.0 mmHg    Bicarbonate 19.9 (*) 20.0 - 24.0 mEq/L    TCO2 21  0 - 100 mmol/L    O2 Saturation 99.0      Acid-base deficit 3.0 (*) 0.0 - 2.0 mmol/L    Sample type ARTERIAL     POCT I-STAT 4, (NA,K, GLUC, HGB,HCT)     Status: Abnormal   Collection Time   07/07/12  1:36 PM      Component Value Range Comment   Sodium 143  135 - 145 mEq/L    Potassium 3.6  3.5 - 5.1 mEq/L    Glucose, Bld 164 (*) 70 - 99 mg/dL    HCT 28.0 (*) 36.0 - 46.0 %    Hemoglobin 9.5 (*) 12.0 - 15.0 g/dL   GLUCOSE, CAPILLARY  Status: Abnormal   Collection Time   07/07/12  3:22 PM      Component Value Range Comment   Glucose-Capillary 122 (*) 70 - 99 mg/dL   POCT I-STAT 4, (NA,K, GLUC, HGB,HCT)     Status: Abnormal   Collection Time   07/07/12  3:24 PM      Component Value Range Comment   Sodium 143  135 - 145 mEq/L    Potassium 3.2 (*) 3.5 - 5.1 mEq/L    Glucose, Bld 128 (*) 70 - 99 mg/dL    HCT 34.0 (*) 36.0 - 46.0 %    Hemoglobin 11.6 (*) 12.0 - 15.0 g/dL   POCT I-STAT 3, BLOOD GAS (G3+)     Status: Abnormal   Collection Time   07/07/12  3:28 PM      Component Value Range Comment   pH, Arterial 7.425  7.350 - 7.450    pCO2 arterial 39.6  35.0 - 45.0 mmHg    pO2, Arterial 249.0 (*) 80.0  - 100.0 mmHg    Bicarbonate 26.7 (*) 20.0 - 24.0 mEq/L    TCO2 28  0 - 100 mmol/L    O2 Saturation 100.0      Acid-Base Excess 1.0  0.0 - 2.0 mmol/L    Patient temperature 34.2 C      Collection site ARTERIAL LINE      Drawn by Operator      Sample type ARTERIAL     CBC     Status: Abnormal   Collection Time   07/07/12  3:35 PM      Component Value Range Comment   WBC 9.5  4.0 - 10.5 K/uL    RBC 3.76 (*) 3.87 - 5.11 MIL/uL    Hemoglobin 11.8 (*) 12.0 - 15.0 g/dL POST TRANSFUSION SPECIMEN   HCT 33.0 (*) 36.0 - 46.0 %    MCV 87.8  78.0 - 100.0 fL    MCH 31.4  26.0 - 34.0 pg    MCHC 35.8  30.0 - 36.0 g/dL    RDW 15.3  11.5 - 15.5 %    Platelets 90 (*) 150 - 400 K/uL POST TRANSFUSION SPECIMEN  PROTIME-INR     Status: Abnormal   Collection Time   07/07/12  3:35 PM      Component Value Range Comment   Prothrombin Time 15.4 (*) 11.6 - 15.2 seconds    INR 1.24  0.00 - 1.49   APTT     Status: Abnormal   Collection Time   07/07/12  3:35 PM      Component Value Range Comment   aPTT 38 (*) 24 - 37 seconds   GLUCOSE, CAPILLARY     Status: Abnormal   Collection Time   07/07/12  4:35 PM      Component Value Range Comment   Glucose-Capillary 105 (*) 70 - 99 mg/dL   GLUCOSE, CAPILLARY     Status: Normal   Collection Time   07/07/12  5:16 PM      Component Value Range Comment   Glucose-Capillary 94  70 - 99 mg/dL   GLUCOSE, CAPILLARY     Status: Abnormal   Collection Time   07/07/12  6:28 PM      Component Value Range Comment   Glucose-Capillary 101 (*) 70 - 99 mg/dL   GLUCOSE, CAPILLARY     Status: Normal   Collection Time   07/07/12  7:46 PM      Component Value Range Comment  Glucose-Capillary 90  70 - 99 mg/dL   GLUCOSE, CAPILLARY     Status: Abnormal   Collection Time   07/07/12  8:35 PM      Component Value Range Comment   Glucose-Capillary 101 (*) 70 - 99 mg/dL   POCT I-STAT 3, BLOOD GAS (G3+)     Status: Abnormal   Collection Time   07/07/12  8:37 PM      Component Value  Range Comment   pH, Arterial 7.376  7.350 - 7.450    pCO2 arterial 39.0  35.0 - 45.0 mmHg    pO2, Arterial 101.0 (*) 80.0 - 100.0 mmHg    Bicarbonate 23.0  20.0 - 24.0 mEq/L    TCO2 24  0 - 100 mmol/L    O2 Saturation 98.0      Acid-base deficit 2.0  0.0 - 2.0 mmol/L    Patient temperature 36.1 C      Collection site RADIAL, ALLEN'S TEST ACCEPTABLE      Drawn by Nurse      Sample type ARTERIAL     CBC     Status: Abnormal   Collection Time   07/07/12  9:58 PM      Component Value Range Comment   WBC 12.0 (*) 4.0 - 10.5 K/uL    RBC 3.34 (*) 3.87 - 5.11 MIL/uL    Hemoglobin 10.3 (*) 12.0 - 15.0 g/dL    HCT 29.4 (*) 36.0 - 46.0 %    MCV 88.0  78.0 - 100.0 fL    MCH 30.8  26.0 - 34.0 pg    MCHC 35.0  30.0 - 36.0 g/dL    RDW 15.9 (*) 11.5 - 15.5 %    Platelets 85 (*) 150 - 400 K/uL CONSISTENT WITH PREVIOUS RESULT  MAGNESIUM     Status: Abnormal   Collection Time   07/07/12  9:58 PM      Component Value Range Comment   Magnesium 3.3 (*) 1.5 - 2.5 mg/dL   CREATININE, SERUM     Status: Abnormal   Collection Time   07/07/12  9:58 PM      Component Value Range Comment   Creatinine, Ser 0.95  0.50 - 1.10 mg/dL    GFR calc non Af Amer 58 (*) >90 mL/min    GFR calc Af Amer 68 (*) >90 mL/min   POCT I-STAT 3, BLOOD GAS (G3+)     Status: Abnormal   Collection Time   07/07/12 10:02 PM      Component Value Range Comment   pH, Arterial 7.271 (*) 7.350 - 7.450    pCO2 arterial 51.7 (*) 35.0 - 45.0 mmHg    pO2, Arterial 73.0 (*) 80.0 - 100.0 mmHg    Bicarbonate 24.1 (*) 20.0 - 24.0 mEq/L    TCO2 26  0 - 100 mmol/L    O2 Saturation 93.0      Acid-base deficit 3.0 (*) 0.0 - 2.0 mmol/L    Patient temperature 35.8 C      Collection site RADIAL, ALLEN'S TEST ACCEPTABLE      Drawn by Nurse      Sample type ARTERIAL     POCT I-STAT, CHEM 8     Status: Abnormal   Collection Time   07/07/12 10:03 PM      Component Value Range Comment   Sodium 139  135 - 145 mEq/L    Potassium 4.9  3.5 - 5.1  mEq/L    Chloride 105  96 -  112 mEq/L    BUN 11  6 - 23 mg/dL    Creatinine, Ser 1.10  0.50 - 1.10 mg/dL    Glucose, Bld 154 (*) 70 - 99 mg/dL    Calcium, Ion 0.91 (*) 1.13 - 1.30 mmol/L    TCO2 23  0 - 100 mmol/L    Hemoglobin 9.5 (*) 12.0 - 15.0 g/dL    HCT 28.0 (*) 36.0 - 46.0 %   POCT I-STAT 3, BLOOD GAS (G3+)     Status: Abnormal   Collection Time   07/07/12 11:03 PM      Component Value Range Comment   pH, Arterial 7.307 (*) 7.350 - 7.450    pCO2 arterial 49.1 (*) 35.0 - 45.0 mmHg    pO2, Arterial 178.0 (*) 80.0 - 100.0 mmHg    Bicarbonate 24.9 (*) 20.0 - 24.0 mEq/L    TCO2 26  0 - 100 mmol/L    O2 Saturation 99.0      Acid-base deficit 2.0  0.0 - 2.0 mmol/L    Patient temperature 35.9 C      Collection site RADIAL, ALLEN'S TEST ACCEPTABLE      Drawn by Nurse      Sample type ARTERIAL     GLUCOSE, CAPILLARY     Status: Abnormal   Collection Time   07/08/12 12:02 AM      Component Value Range Comment   Glucose-Capillary 153 (*) 70 - 99 mg/dL    Comment 1 Documented in Chart      Comment 2 Notify RN     POCT I-STAT 3, BLOOD GAS (G3+)     Status: Abnormal   Collection Time   07/08/12  3:53 AM      Component Value Range Comment   pH, Arterial 7.342 (*) 7.350 - 7.450    pCO2 arterial 46.7 (*) 35.0 - 45.0 mmHg    pO2, Arterial 72.0 (*) 80.0 - 100.0 mmHg    Bicarbonate 25.4 (*) 20.0 - 24.0 mEq/L    TCO2 27  0 - 100 mmol/L    O2 Saturation 93.0      Acid-base deficit 1.0  0.0 - 2.0 mmol/L    Patient temperature 36.8 C      Sample type ARTERIAL     CBC     Status: Abnormal   Collection Time   07/08/12  4:10 AM      Component Value Range Comment   WBC 13.1 (*) 4.0 - 10.5 K/uL    RBC 3.29 (*) 3.87 - 5.11 MIL/uL    Hemoglobin 10.3 (*) 12.0 - 15.0 g/dL    HCT 28.9 (*) 36.0 - 46.0 %    MCV 87.8  78.0 - 100.0 fL    MCH 31.3  26.0 - 34.0 pg    MCHC 35.6  30.0 - 36.0 g/dL    RDW 16.1 (*) 11.5 - 15.5 %    Platelets 90 (*) 150 - 400 K/uL CONSISTENT WITH PREVIOUS RESULT  BASIC  METABOLIC PANEL     Status: Abnormal   Collection Time   07/08/12  4:10 AM      Component Value Range Comment   Sodium 138  135 - 145 mEq/L    Potassium 4.4  3.5 - 5.1 mEq/L    Chloride 104  96 - 112 mEq/L    CO2 25  19 - 32 mEq/L    Glucose, Bld 126 (*) 70 - 99 mg/dL    BUN 12  6 - 23 mg/dL  Creatinine, Ser 1.00  0.50 - 1.10 mg/dL    Calcium 6.8 (*) 8.4 - 10.5 mg/dL    GFR calc non Af Amer 55 (*) >90 mL/min    GFR calc Af Amer 64 (*) >90 mL/min   MAGNESIUM     Status: Abnormal   Collection Time   07/08/12  4:10 AM      Component Value Range Comment   Magnesium 2.9 (*) 1.5 - 2.5 mg/dL   GLUCOSE, CAPILLARY     Status: Abnormal   Collection Time   07/08/12  4:39 AM      Component Value Range Comment   Glucose-Capillary 118 (*) 70 - 99 mg/dL    Comment 1 Documented in Chart      Comment 2 Notify RN     GLUCOSE, CAPILLARY     Status: Abnormal   Collection Time   07/08/12  7:51 AM      Component Value Range Comment   Glucose-Capillary 107 (*) 70 - 99 mg/dL    Comment 1 Notify RN     GLUCOSE, CAPILLARY     Status: Abnormal   Collection Time   07/08/12 11:51 AM      Component Value Range Comment   Glucose-Capillary 159 (*) 70 - 99 mg/dL   GLUCOSE, CAPILLARY     Status: Abnormal   Collection Time   07/08/12  4:14 PM      Component Value Range Comment   Glucose-Capillary 131 (*) 70 - 99 mg/dL   MAGNESIUM     Status: Abnormal   Collection Time   07/08/12  4:15 PM      Component Value Range Comment   Magnesium 2.6 (*) 1.5 - 2.5 mg/dL   CBC     Status: Abnormal   Collection Time   07/08/12  4:15 PM      Component Value Range Comment   WBC 14.7 (*) 4.0 - 10.5 K/uL    RBC 2.99 (*) 3.87 - 5.11 MIL/uL    Hemoglobin 9.3 (*) 12.0 - 15.0 g/dL    HCT 26.7 (*) 36.0 - 46.0 %    MCV 89.3  78.0 - 100.0 fL    MCH 31.1  26.0 - 34.0 pg    MCHC 34.8  30.0 - 36.0 g/dL    RDW 16.4 (*) 11.5 - 15.5 %    Platelets 85 (*) 150 - 400 K/uL CONSISTENT WITH PREVIOUS RESULT  CREATININE, SERUM      Status: Abnormal   Collection Time   07/08/12  4:15 PM      Component Value Range Comment   Creatinine, Ser 1.21 (*) 0.50 - 1.10 mg/dL    GFR calc non Af Amer 44 (*) >90 mL/min    GFR calc Af Amer 51 (*) >90 mL/min   POCT I-STAT, CHEM 8     Status: Abnormal   Collection Time   07/08/12  4:17 PM      Component Value Range Comment   Sodium 138  135 - 145 mEq/L    Potassium 4.4  3.5 - 5.1 mEq/L    Chloride 100  96 - 112 mEq/L    BUN 12  6 - 23 mg/dL    Creatinine, Ser 1.20 (*) 0.50 - 1.10 mg/dL    Glucose, Bld 132 (*) 70 - 99 mg/dL    Calcium, Ion 0.95 (*) 1.13 - 1.30 mmol/L    TCO2 25  0 - 100 mmol/L    Hemoglobin 8.8 (*) 12.0 - 15.0 g/dL  HCT 26.0 (*) 36.0 - 46.0 %   GLUCOSE, CAPILLARY     Status: Abnormal   Collection Time   07/08/12  7:41 PM      Component Value Range Comment   Glucose-Capillary 114 (*) 70 - 99 mg/dL    Comment 1 Notify RN      Comment 2 Documented in Chart     GLUCOSE, CAPILLARY     Status: Abnormal   Collection Time   07/09/12 12:13 AM      Component Value Range Comment   Glucose-Capillary 145 (*) 70 - 99 mg/dL    Comment 1 Notify RN      Comment 2 Documented in Chart     GLUCOSE, CAPILLARY     Status: Abnormal   Collection Time   07/09/12  3:40 AM      Component Value Range Comment   Glucose-Capillary 147 (*) 70 - 99 mg/dL    Comment 1 Notify RN      Comment 2 Documented in Chart     BASIC METABOLIC PANEL     Status: Abnormal   Collection Time   07/09/12  3:59 AM      Component Value Range Comment   Sodium 136  135 - 145 mEq/L    Potassium 3.7  3.5 - 5.1 mEq/L    Chloride 98  96 - 112 mEq/L    CO2 26  19 - 32 mEq/L    Glucose, Bld 147 (*) 70 - 99 mg/dL    BUN 12  6 - 23 mg/dL    Creatinine, Ser 1.25 (*) 0.50 - 1.10 mg/dL    Calcium 6.6 (*) 8.4 - 10.5 mg/dL    GFR calc non Af Amer 42 (*) >90 mL/min    GFR calc Af Amer 49 (*) >90 mL/min   CBC     Status: Abnormal   Collection Time   07/09/12  3:59 AM      Component Value Range Comment   WBC  12.6 (*) 4.0 - 10.5 K/uL    RBC 2.85 (*) 3.87 - 5.11 MIL/uL    Hemoglobin 9.0 (*) 12.0 - 15.0 g/dL    HCT 25.9 (*) 36.0 - 46.0 %    MCV 90.9  78.0 - 100.0 fL    MCH 31.6  26.0 - 34.0 pg    MCHC 34.7  30.0 - 36.0 g/dL    RDW 16.4 (*) 11.5 - 15.5 %    Platelets 86 (*) 150 - 400 K/uL CONSISTENT WITH PREVIOUS RESULT  GLUCOSE, CAPILLARY     Status: Abnormal   Collection Time   07/09/12  7:38 AM      Component Value Range Comment   Glucose-Capillary 121 (*) 70 - 99 mg/dL    Comment 1 Notify RN       Imaging: Dg Chest Portable 1 View In Am  07/09/2012  *RADIOLOGY REPORT*  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  PORTABLE CHEST - 1 VIEW  Comparison: 07/08/2012  Findings: Swan-Ganz catheter and chest tubes have been removed. Sheath of the Swan-Ganz catheter remains in the superior vena cava. No pneumothorax after chest tube removal.  Slight atelectasis in the midzones bilaterally.  Increased atelectasis at both bases, left greater than right.  Small left effusion.  IMPRESSION: Progressive atelectasis primarily at the left base with a small left effusion.   Original Report Authenticated By: Lorriane Shire, M.D.    Dg Chest Portable 1 View In Am  07/08/2012  *RADIOLOGY REPORT*  Clinical Data: Aortic aneurysm.  PORTABLE CHEST - 1 VIEW  Comparison: 07/07/2012  Findings: Endotracheal tube and NG tube have been removed.  Gordy Councilman catheter tip is in the main pulmonary artery.  Bilateral chest tubes are unchanged.  No pneumothorax.  Minimal atelectasis at the bases.  No effusions.  Improved aeration in the right upper lobe.  IMPRESSION: Improved aeration in the right upper lobe.  Minimal atelectasis at the bases.   Original Report Authenticated By: Lorriane Shire, M.D.    Dg Chest Portable 1 View  07/07/2012  *RADIOLOGY REPORT*  Clinical Data: Postop thoracic aneurysm repair.  PORTABLE CHEST - 1 VIEW  Comparison: 07/06/2012.  Findings: 1523 hours.  Interval revision of the median sternotomy.  Prosthetic aortic valve appears grossly unchanged.  Endotracheal tube tip is just above the carina.  Nasogastric tube projects into the proximal stomach.  Right IJ Swan-Ganz catheter tip is in the right ventricular outflow tract or main pulmonary artery.  There are bilateral chest tubes.  The heart size and mediastinal contours are stable.  There is bilateral perihilar and left lower lobe atelectasis.  No pneumothorax or significant pleural effusion is seen.  IMPRESSION: Interval revision of median sternotomy with support system positioned as above.  The endotracheal tube is low and should be retracted approximately 3 cm to be in the mid trachea.  Bibasilar atelectasis.   Original Report Authenticated By: Richardean Sale, M.D.     Assessment:  1. Active Problems: 2.  * No active hospital problems. *  3.   Plan:  1. Watch for excessive bradycardia - she still has pacing wires in place for the short term, but I wonder whether she will tolerate amiodarone once dopamine is discontinued and her junctional rhythm slows down. Otherwise good postop progress.  Time Spent Directly with Patient:   Length of Stay:  LOS: 2 days    Genetta Fiero 07/09/2012, 8:19 AM

## 2012-07-09 NOTE — Progress Notes (Signed)
Patient ambulated 4 feet in room.  States she felt dizzy and nauseous, denies SOB, Sats 87%.  Patient assisted to chair.  Zofran given for nausea.  Sats remained in high 80's on 50% venti mask.  Placed on partial non-rebreather; sats increased to 96% after several minutes.  Will continue to monitor.  Perfecto Kingdom, RN

## 2012-07-09 NOTE — Progress Notes (Addendum)
Patient's heart rate increased to 140s.  Patient complains of feeling "heart pounding."  EKG shows atrial fib.  MD Roxy Manns made aware.  Orders received.  150mg  bolus amiodarone given; continuous drip started.   Will continue to monitor.

## 2012-07-09 NOTE — Progress Notes (Signed)
2 Days Post-Op Procedure(s) (LRB): REPLACEMENT ASCENDING AORTA (N/A) REDO STERNOTOMY (N/A) Subjective: Resection + grafting 5.5 cm ascending TAA Postop afib , low BP,low O2 sats with low lung volumes  Objective: Vital signs in last 24 hours: Temp:  [97.6 F (36.4 C)-98.6 F (37 C)] 98.3 F (36.8 C) (12/05 0740) Pulse Rate:  [56-139] 65  (12/05 0830) Cardiac Rhythm:  [-] Normal sinus rhythm (12/05 0800) Resp:  [14-32] 23  (12/05 0830) BP: (70-98)/(36-57) 90/47 mmHg (12/05 0830) SpO2:  [90 %-100 %] 93 % (12/05 0830) FiO2 (%):  [50 %] 50 % (12/05 0600) Weight:  [146 lb 13.2 oz (66.6 kg)] 146 lb 13.2 oz (66.6 kg) (12/05 0500)  Hemodynamic parameters for last 24 hours:  rate controlled AFIB  Intake/Output from previous day: 12/04 0701 - 12/05 0700 In: 2811.9 [P.O.:1080; I.V.:1071.9; IV Piggyback:660] Out: 2620 [Urine:2160; Chest Tube:460] Intake/Output this shift: Total I/O In: 72.2 [I.V.:70.2; IV Piggyback:2] Out: 10 [Urine:10]  Neuro intact Warm extremities, good pulses  Lab Results:  Basename 07/09/12 0359 07/08/12 1617 07/08/12 1615  WBC 12.6* -- 14.7*  HGB 9.0* 8.8* --  HCT 25.9* 26.0* --  PLT 86* -- 85*   BMET:  Basename 07/09/12 0359 07/08/12 1617 07/08/12 0410  NA 136 138 --  K 3.7 4.4 --  CL 98 100 --  CO2 26 -- 25  GLUCOSE 147* 132* --  BUN 12 12 --  CREATININE 1.25* 1.20* --  CALCIUM 6.6* -- 6.8*    PT/INR:  Basename 07/07/12 1535  LABPROT 15.4*  INR 1.24   ABG    Component Value Date/Time   PHART 7.342* 07/08/2012 0353   HCO3 25.4* 07/08/2012 0353   TCO2 25 07/08/2012 1617   ACIDBASEDEF 1.0 07/08/2012 0353   O2SAT 93.0 07/08/2012 0353   CBG (last 3)   Basename 07/09/12 0738 07/09/12 0340 07/09/12 0013  GLUCAP 121* 147* 145*    Assessment/Plan: S/P Procedure(s) (LRB): REPLACEMENT ASCENDING AORTA (N/A) REDO STERNOTOMY (N/A) keep in ICU, wean pressors,IV amiodarone   LOS: 2 days    VAN TRIGT III,PETER 07/09/2012

## 2012-07-09 NOTE — Progress Notes (Signed)
Patient ID: [name] [name], female   DOB: 10/06/1940, 70 y.o.   MRN: [id number]  SICU Evening Rounds:  Hemodynamically stable.  Neo down to 20 mcg. A-paced at 30. On amio  Urine output ok. Creat up slightly. BMET    Component Value Date/Time   NA 133* 07/09/2012 1704   K 3.9 07/09/2012 1704   CL 97 07/09/2012 1704   CO2 26 07/09/2012 1704   GLUCOSE 132* 07/09/2012 1704   BUN 13 07/09/2012 1704   CREATININE 1.38* 07/09/2012 1704   CALCIUM 7.5* 07/09/2012 1704   GFRNONAA 37* 07/09/2012 1704   GFRAA 43* 07/09/2012 1704    A/P:  Stable. Continue present course.

## 2012-07-10 ENCOUNTER — Inpatient Hospital Stay (HOSPITAL_COMMUNITY): Payer: Medicare Other

## 2012-07-10 DIAGNOSIS — I480 Paroxysmal atrial fibrillation: Secondary | ICD-10-CM | POA: Diagnosis not present

## 2012-07-10 DIAGNOSIS — N289 Disorder of kidney and ureter, unspecified: Secondary | ICD-10-CM

## 2012-07-10 DIAGNOSIS — IMO0001 Reserved for inherently not codable concepts without codable children: Secondary | ICD-10-CM

## 2012-07-10 DIAGNOSIS — Z9889 Other specified postprocedural states: Secondary | ICD-10-CM

## 2012-07-10 LAB — GLUCOSE, CAPILLARY
Glucose-Capillary: 113 mg/dL — ABNORMAL HIGH (ref 70–99)
Glucose-Capillary: 105 mg/dL — ABNORMAL HIGH (ref 70–99)
Glucose-Capillary: 97 mg/dL (ref 70–99)
Glucose-Capillary: 105 mg/dL — ABNORMAL HIGH (ref 70–99)
Glucose-Capillary: 81 mg/dL (ref 70–99)
Glucose-Capillary: 62 mg/dL — ABNORMAL LOW (ref 70–99)

## 2012-07-10 LAB — COMPREHENSIVE METABOLIC PANEL
ALT: 35 U/L (ref 0–35)
AST: 45 U/L — ABNORMAL HIGH (ref 0–37)
Albumin: 2.8 g/dL — ABNORMAL LOW (ref 3.5–5.2)
Alkaline Phosphatase: 66 U/L (ref 39–117)
BUN: 13 mg/dL (ref 6–23)
CO2: 27 mEq/L (ref 19–32)
Calcium: 7.5 mg/dL — ABNORMAL LOW (ref 8.4–10.5)
Chloride: 96 mEq/L (ref 96–112)
Creatinine, Ser: 1.35 mg/dL — ABNORMAL HIGH (ref 0.50–1.10)
GFR calc Af Amer: 44 mL/min — ABNORMAL LOW (ref >90)
GFR calc non Af Amer: 38 mL/min — ABNORMAL LOW (ref >90)
Glucose, Bld: 106 mg/dL — ABNORMAL HIGH (ref 70–99)
Potassium: 3.7 mEq/L (ref 3.5–5.1)
Sodium: 132 mEq/L — ABNORMAL LOW (ref 135–145)
Total Bilirubin: 1.7 mg/dL — ABNORMAL HIGH (ref 0.3–1.2)
Total Protein: 5.4 g/dL — ABNORMAL LOW (ref 6.0–8.3)

## 2012-07-10 LAB — CBC
HCT: 24.2 % — ABNORMAL LOW (ref 36.0–46.0)
Hemoglobin: 8.5 g/dL — ABNORMAL LOW (ref 12.0–15.0)
MCH: 31.6 pg (ref 26.0–34.0)
MCHC: 35.1 g/dL (ref 30.0–36.0)
MCV: 90 fL (ref 78.0–100.0)
Platelets: 91 10*3/uL — ABNORMAL LOW (ref 150–400)
RBC: 2.69 MIL/uL — ABNORMAL LOW (ref 3.87–5.11)
RDW: 15.7 % — ABNORMAL HIGH (ref 11.5–15.5)
WBC: 11 10*3/uL — ABNORMAL HIGH (ref 4.0–10.5)

## 2012-07-10 MED ORDER — AMIODARONE HCL 200 MG PO TABS
200.0000 mg | ORAL_TABLET | Freq: Two times a day (BID) | ORAL | Status: DC
  Administered 2012-07-10: 200 mg via ORAL
  Filled 2012-07-10 (×3): qty 1

## 2012-07-10 MED ORDER — ACETAMINOPHEN 325 MG PO TABS
650.0000 mg | ORAL_TABLET | Freq: Four times a day (QID) | ORAL | Status: DC | PRN
  Filled 2012-07-10: qty 2

## 2012-07-10 MED ORDER — AMIODARONE HCL 200 MG PO TABS
200.0000 mg | ORAL_TABLET | Freq: Two times a day (BID) | ORAL | Status: DC
  Administered 2012-07-10: 200 mg via ORAL
  Filled 2012-07-10 (×4): qty 1

## 2012-07-10 MED ORDER — MOVING RIGHT ALONG BOOK
Freq: Once | Status: AC
  Administered 2012-07-10: 16:00:00
  Filled 2012-07-10: qty 1

## 2012-07-10 MED ORDER — MAGNESIUM HYDROXIDE 400 MG/5ML PO SUSP: 30.0000 mL | Freq: Every day | ORAL | Status: DC | PRN

## 2012-07-10 MED ORDER — SODIUM CHLORIDE 0.9 % IJ SOLN
3.0000 mL | Freq: Two times a day (BID) | INTRAMUSCULAR | Status: DC
  Administered 2012-07-10 – 2012-07-16 (×6): 3 mL via INTRAVENOUS

## 2012-07-10 MED ORDER — ALBUMIN HUMAN 5 % IV SOLN
INTRAVENOUS | Status: AC
  Administered 2012-07-10: 12.5 g via INTRAVENOUS
  Filled 2012-07-10: qty 250

## 2012-07-10 MED ORDER — SODIUM CHLORIDE 0.9 % IV SOLN
250.0000 mL | INTRAVENOUS | Status: DC | PRN
  Administered 2012-07-15: 11:00:00 via INTRAVENOUS
  Administered 2012-07-15: 500 mL via INTRAVENOUS

## 2012-07-10 MED ORDER — LACTULOSE 10 GM/15ML PO SOLN
30.0000 g | Freq: Every day | ORAL | Status: DC | PRN
  Filled 2012-07-10: qty 45

## 2012-07-10 MED ORDER — INSULIN ASPART 100 UNIT/ML ~~LOC~~ SOLN
0.0000 [IU] | Freq: Three times a day (TID) | SUBCUTANEOUS | Status: DC
  Administered 2012-07-11: 2 [IU] via SUBCUTANEOUS

## 2012-07-10 MED ORDER — FUROSEMIDE 40 MG PO TABS
40.0000 mg | ORAL_TABLET | Freq: Every day | ORAL | Status: DC
  Administered 2012-07-10 – 2012-07-19 (×9): 40 mg via ORAL
  Filled 2012-07-10 (×10): qty 1

## 2012-07-10 MED ORDER — SODIUM CHLORIDE 0.9 % IJ SOLN: 3.0000 mL | INTRAMUSCULAR | Status: DC | PRN

## 2012-07-10 MED ORDER — POTASSIUM CHLORIDE CRYS ER 20 MEQ PO TBCR
20.0000 meq | EXTENDED_RELEASE_TABLET | Freq: Every day | ORAL | Status: DC
  Administered 2012-07-10 – 2012-07-19 (×9): 20 meq via ORAL
  Filled 2012-07-10 (×10): qty 1

## 2012-07-10 NOTE — Progress Notes (Signed)
3 Days Post-Op Procedure(s) (LRB): REPLACEMENT ASCENDING AORTA (N/A) REDO STERNOTOMY (N/A) Subjective:  [name] [name] has no complaints this morning.  She has ambulated.  No BM  Objective: Vital signs in last 24 hours: Temp:  [97.7 F (36.5 C)-98.4 F (36.9 C)] 98.4 F (36.9 C) (12/06 0733) Pulse Rate:  [55-88] 88  (12/06 0600) Cardiac Rhythm:  [-] Atrial paced (12/06 0600) Resp:  [12-26] 12  (12/06 0600) BP: (82-120)/(34-66) 108/60 mmHg (12/06 0600) SpO2:  [92 %-100 %] 100 % (12/06 0600) Weight:  [147 lb 4.3 oz (66.8 kg)] 147 lb 4.3 oz (66.8 kg) (12/06 0500)  Intake/Output from previous day: 12/05 0701 - 12/06 0700 In: 1961 [P.O.:300; I.V.:1153; IV [id number] Out: 2030 [Urine:2010; Chest Tube:20]  General appearance: alert, cooperative and no distress Heart: regular rate and rhythm Lungs: clear to auscultation bilaterally Abdomen: soft, non-tender; bowel sounds normal; no masses,  no organomegaly Extremities: edema trace Wound: clean and dry  Lab Results:  Basename 07/10/12 0410 07/09/12 0359  WBC 11.0* 12.6*  HGB 8.5* 9.0*  HCT 24.2* 25.9*  PLT 91* 86*   BMET:  Basename 07/10/12 0410 07/09/12 1704  NA 132* 133*  K 3.7 3.9  CL 96 97  CO2 27 26  GLUCOSE 106* 132*  BUN 13 13  CREATININE 1.35* 1.38*  CALCIUM 7.5* 7.5*    PT/INR:  Basename 07/07/12 1535  LABPROT 15.4*  INR 1.24   ABG    Component Value Date/Time   PHART 7.342* 07/08/2012 0353   HCO3 25.4* 07/08/2012 0353   TCO2 25 07/08/2012 1617   ACIDBASEDEF 1.0 07/08/2012 0353   O2SAT 93.0 07/08/2012 0353   CBG (last 3)   Basename 07/10/12 0413 07/09/12 2319 07/09/12 1931  GLUCAP 105* 113* 106*    Assessment/Plan: S/P Procedure(s) (LRB): REPLACEMENT ASCENDING AORTA (N/A) REDO STERNOTOMY (N/A)  1. CV- previous Atrial fibrillation, currently NSR- will stop IV Amiodarone, Neo- start PO Amiodarone, will hold beta blocker for now 2. Pulm- on oxygen, wean as tolerated, CXR shows some improvement in  atelectasis, small left pleural effusion 3. Renal- creatinine slowly trending up, will monitor 4. Volume Overload- weight is up 7kg since admission, continue diuresis 5. LOC constipation- lactulose prn 6. Dispo- to step down today   LOS: 3 days    Ahmed Prima, Junie Panning 07/10/2012

## 2012-07-10 NOTE — Progress Notes (Signed)
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 Postop afib, low O2 sats  Objective: Vital signs in last 24 hours: Temp:  [97.7 F (36.5 C)-98.4 F (36.9 C)] 98.4 F (36.9 C) (12/06 0733) Pulse Rate:  [55-88] 88  (12/06 0600) Cardiac Rhythm:  [-] Atrial paced (12/06 0600) Resp:  [12-26] 12  (12/06 0600) BP: (85-120)/(34-66) 108/60 mmHg (12/06 0600) SpO2:  [92 %-100 %] 96 % (12/06 0758) Weight:  [147 lb 4.3 oz (66.8 kg)] 147 lb 4.3 oz (66.8 kg) (12/06 0500)  Hemodynamic parameters for last 24 hours:  NSR currently  Intake/Output from previous day: 12/05 0701 - 12/06 0700 In: 1961 [P.O.:300; I.V.:1153; IV [id number] Out: 2030 [Urine:2010; Chest Tube:20] Intake/Output this shift:  adequate  Lungs clear extrem warm  Lab Results:  [facility] 07/10/12 0410 07/09/12 0359  WBC 11.0* 12.6*  HGB 8.5* 9.0*  HCT 24.2* 25.9*  PLT 91* 86*   BMET:  Basename 07/10/12 0410 07/09/12 1704  NA 132* 133*  K 3.7 3.9  CL 96 97  CO2 27 26  GLUCOSE 106* 132*  BUN 13 13  CREATININE 1.35* 1.38*  CALCIUM 7.5* 7.5*    PT/INR:  Basename 07/07/12 1535  LABPROT 15.4*  INR 1.24   ABG    Component Value Date/Time   PHART 7.342* 07/08/2012 0353   HCO3 25.4* 07/08/2012 0353   TCO2 25 07/08/2012 1617   ACIDBASEDEF 1.0 07/08/2012 0353   O2SAT 93.0 07/08/2012 0353   CBG (last 3)   Basename 07/10/12 0413 07/09/12 2319 07/09/12 1931  GLUCAP 105* 113* 106*    Assessment/Plan: S/P Procedure(s) (LRB): REPLACEMENT ASCENDING AORTA (N/A) REDO STERNOTOMY (N/A) Plan for transfer to step-down: see transfer orders Po iron for preop anemia., po amio, hold B blockers due to sinus brady  LOS: 3 days    VAN TRIGT III,PETER 07/10/2012

## 2012-07-10 NOTE — Progress Notes (Signed)
Subjective:  Awake and alert  Objective:  Vital Signs in the last 24 hours: Temp:  [97.5 F (36.4 C)-98.4 F (36.9 C)] 97.5 F (36.4 C) (12/06 1128) Pulse Rate:  [55-88] 88  (12/06 1100) Resp:  [12-26] 16  (12/06 1100) BP: (86-120)/(39-66) 88/46 mmHg (12/06 1100) SpO2:  [92 %-100 %] 94 % (12/06 1100) Weight:  [66.8 kg (147 lb 4.3 oz)] 66.8 kg (147 lb 4.3 oz) (12/06 0500)  Intake/Output from previous day:  Intake/Output Summary (Last 24 hours) at 07/10/12 1203 Last data filed at 07/10/12 1000  Gross per 24 hour  Intake 1419.62 ml  Output   1950 ml  Net -530.38 ml    Physical Exam: General appearance: alert, cooperative and no distress Lungs: decreased breath sounds Heart: regular rate and rhythm and anterior rub   Rate: 80  Rhythm: paced  Lab Results:  Basename 07/10/12 0410 07/09/12 0359  WBC 11.0* 12.6*  HGB 8.5* 9.0*  PLT 91* 86*    Basename 07/10/12 0410 07/09/12 1704  NA 132* 133*  K 3.7 3.9  CL 96 97  CO2 27 26  GLUCOSE 106* 132*  BUN 13 13  CREATININE 1.35* 1.38*   No results found for this basename: TROPONINI:2,CK,MB:2 in the last 72 hours Hepatic Function Panel  Basename 07/10/12 0410  PROT 5.4*  ALBUMIN 2.8*  AST 45*  ALT 35  ALKPHOS 66  BILITOT 1.7*  BILIDIR --  IBILI --   No results found for this basename: CHOL in the last 72 hours  Basename 07/07/12 1535  INR 1.24    Imaging: Imaging results have been reviewed  Cardiac Studies:  Assessment/Plan:   Active Problems:  Aortic root dilatation  aortic root repair 07/08/12  PAF (paroxysmal atrial fibrillation) post Aortic root replacement- Amio  Normal coronary arteries 2004, low risk Myoview 3/12  Acute renal insufficiency post op( SCr 1.38)   Plan- Will follow, now on PO Amiodarone   Kerin Ransom PA-C 07/10/2012, 12:03 PM

## 2012-07-10 NOTE — Progress Notes (Addendum)
I have seen and evaluated the patient this morning along with Kerin Ransom, PA. I agree with his findings, examination as well as impression recommendations.  Relatively stable POD #3.  Off Pressors.   BPs in high 80s-90s with HR 80s-low (SR is 60s, paced in 80s) on PO Amiodarone without BB due to previous concerns for bradycardia.  On low dose Oral Lasix.  Will follow along.  Anticipate transfer to Tele today per Dr. Lucianne Lei Trigt's note.  Leonie Man, M.D., M.S. [facility] [address] [place], [place]  [address]  [phone] Pager # [phone] 07/10/2012 12:29 PM

## 2012-07-10 NOTE — Evaluation (Signed)
Physical Therapy Evaluation Patient Details Name: [name] [name] MRN: [id number] DOB: 1940-12-05 Today's Date: 07/10/2012 Time: [id number] PT Time Calculation (min): 31 min  PT Assessment / Plan / Recommendation Clinical Impression  pt s/p ascending aorta for aortic aneurysm.  pt improving well at time of evaluation, but is still weak and deconditioned with need for O2.  As stated below, pt dropped to 82-86% on RA with amb.  Pt can benefit from PT to maximize I.    PT Assessment  Patient needs continued PT services    Follow Up Recommendations  Home health PT    Does the patient have the potential to tolerate intense rehabilitation      Barriers to Discharge        Equipment Recommendations  None recommended by PT    Recommendations for Other Services     Frequency Min 3X/week    Precautions / Restrictions Precautions Precautions: None Restrictions Weight Bearing Restrictions: No   Pertinent Vitals/Pain EHR 100-103; SaO2 on RA dropped to 82 min, but generally stayed at 86%.  Unable to reach 88-90 with just efficient breathing.  Return to 95% quickly on 2L O2 [place].      Mobility  Bed Mobility Bed Mobility: Supine to Sit;Sitting - Scoot to [employer] of Bed (Independent bridging) Supine to Sit: 4: Min assist;HOB flat Sitting - Scoot to [employer] of Bed: 4: Min guard Details for Bed Mobility Assistance: reinforced sternal precautions; pt needed assist due to mild pain and no use of UE's Transfers Transfers: Sit to Stand;Stand to Sit Sit to Stand: 4: Min assist;From bed;Without upper extremity assist Stand to Sit: 4: Min guard;Without upper extremity assist;To bed Details for Transfer Assistance: reinforced sternal prec as they relate to transfers.  mild lifting assist Ambulation/Gait Ambulation/Gait Assistance: 4: Min assist Ambulation Distance (Feet): 110 Feet Assistive device: 1 person hand held assist Ambulation/Gait Assistance Details: generally steady gait and although  asymptomatic, sats dropped to lower/mid 80's on RA  EHR 103.  Quick return to 97 after reapplying 2 L [employer] Gait Pattern: Step-through pattern Stairs: No    Shoulder Instructions     Exercises     PT Diagnosis: Generalized weakness;Other (comment) (decr activity tolerance)  PT Problem List: Decreased strength;Decreased activity tolerance;Decreased mobility;Cardiopulmonary status limiting activity PT Treatment Interventions: Gait training;Functional mobility training;Therapeutic activities;Patient/family education;Stair training   PT Goals Acute Rehab PT Goals PT Goal Formulation: With patient Time For Goal Achievement: 07/24/12 Potential to Achieve Goals: Good Pt will go Supine/Side to Sit: with supervision PT Goal: Supine/Side to Sit - Progress: Goal set today Pt will go Sit to Stand: with supervision PT Goal: Sit to Stand - Progress: Goal set today Pt will Transfer Bed to Chair/Chair to Bed: with supervision PT Transfer Goal: Bed to Chair/Chair to Bed - Progress: Goal set today Pt will Ambulate: 51 - 150 feet;with supervision;with least restrictive assistive device PT Goal: Ambulate - Progress: Goal set today Pt will Go Up / Down Stairs: 3-5 stairs;with supervision;with rail(s) PT Goal: Up/Down Stairs - Progress: Goal set today Additional Goals Additional Goal #1: demonstrates understanding of sternal prec during mobility PT Goal: Additional Goal #1 - Progress: Goal set today  Visit Information  Last PT Received On: 07/10/12 Assistance Needed: +1    Subjective Data  Subjective: Just to let you get the picture, I was showing a horse 3 days ago. Patient Stated Goal: Home, Independent   Prior Functioning  Home Living Lives With: Spouse Available Help at Discharge: Family Type of  Home: House Home Access: Stairs to enter [employer] of Steps: 2 Entrance Stairs-Rails: None Home Layout: Two level;Bed/bath upstairs Alternate Level Stairs-Number of Steps:  12 Alternate Level Stairs-Rails: Right;Left Bathroom Shower/Tub: Walk-in shower;Tub only Bathroom Toilet: Handicapped height Home Adaptive Equipment: None Prior Function Level of Independence: Independent Able to Take Stairs?: Yes Driving: Yes Communication Communication: No difficulties Dominant Hand: Right    Cognition  Overall Cognitive Status: Appears within functional limits for tasks assessed/performed Arousal/Alertness: Awake/alert Orientation Level: Appears intact for tasks assessed Behavior During Session: [facility] for tasks performed    Extremity/Trunk Assessment Right Lower Extremity Assessment RLE ROM/Strength/Tone: [facility] for tasks assessed;Deficits RLE ROM/Strength/Tone Deficits: hip flexor weakness bil; otherwise grossly 4/5 or better Left Lower Extremity Assessment LLE ROM/Strength/Tone: [facility] for tasks assessed;Deficits LLE ROM/Strength/Tone Deficits: hip flexors weak at 4/5 o/w grossly 4/5 or better   Balance Balance Balance Assessed: No  End of Session PT - End of Session Activity Tolerance: Patient tolerated treatment well Patient left: in bed;with call bell/phone within reach Nurse Communication: Mobility status  GP     [name] [name], [name] [name] 07/10/2012, 5:58 PM  07/10/2012  Donnella Sham, PT [phone] (pager)

## 2012-07-11 LAB — GLUCOSE, CAPILLARY
Glucose-Capillary: 58 mg/dL — ABNORMAL LOW (ref 70–99)
Glucose-Capillary: 79 mg/dL (ref 70–99)
Glucose-Capillary: 126 mg/dL — ABNORMAL HIGH (ref 70–99)
Glucose-Capillary: 76 mg/dL (ref 70–99)
Glucose-Capillary: 104 mg/dL — ABNORMAL HIGH (ref 70–99)

## 2012-07-11 LAB — TYPE AND SCREEN
ABO/RH(D): B NEG
Antibody Screen: NEGATIVE
Unit division: 0
Unit division: 0
Unit division: 0
Unit division: 0
Unit division: 0
Unit division: 0
Unit division: 0
Unit division: 0
Unit division: 0
Unit division: 0

## 2012-07-11 LAB — BASIC METABOLIC PANEL
BUN: 14 mg/dL (ref 6–23)
CO2: 27 mEq/L (ref 19–32)
Calcium: 8.6 mg/dL (ref 8.4–10.5)
Chloride: 95 mEq/L — ABNORMAL LOW (ref 96–112)
Creatinine, Ser: 1.23 mg/dL — ABNORMAL HIGH (ref 0.50–1.10)
GFR calc Af Amer: 50 mL/min — ABNORMAL LOW (ref >90)
GFR calc non Af Amer: 43 mL/min — ABNORMAL LOW (ref >90)
Glucose, Bld: 68 mg/dL — ABNORMAL LOW (ref 70–99)
Potassium: 3.9 mEq/L (ref 3.5–5.1)
Sodium: 130 mEq/L — ABNORMAL LOW (ref 135–145)

## 2012-07-11 LAB — CBC
HCT: 22.4 % — ABNORMAL LOW (ref 36.0–46.0)
Hemoglobin: 7.9 g/dL — ABNORMAL LOW (ref 12.0–15.0)
MCH: 31.9 pg (ref 26.0–34.0)
MCHC: 35.3 g/dL (ref 30.0–36.0)
MCV: 90.3 fL (ref 78.0–100.0)
Platelets: 99 10*3/uL — ABNORMAL LOW (ref 150–400)
RBC: 2.48 MIL/uL — ABNORMAL LOW (ref 3.87–5.11)
RDW: 15.3 % (ref 11.5–15.5)
WBC: 6.6 10*3/uL (ref 4.0–10.5)

## 2012-07-11 MED ORDER — AMIODARONE HCL 200 MG PO TABS
400.0000 mg | ORAL_TABLET | Freq: Two times a day (BID) | ORAL | Status: DC
  Administered 2012-07-11 – 2012-07-14 (×8): 400 mg via ORAL
  Filled 2012-07-11 (×10): qty 2

## 2012-07-11 MED ORDER — AMIODARONE LOAD VIA INFUSION
150.0000 mg | Freq: Once | INTRAVENOUS | Status: AC
  Administered 2012-07-11: 150 mg via INTRAVENOUS
  Filled 2012-07-11 (×3): qty 83.34

## 2012-07-11 NOTE — Progress Notes (Signed)
Spoke with TCTS and [place], patient still afib 120-130's, asymptomatic, no more pauses since this am. No new orders at this time. Will cont to monitor patient closely.

## 2012-07-11 NOTE — Progress Notes (Signed)
[facility]  Subjective: Some SOB and right posterior shoulder pain.  No orthopnea.  Objective: Vital signs in last 24 hours: Temp:  [97.5 F (36.4 C)-98.6 F (37 C)] 98.4 F (36.9 C) (12/07 0448) Pulse Rate:  [85-88] 85  (12/07 0448) Resp:  [14-22] 18  (12/07 0448) BP: (82-102)/(42-61) 92/53 mmHg (12/07 0448) SpO2:  [93 %-99 %] 93 % (12/07 0448) Weight:  [65.681 kg (144 lb 12.8 oz)] 65.681 kg (144 lb 12.8 oz) (12/07 0448) Last BM Date:  (PTA)  Intake/Output from previous day: 12/06 0701 - 12/07 0700 In: 1172.4 [P.O.:755; I.V.:65.4; IV Piggyback:352] Out: 1001 [Urine:1000; Stool:1] Intake/Output this shift:    Medications Current Facility-Administered Medications  Medication Dose Route Frequency Provider Last Rate Last Dose  . 0.9 %  sodium chloride infusion  250 mL Intravenous PRN Ivin Poot, MD      . acetaminophen (TYLENOL) tablet 1,000 mg  1,000 mg Oral Q6H Nani Skillern, PA   1,000 mg at 07/09/12 1759   Or  . acetaminophen (TYLENOL) solution 975 mg  975 mg Per Tube Q6H Donielle Liston Alba, PA      . acetaminophen (TYLENOL) tablet 650 mg  650 mg Oral Q6H PRN Ivin Poot, MD      . Margrett Rud albumin human 5 % solution 12.5 g  12.5 g Intravenous Once Ivin Poot, MD   12.5 g at 07/10/12 1431  . amiodarone (NEXTERONE) 1.8 mg/mL load via infusion 150 mg  150 mg Intravenous Once Coolidge Breeze, PA      . amiodarone (PACERONE) tablet 200 mg  200 mg Oral BID Ivin Poot, MD   200 mg at 07/10/12 2132  . amiodarone (PACERONE) tablet 200 mg  200 mg Oral BID [name] Barrett, PA   200 mg at 07/10/12 0926  . beta carotene w/minerals (OCUVITE) tablet 1 tablet  1 tablet Oral Daily Nani Skillern, PA   1 tablet at 07/10/12 1033  . bisacodyl (DULCOLAX) EC tablet 10 mg  10 mg Oral Daily Nani Skillern, PA   10 mg at 07/10/12 [id number]   Or  . bisacodyl (DULCOLAX) suppository 10 mg  10 mg Rectal Daily Nani Skillern, PA       . budesonide-formoterol (SYMBICORT) 160-4.5 MCG/ACT inhaler 2 puff  2 puff Inhalation BID Ivin Poot, MD   2 puff at 07/11/12 [phone]  . docusate sodium (COLACE) capsule 200 mg  200 mg Oral Daily Nani Skillern, PA   200 mg at 07/10/12 0917  . ferrous [id number] C-folic acid (TRINSICON / FOLTRIN) capsule 1 capsule  1 capsule Oral TID PC Ivin Poot, MD   1 capsule at 07/10/12 1655  . furosemide (LASIX) tablet 40 mg  40 mg Oral Daily Ivin Poot, MD   40 mg at 07/10/12 0917  . insulin aspart (novoLOG) injection 0-24 Units  0-24 Units Subcutaneous TID AC & HS Ivin Poot, MD      . insulin glargine (LANTUS) injection 12 Units  12 Units Subcutaneous Daily Ivin Poot, MD   12 Units at 07/10/12 1034  . lactulose (CHRONULAC) 10 GM/15ML solution 30 g  30 g Oral Daily PRN [name] Barrett, PA      . levalbuterol (XOPENEX) nebulizer solution 1.25 mg  1.25 mg Nebulization TID Ivin Poot, MD   1.25 mg at 07/11/12 0837  . magnesium hydroxide (MILK OF MAGNESIA) suspension 30 mL  30 mL Oral Daily PRN  Ivin Poot, MD      . metoprolol [facility]) injection 2.5-5 mg  2.5-5 mg Intravenous Q2H PRN Nani Skillern, PA      . [COMPLETED] moving right along book   Does not apply Once Ivin Poot, MD      . ondansetron [facility]) injection 4 mg  4 mg Intravenous Q6H PRN Nani Skillern, PA   4 mg at 07/10/12 0917  . pantoprazole (PROTONIX) EC tablet 40 mg  40 mg Oral Daily Nani Skillern, PA   40 mg at 07/10/12 0917  . polyvinyl alcohol (LIQUIFILM TEARS) 1.4 % ophthalmic solution 1 drop  1 drop Both Eyes Daily PRN Ivin Poot, MD      . potassium chloride SA (K-DUR,KLOR-CON) CR tablet 20 mEq  20 mEq Oral Daily Ivin Poot, MD   20 mEq at 07/10/12 0917  . promethazine (PHENERGAN) injection 12.5 mg  12.5 mg Intravenous Q6H PRN Ivin Poot, MD      . sodium chloride 0.9 % injection 3 mL  3 mL Intravenous Q12H Nani Skillern, PA   3 mL at 07/10/12  2133  . sodium chloride 0.9 % injection 3 mL  3 mL Intravenous PRN Nani Skillern, PA   3 mL at 07/09/12 0943  . sodium chloride 0.9 % injection 3 mL  3 mL Intravenous Q12H Ivin Poot, MD   3 mL at 07/10/12 2132  . sodium chloride 0.9 % injection 3 mL  3 mL Intravenous PRN Ivin Poot, MD      . SUMAtriptan [facility]) tablet 50 mg  50 mg Oral BID PRN Ivin Poot, MD      . traMADol Veatrice Bourbon) tablet 50 mg  50 mg Oral Q6H PRN Ivin Poot, MD   50 mg at 07/11/12 0256    PE: General appearance: alert, cooperative and no distress Lungs: + wheeze left side. 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 Currently in Afib RVR.  Agree with amio bolus.  Currently getting 400mg  PO Bid.  Hgb 7.9.  Will be transfused today.  Need to consider anticoagulation when Hgb stablizes.   Not on beta blocker secondary to hypotension.   LOS: 4 days    HAGER, BRYAN 07/11/2012 8:39 AM  Patient seen and examined. Agree with assessment and plan. 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 Currently in AF getting an additional amiodarone bolus. For PRBC  transfusion.   Troy Sine, MD, [facility] 07/11/2012 8:56 AM

## 2012-07-11 NOTE — Progress Notes (Signed)
Patient ambulating in room to [facility] approximately 10 ft, still afib, heart rate 120's, patient remains asymptomatic.

## 2012-07-11 NOTE — Progress Notes (Addendum)
[address]            [place], [address]          [phone]     4 Days Post-Op Procedure(s) (LRB): REPLACEMENT ASCENDING AORTA (N/A) REDO STERNOTOMY (N/A)  Subjective: Pt went back into rapid AF this am, rates 120s.  She is asymptomatic.  Still weak, no appetite.  +BM   Objective: Vital signs in last 24 hours: Patient Vitals for the past 24 hrs:  BP Temp Temp src Pulse Resp SpO2 Weight  07/11/12 0448 92/53 mmHg 98.4 F (36.9 C) Oral 85  18  93 % 144 lb 12.8 oz (65.681 kg)  07/10/12 2107 99/47 mmHg 98.1 F (36.7 C) Oral 87  18  95 % -  07/10/12 2042 - - - - - 96 % -  07/10/12 1842 96/49 mmHg - - 88  - - -  07/10/12 1605 89/45 mmHg 98.6 F (37 C) Oral 88  - 96 % -  07/10/12 1500 97/57 mmHg - - 87  20  97 % -  07/10/12 1415 98/53 mmHg - - 88  14  98 % -  07/10/12 1400 82/55 mmHg - - 87  19  96 % -  07/10/12 1316 - - - - - 95 % -  07/10/12 1315 94/48 mmHg - - 87  19  94 % -  07/10/12 1300 84/52 mmHg - - 87  15  99 % -  07/10/12 1215 94/52 mmHg - - 88  20  95 % -  07/10/12 1200 85/46 mmHg - - 87  22  96 % -  07/10/12 1128 - 97.5 F (36.4 C) Oral - - - -  07/10/12 1100 88/46 mmHg - - 88  16  94 % -  07/10/12 1000 87/42 mmHg - - 87  16  98 % -  07/10/12 0900 102/61 mmHg - - - 21  - -   Current Weight  07/11/12 144 lb 12.8 oz (65.681 kg)     Intake/Output from previous day: 12/06 0701 - 12/07 0700 In: 1172.4 [P.O.:755; I.V.:65.4; IV Piggyback:352] Out: 1001 [Urine:1000; Stool:1]  CBGs 81-62-68-79  PHYSICAL EXAM:  Heart: Irr irr, rates 100-120 Lungs: Clear Wound: Clean and dry Extremities: Trace LE edema    Lab Results: CBC: Basename 07/11/12 0510 07/10/12 0410  WBC 6.6 11.0*  HGB 7.9* 8.5*  HCT 22.4* 24.2*  PLT 99* 91*   BMET:  Basename 07/11/12 0510 07/10/12 0410  NA 130* 132*  K 3.9 3.7  CL 95* 96  CO2 27 27  GLUCOSE 68* 106*  BUN 14 13  CREATININE 1.23* 1.35*  CALCIUM 8.6 7.5*    PT/INR: No results  found for this basename: LABPROT,INR in the last 72 hours  CXR not done yet secondary to rapid AF  Assessment/Plan: S/P Procedure(s) (LRB): REPLACEMENT ASCENDING AORTA (N/A) REDO STERNOTOMY (N/A)  CV- recurrent AF, on po Amio.  BPs low over past 24 hours, running [id number] systolic.  She has been hypotensive throughout her post op course and has not been started on a beta blocker.  Will re-bolus her with Amio 150 mg IV and watch.  Hopefully we can at least get her rate controlled.   Expected postoperative blood loss anemia- H/H down further today.  In light of her hypotension, will transfuse 1 unit PRBCs this am and monitor.  Cr trending down.  Monitor.  CBGs low normal, pt without h/o DM.  Will d/c Lantus.  Vol overload- diurese.  Mobilize as tolerated.   LOS: 4 days    [name],[name] 07/11/2012   bp little low and hgb lower, one unit prbcs today  I have seen and examined [name] [name] and agree with the above assessment  and plan.  Grace Isaac MD Beeper [phone] Office [phone] 07/11/2012 10:48 AM

## 2012-07-11 NOTE — Progress Notes (Signed)
Patient still in afib with heart rate 120's, three pauses with Sinus brady down into 20-40's and external pacer not pacing at that time, heart quickly returns to 120's with afib. New pacer hooked to EPW. Dr. Servando Snare notified, with new orders. Will cont. To monitor patient closely.

## 2012-07-12 ENCOUNTER — Inpatient Hospital Stay (HOSPITAL_COMMUNITY): Payer: Medicare Other

## 2012-07-12 LAB — BASIC METABOLIC PANEL
BUN: 11 mg/dL (ref 6–23)
Calcium: 9.1 mg/dL (ref 8.4–10.5)
Creatinine, Ser: 1.16 mg/dL — ABNORMAL HIGH (ref 0.50–1.10)
GFR calc non Af Amer: 46 mL/min — ABNORMAL LOW (ref >90)
Glucose, Bld: 109 mg/dL — ABNORMAL HIGH (ref 70–99)

## 2012-07-12 LAB — CBC
Hemoglobin: 10.1 g/dL — ABNORMAL LOW (ref 12.0–15.0)
MCH: 31.1 pg (ref 26.0–34.0)
MCHC: 34.5 g/dL (ref 30.0–36.0)
Platelets: 135 10*3/uL — ABNORMAL LOW (ref 150–400)
RDW: 15.4 % (ref 11.5–15.5)

## 2012-07-12 LAB — TYPE AND SCREEN
ABO/RH(D): B NEG
Antibody Screen: NEGATIVE

## 2012-07-12 LAB — GLUCOSE, CAPILLARY
Glucose-Capillary: 99 mg/dL (ref 70–99)
Glucose-Capillary: 117 mg/dL — ABNORMAL HIGH (ref 70–99)
Glucose-Capillary: 115 mg/dL — ABNORMAL HIGH (ref 70–99)
Glucose-Capillary: 118 mg/dL — ABNORMAL HIGH (ref 70–99)

## 2012-07-12 MED ORDER — NYSTATIN 100000 UNIT/GM EX CREA
TOPICAL_CREAM | Freq: Two times a day (BID) | CUTANEOUS | Status: DC
  Administered 2012-07-12 – 2012-07-16 (×9): via TOPICAL
  Administered 2012-07-17: 1 via TOPICAL
  Administered 2012-07-18 – 2012-07-19 (×3): via TOPICAL
  Filled 2012-07-12 (×2): qty 15

## 2012-07-12 MED ORDER — HYDROCORTISONE 1 % EX CREA
TOPICAL_CREAM | Freq: Three times a day (TID) | CUTANEOUS | Status: DC
  Administered 2012-07-12 – 2012-07-16 (×13): via TOPICAL
  Administered 2012-07-17 (×2): 1 via TOPICAL
  Administered 2012-07-18 – 2012-07-19 (×3): via TOPICAL
  Filled 2012-07-12 (×2): qty 28

## 2012-07-12 NOTE — Progress Notes (Addendum)
                    [address]            [place] [address]          [phone]     5 Days Post-Op Procedure(s) (LRB): REPLACEMENT ASCENDING AORTA (N/A) REDO STERNOTOMY (N/A)  Subjective: Feels dizzy with exertion.  C/o "burning" rash on R forearm and lower back.   Objective: Vital signs in last 24 hours: Patient Vitals for the past 24 hrs:  BP Temp Temp src Pulse Resp SpO2 Weight  07/12/12 0857 - - - - - 96 % -  07/12/12 0431 110/71 mmHg 99 F (37.2 C) Oral 121  19  94 % -  07/12/12 0343 - - - - - - 142 lb 1.6 oz (64.456 kg)  07/11/12 2129 98/64 mmHg 98.9 F (37.2 C) Oral 133  19  90 % -  07/11/12 2006 - - - - - 99 % -  07/11/12 1630 101/61 mmHg 98.3 F (36.8 C) - 130  18  - -  07/11/12 1530 97/62 mmHg 98.3 F (36.8 C) - 130  18  - -  07/11/12 1500 98/65 mmHg 98.6 F (37 C) - 125  18  - -  07/11/12 1432 - - - - - 96 % -  07/11/12 1422 97/67 mmHg 98.6 F (37 C) Oral 128  18  96 % -  07/11/12 1009 110/61 mmHg - - - - - -   Current Weight  07/12/12 142 lb 1.6 oz (64.456 kg)     Intake/Output from previous day: 12/07 0701 - 12/08 0700 In: 192.5 [P.O.:180; Blood:12.5] Out: 1800 [Urine:1800]    PHYSICAL EXAM:  Heart: Tachy, 120-130s Lungs: Clear Wound: Clean and dry Extremities: Trace LE edema.  Erythematous, well circumscribed rash on rt forearm and scattered across lower back.    Lab Results: CBC: Basename 07/12/12 0642 07/11/12 0510  WBC 5.9 6.6  HGB 10.1* 7.9*  HCT 29.3* 22.4*  PLT 135* 99*   BMET:  Basename 07/12/12 0642 07/11/12 0510  NA 132* 130*  K 3.8 3.9  CL 95* 95*  CO2 29 27  GLUCOSE 109* 68*  BUN 11 14  CREATININE 1.16* 1.23*  CALCIUM 9.1 8.6    PT/INR: No results found for this basename: LABPROT,INR in the last 72 hours    Assessment/Plan: S/P Procedure(s) (LRB): REPLACEMENT ASCENDING AORTA (N/A) REDO STERNOTOMY (N/A) CV- HR remains elevated, but on telemetry this am, it looks like mostly atrial flutter  with some sinus tach.  Will check EKG.  Could possible rapid atrial pace if she is in flutter.  Continue po Amio.  May be able to add another agent now that her BP is better (?cardizem).  Cardiology following. Expected postoperative blood loss anemia- improved after tx. Cr at baseline. Rash- cardiology ordered nystatin cream.  Will add hydrocortisone and watch.   LOS: 5 days    [name],[name] 07/12/2012   Now in aflutter Attempt to rapid a pace did not convert Will keep npo in am so if continues can cardiovert I have seen and examined [place] and agree with the above assessment  and plan.  Grace Isaac MD Beeper [phone] Office [phone] 07/12/2012 12:00 PM

## 2012-07-12 NOTE — Progress Notes (Signed)
Pt. Seen and examined. Agree with the NP/PA-C note as written.  Maintains atrial flutter s/p aortic root replacement. Good response to transfusion. Loaded with amiodarone and getting po doses now. Could consider early intervention with TEE/Cardioversion tomorrow - I am in the hospital. Will discuss with CT surgery.  Pixie Casino, MD, [facility] Attending Cardiologist [facility]

## 2012-07-12 NOTE — Progress Notes (Signed)
[facility]  Subjective: Dizzy with ambulation otherwise no complaints.  Objective: Vital signs in last 24 hours: Temp:  [98.3 F (36.8 C)-99 F (37.2 C)] 99 F (37.2 C) (12/08 0431) Pulse Rate:  [121-133] 121  (12/08 0431) Resp:  [18-19] 19  (12/08 0431) BP: (97-110)/(61-71) 110/71 mmHg (12/08 0431) SpO2:  [90 %-99 %] 94 % (12/08 0431) Weight:  [64.456 kg (142 lb 1.6 oz)] 64.456 kg (142 lb 1.6 oz) (12/08 0343) Last BM Date: 07/10/12  Intake/Output from previous day: 12/07 0701 - 12/08 0700 In: 192.5 [P.O.:180; Blood:12.5] Out: 1800 [Urine:1800] Intake/Output this shift:    Medications Current Facility-Administered Medications  Medication Dose Route Frequency Provider Last Rate Last Dose  . 0.9 %  sodium chloride infusion  250 mL Intravenous PRN Ivin Poot, MD      . acetaminophen (TYLENOL) tablet 1,000 mg  1,000 mg Oral Q6H Nani Skillern, PA   1,000 mg at 07/09/12 1759   Or  . acetaminophen (TYLENOL) solution 975 mg  975 mg Per Tube Q6H Donielle Liston Alba, PA      . acetaminophen (TYLENOL) tablet 650 mg  650 mg Oral Q6H PRN Ivin Poot, MD      . Margrett Rud amiodarone (NEXTERONE) 1.8 mg/mL load via infusion 150 mg  150 mg Intravenous Once Coolidge Breeze, PA 500.1 mL/hr at 07/11/12 0914 150 mg at 07/11/12 0914  . amiodarone (PACERONE) tablet 400 mg  400 mg Oral BID Coolidge Breeze, PA   400 mg at 07/11/12 2242  . beta carotene w/minerals (OCUVITE) tablet 1 tablet  1 tablet Oral Daily Nani Skillern, PA   1 tablet at 07/11/12 1003  . bisacodyl (DULCOLAX) EC tablet 10 mg  10 mg Oral Daily Nani Skillern, PA   10 mg at 07/11/12 1007   Or  . bisacodyl (DULCOLAX) suppository 10 mg  10 mg Rectal Daily Nani Skillern, PA      . budesonide-formoterol (SYMBICORT) 160-4.5 MCG/ACT inhaler 2 puff  2 puff Inhalation BID Ivin Poot, MD   2 puff at 07/11/12 2002  . docusate sodium (COLACE) capsule 200 mg  200 mg Oral  Daily Nani Skillern, PA   200 mg at 07/11/12 1004  . ferrous Q000111Q C-folic acid (TRINSICON / FOLTRIN) capsule 1 capsule  1 capsule Oral TID PC Ivin Poot, MD   1 capsule at 07/11/12 2012  . furosemide (LASIX) tablet 40 mg  40 mg Oral Daily Ivin Poot, MD   40 mg at 07/11/12 1004  . insulin aspart (novoLOG) injection 0-24 Units  0-24 Units Subcutaneous TID AC & HS Ivin Poot, MD   2 Units at 07/11/12 1205  . lactulose (CHRONULAC) 10 GM/15ML solution 30 g  30 g Oral Daily PRN Ellwood Handler, PA      . levalbuterol (XOPENEX) nebulizer solution 1.25 mg  1.25 mg Nebulization TID Ivin Poot, MD   1.25 mg at 07/11/12 2002  . magnesium hydroxide (MILK OF MAGNESIA) suspension 30 mL  30 mL Oral Daily PRN Ivin Poot, MD      . metoprolol (LOPRESSOR) injection 2.5-5 mg  2.5-5 mg Intravenous Q2H PRN Nani Skillern, PA      . ondansetron [facility]) injection 4 mg  4 mg Intravenous Q6H PRN Nani Skillern, PA   4 mg at 07/10/12 0917  . pantoprazole (PROTONIX) EC tablet 40 mg  40 mg Oral Daily Nani Skillern, [place]  40 mg at 07/11/12 1004  . polyvinyl alcohol (LIQUIFILM TEARS) 1.4 % ophthalmic solution 1 drop  1 drop Both Eyes Daily PRN Ivin Poot, MD      . potassium chloride SA (K-DUR,KLOR-CON) CR tablet 20 mEq  20 mEq Oral Daily Ivin Poot, MD   20 mEq at 07/11/12 1004  . promethazine (PHENERGAN) injection 12.5 mg  12.5 mg Intravenous Q6H PRN Ivin Poot, MD      . sodium chloride 0.9 % injection 3 mL  3 mL Intravenous Q12H Nani Skillern, PA   3 mL at 07/11/12 2200  . sodium chloride 0.9 % injection 3 mL  3 mL Intravenous PRN Nani Skillern, PA   3 mL at 07/09/12 0943  . sodium chloride 0.9 % injection 3 mL  3 mL Intravenous Q12H Ivin Poot, MD   3 mL at 07/11/12 2242  . sodium chloride 0.9 % injection 3 mL  3 mL Intravenous PRN Ivin Poot, MD      . SUMAtriptan [facility]) tablet 50 mg  50 mg Oral BID PRN Ivin Poot, MD      . traMADol Veatrice Bourbon) tablet 50 mg  50 mg Oral Q6H PRN Ivin Poot, MD   50 mg at 07/11/12 2254  . [DISCONTINUED] amiodarone (PACERONE) tablet 200 mg  200 mg Oral BID Ivin Poot, MD   200 mg at 07/10/12 2132  . [DISCONTINUED] amiodarone (PACERONE) tablet 200 mg  200 mg Oral BID [name] Barrett, PA   200 mg at 07/10/12 0926  . [DISCONTINUED] insulin glargine (LANTUS) injection 12 Units  12 Units Subcutaneous Daily Ivin Poot, MD   12 Units at 07/10/12 1034    PE: General appearance: alert, cooperative and no distress Lungs: clear to auscultation bilaterally Heart: reg rhythm,  Rate elevated.  No MM Extremities: No LEE Pulses: 2+ and symmetric Skin: Large confluent area of errythema with descrete 21mm leasions on buttocks and flanks.  Confluent errythema darker on the edges in the right forearm.   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   Plan: Maintaining Aflutter with a rate of 120-130.  Asymptomatic.  BP Stable.  SP Amiodarone bolus yesterday x1. ? Adding diltiazem for rate control.    PO amio at 400mg  BID.  Hgb now 10.1 after one unit PRBCs.  Will add nystatin cream for flank and buttocks.  ? medication reaction in the right forearm.     LOS: 5 days    [name] [name],  [name] [name] 07/12/2012 8:38 AM

## 2012-07-13 LAB — GLUCOSE, CAPILLARY
Glucose-Capillary: 97 mg/dL (ref 70–99)
Glucose-Capillary: 116 mg/dL — ABNORMAL HIGH (ref 70–99)
Glucose-Capillary: 109 mg/dL — ABNORMAL HIGH (ref 70–99)
Glucose-Capillary: 116 mg/dL — ABNORMAL HIGH (ref 70–99)

## 2012-07-13 LAB — CBC
HCT: 29.3 % — ABNORMAL LOW (ref 36.0–46.0)
Hemoglobin: 9.9 g/dL — ABNORMAL LOW (ref 12.0–15.0)
MCH: 30.8 pg (ref 26.0–34.0)
MCHC: 33.8 g/dL (ref 30.0–36.0)
MCV: 91.3 fL (ref 78.0–100.0)
Platelets: 161 10*3/uL (ref 150–400)
RBC: 3.21 MIL/uL — ABNORMAL LOW (ref 3.87–5.11)
RDW: 15.2 % (ref 11.5–15.5)
WBC: 5.9 10*3/uL (ref 4.0–10.5)

## 2012-07-13 LAB — BASIC METABOLIC PANEL
BUN: 10 mg/dL (ref 6–23)
CO2: 33 mEq/L — ABNORMAL HIGH (ref 19–32)
Calcium: 9.4 mg/dL (ref 8.4–10.5)
Chloride: 94 mEq/L — ABNORMAL LOW (ref 96–112)
Creatinine, Ser: 1.11 mg/dL — ABNORMAL HIGH (ref 0.50–1.10)
GFR calc Af Amer: 56 mL/min — ABNORMAL LOW (ref >90)
GFR calc non Af Amer: 48 mL/min — ABNORMAL LOW (ref >90)
Glucose, Bld: 104 mg/dL — ABNORMAL HIGH (ref 70–99)
Potassium: 4 mEq/L (ref 3.5–5.1)
Sodium: 134 mEq/L — ABNORMAL LOW (ref 135–145)

## 2012-07-13 LAB — MAGNESIUM: Magnesium: 1.7 mg/dL (ref 1.5–2.5)

## 2012-07-13 MED ORDER — ENOXAPARIN SODIUM 40 MG/0.4ML ~~LOC~~ SOLN
40.0000 mg | Freq: Once | SUBCUTANEOUS | Status: AC
  Administered 2012-07-13: 40 mg via SUBCUTANEOUS
  Filled 2012-07-13: qty 0.4

## 2012-07-13 MED ORDER — LEVALBUTEROL HCL 1.25 MG/0.5ML IN NEBU
1.2500 mg | INHALATION_SOLUTION | Freq: Two times a day (BID) | RESPIRATORY_TRACT | Status: DC
  Administered 2012-07-13 – 2012-07-19 (×12): 1.25 mg via RESPIRATORY_TRACT
  Filled 2012-07-13 (×15): qty 0.5

## 2012-07-13 MED ORDER — SODIUM CHLORIDE 0.9 % IV SOLN
INTRAVENOUS | Status: DC
  Administered 2012-07-13: 50 mL/h via INTRAVENOUS
  Administered 2012-07-14: 10:00:00 via INTRAVENOUS

## 2012-07-13 MED ORDER — METOPROLOL TARTRATE 1 MG/ML IV SOLN
2.5000 mg | INTRAVENOUS | Status: DC | PRN
  Administered 2012-07-13 (×2): 2.5 mg via INTRAVENOUS
  Filled 2012-07-13: qty 5

## 2012-07-13 MED ORDER — ENOXAPARIN SODIUM 40 MG/0.4ML ~~LOC~~ SOLN
40.0000 mg | Freq: Two times a day (BID) | SUBCUTANEOUS | Status: DC
  Administered 2012-07-13 – 2012-07-14 (×2): 40 mg via SUBCUTANEOUS
  Filled 2012-07-13 (×3): qty 0.4

## 2012-07-13 MED ORDER — METOPROLOL TARTRATE 12.5 MG HALF TABLET
12.5000 mg | ORAL_TABLET | Freq: Four times a day (QID) | ORAL | Status: DC
  Administered 2012-07-14 – 2012-07-15 (×5): 12.5 mg via ORAL
  Filled 2012-07-13 (×12): qty 1

## 2012-07-13 MED ORDER — MAGNESIUM OXIDE 400 (241.3 MG) MG PO TABS
200.0000 mg | ORAL_TABLET | Freq: Once | ORAL | Status: AC
  Administered 2012-07-13: 200 mg via ORAL
  Filled 2012-07-13: qty 0.5

## 2012-07-13 NOTE — Progress Notes (Signed)
Pt ambulated 167ft with walker and O2 2L. Denied SOB or CP. HR stayed in 130s. Pt came back to recliner and call light in reach.

## 2012-07-13 NOTE — Progress Notes (Signed)
07/13/2012 1025 Nursing note Dr. Claiborne Billings at bedside, verbal orders to give 2.5 mg IV Metoprolol to patient. Orders enacted. HR still 130. Verbal order received to repeat 2.5mg  IV Metoprolol by Dr. Claiborne Billings at bedside. Bp 109/76 and 116/70. Will continue to closely monitor patient.  Dyland Panuco, Arville Lime

## 2012-07-13 NOTE — Progress Notes (Signed)
07/13/2012 12:20 PM Nursing note Bp rechecked after IV lopressor given 83/50 manually. HR 115. Pt. Asymptomatic, states she feels fine. Pt. Advised not to get up without assistance due to low BP. Po lopressor held per parameters. Dr. Claiborne Billings paged and made aware. No new orders at this time.  Will continue to closely monitor patient.  Stryder Poitra, Arville Lime

## 2012-07-13 NOTE — Progress Notes (Signed)
PT Cancellation Note  Patient Details Name: [name] [name] MRN: [id number] DOB: [birth year]   Cancelled Treatment:    Reason Eval/Treat Not Completed: Medical issues which prohibited therapy: HR 120-125 at rest; BP 73/32 (MAP 50). Noted pt's cardioversion rescheduled.   [name] [name] 07/13/2012, 12:13 PM Pager [phone]

## 2012-07-13 NOTE — Progress Notes (Signed)
0900  Pt for cardioversion today. RN asked that we hold ambulation this am. Will continue to follow.[name] Spittler DunlapRN

## 2012-07-13 NOTE — Progress Notes (Signed)
07/13/2012 2:12 PM Nursing note Pt. BP continues to run low. Standing BP 76/42, Sitting 86/55, Lying 103/64. Pt. Remains asymptomatic. Dr. Claiborne Billings paged and made aware. Orders received and enacted. Will continue to closely monitor patient.

## 2012-07-13 NOTE — Progress Notes (Signed)
[facility]  Subjective: No comlaints  Objective: Vital signs in last 24 hours: Temp:  [98.2 F (36.8 C)-98.6 F (37 C)] 98.6 F (37 C) (12/09 0644) Pulse Rate:  [131-139] 139  (12/09 0844) Resp:  [18-20] 20  (12/09 0644) BP: (93-116)/(51-79) 107/67 mmHg (12/09 0844) SpO2:  [92 %-95 %] 95 % (12/09 0644) Weight:  [62.782 kg (138 lb 6.6 oz)] 62.782 kg (138 lb 6.6 oz) (12/09 0644) Last BM Date: 07/11/12  Intake/Output from previous day: 12/08 0701 - 12/09 0700 In: 240 [P.O.:240] Out: 700 [Urine:700] Intake/Output this shift:    Medications Current Facility-Administered Medications  Medication Dose Route Frequency Provider Last Rate Last Dose  . 0.9 %  sodium chloride infusion  250 mL Intravenous PRN Ivin Poot, MD      . [EXPIRED] acetaminophen (TYLENOL) tablet 1,000 mg  1,000 mg Oral Q6H Nani Skillern, PA   1,000 mg at 07/09/12 1759   Or  . [EXPIRED] acetaminophen (TYLENOL) solution 975 mg  975 mg Per Tube Q6H Donielle Liston Alba, PA      . acetaminophen (TYLENOL) tablet 650 mg  650 mg Oral Q6H PRN Ivin Poot, MD      . amiodarone (PACERONE) tablet 400 mg  400 mg Oral BID Coolidge Breeze, PA   400 mg at 07/13/12 1004  . beta carotene w/minerals (OCUVITE) tablet 1 tablet  1 tablet Oral Daily Nani Skillern, PA   1 tablet at 07/13/12 1005  . bisacodyl (DULCOLAX) EC tablet 10 mg  10 mg Oral Daily Nani Skillern, PA   10 mg at 07/11/12 1007   Or  . bisacodyl (DULCOLAX) suppository 10 mg  10 mg Rectal Daily Nani Skillern, PA      . budesonide-formoterol (SYMBICORT) 160-4.5 MCG/ACT inhaler 2 puff  2 puff Inhalation BID Ivin Poot, MD   2 puff at 07/13/12 0749  . docusate sodium (COLACE) capsule 200 mg  200 mg Oral Daily Nani Skillern, PA   200 mg at 07/13/12 1005  . ferrous Q000111Q C-folic acid (TRINSICON / FOLTRIN) capsule 1 capsule  1 capsule Oral TID PC Ivin Poot, MD   1 capsule at  07/13/12 1004  . furosemide (LASIX) tablet 40 mg  40 mg Oral Daily Ivin Poot, MD   40 mg at 07/13/12 1005  . hydrocortisone cream 1 %   Topical TID Coolidge Breeze, PA      . insulin aspart (novoLOG) injection 0-24 Units  0-24 Units Subcutaneous TID AC & HS Ivin Poot, MD   2 Units at 07/11/12 1205  . lactulose (CHRONULAC) 10 GM/15ML solution 30 g  30 g Oral Daily PRN [name] Barrett, PA      . levalbuterol (XOPENEX) nebulizer solution 1.25 mg  1.25 mg Nebulization TID Ivin Poot, MD   1.25 mg at 07/13/12 0749  . magnesium hydroxide (MILK OF MAGNESIA) suspension 30 mL  30 mL Oral Daily PRN Ivin Poot, MD      . metoprolol (LOPRESSOR) injection 2.5-5 mg  2.5-5 mg Intravenous Q2H PRN Nani Skillern, PA      . nystatin cream (MYCOSTATIN)   Topical BID Tarri Fuller, PA      . ondansetron [facility]) injection 4 mg  4 mg Intravenous Q6H PRN Nani Skillern, PA   4 mg at 07/10/12 0917  . pantoprazole (PROTONIX) EC tablet 40 mg  40 mg Oral Daily Nani Skillern, [place]  40 mg at 07/12/12 1000  . polyvinyl alcohol (LIQUIFILM TEARS) 1.4 % ophthalmic solution 1 drop  1 drop Both Eyes Daily PRN Ivin Poot, MD      . potassium chloride SA (K-DUR,KLOR-CON) CR tablet 20 mEq  20 mEq Oral Daily Ivin Poot, MD   20 mEq at 07/13/12 1004  . promethazine (PHENERGAN) injection 12.5 mg  12.5 mg Intravenous Q6H PRN Ivin Poot, MD      . sodium chloride 0.9 % injection 3 mL  3 mL Intravenous Q12H Nani Skillern, PA   3 mL at 07/13/12 1000  . sodium chloride 0.9 % injection 3 mL  3 mL Intravenous PRN Nani Skillern, PA   3 mL at 07/09/12 0943  . sodium chloride 0.9 % injection 3 mL  3 mL Intravenous Q12H Ivin Poot, MD   3 mL at 07/12/12 2143  . sodium chloride 0.9 % injection 3 mL  3 mL Intravenous PRN Ivin Poot, MD      . SUMAtriptan [facility]) tablet 50 mg  50 mg Oral BID PRN Ivin Poot, MD      . traMADol Veatrice Bourbon) tablet 50 mg  50 mg Oral Q6H PRN  Ivin Poot, MD   50 mg at 07/13/12 1007    PE: General appearance: alert, cooperative and no distress Lungs: clear to auscultation bilaterally Heart: reg rhythm.  Rate elevated Extremities: No LEE Pulses: 2+ and symmetric Skin: Skin color, texture, turgor normal. 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 Asymptomatic. BP Stable. SP Amiodarone bolus x1 Saturday.  Tried A-pacing out yesterday.   TEE/DCCV on Wednesday at 1030hrs.  NPO order placed.  Giving IV lopressor 2.5mg  now q39min.  Lovenox per CTS.  Will need to start long term anticoagulation     LOS: 6 days    HAGER, BRYAN 07/13/2012 10:23 AM   Patient seen and examined. Agree with assessment and plan. TEE cardioversion was unable to be scheduled today. She continues to be in A flutter at 130 - 135.  I have given IV lopressor 2.5 mg every 3-5 minutes for 4 doses and total iv dose of 10 mg. Rate now 118. Also attempt at atrial pacing out of flutter unsuccessful.  Will start oral lopressor at 12.5 mg every 6 hours.  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 Temp pacer is set at 60 for ventricular rate. To start lovenox.   Troy Sine, MD, [facility] 07/13/2012 11:10 AM

## 2012-07-13 NOTE — Progress Notes (Addendum)
                    [address]            [place] [address]          [phone]     6 Days Post-Op Procedure(s) (LRB): REPLACEMENT ASCENDING AORTA (N/A) REDO STERNOTOMY (N/A)  Subjective: Feels a little better today, still weak.  Rash better.   Objective: Vital signs in last 24 hours: Patient Vitals for the past 24 hrs:  BP Temp Temp src Pulse Resp SpO2 Weight  07/13/12 0644 104/64 mmHg 98.6 F (37 C) Oral 131  20  95 % 138 lb 6.6 oz (62.782 kg)  07/12/12 2116 102/57 mmHg 98.2 F (36.8 C) Oral 137  18  92 % -  07/12/12 2018 - - - - - 94 % -  07/12/12 1434 116/79 mmHg 98.5 F (36.9 C) - 131  18  94 % -  07/12/12 1036 93/51 mmHg - - 134  - - -  07/12/12 0857 - - - - - 96 % -   Current Weight  07/13/12 138 lb 6.6 oz (62.782 kg)     Intake/Output from previous day: 12/08 0701 - 12/09 0700 In: 240 [P.O.:240] Out: 700 [Urine:700]    PHYSICAL EXAM:  Heart: RRR, tachy, a flutter on tele Lungs: Clear Wound: Clean and dry Extremities: Mild LE edema.  Rash on R forearm and back less red.    Lab Results: CBC: Basename 07/13/12 0615 07/12/12 0642  WBC 5.9 5.9  HGB 9.9* 10.1*  HCT 29.3* 29.3*  PLT 161 135*   BMET:  Basename 07/13/12 0615 07/12/12 0642  NA 134* 132*  K 4.0 3.8  CL 94* 95*  CO2 33* 29  GLUCOSE 104* 109*  BUN 10 11  CREATININE 1.11* 1.16*  CALCIUM 9.4 9.1    PT/INR: No results found for this basename: LABPROT,INR in the last 72 hours    Assessment/Plan: S/P Procedure(s) (LRB): REPLACEMENT ASCENDING AORTA (N/A) REDO STERNOTOMY (N/A) CV- remains in A flutter, rates 120-130s.  Will proceed with TEE cardioversion this am per Dr. Debara Pickett.  Continue present meds for now. Rash- continue topical creams. Expected postoperative blood loss anemia- improved after tx.    LOS: 6 days    [name],[name] 07/13/2012    Chart reviewed, patient examined, agree with above. According to patient the cardioversion has been cancelled  today due to scheduling problems. She is quite anxious to get this done.

## 2012-07-13 NOTE — Progress Notes (Addendum)
CARDIAC REHAB PHASE I   PRE:  Rate/Rhythm: 120 Afib  BP:  Supine: 93/64  Sitting: 93/59  Standing: 90/52   SaO2:   MODE:  Ambulation: 350 ft   POST:  Rate/Rhythem: 120 Afib  BP:  Supine:   Sitting: 103/67  Standing:     SaO2: 95 2L Assisted X 2 used walker and O2 2L to ambulate. Gait steady VS stable. Pt states that it felt good to walk. HR unchanged with walking. Pt to recliner after walk with call light in reach. QP:[id number]  Deon Pilling

## 2012-07-14 LAB — GLUCOSE, CAPILLARY
Glucose-Capillary: 105 mg/dL — ABNORMAL HIGH (ref 70–99)
Glucose-Capillary: 107 mg/dL — ABNORMAL HIGH (ref 70–99)
Glucose-Capillary: 120 mg/dL — ABNORMAL HIGH (ref 70–99)
Glucose-Capillary: 101 mg/dL — ABNORMAL HIGH (ref 70–99)

## 2012-07-14 MED ORDER — ENOXAPARIN SODIUM 60 MG/0.6ML ~~LOC~~ SOLN
60.0000 mg | Freq: Two times a day (BID) | SUBCUTANEOUS | Status: DC
  Administered 2012-07-14 – 2012-07-16 (×4): 60 mg via SUBCUTANEOUS
  Filled 2012-07-14 (×7): qty 0.6

## 2012-07-14 MED ORDER — COUMADIN BOOK
Freq: Once | Status: AC
  Administered 2012-07-14: 14:00:00
  Filled 2012-07-14: qty 1

## 2012-07-14 MED ORDER — DIGOXIN 125 MCG PO TABS
0.1250 mg | ORAL_TABLET | Freq: Every day | ORAL | Status: DC
  Administered 2012-07-14: 0.125 mg via ORAL
  Filled 2012-07-14 (×2): qty 1

## 2012-07-14 MED ORDER — WARFARIN - PHARMACIST DOSING INPATIENT: Freq: Every day | Status: DC

## 2012-07-14 MED ORDER — WARFARIN VIDEO
Freq: Once | Status: AC
  Administered 2012-07-14: 20:00:00

## 2012-07-14 MED ORDER — WARFARIN SODIUM 5 MG PO TABS
5.0000 mg | ORAL_TABLET | Freq: Once | ORAL | Status: AC
  Administered 2012-07-14: 5 mg via ORAL
  Filled 2012-07-14: qty 1

## 2012-07-14 MED FILL — Sodium Chloride IV Soln 0.9%: INTRAVENOUS | Qty: 1000 | Status: AC

## 2012-07-14 MED FILL — Sodium Chloride Irrigation Soln 0.9%: Qty: 2000 | Status: AC

## 2012-07-14 MED FILL — Heparin Sodium (Porcine) Inj 1000 Unit/ML: INTRAMUSCULAR | Qty: 30 | Status: AC

## 2012-07-14 MED FILL — Electrolyte-R (PH 7.4) Solution: INTRAVENOUS | Qty: 6000 | Status: AC

## 2012-07-14 MED FILL — Lidocaine HCl IV Inj 20 MG/ML: INTRAVENOUS | Qty: 5 | Status: AC

## 2012-07-14 MED FILL — Sodium Bicarbonate IV Soln 8.4%: INTRAVENOUS | Qty: 50 | Status: AC

## 2012-07-14 MED FILL — Mannitol IV Soln 20%: INTRAVENOUS | Qty: 500 | Status: AC

## 2012-07-14 MED FILL — Albumin, Human Inj 5%: INTRAVENOUS | Qty: 250 | Status: AC

## 2012-07-14 MED FILL — Heparin Sodium (Porcine) Inj 1000 Unit/ML: INTRAMUSCULAR | Qty: 10 | Status: AC

## 2012-07-14 NOTE — Progress Notes (Signed)
Pt ambulated 300 ft with rolling walker. Denied SOB or CP. Came back to bed. Call bell within reach. Will continue to follow.

## 2012-07-14 NOTE — Progress Notes (Addendum)
                    [address]            [place] [address]          [phone]     7 Days Post-Op Procedure(s) (LRB): REPLACEMENT ASCENDING AORTA (N/A) REDO STERNOTOMY (N/A)  Subjective: Rested better last night.  No new complaints.   Objective: Vital signs in last 24 hours: Patient Vitals for the past 24 hrs:  BP Temp Temp src Pulse Resp SpO2 Weight  07/14/12 0814 - - - 124  21  96 % -  07/14/12 0558 95/58 mmHg - - 121  - - 138 lb 11.2 oz (62.914 kg)  07/14/12 0435 104/55 mmHg 98.8 F (37.1 C) Oral 124  18  95 % -  07/13/12 2326 88/56 mmHg - - 127  - - -  07/13/12 2015 - - - - - 91 % -  07/13/12 2014 - - - - - 91 % -  07/13/12 1950 100/54 mmHg 98.8 F (37.1 C) Oral 123  20  96 % -  07/13/12 1751 90/52 mmHg - - - - - -  07/13/12 1619 105/69 mmHg - - 121  - - -  07/13/12 1400 103/64 mmHg - - 118  - - -  07/13/12 1335 98/59 mmHg - - 115  - - -  07/13/12 1302 87/55 mmHg 98.6 F (37 C) Oral 120  20  94 % -  07/13/12 1235 93/58 mmHg - - 120  - - -  07/13/12 1200 83/50 mmHg - - 115  - - -  07/13/12 1126 116/70 mmHg - - 115  - - -  07/13/12 1059 116/75 mmHg - - - - - -  07/13/12 1055 103/65 mmHg - - - - - -  07/13/12 0844 107/67 mmHg - - 139  - - -   Current Weight  07/14/12 138 lb 11.2 oz (62.914 kg)     Intake/Output from previous day: 12/09 0701 - 12/10 0700 In: 794.2 [I.V.:794.2] Out: 1300 [Urine:1300]    PHYSICAL EXAM:  Heart: RRR, tachy, a flutter on tele Lungs: Clear Wound: Clean and dry Extremities: No significant LE edema    Lab Results: CBC: Basename 07/13/12 0615 07/12/12 0642  WBC 5.9 5.9  HGB 9.9* 10.1*  HCT 29.3* 29.3*  PLT 161 135*   BMET:  Basename 07/13/12 0615 07/12/12 0642  NA 134* 132*  K 4.0 3.8  CL 94* 95*  CO2 33* 29  GLUCOSE 104* 109*  BUN 10 11  CREATININE 1.11* 1.16*  CALCIUM 9.4 9.1    PT/INR: No results found for this basename: LABPROT,INR in the last 72 hours    Assessment/Plan: S/P  Procedure(s) (LRB): REPLACEMENT ASCENDING AORTA (N/A) REDO STERNOTOMY (N/A)  CV-  A flutter, rates 110-120s. For TEE cardioversion in am per cardiology.  Hypotensive, Lopressor being held, on IVFs.  We have started her on Lovenox- does she need further anticoagulation? I have discussed with Cecilie Kicks, NP.   Will defer to cardiology.  Rash- improved, continue topical creams.   Expected postoperative blood loss anemia- stable. Continue to monitor.   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

## 2012-07-14 NOTE — Progress Notes (Signed)
CARDIAC REHAB PHASE I   PRE:  Rate/Rhythm: 125 afib/flutter  BP:  Supine:  Sitting: 84/45, 100/62  Standing:    SaO2: 93-95% 2L  MODE:  Ambulation: 350 ft   POST:  Rate/Rhythem: 122 afib/flutter  BP:  Supine:   Sitting: 110/70  Standing:    SaO2: 95%2L 1130-1210 Pt walked to bathroom and then walked 350 ft on 2L with rolling walker with steady gait. Tolerated well. Tired by end of walk. Could not increase distance. To recliner after walk. Took BP manually and it was higher than dinamapp. No c/o dizziness. Set up lunch.  Jeani Sow

## 2012-07-14 NOTE — Progress Notes (Signed)
7 Days Post-Op Procedure(s) (LRB):  REPLACEMENT ASCENDING AORTA (N/A)  REDO STERNOTOMY   Subjective: Complains  Of SOB with tight feeling, beginning Neb.  HR--120's not aware of increased HR.   Objective: Vital signs in last 24 hours: Temp:  [98.6 F (37 C)-98.8 F (37.1 C)] 98.8 F (37.1 C) (12/10 0435) Pulse Rate:  [115-139] 121  (12/10 0558) Resp:  [18-20] 18  (12/10 0435) BP: (83-116)/(50-75) 95/58 mmHg (12/10 0558) SpO2:  [91 %-96 %] 95 % (12/10 0435) Weight:  [62.914 kg (138 lb 11.2 oz)] 62.914 kg (138 lb 11.2 oz) (12/10 0558) Weight change: 0.132 kg (4.6 oz) Last BM Date: 07/11/12 Intake/Output from previous day: -505 12/09 0701 - 12/10 0700 In: 794.2 [I.V.:794.2] Out: 1300 [Urine:1300] Intake/Output this shift:    PE: General:alert and oriented, pleasant affect.   Heart:S1S2 irreg irreg Lungs:decreased in the bases, no wheezes Abd:+ BS, soft, non tender Ext:no edema   Lab Results:  [facility] 07/13/12 0615 07/12/12 0642  WBC 5.9 5.9  HGB 9.9* 10.1*  HCT 29.3* 29.3*  PLT 161 135*   BMET  Basename 07/13/12 0615 07/12/12 0642  NA 134* 132*  K 4.0 3.8  CL 94* 95*  CO2 33* 29  GLUCOSE 104* 109*  BUN 10 11  CREATININE 1.11* 1.16*  CALCIUM 9.4 9.1   No results found for this basename: TROPONINI:2,CK,MB:2 in the last 72 hours  No results found for this basename: CHOL, HDL, LDLCALC, LDLDIRECT, TRIG, CHOLHDL   Lab Results  Component Value Date   HGBA1C 5.7* 07/06/2012     No results found for this basename: TSH      EKG: No recent ECG; Aflutter with RVR on Tele.  Studies/Results: No results found.  Medications: I have reviewed the patient's current medications.    [name] amiodarone  400 mg Oral BID  . beta carotene w/minerals  1 tablet Oral Daily  . bisacodyl  10 mg Oral Daily   Or  . bisacodyl  10 mg Rectal Daily  . budesonide-formoterol  2 puff Inhalation BID  . docusate sodium  200 mg Oral Daily  . [COMPLETED] enoxaparin (LOVENOX) injection  40  mg Subcutaneous Once  . enoxaparin (LOVENOX) injection  40 mg Subcutaneous Q12H  . ferrous Q000111Q C-folic acid  1 capsule Oral TID PC  . furosemide  40 mg Oral Daily  . hydrocortisone cream   Topical TID  . insulin aspart  0-24 Units Subcutaneous TID AC & HS  . levalbuterol  1.25 mg Nebulization BID  . [COMPLETED] magnesium oxide  200 mg Oral Once  . metoprolol tartrate  12.5 mg Oral Q6H  . nystatin cream   Topical BID  . pantoprazole  40 mg Oral Daily  . potassium chloride  20 mEq Oral Daily  . sodium chloride  3 mL Intravenous Q12H  . sodium chloride  3 mL Intravenous Q12H  . 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 Plans for TEE/DCCV in AM.  On amiodarone/lopressor.  Lovenox VTE prophylaxis has been added.  Begin po coumadin ??   LOS: 7 days   INGOLD,LAURA R 07/14/2012, 8:11 AM  I have seen and evaluated the patient this morning along with Cecilie Kicks, NP.  I agree with her findings, examination as well as impression recommendations.  Mildly hypotensive in Aflutter/Afib RVR -- plan is TEE/DCCV in Am.  BB held due to hypotension, Amiodarone Oral load initiated.   I have changed Lovenox to Therapeutic dosing & will initiate warfarin today.  Will defer to Dr. Debara Pickett re: continue Warfarin vs. Convert to NOAC agent => had not been discussed.  Leonie Man, M.D., M.S. [facility] [address] [place], [place]  [address]  [phone] Pager # [phone] 07/14/2012 12:03 PM

## 2012-07-14 NOTE — Progress Notes (Signed)
Physical Therapy Treatment Patient Details Name: [name] [name] MRN: [id number] DOB: Mar 23, 1941 Today's Date: 07/14/2012 Time: 0911-0929 PT Time Calculation (min): 18 min  PT Assessment / Plan / Recommendation Comments on Treatment Session  Moving great, plans for cardioversion tomorrow.     Follow Up Recommendations  Home health PT     Does the patient have the potential to tolerate intense rehabilitation     Barriers to Discharge        Equipment Recommendations  Rolling walker with 5" wheels (pending pt's progress without RW)    Recommendations for Other Services    Frequency Min 3X/week   Plan Discharge plan remains appropriate;Frequency remains appropriate    Precautions / Restrictions Precautions Precautions: Sternal Precaution Comments: external pacemaker Restrictions Weight Bearing Restrictions: No       Mobility  Bed Mobility Bed Mobility: Not assessed Transfers Transfers: Sit to Stand;Stand to Sit Sit to Stand: 5: Supervision;Without upper extremity assist Stand to Sit: 5: Supervision;Without upper extremity assist Details for Transfer Assistance: reinforced sternal precautions, pt able to stand holding onto cardiac pillow with supervision for follow through of precautions and safety more so with stand->sit Ambulation/Gait Ambulation/Gait Assistance: 5: Supervision Ambulation Distance (Feet): 300 Feet Assistive device: Rolling walker Ambulation/Gait Assistance Details: cues for tall posture and to step into RW for improved positioning Gait Pattern: Step-through pattern Gait velocity: pt reports slower than baseline      PT Goals Acute Rehab PT Goals Pt will go Sit to Stand: Independently PT Goal: Sit to Stand - Progress: Updated due to goal met Pt will Transfer Bed to Chair/Chair to Bed: Independently PT Transfer Goal: Bed to Chair/Chair to Bed - Progress: Updated due to goal met Pt will Ambulate: Independently PT Goal: Ambulate - Progress:  Updated due to goal met Additional Goals PT Goal: Additional Goal #1 - Progress: Progressing toward goal  Visit Information  Last PT Received On: 07/14/12 Assistance Needed: +1    Subjective Data  Subjective: I think I feel better today. Patient Stated Goal: home   Cognition  Overall Cognitive Status: Appears within functional limits for tasks assessed/performed Arousal/Alertness: Awake/alert Orientation Level: Appears intact for tasks assessed Behavior During Session: [facility] for tasks performed    Balance     End of Session PT - End of Session Equipment Utilized During Treatment: Gait belt Activity Tolerance: Patient tolerated treatment well Patient left: in chair;with call bell/phone within reach Nurse Communication: Mobility status   GP     [place] 07/14/2012, 9:38 AM

## 2012-07-14 NOTE — Progress Notes (Addendum)
ANTICOAGULATION CONSULT NOTE - Initial Consult  Pharmacy Consult for coumadin/lovenox Indication: atrial fibrillation  Allergies  Allergen Reactions  . Other Nausea And Vomiting    Headache also  Also from artificial sweeetners  . Codeine Nausea And Vomiting    Patient Measurements: Height: 5\' 1"  (154.9 cm) Weight: 138 lb 11.2 oz (62.914 kg) IBW/kg (Calculated) : 47.8    Vital Signs: Temp: 98.8 F (37.1 C) (12/10 0435) Temp src: Oral (12/10 0435) BP: 95/58 mmHg (12/10 0558) Pulse Rate: 122  (12/10 0920)  Labs:  Basename 07/13/12 0615 07/12/12 0642  HGB 9.9* 10.1*  HCT 29.3* 29.3*  PLT 161 135*  APTT -- --  LABPROT -- --  INR -- --  HEPARINUNFRC -- --  CREATININE 1.11* 1.16*  CKTOTAL -- --  CKMB -- --  TROPONINI -- --    Estimated Creatinine Clearance: 38.3 ml/min (by C-G formula based on Cr of 1.11).   Medical History: Past Medical History  Diagnosis Date  . 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 H/O aortic valve disorder 10/04/2002    Dr. Tharon Aquas Trigt  . Thoracic ascending aortic aneurysm     CTA CHEST 05/11/12  . History of migraine headaches   . Headache   . Migraines   . Heart murmur   . GERD (gastroesophageal reflux disease)   . Arthritis     Medications:  Prescriptions prior to admission  Medication Sig Dispense Refill  . aspirin EC 81 MG tablet Take 81 mg by mouth daily.      . Calcium-Magnesium-Vitamin D (CALCIUM 500 PO) Take 500 mg by mouth 2 (two) times daily.      . Cholecalciferol (VITAMIN D) 2000 UNITS CAPS Take 2,000 Units by mouth 2 (two) times daily.      [name] CINNAMON PO Take 2,000 mg by mouth daily.       . fish oil-omega-3 fatty acids 1000 MG capsule Take 2 g by mouth daily.       [name] lisinopril (PRINIVIL,ZESTRIL) 5 MG tablet Take 5 mg by mouth daily.      . Magnesium (MAGNACAPS PO) Take 1 tablet by mouth daily.      . Multiple Vitamins-Minerals (OCUVITE PO) Take 1 tablet by mouth daily.       . naproxen sodium (ANAPROX)  220 MG tablet Take 220 mg by mouth 2 (two) times daily with a meal. For pain      . pantoprazole (PROTONIX) 40 MG tablet Take 40 mg by mouth daily.      Vladimir Faster Glycol-Propyl Glycol (SYSTANE OP) Place 1 drop into both eyes daily.      . propranolol (INDERAL) 20 MG tablet Take 20 mg by mouth 2 (two) times daily.       . ranitidine (ZANTAC) 75 MG tablet Take 75 mg by mouth daily as needed. indigestion      . SUMAtriptan (IMITREX) 50 MG tablet Take 50 mg by mouth every 2 (two) hours as needed. For migraines       Scheduled:    . amiodarone  400 mg Oral BID  . beta carotene w/minerals  1 tablet Oral Daily  . bisacodyl  10 mg Oral Daily   Or  . bisacodyl  10 mg Rectal Daily  . budesonide-formoterol  2 puff Inhalation BID  . digoxin  0.125 mg Oral Daily  . docusate sodium  200 mg Oral Daily  . ferrous Q000111Q C-folic acid  1 capsule Oral TID PC  .  furosemide  40 mg Oral Daily  . hydrocortisone cream   Topical TID  . insulin aspart  0-24 Units Subcutaneous TID AC & HS  . levalbuterol  1.25 mg Nebulization BID  . metoprolol tartrate  12.5 mg Oral Q6H  . nystatin cream   Topical BID  . pantoprazole  40 mg Oral Daily  . potassium chloride  20 mEq Oral Daily  . sodium chloride  3 mL Intravenous Q12H  . sodium chloride  3 mL Intravenous Q12H  . [DISCONTINUED] enoxaparin (LOVENOX) injection  40 mg Subcutaneous Q12H    Assessment: 71 yo female s/p re-do sternotomy, replacement ascending thoracic aorta (7 days post-op). Patient is noted with afib and to start coumadin (baseline INR=1.24). She was on lovenox 40mg  q12h and last dose was about 10:00am today. Pharmacy to dose lovenox and coumadin- patient noted for cardioversion on 12/11.   Goal of Therapy:  INR 2-3 Monitor platelets by anticoagulation protocol: Yes   Plan:  -Will change lovenox to 60mg  q12h -Coumadin 5mg  po today -Daily PT/INR -CBC every 3 days -Will begin education process  Hildred Laser, Sherian Rein D  07/14/2012 1:59 PM

## 2012-07-15 ENCOUNTER — Encounter (HOSPITAL_COMMUNITY): Payer: Self-pay | Admitting: Critical Care Medicine

## 2012-07-15 ENCOUNTER — Inpatient Hospital Stay (HOSPITAL_COMMUNITY): Payer: Medicare Other | Admitting: Critical Care Medicine

## 2012-07-15 ENCOUNTER — Encounter (HOSPITAL_COMMUNITY): Payer: Self-pay | Admitting: *Deleted

## 2012-07-15 ENCOUNTER — Inpatient Hospital Stay (HOSPITAL_COMMUNITY): Payer: Medicare Other

## 2012-07-15 ENCOUNTER — Encounter (HOSPITAL_COMMUNITY)
Admission: RE | Disposition: A | Discharge status: Home Health | Payer: Self-pay | Source: Ambulatory Visit | Attending: Cardiothoracic Surgery

## 2012-07-15 HISTORY — PX: TEE WITHOUT CARDIOVERSION: SHX5443

## 2012-07-15 HISTORY — PX: CARDIOVERSION: SHX1299

## 2012-07-15 LAB — CBC
Hemoglobin: 9.5 g/dL — ABNORMAL LOW (ref 12.0–15.0)
MCV: 93 fL (ref 78.0–100.0)
Platelets: 213 10*3/uL (ref 150–400)
RBC: 3.02 MIL/uL — ABNORMAL LOW (ref 3.87–5.11)
WBC: 5.6 10*3/uL (ref 4.0–10.5)

## 2012-07-15 LAB — BASIC METABOLIC PANEL
CO2: 30 mEq/L (ref 19–32)
Calcium: 9.3 mg/dL (ref 8.4–10.5)
Potassium: 4.4 mEq/L (ref 3.5–5.1)
Sodium: 137 mEq/L (ref 135–145)

## 2012-07-15 LAB — GLUCOSE, CAPILLARY: Glucose-Capillary: 95 mg/dL (ref 70–99)

## 2012-07-15 LAB — PROTIME-INR: INR: 1.17 (ref 0.00–1.49)

## 2012-07-15 LAB — MAGNESIUM: Magnesium: 1.8 mg/dL (ref 1.5–2.5)

## 2012-07-15 SURGERY — ECHOCARDIOGRAM, TRANSESOPHAGEAL
Anesthesia: General

## 2012-07-15 MED ORDER — LIDOCAINE VISCOUS 2 % MT SOLN
OROMUCOSAL | Status: AC
  Filled 2012-07-15: qty 15

## 2012-07-15 MED ORDER — PROPOFOL 10 MG/ML IV BOLUS
INTRAVENOUS | Status: DC | PRN
  Administered 2012-07-15: 40 mg via INTRAVENOUS

## 2012-07-15 MED ORDER — FENTANYL CITRATE 0.05 MG/ML IJ SOLN
INTRAMUSCULAR | Status: AC
  Filled 2012-07-15: qty 2

## 2012-07-15 MED ORDER — MIDAZOLAM HCL 5 MG/ML IJ SOLN
INTRAMUSCULAR | Status: AC
  Filled 2012-07-15: qty 2

## 2012-07-15 MED ORDER — BUTAMBEN-TETRACAINE-BENZOCAINE 2-2-14 % EX AERO
INHALATION_SPRAY | CUTANEOUS | Status: DC | PRN
  Administered 2012-07-15: 2 via TOPICAL

## 2012-07-15 MED ORDER — LIDOCAINE VISCOUS 2 % MT SOLN
OROMUCOSAL | Status: DC | PRN
  Administered 2012-07-15: 20 mL via OROMUCOSAL

## 2012-07-15 MED ORDER — FENTANYL CITRATE 0.05 MG/ML IJ SOLN
INTRAMUSCULAR | Status: DC | PRN
  Administered 2012-07-15 (×2): 25 ug via INTRAVENOUS

## 2012-07-15 MED ORDER — MIDAZOLAM HCL 10 MG/2ML IJ SOLN
INTRAMUSCULAR | Status: DC | PRN
  Administered 2012-07-15: 2 mg via INTRAVENOUS
  Administered 2012-07-15 (×3): 1 mg via INTRAVENOUS

## 2012-07-15 MED ORDER — WARFARIN SODIUM 5 MG PO TABS
5.0000 mg | ORAL_TABLET | Freq: Once | ORAL | Status: AC
  Administered 2012-07-15: 5 mg via ORAL
  Filled 2012-07-15: qty 1

## 2012-07-15 MED ORDER — SODIUM CHLORIDE 0.9 % IJ SOLN
10.0000 mL | INTRAMUSCULAR | Status: DC | PRN
  Administered 2012-07-17 – 2012-07-19 (×5): 10 mL

## 2012-07-15 NOTE — Progress Notes (Signed)
Peripherally Inserted Central Catheter/Midline Placement  The IV Nurse has discussed with the patient and/or persons authorized to consent for the patient, the purpose of this procedure and the potential benefits and risks involved with this procedure.  The benefits include less needle sticks, lab draws from the catheter and patient may be discharged home with the catheter.  Risks include, but not limited to, infection, bleeding, blood clot (thrombus formation), and puncture of an artery; nerve damage and irregular heat beat.  Alternatives to this procedure were also discussed.  PICC/Midline Placement Documentation  PICC / Midline Single Lumen 07/15/12 PICC Left Brachial (Active)       Sheralyn Boatman 07/15/2012, 9:05 AM

## 2012-07-15 NOTE — Transfer of Care (Signed)
Immediate Anesthesia Transfer of Care Note  Patient: [name] [name]  Procedure(s) Performed: Procedure(s) (LRB) with comments: TRANSESOPHAGEAL ECHOCARDIOGRAM (TEE) (N/A) CARDIOVERSION (N/A)  Patient Location: Endoscopy Unit  Anesthesia Type:General  Level of Consciousness: awake and alert   Airway & Oxygen Therapy: Patient Spontanous Breathing and Patient connected to face mask oxygen  Post-op Assessment: Report given to PACU RN and Post -op Vital signs reviewed and stable  Post vital signs: Reviewed and stable  Complications: No apparent anesthesia complications

## 2012-07-15 NOTE — Op Note (Signed)
Tee performed and cardioversion was attempted at 11:43 at 150J. Pt went asystole and temp pacer was started immediately at a rate of 80 and A output 27ma V output 46ma.

## 2012-07-15 NOTE — H&P (Signed)
[facility]          INTERVAL PROCEDURE H&P   History and Physical Interval Note:  07/15/2012 Q000111Q AM  [name] [name] has presented today for their planned procedure. The various methods of treatment have been discussed with the patient and family. After consideration of risks, benefits and other options for treatment, the patient has consented to the procedure.  The patients' outpatient history has been reviewed, patient examined, and no change in status from most recent office note within the past 30 days. I have reviewed the patients' chart and labs and will proceed as planned. Questions were answered to the patient's satisfaction.   Pixie Casino, MD, [facility] Attending Cardiologist [facility] 07/15/2012, 10:33 AM

## 2012-07-15 NOTE — Progress Notes (Addendum)
                   [address]            [place] [address]          [phone]      8 Days Post-Op Procedure(s) (LRB): REPLACEMENT ASCENDING AORTA (N/A) REDO STERNOTOMY (N/A)  Subjective: Patient with loose stools   Objective: Vital signs in last 24 hours: Temp:  [98 F (36.7 C)-99.6 F (37.6 C)] 98.1 F (36.7 C) (12/11 0615) Pulse Rate:  [118-124] 118  (12/11 0615) Cardiac Rhythm:  [-] Atrial flutter (12/10 1915) Resp:  [18-21] 18  (12/11 0615) BP: (99-118)/(61-67) 99/66 mmHg (12/11 0615) SpO2:  [88 %-98 %] 98 % (12/11 0727) Weight:  [134 lb 4.2 oz (60.9 kg)] 134 lb 4.2 oz (60.9 kg) (12/11 0615)  Pre op weight  59 kg Current Weight  07/15/12 134 lb 4.2 oz (60.9 kg)      Intake/Output from previous day: 12/10 0701 - 12/11 0700 In: 1901.7 [P.O.:680; I.V.:1221.7] Out: 1375 [Urine:1375]   Physical Exam:  Cardiovascular:IRRR IRRR Pulmonary: Clear to auscultation bilaterally; no rales, wheezes, or rhonchi. Abdomen: Soft, non tender, bowel sounds present. Extremities: Mild bilateral lower extremity edema. Wounds: Clean and dry.  No erythema or signs of infection.  Lab Results: CBC: Basename 07/15/12 0425 07/13/12 0615  WBC 5.6 5.9  HGB 9.5* 9.9*  HCT 28.1* 29.3*  PLT 213 161   BMET:  Basename 07/15/12 0425 07/13/12 0615  NA 137 134*  K 4.4 4.0  CL 98 94*  CO2 30 33*  GLUCOSE 103* 104*  BUN 14 10  CREATININE 1.14* 1.11*  CALCIUM 9.3 9.4    PT/INR:  Lab Results  Component Value Date   INR 1.17 07/15/2012   INR 1.24 07/07/2012   INR 0.95 07/06/2012   ABG:  INR: Will add last result for INR, ABG once components are confirmed Will add last 4 CBG results once components are confirmed  Assessment/Plan:  1. CV - A flutter with rate into 110's. On Amiodarone 400 bid,Lopressor 12.5 qid, digoxin 0.125 daily, and Coumadin.Has not been receiving Lopressor regularly as SBP in the 90's. Scheduled for possible TEE/Cardoversion today 2.   Pulmonary - Encourage incentive spirometer. Wean oxygen-on 2 L via [place]. 3.  Acute blood loss anemia - H and H this am 9.5 and 28.1. Continue Trinsicon 4. Pre op HGA1C 5.7. CBGs have been less than 120. Will stop accu checks, SS. Will need further surveillance as an outpatient. 5.Volume overload-continue with diuresis 6.Stop stool softeners 7.Possibly remove EPW in am. Rapid atrial pacing was attempted yesterday. She briefly converted to SR but then went back into a flutter  Kadesia Robel MPA-C 07/15/2012,7:34 AM

## 2012-07-15 NOTE — Progress Notes (Signed)
*  PRELIMINARY RESULTS* Echocardiogram Echocardiogram Transesophageal has been performed.  Leavy Cella 07/15/2012, 1:15 PM

## 2012-07-15 NOTE — CV Procedure (Signed)
[facility]  TEE/CARDIOVERSION NOTE   TRANSESOPHAGEAL ECHOCARDIOGRAM (TEE):  Indictation: Atrial Flutter  Consent:   Informed consent was obtained prior to the procedure. The risks, benefits and alternatives for the procedure were discussed and the patient comprehended these risks.  Risks include, but are not limited to, cough, sore throat, vomiting, nausea, somnolence, esophageal and stomach trauma or perforation, bleeding, low blood pressure, aspiration, pneumonia, infection, trauma to the teeth and death.    Time Out: Verified patient identification, verified procedure, site/side was marked, verified correct patient position, special equipment/implants available, medications/allergies/relevent history reviewed, required imaging and test results available. Performed  Procedure:  After a procedural time-out, the patient was given 5 mg versed and 50 mcg fentanyl for moderate sedation. The oropharynx was anesthetized 10 cc of topical 1% viscous lidocaine and 1 spray of cetacaine.  The transesophageal probe was difficult to pass due to inadequate sedation, however, it was felt that additional moderate sedation would lead to airway compromise.  Anesthesia was called and she was given propofol.  A laryngoscope was used to help open the airway and displace the tongue and the TEE probe was eventually passed into the esophagus. There were no early complications from the procedure.   Agitated microbubble saline contrast was not administered.  Complications:    Complications: None Patient did tolerate procedure well.  Findings:  1. LEFT VENTRICLE: The left ventricle is normal in structure and function without any thrombus or masses. LVEF is 55%.  2. RIGHT VENTRICLE:  The right ventricle is normal in structure and function without any thrombus or masses.    3. LEFT ATRIUM:  The left atrium is dilated without any thrombus or masses.  4. LEFT ATRIAL APPENDAGE:   The left atrial appendage is free of any thrombus or masses.  There is coarse trabeculation. There are flutter waves by pulse doppler and low velocities. Color doppler is seen filling the appendage.  5. ATRIAL SEPTUM:  The atrial septum is free of any thrombus or masses.  There is no evidence for interatrial shunting by color doppler.  6. RIGHT ATRIUM:  The right atrium is normal in size and function without any thrombus or masses.  7. MITRAL VALVE:  The mitral valve has mild prolapse and doming of the posterior leaflet. There is mild eccentric regurgitation.  There were no vegetations or stenosis.  8. AORTIC VALVE:  The aortic valve is bioprosthetic with normal leaflet motion and no significant regurgitation.  There were no vegetations or stenosis.  9. TRICUSPID VALVE:  The tricuspid valve is normal in structure and function with trace to mild regurgitation.  There were no vegetations or stenosis.  10.  PULMONIC VALVE:  The tricuspid valve is normal in structure and function without regurgitation.  There were no vegetations or stenosis.   11. AORTIC ARCH, ASCENDING AND DESCENDING AORTA:  The aorta was not well visualized but is noted to be surgically repaired.  CARDIOVERSION:     Time Out: Verified patient identification, verified procedure, site/side was marked, verified correct patient position, special equipment/implants available, medications/allergies/relevent history reviewed, required imaging and test results available.  Performed  Procedure:  1. Patient placed on cardiac monitor, pulse oximetry, supplemental oxygen as necessary.  2. Sedation administered per anesthesia 3. Pacer pads placed anterior and posterior chest. 4. Cardioverted 1 time(s).  5. Cardioverted at 150J biphasic.  Complications:  Complications: After cardioversion the patient had a short 2-3 second asystolic period, followed by a few sinus beats and then  went asystolic.  Temporary epicardial pacing wires were  in place from surgery and they were quickly re-activated and the patient was AV paced at 80 bpm, with 10 mA atrial and 15 mA ventricular settings allowing for capture.  There was no apparent underlying rhythm.    Patient did not tolerate procedure well, but was stable at the end of the case. She was awake and talking and will be transferred to CCU for closer monitoring. I called CT surgery to notify them that the patient was being transferred to CCU and was stably paced, however, they were in the OR.  Impression:  1.  No LAA thrombus. 2. Normal functioning bioprosthetic aortic valve without significant pannus or insufficiency. 3. Preserved LVEF of 55%. 4. Aortic replacement was not entirely visualized but appears stable without any root leak. 5. Successful termination of atrial flutter with a single shock, however, there was asystole after cardioversion resulting in temporary pacing.  Recommendations:  1. Continue temporary epicardial pacing and transfer to the CCU for closer monitoring. The conduction system abnormality may be due to post-operative edema or may indicate significant native pacemaker dysfunction.  Would attempt to wean pacing tomorrow to see if underlying rhythm has recovered.  Time Spent Directly with the Patient:  75 minutes   Pixie Casino, MD, [facility] Attending Cardiologist [facility] [place]  07/15/2012, 12:13 PM

## 2012-07-15 NOTE — Progress Notes (Signed)
TCTS  Events noted  I checked underlying rate this pm -- 30/min Will stop amiodarone, dig and metoprolol and follow Keep in CCU for now Resume Phase I cardiac rehab tomorrow-- temp wires have a good threshold

## 2012-07-15 NOTE — Progress Notes (Signed)
CARDIAC REHAB PHASE I   PRE:  Rate/Rhythm: 124 aflutter    BP: sitting 100/71    SaO2: 94 2L   MODE:  Ambulation: 550 ft   POST:  Rate/Rhythm: 119 aflutter    BP: sitting 104/59     SaO2: 94 2L  Tolerated well. Steady with RW and 2L. Feels anxious/overwhelmed with all of her procedures. Assisted to w/c for TEE after walk.  [place], ACSM

## 2012-07-15 NOTE — Anesthesia Postprocedure Evaluation (Signed)
  Anesthesia Post-op Note  Patient: [name] [name]  Procedure(s) Performed: Procedure(s) (LRB) with comments: TRANSESOPHAGEAL ECHOCARDIOGRAM (TEE) (N/A) CARDIOVERSION (N/A)  Patient Location: endoscopy, pt will be transferred to 2900 per Dr. Debara Pickett for monitoring  Anesthesia Type:General  Level of Consciousness: awake and alert   Airway and Oxygen Therapy: Patient Spontanous Breathing and Patient connected to face mask oxygen  Post-op Pain: none  Post-op Assessment: Post-op Vital signs reviewed, Patient's Cardiovascular Status Stable, Respiratory Function Stable, Patent Airway and No signs of Nausea or vomiting  Post-op Vital Signs: Reviewed and stable  Complications: No apparent anesthesia complications

## 2012-07-15 NOTE — Anesthesia Preprocedure Evaluation (Signed)
Anesthesia Evaluation  Patient identified by MRN, date of birth, ID band Patient awake    Reviewed: Allergy & Precautions, H&P , NPO status , Patient's Chart, lab work & pertinent test results, reviewed documented beta blocker date and time   Airway       Dental  (+) Dental Advisory Given   Pulmonary          Cardiovascular + Peripheral Vascular Disease + dysrhythmias Atrial Fibrillation  S/p replacement aortic aneurysm   Neuro/Psych  Headaches,    GI/Hepatic GERD-  ,  Endo/Other    Renal/GU ARFRenal disease     Musculoskeletal   Abdominal   Peds  Hematology   Anesthesia Other Findings   Reproductive/Obstetrics                           Anesthesia Physical Anesthesia Plan  ASA: III  Anesthesia Plan: General   Post-op Pain Management:    Induction: Intravenous  Airway Management Planned: Mask  Additional Equipment:   Intra-op Plan:   Post-operative Plan:   Informed Consent: I have reviewed the patients History and Physical, chart, labs and discussed the procedure including the risks, benefits and alternatives for the proposed anesthesia with the patient or authorized representative who has indicated his/her understanding and acceptance.   Dental advisory given  Plan Discussed with: Anesthesiologist and Surgeon  Anesthesia Plan Comments:         Anesthesia Quick Evaluation

## 2012-07-15 NOTE — Progress Notes (Signed)
ANTICOAGULATION CONSULT NOTE - Follow UP  Pharmacy Consult for coumadin/lovenox Indication: atrial fibrillation  Allergies  Allergen Reactions  . Other Nausea And Vomiting    Headache also  Also from artificial sweeetners  . Codeine Nausea And Vomiting   Patient Measurements: Height: 5\' 1"  (154.9 cm) Weight: 134 lb 4.2 oz (60.9 kg) IBW/kg (Calculated) : 47.8   Vital Signs: Temp: 98.5 F (36.9 C) (12/11 1020) Temp src: Oral (12/11 1020) BP: 101/68 mmHg (12/11 1235) Pulse Rate: 118  (12/11 0615)  Labs:  Basename 07/15/12 0425 07/13/12 0615  HGB 9.5* 9.9*  HCT 28.1* 29.3*  PLT 213 161  APTT -- --  LABPROT 14.7 --  INR 1.17 --  HEPARINUNFRC -- --  CREATININE 1.14* 1.11*  CKTOTAL -- --  CKMB -- --  TROPONINI -- --   Estimated Creatinine Clearance: 36.8 ml/min (by C-G formula based on Cr of 1.14).  Medical History: Past Medical History  Diagnosis Date  . 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 H/O aortic valve disorder 10/04/2002    Dr. Tharon Aquas Trigt  . Thoracic ascending aortic aneurysm     CTA CHEST 05/11/12  . History of migraine headaches   . Headache   . Migraines   . Heart murmur   . GERD (gastroesophageal reflux disease)   . Arthritis   . Dysrhythmia    Medications:  Prescriptions prior to admission  Medication Sig Dispense Refill  . aspirin EC 81 MG tablet Take 81 mg by mouth daily.      . Calcium-Magnesium-Vitamin D (CALCIUM 500 PO) Take 500 mg by mouth 2 (two) times daily.      . Cholecalciferol (VITAMIN D) 2000 UNITS CAPS Take 2,000 Units by mouth 2 (two) times daily.      [name] CINNAMON PO Take 2,000 mg by mouth daily.       . fish oil-omega-3 fatty acids 1000 MG capsule Take 2 g by mouth daily.       [name] lisinopril (PRINIVIL,ZESTRIL) 5 MG tablet Take 5 mg by mouth daily.      . Magnesium (MAGNACAPS PO) Take 1 tablet by mouth daily.      . Multiple Vitamins-Minerals (OCUVITE PO) Take 1 tablet by mouth daily.       . naproxen sodium (ANAPROX)  220 MG tablet Take 220 mg by mouth 2 (two) times daily with a meal. For pain      . pantoprazole (PROTONIX) 40 MG tablet Take 40 mg by mouth daily.      Vladimir Faster Glycol-Propyl Glycol (SYSTANE OP) Place 1 drop into both eyes daily.      . propranolol (INDERAL) 20 MG tablet Take 20 mg by mouth 2 (two) times daily.       . ranitidine (ZANTAC) 75 MG tablet Take 75 mg by mouth daily as needed. indigestion      . SUMAtriptan (IMITREX) 50 MG tablet Take 50 mg by mouth every 2 (two) hours as needed. For migraines       Assessment: 71 yo female s/p re-do sternotomy, replacement ascending thoracic aorta (7 days post-op). Patient is noted with afib and to start coumadin (baseline INR=1.24). She was on lovenox 40mg  q12h and last dose was about 10:00am today. Pharmacy to dose lovenox and coumadin- patient noted for cardioversion on 12/11.  Goal of Therapy:  INR 2-3 Monitor platelets by anticoagulation protocol: Yes   Plan:  -Will continue lovenox to 60mg  q12h -Coumadin 5mg  po today -Daily PT/INR -  CBC every 3 days   Rober Minion, PharmD., MS Clinical Pharmacist Pager:  [phone] Thank you for allowing pharmacy to be part of this patients care team.

## 2012-07-15 NOTE — Anesthesia Procedure Notes (Signed)
Date/Time: 07/15/2012 11:28 AM Performed by: Carola Frost Pre-anesthesia Checklist: Patient identified, Patient being monitored, Emergency Drugs available, Suction available and Timeout performed Patient Re-evaluated:Patient Re-evaluated prior to inductionOxygen Delivery Method: Ambu bag Preoxygenation: Pre-oxygenation with 100% oxygen Intubation Type: IV induction Placement Confirmation: positive ETCO2 and breath sounds checked- equal and bilateral Dental Injury: Teeth and Oropharynx as per pre-operative assessment

## 2012-07-15 NOTE — Preoperative (Signed)
Beta Blockers   Reason not to administer Beta Blockers:Not Applicable, pt took [id number]

## 2012-07-16 ENCOUNTER — Encounter (HOSPITAL_COMMUNITY): Payer: Self-pay | Admitting: Internal Medicine

## 2012-07-16 ENCOUNTER — Inpatient Hospital Stay (HOSPITAL_COMMUNITY): Payer: Medicare Other

## 2012-07-16 LAB — BASIC METABOLIC PANEL
BUN: 13 mg/dL (ref 6–23)
CO2: 28 mEq/L (ref 19–32)
Calcium: 9 mg/dL (ref 8.4–10.5)
Chloride: 99 mEq/L (ref 96–112)
Creatinine, Ser: 1.02 mg/dL (ref 0.50–1.10)
GFR calc Af Amer: 62 mL/min — ABNORMAL LOW (ref >90)
GFR calc non Af Amer: 53 mL/min — ABNORMAL LOW (ref >90)
Glucose, Bld: 87 mg/dL (ref 70–99)
Potassium: 4.4 mEq/L (ref 3.5–5.1)
Sodium: 134 mEq/L — ABNORMAL LOW (ref 135–145)

## 2012-07-16 LAB — CBC
HCT: 27 % — ABNORMAL LOW (ref 36.0–46.0)
RDW: 15 % (ref 11.5–15.5)
WBC: 5.1 10*3/uL (ref 4.0–10.5)

## 2012-07-16 LAB — PROTIME-INR: INR: 1.19 (ref 0.00–1.49)

## 2012-07-16 MED ORDER — PHENOL 1.4 % MT LIQD
1.0000 | OROMUCOSAL | Status: DC | PRN
  Administered 2012-07-16: 1 via OROMUCOSAL
  Filled 2012-07-16 (×2): qty 177

## 2012-07-16 NOTE — Progress Notes (Signed)
Seen and agree with SPT note Veronica Fretz Tabor Vinh Sachs, PT [phone]  

## 2012-07-16 NOTE — Progress Notes (Signed)
Physical Therapy Treatment Patient Details Name: [name] [name] MRN: [id number] DOB: 04-Jun-1941 Today's Date: 07/16/2012 Time: [id number] PT Time Calculation (min): 32 min  PT Assessment / Plan / Recommendation Comments on Treatment Session  Pt. admitted s/p ascending aorta repair for aortic aneurysm; Pt. states she has been feeling "worse" since her cardioversion however pt. moving well during session today. Pts. BP 99/46 (59) after ambulation so pts. feet elevated and pt. instructed to perform LE exercises; at retake BP 98/48 (61) and RN notified. Continue to cue pt. on sternal precautions especially with stand -> sit.    Follow Up Recommendations  Home health PT           Equipment Recommendations  Rolling walker with 5" wheels       Frequency Min 3X/week   Plan Discharge plan remains appropriate;Frequency remains appropriate    Precautions / Restrictions Precautions Precautions: Sternal Precaution Comments: External pacemaker Restrictions Weight Bearing Restrictions: No   Pertinent Vitals/Pain O2 93% at start of tx; 94% after ambulation; 91% at end of tx HR 78 at start of tx; 78-105 during ambulation; 77 at end of tx    Mobility  Bed Mobility Bed Mobility: Not assessed Details for Bed Mobility Assistance: Pt. sitting in chair. Transfers Sit to Stand: 5: Supervision;With armrests;From chair/3-in-1;From toilet Stand to Sit: 5: Supervision;To chair/3-in-1;With armrests;To toilet Details for Transfer Assistance: Performed x2. Pt. hugged pillow when standing and cued to place hands on knees when sitting. Able to follow sternal precautions 2/4 when sitting and 4/4 when standing without cueing. Ambulation/Gait Ambulation/Gait Assistance: 5: Supervision Ambulation Distance (Feet): 500 Feet Assistive device: Rolling walker Ambulation/Gait Assistance Details: Pt. required cueing to stand upright. Gait Pattern: Step-through pattern;Trunk flexed    Exercises General  Exercises - Lower Extremity Ankle Circles/Pumps: AROM;Both;Other reps (comment);Seated (40 reps; low BP) Long Arc Quad: AROM;Both;20 reps;Seated Hip ABduction/ADduction: AROM;Both;20 reps;Seated Hip Flexion/Marching: AROM;Both;20 reps;Seated Toe Raises: AROM;Both;20 reps;Seated     PT Goals Acute Rehab PT Goals PT Goal: Sit to Stand - Progress: Progressing toward goal PT Goal: Ambulate - Progress: Progressing toward goal Additional Goals PT Goal: Additional Goal #1 - Progress: Progressing toward goal  Visit Information  Last PT Received On: 07/16/12 Assistance Needed: +1    Subjective Data  Subjective: "I feel like I could go for another walk."   Cognition  Overall Cognitive Status: Appears within functional limits for tasks assessed/performed Arousal/Alertness: Awake/alert Orientation Level: Appears intact for tasks assessed Behavior During Session: [facility] for tasks performed       End of Session PT - End of Session Equipment Utilized During Treatment: Gait belt;Oxygen Activity Tolerance: Patient tolerated treatment well Patient left: in chair;with call bell/phone within reach;with family/visitor present (Husband present) Nurse Communication: Mobility status     Earma Reading SPT 07/16/2012, 11:47 AM

## 2012-07-16 NOTE — Progress Notes (Signed)
ANTICOAGULATION CONSULT NOTE - Follow Up Consult  Pharmacy Consult for enoxaparin/warf Indication: atrial fibrillation  Allergies  Allergen Reactions  . Other Nausea And Vomiting    Headache also  Also from artificial sweeetners  . Codeine Nausea And Vomiting    Patient Measurements: Height: 5\' 1"  (154.9 cm) Weight: 135 lb 9.3 oz (61.5 kg) IBW/kg (Calculated) : 47.8    Vital Signs: Temp: 98.2 F (36.8 C) (12/12 0400) Temp src: Oral (12/12 0400) BP: 113/69 mmHg (12/12 0700) Pulse Rate: 88  (12/12 0700)  Labs:  Basename 07/16/12 0422 07/15/12 0425  HGB 9.1* 9.5*  HCT 27.0* 28.1*  PLT 211 213  APTT -- --  LABPROT 14.9 14.7  INR 1.19 1.17  HEPARINUNFRC -- --  CREATININE 1.02 1.14*  CKTOTAL -- --  CKMB -- --  TROPONINI -- --    Estimated Creatinine Clearance: 41.3 ml/min (by C-G formula based on Cr of 1.02).    Assessment: 71 yo female s/p re-do sternotomy, replacement ascending thoracic aorta (9 days post-op). Patient is noted with afib. Pharmacy consulted to dose Lovenox and Coumadin for atrial fibrillation.  Coumadin now on hold for now per cards in case placement of dual chamber pacer is needed. Pt is s/p cardioversion 12/11. Successful termination of atrial flutter with a single shock, however, there was asystole after cardioversion resulting in temporary pacing.  INR now 1.19 after 5mg  x 2. Hgb 9.1, plt ok. No bleeding noted.  Goal of Therapy:  Monitor platelets by anticoagulation protocol: Yes   Plan:  - Continue enoxaparin 60mg  [place] q12h - F/u coumadin re-start, op plans - Daily PT/INR - BC every 3 days  Thank you for the consult.  Johny Drilling, PharmD Clinical Pharmacist Pager: [phone] Pharmacy: [phone] 07/16/2012 9:42 AM

## 2012-07-16 NOTE — Care Management Note (Signed)
    Page 1 of 2   07/16/2012     11:43:40 AM   CARE MANAGEMENT NOTE [id number]  Patient:  MAIZE, LINEBERGER   Account Number:  [id number]  Date Initiated:  07/08/2012  Documentation initiated by:  MAYO,HENRIETTA  Subjective/Objective Assessment:   71 yr-old female adm with dx of aortic aneurysm     Action/Plan:   PTA, PT INDEPENDENT, LIVES WITH SPOUSE.   Anticipated DC Date:  07/20/2012   Anticipated DC Plan:  [place]  CM consult      PAC Choice  [place]   Choice offered to / List presented to:  C-1 Patient   DME arranged  Vassie Moselle      DME agency  [place] arranged  HH-2 PT      [place]   Status of service:  Completed, signed off Medicare Important Message given?   (If response is "NO", the following Medicare IM given date fields will be blank) Date Medicare IM given:   Date Additional Medicare IM given:    Discharge Disposition:  [place]  Per UR Regulation:  Reviewed for med. necessity/level of care/duration of stay  If discussed at Long Length of Stay Meetings, dates discussed:    Comments:  PCP:  Dr Ann Held with [facility] 12/11 14:00 debbie Majestic Molony rn,bsn pt transf to ccu after tee. lives w spouse. may need hhpt and rw. will follow progress and assist.

## 2012-07-16 NOTE — Progress Notes (Signed)
CARDIAC REHAB PHASE I   PRE:  Rate/Rhythm: 7 SR with pacing every few bts    BP: sitting 100/36    SaO2: 94 2L  MODE:  Ambulation: 350 ft   POST:  Rate/Rhythm: 86 SR with irregular rate    BP: sitting 107/49     SaO2: ?89 2L, other finger was 94 2L  Pt feeling overwhelmed but no major c/o of this am. Steady with RW, 2L O2, assist x1 for equipment. With activity no pacing spikes, rate irregular at 86 (had p waves). To BR on return to room, in BR had a few paced bts. On return to recliner pt had several minutes of 70s SR, regular rate, no pacing. After a few minutes, started pacing on demand again. Pt seemingly asymptomatic to rhythm changes except c/o being cold/shaking after walk. Covered with warm blankets. Will f/u. Hard to tell if requiring O2. [id number]  Darrick Meigs CES, ACSM

## 2012-07-16 NOTE — Progress Notes (Signed)
[facility]  Subjective: Feels generally well. Continues to have episodes of sinus pauses, but no evidence of AV block. Turned TPM down to 50 bpm back up pacing and extended AV delay to 270 ms. She has an average rate of 70 bpm, but continues to have post-extrasystolic sinus pauses and possible SA block , MT1 (SA Wenckebach).    Objective: Vital signs in last 24 hours: Temp:  [98.2 F (36.8 C)-98.7 F (37.1 C)] 98.2 F (36.8 C) (12/12 0400) Pulse Rate:  [80-88] 88  (12/12 0700) Resp:  [15-25] 17  (12/12 0700) BP: (95-123)/(43-81) 113/69 mmHg (12/12 0700) SpO2:  [91 %-100 %] 95 % (12/12 0600) Weight:  [61.5 kg (135 lb 9.3 oz)] 61.5 kg (135 lb 9.3 oz) (12/12 0500) Last BM Date: 07/15/12  Intake/Output from previous day: 12/11 0701 - 12/12 0700 In: 370 [P.O.:120; I.V.:250] Out: 600 [Urine:600] Intake/Output this shift:    Medications Current Facility-Administered Medications  Medication Dose Route Frequency Provider Last Rate Last Dose  . 0.9 %  sodium chloride infusion  250 mL Intravenous PRN Ivin Poot, MD      . 0.9 %  sodium chloride infusion   Intravenous Continuous Troy Sine, MD 50 mL/hr at 07/14/12 1014    . acetaminophen (TYLENOL) tablet 650 mg  650 mg Oral Q6H PRN Ivin Poot, MD      . beta carotene w/minerals (OCUVITE) tablet 1 tablet  1 tablet Oral Daily Nani Skillern, PA   1 tablet at 07/14/12 1014  . budesonide-formoterol (SYMBICORT) 160-4.5 MCG/ACT inhaler 2 puff  2 puff Inhalation BID Ivin Poot, MD   2 puff at 07/15/12 2011  . butamben-tetracaine-benzocaine (CETACAINE) spray    PRN Pixie Casino, MD   2 spray at 07/15/12 1100  . enoxaparin (LOVENOX) injection 60 mg  60 mg Subcutaneous Q12H Dareen Piano, PHARMD   60 mg at 07/15/12 2132  . fentaNYL (SUBLIMAZE) injection    PRN Pixie Casino, MD   25 mcg at 07/15/12 1111  . ferrous [id number] C-folic acid (TRINSICON / FOLTRIN) capsule 1  capsule  1 capsule Oral TID PC Ivin Poot, MD   1 capsule at 07/15/12 1638  . furosemide (LASIX) tablet 40 mg  40 mg Oral Daily Ivin Poot, MD   40 mg at 07/14/12 1014  . hydrocortisone cream 1 %   Topical TID Coolidge Breeze, PA      . lactulose (CHRONULAC) 10 GM/15ML solution 30 g  30 g Oral Daily PRN Ellwood Handler, PA      . levalbuterol (XOPENEX) nebulizer solution 1.25 mg  1.25 mg Nebulization BID Ivin Poot, MD   1.25 mg at 07/15/12 2010  . lidocaine (XYLOCAINE) 2 % viscous mouth solution    PRN Pixie Casino, MD   20 mL at 07/15/12 1100  . magnesium hydroxide (MILK OF MAGNESIA) suspension 30 mL  30 mL Oral Daily PRN Ivin Poot, MD      . nystatin cream (MYCOSTATIN)   Topical BID Tarri Fuller, PA      . ondansetron [facility]) injection 4 mg  4 mg Intravenous Q6H PRN Nani Skillern, PA   4 mg at 07/10/12 0917  . pantoprazole (PROTONIX) EC tablet 40 mg  40 mg Oral Daily Nani Skillern, PA   40 mg at 07/14/12 1014  . polyvinyl alcohol (LIQUIFILM TEARS) 1.4 % ophthalmic solution 1 drop  1 drop Both Eyes Daily  PRN Tharon Aquas Trigt, MD      . potassium chloride SA (K-DUR,KLOR-CON) CR tablet 20 mEq  20 mEq Oral Daily Ivin Poot, MD   20 mEq at 07/14/12 1014  . promethazine (PHENERGAN) injection 12.5 mg  12.5 mg Intravenous Q6H PRN Ivin Poot, MD      . sodium chloride 0.9 % injection 10-40 mL  10-40 mL Intracatheter PRN Ivin Poot, MD      . sodium chloride 0.9 % injection 3 mL  3 mL Intravenous Q12H Nani Skillern, PA   3 mL at 07/13/12 1000  . sodium chloride 0.9 % injection 3 mL  3 mL Intravenous PRN Nani Skillern, PA   3 mL at 07/09/12 0943  . sodium chloride 0.9 % injection 3 mL  3 mL Intravenous Q12H Ivin Poot, MD   3 mL at 07/12/12 2143  . sodium chloride 0.9 % injection 3 mL  3 mL Intravenous PRN Ivin Poot, MD      . SUMAtriptan [facility]) tablet 50 mg  50 mg Oral BID PRN Ivin Poot, MD      . traMADol Veatrice Bourbon) tablet  50 mg  50 mg Oral Q6H PRN Ivin Poot, MD   50 mg at 07/15/12 1639  . [COMPLETED] warfarin (COUMADIN) tablet 5 mg  5 mg Oral ONCE-1800 Nani Skillern, PA   5 mg at 07/15/12 1841  . Warfarin - Pharmacist Dosing Inpatient   Does not apply q1800 Dareen Piano, [facility]      . [DISCONTINUED] amiodarone (PACERONE) tablet 400 mg  400 mg Oral BID Coolidge Breeze, PA   400 mg at 07/14/12 2203  . [DISCONTINUED] digoxin (LANOXIN) tablet 0.125 mg  0.125 mg Oral Daily Ivin Poot, MD   0.125 mg at 07/14/12 1503  . [DISCONTINUED] metoprolol (LOPRESSOR) injection 2.5 mg  2.5 mg Intravenous Q5 min PRN Tarri Fuller, PA   2.5 mg at 07/13/12 1102  . [DISCONTINUED] metoprolol tartrate (LOPRESSOR) tablet 12.5 mg  12.5 mg Oral Q6H Tarri Fuller, PA   12.5 mg at 07/15/12 [id number]  . [DISCONTINUED] midazolam (VERSED) injection    PRN Pixie Casino, MD   1 mg at 07/15/12 1111   Facility-Administered Medications Ordered in Other Encounters  Medication Dose Route Frequency Provider Last Rate Last Dose  . [DISCONTINUED] propofol (DIPRIVAN) 10 mg/mL bolus/IV push    PRN Carola Frost, CRNA   40 mg at 07/15/12 1128    PE:   Lab Results:   Basename 07/16/12 0422 07/15/12 0425  WBC 5.1 5.6  HGB 9.1* 9.5*  HCT 27.0* 28.1*  PLT 211 213   BMET  Basename 07/16/12 0422 07/15/12 0425  NA 134* 137  K 4.4 4.4  CL 99 98  CO2 28 30  GLUCOSE 87 103*  BUN 13 14  CREATININE 1.02 1.14*  CALCIUM 9.0 9.3   PT/INR  Basename 07/16/12 0422 07/15/12 0425  LABPROT 14.9 14.7  INR 1.19 1.17    Assessment/Plan    Active Problems:  Aortic root dilatation  aortic root repair 07/08/12  PAF (paroxysmal atrial fibrillation) post Aortic root replacement- Amio  Normal coronary arteries 2004, low risk Myoview 3/12  Acute renal insufficiency post op( SCr 1.38)  Plan:  S/P TEE/DCCV out of A-flutter but complicated by asystole requiring continuation of temp pacer.  BP stable.   LOS: 9 days   HAGER,  BRYAN 07/16/2012 7:46 AM  I have seen and examined the  patient along with HAGER, BRYAN, PA.  I have reviewed the chart, notes and new data.  I agree with PA/NP's note.  Will keep off amiodarone and watch rhythm another 24 hours. If pauses are asymptomatic and <2 seconds in duration, will treat conservatively. If pauses are lengthy or symptomatic or if AF recurs and antiarrhythmics are needed, then will need a dual chamber pacemaker. Hold off warfarin for now. Keep TPM at backup rate of 50.  Sanda Klein, MD, [place] [phone] 07/16/2012, 8:44 AM

## 2012-07-16 NOTE — Progress Notes (Signed)
[address]            [place] [address]          [phone]     1 Day Post-Op Procedure(s) (LRB): TRANSESOPHAGEAL ECHOCARDIOGRAM (TEE) (N/A) CARDIOVERSION (N/A)  Subjective: OOB in chair.  "I feel awful."  Generalized weakness. No specific complaints.   Objective: Vital signs in last 24 hours: Patient Vitals for the past 24 hrs:  BP Temp Temp src Pulse Resp SpO2 Weight  07/16/12 0700 113/69 mmHg - - 88  17  - -  07/16/12 0600 113/57 mmHg - - 88  16  95 % -  07/16/12 0500 106/62 mmHg - - 88  17  94 % 135 lb 9.3 oz (61.5 kg)  07/16/12 0400 - 98.2 F (36.8 C) Oral 88  23  93 % -  07/16/12 0300 105/50 mmHg - - 88  19  96 % -  07/16/12 0200 106/53 mmHg - - 88  15  95 % -  07/16/12 0100 102/54 mmHg - - 88  17  95 % -  07/16/12 0000 104/49 mmHg 98.4 F (36.9 C) - 88  17  98 % -  07/15/12 2300 109/54 mmHg - - 88  17  96 % -  07/15/12 2200 105/49 mmHg - - 88  15  99 % -  07/15/12 2100 121/57 mmHg - - 87  19  97 % -  07/15/12 2011 - - - - - 95 % -  07/15/12 2000 95/59 mmHg 98.4 F (36.9 C) Oral 88  18  93 % -  07/15/12 1900 100/65 mmHg - - 88  21  - -  07/15/12 1800 108/52 mmHg - - 88  16  - -  07/15/12 1700 106/63 mmHg - - 88  18  - -  07/15/12 1600 98/56 mmHg 98.7 F (37.1 C) Oral 80  16  95 % -  07/15/12 1500 108/57 mmHg - - 80  15  98 % -  07/15/12 1430 112/57 mmHg - - 80  16  - -  07/15/12 1400 103/50 mmHg - - 80  17  97 % -  07/15/12 1345 116/67 mmHg - - 80  21  - -  07/15/12 1330 105/58 mmHg - - 80  21  98 % -  07/15/12 1315 102/43 mmHg - - 80  17  - -  07/15/12 1300 105/61 mmHg - - 80  22  98 % -  07/15/12 1235 101/68 mmHg - - - 24  94 % -  07/15/12 1230 101/68 mmHg - - - 22  93 % -  07/15/12 1225 109/68 mmHg - - - 22  95 % -  07/15/12 1220 103/66 mmHg - - - 20  96 % -  07/15/12 1215 103/57 mmHg - - - 23  97 % -  07/15/12 1210 115/70 mmHg - - - 20  100 % -  07/15/12 1205 115/56 mmHg - - - 22  98 % -  07/15/12 1202 115/56  mmHg - - - 24  100 % -  07/15/12 1110 96/52 mmHg - - - 22  98 % -  07/15/12 1100 102/52 mmHg - - - 24  100 % -  07/15/12 1055 123/57 mmHg - - - 22  100 % -  07/15/12 1020 118/81 mmHg 98.5 F (36.9 C) Oral - 25  91 % -   Current Weight  07/16/12 135 lb 9.3 oz (61.5 kg)     Intake/Output from previous day: 12/11 0701 - 12/12 0700 In: 370 [P.O.:120; I.V.:250] Out: 600 [Urine:600]    PHYSICAL EXAM:  Heart: paced at 88 Lungs: Decreased BS in bases bilaterally Wound: Clean and dry Extremities: Mild LE edema.  R forearm and lower back rash significantly improved.    Lab Results: CBC: Basename 07/16/12 0422 07/15/12 0425  WBC 5.1 5.6  HGB 9.1* 9.5*  HCT 27.0* 28.1*  PLT 211 213   BMET:  Basename 07/16/12 0422 07/15/12 0425  NA 134* 137  K 4.4 4.4  CL 99 98  CO2 28 30  GLUCOSE 87 103*  BUN 13 14  CREATININE 1.02 1.14*  CALCIUM 9.0 9.3    PT/INR:  Basename 07/16/12 0422  LABPROT 14.9  INR 1.19    CXR: Findings: There is more airspace disease most consistent with mild  edema with effusions. Consolidation at the left lung base is  stable. Cardiomegaly is stable. The left central venous line is  unchanged in position overlying the expected SVC - RA junction.  IMPRESSION:  Worsening of probable edema pattern with effusions. No change in  left basilar consolidation.    Assessment/Plan: S/P Procedure(s) (LRB): TRANSESOPHAGEAL ECHOCARDIOGRAM (TEE) (N/A) CARDIOVERSION (N/A)  CV- Discussed with Dr. Ellyn Hack.  She is currently junctional bradycardia under pacer after cardioversion.  Off beta blocker and Amio.  BPs low normal, but stable.  Dr. Sallyanne Kuster to see [id number] PPM placement.    Expected postop blood loss anemia- H/H stable.  Deconditioning- Continue to work on ambulation as tolerated.  Vol overload- diurese.  LOS: 9 days    [name] [name] H 07/16/2012

## 2012-07-17 LAB — BASIC METABOLIC PANEL
BUN: 12 mg/dL (ref 6–23)
CO2: 31 mEq/L (ref 19–32)
Calcium: 9.3 mg/dL (ref 8.4–10.5)
Chloride: 97 mEq/L (ref 96–112)
Creatinine, Ser: 1.09 mg/dL (ref 0.50–1.10)
GFR calc Af Amer: 57 mL/min — ABNORMAL LOW (ref >90)
GFR calc non Af Amer: 49 mL/min — ABNORMAL LOW (ref >90)
Glucose, Bld: 108 mg/dL — ABNORMAL HIGH (ref 70–99)
Potassium: 4.3 mEq/L (ref 3.5–5.1)
Sodium: 135 mEq/L (ref 135–145)

## 2012-07-17 LAB — MAGNESIUM: Magnesium: 2.1 mg/dL (ref 1.5–2.5)

## 2012-07-17 LAB — PROTIME-INR
INR: 1.44 (ref 0.00–1.49)
Prothrombin Time: 17.2 seconds — ABNORMAL HIGH (ref 11.6–15.2)

## 2012-07-17 MED ORDER — SODIUM CHLORIDE 0.9 % IJ SOLN
10.0000 mL | Freq: Two times a day (BID) | INTRAMUSCULAR | Status: DC
  Administered 2012-07-17 – 2012-07-18 (×2): 10 mL via INTRAVENOUS

## 2012-07-17 MED ORDER — BUDESONIDE-FORMOTEROL FUMARATE 160-4.5 MCG/ACT IN AERO: 2.0000 | INHALATION_SPRAY | Freq: Two times a day (BID) | RESPIRATORY_TRACT | Status: DC

## 2012-07-17 MED ORDER — FERROUS SULFATE 325 (65 FE) MG PO TABS: 325.0000 mg | ORAL_TABLET | Freq: Every day | ORAL | Status: DC

## 2012-07-17 MED ORDER — TRAMADOL HCL 50 MG PO TABS: 50.0000 mg | ORAL_TABLET | Freq: Four times a day (QID) | ORAL | Status: DC | PRN

## 2012-07-17 MED ORDER — ENOXAPARIN SODIUM 40 MG/0.4ML ~~LOC~~ SOLN
40.0000 mg | SUBCUTANEOUS | Status: DC
  Administered 2012-07-17 – 2012-07-18 (×2): 40 mg via SUBCUTANEOUS
  Filled 2012-07-17 (×3): qty 0.4

## 2012-07-17 MED ORDER — BIOTENE DRY MOUTH MT LIQD
15.0000 mL | Freq: Two times a day (BID) | OROMUCOSAL | Status: DC
  Administered 2012-07-17 – 2012-07-18 (×4): 15 mL via OROMUCOSAL

## 2012-07-17 NOTE — Progress Notes (Signed)
CARDIAC REHAB PHASE I   PRE:  Rate/Rhythm: 81 SR    BP: sitting 101/52    SaO2: 93 2L, 83 RA  MODE:  Ambulation: 700 ft   POST:  Rate/Rhythm: 106 ST    BP: sitting 113/51     SaO2: 88-89 2L  Pt doing much better! Steady with RW. Only issue is low SaO2. Did not seem SOB walking/resting but did c/o SOB once she sat down after walk. Will need home O2. See below. Pt would like referral sent for CRPII in [place].  SATURATION QUALIFICATIONS: (This note is used to comply with regulatory documentation for home oxygen)  Patient Saturations on Room Air at Rest = 83%  Patient Saturations on Room Air while Ambulating = 80%  Patient Saturations on 2 Liters of oxygen while Ambulating = 90-89%  Please briefly explain why patient needs home oxygen: SaO2 in 80s at rest and walking.  [place], ACSM

## 2012-07-17 NOTE — Progress Notes (Signed)
[facility]  Subjective: No complaints. Denies CP, palpitations, lightheadedness/dizziness.   Objective: Vital signs in last 24 hours: Temp:  [98.2 F (36.8 C)-98.7 F (37.1 C)] 98.6 F (37 C) (12/13 0400) Pulse Rate:  [41-94] 79  (12/13 0700) Resp:  [14-16] 16  (12/13 0400) BP: (82-111)/(12-61) 96/53 mmHg (12/13 0700) SpO2:  [92 %-97 %] 93 % (12/13 0600) Weight:  [61.8 kg (136 lb 3.9 oz)] 61.8 kg (136 lb 3.9 oz) (12/13 0500) Last BM Date: 07/15/12  Intake/Output from previous day: 12/12 0701 - 12/13 0700 In: 722 [P.O.:720; IV Piggyback:2] Out: [id number] [Urine:1650] Intake/Output this shift:    Medications Current Facility-Administered Medications  Medication Dose Route Frequency Provider Last Rate Last Dose  . 0.9 %  sodium chloride infusion  250 mL Intravenous PRN Ivin Poot, MD      . 0.9 %  sodium chloride infusion   Intravenous Continuous Troy Sine, MD 50 mL/hr at 07/14/12 1014    . acetaminophen (TYLENOL) tablet 650 mg  650 mg Oral Q6H PRN Ivin Poot, MD      . beta carotene w/minerals (OCUVITE) tablet 1 tablet  1 tablet Oral Daily Nani Skillern, PA   1 tablet at 07/16/12 0929  . budesonide-formoterol (SYMBICORT) 160-4.5 MCG/ACT inhaler 2 puff  2 puff Inhalation BID Ivin Poot, MD   2 puff at 07/16/12 2000  . butamben-tetracaine-benzocaine (CETACAINE) spray    PRN Pixie Casino, MD   2 spray at 07/15/12 1100  . enoxaparin (LOVENOX) injection 60 mg  60 mg Subcutaneous Q12H Dareen Piano, PHARMD   60 mg at 07/16/12 2102  . fentaNYL (SUBLIMAZE) injection    PRN Pixie Casino, MD   25 mcg at 07/15/12 1111  . ferrous [id number] C-folic acid (TRINSICON / FOLTRIN) capsule 1 capsule  1 capsule Oral TID PC Ivin Poot, MD   1 capsule at 07/16/12 1843  . furosemide (LASIX) tablet 40 mg  40 mg Oral Daily Ivin Poot, MD   40 mg at 07/16/12 [id number]  . hydrocortisone cream 1 %   Topical TID Coolidge Breeze,  PA      . lactulose (CHRONULAC) 10 GM/15ML solution 30 g  30 g Oral Daily PRN Ellwood Handler, PA      . levalbuterol (XOPENEX) nebulizer solution 1.25 mg  1.25 mg Nebulization BID Ivin Poot, MD   1.25 mg at 07/16/12 1959  . lidocaine (XYLOCAINE) 2 % viscous mouth solution    PRN Pixie Casino, MD   20 mL at 07/15/12 1100  . magnesium hydroxide (MILK OF MAGNESIA) suspension 30 mL  30 mL Oral Daily PRN Ivin Poot, MD      . nystatin cream (MYCOSTATIN)   Topical BID Tarri Fuller, PA      . ondansetron [facility]) injection 4 mg  4 mg Intravenous Q6H PRN Nani Skillern, PA   4 mg at 07/16/12 1129  . pantoprazole (PROTONIX) EC tablet 40 mg  40 mg Oral Daily Nani Skillern, PA   40 mg at 07/16/12 0929  . phenol (CHLORASEPTIC) mouth spray 1 spray  1 spray Mouth/Throat PRN Tarri Fuller, PA   1 spray at 07/16/12 2059  . polyvinyl alcohol (LIQUIFILM TEARS) 1.4 % ophthalmic solution 1 drop  1 drop Both Eyes Daily PRN Ivin Poot, MD      . potassium chloride SA (K-DUR,KLOR-CON) CR tablet 20 mEq  20 mEq Oral Daily Tharon Aquas  Trigt, MD   20 mEq at 07/16/12 0929  . promethazine (PHENERGAN) injection 12.5 mg  12.5 mg Intravenous Q6H PRN Ivin Poot, MD      . sodium chloride 0.9 % injection 10 mL  10 mL Intravenous Q12H Ivin Poot, MD      . sodium chloride 0.9 % injection 10-40 mL  10-40 mL Intracatheter PRN Ivin Poot, MD      . sodium chloride 0.9 % injection 3 mL  3 mL Intravenous PRN Nani Skillern, PA   3 mL at 07/09/12 0943  . sodium chloride 0.9 % injection 3 mL  3 mL Intravenous PRN Ivin Poot, MD      . SUMAtriptan [facility]) tablet 50 mg  50 mg Oral BID PRN Ivin Poot, MD      . traMADol Veatrice Bourbon) tablet 50 mg  50 mg Oral Q6H PRN Ivin Poot, MD   50 mg at 07/16/12 2038  . Warfarin - Pharmacist Dosing Inpatient   Does not apply [place]        PE: General appearance: alert, cooperative and no distress Lungs: clear to  auscultation bilaterally Heart: regular rate and rhythm Extremities: no LEE Pulses: 2+ and symmetric Skin: warm and dry Neurologic: Grossly normal  Lab Results:   Basename 07/16/12 0422 07/15/12 0425  WBC 5.1 5.6  HGB 9.1* 9.5*  HCT 27.0* 28.1*  PLT 211 213   BMET  Basename 07/16/12 0422 07/15/12 0425  NA 134* 137  K 4.4 4.4  CL 99 98  CO2 28 30  GLUCOSE 87 103*  BUN 13 14  CREATININE 1.02 1.14*  CALCIUM 9.0 9.3   PT/INR  Basename 07/17/12 0445 07/16/12 0422 07/15/12 0425  LABPROT 17.2* 14.9 14.7  INR 1.44 1.19 1.17   Cholesterol No results found for this basename: CHOL in the last 72 hours Cardiac Enzymes No components found with this basename: TROPONIN:3, CKMB:3  Studies/Results: TEE 12/11  Impression:  1. No LAA thrombus. 2. Normal functioning bioprosthetic aortic valve without significant pannus or insufficiency. 3. Preserved LVEF of 55%. 4. Aortic replacement was not entirely visualized but appears stable without any root leak. 5. Successful termination of atrial flutter with a single shock, however, there was asystole after cardioversion resulting in temporary pacing.   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 No pauses in the last 24 hrs, maintaining NSR on telemetry. QTc appears extended. Does not appear to need pacemaker at this time. Will continue to monitor another 24 hrs. Will consider a 30 day monitor as an OP. Dr. Loletha Grayer to follow.      LOS: 10 days    HAGER, BRYAN 07/17/2012 7:49 AM  I have seen and examined the patient along with HAGER, BRYAN, PA.  I have reviewed the chart, notes and new data.  I agree with PA's note.  No further AF or tachyarrhythmia. I think we can defer pacemaker implantation.  Keep off amiodarone and hold off beta blockers at least temporarily. No warfarin unless AF recurs. Will arrange early follow-up in Cardiology clinic.  Sanda Klein, MD, [place] [phone] 07/17/2012, 8:17 AM

## 2012-07-17 NOTE — Progress Notes (Addendum)
                   [address]            [place] [address]          [phone]      2 Days Post-Op Procedure(s) (LRB): TRANSESOPHAGEAL ECHOCARDIOGRAM (TEE) (N/A) CARDIOVERSION (N/A)  Subjective: Patient's only complaint is dryness in her nose from oxygen  Objective: Vital signs in last 24 hours: Temp:  [98.4 F (36.9 C)-98.7 F (37.1 C)] 98.6 F (37 C) (12/13 0400) Pulse Rate:  [41-94] 79  (12/13 0700) Cardiac Rhythm:  [-] Normal sinus rhythm (12/13 0400) Resp:  [14-16] 16  (12/13 0400) BP: (82-111)/(12-61) 96/53 mmHg (12/13 0700) SpO2:  [92 %-97 %] 93 % (12/13 0600) Weight:  [61.8 kg (136 lb 3.9 oz)] 61.8 kg (136 lb 3.9 oz) (12/13 0500)  Pre op weight  59 kg Current Weight  07/17/12 61.8 kg (136 lb 3.9 oz)      Intake/Output from previous day: 12/12 0701 - 12/13 0700 In: 722 [P.O.:720; IV Piggyback:2] Out: [id number] [Urine:1650]   Physical Exam:  Cardiovascular:RRR Pulmonary: Clear to auscultation bilaterally; no rales, wheezes, or rhonchi. Abdomen: Soft, non tender, bowel sounds present. Extremities: Mild bilateral lower extremity edema. Wounds: Clean and dry.  No erythema or signs of infection.  Lab Results: CBC:  Basename 07/16/12 0422 07/15/12 0425  WBC 5.1 5.6  HGB 9.1* 9.5*  HCT 27.0* 28.1*  PLT 211 213   BMET:   Basename 07/16/12 0422 07/15/12 0425  NA 134* 137  K 4.4 4.4  CL 99 98  CO2 28 30  GLUCOSE 87 103*  BUN 13 14  CREATININE 1.02 1.14*  CALCIUM 9.0 9.3    PT/INR:  Lab Results  Component Value Date   INR 1.44 07/17/2012   INR 1.19 07/16/2012   INR 1.17 07/15/2012   ABG:  INR: Will add last result for INR, ABG once components are confirmed Will add last 4 CBG results once components are confirmed  Assessment/Plan:  1. CV - Previous a flutter with rate into 110's. S/p TEE with cardioversion. Had asystole. EPW were intact and used. She was AV paced at 80.  She was then junctional bradycardic. Has not been on  Lopressor or Amiodarone since then. Likely does not require Coumadin at this point.Per cardiology, no need for PPM at this time. She has not been paced recently (set at 50) as HR has been in the 80's.  2.  Pulmonary - Encourage incentive spirometer. Wean oxygen-on 2 L via [place]. 3.  Acute blood loss anemia - Last H and H this am 9.1 and 27.Continue Trinsicon 4.Will remove EPW either later today or in am 5.Volume overload-continue with diuresis 6.Continue with cardiac rehab 7.Will discuss discharge disposition with the surgeon-likely this weekend  ZIMMERMAN,DONIELLE MPA-C 07/17/2012,8:11 AM   patient examined and medical record reviewed,agree with above note. Leave EPW's until tomorrow VAN Wilber Oliphant 07/17/2012

## 2012-07-17 NOTE — Progress Notes (Signed)
Physical Therapy Treatment Patient Details Name: [name] [name] MRN: [id number] DOB: Nov 29, 1940 Today's Date: 07/17/2012 Time: 0732-0800 PT Time Calculation (min): 28 min  PT Assessment / Plan / Recommendation Comments on Treatment Session  Pt. admitted s/p ascending aorta repair for aortic aneurysm; Pt. feeling better today but states she feels unsteady when walking. Pt. does not present as unsteady so try ambulation without RW next tx. Pt. continues to need education to follow sternal precautions; pt. unable to state precautions but stated she knows she is not suppose to use her arms.    Follow Up Recommendations  Home health PT           Equipment Recommendations  Rolling walker with 5" wheels       Frequency Min 3X/week      Precautions / Restrictions Precautions Precautions: Sternal Precaution Comments: External pacemaker Restrictions Weight Bearing Restrictions: No   Pertinent Vitals/Pain BP 104/64 (73) O2 93-98% throughout HR 87 at start of tx; 86 at end of tx    Mobility  Bed Mobility Supine to Sit: 5: Supervision;HOB elevated (25 degrees) Sitting - Scoot to Edge of Bed: 5: Supervision Details for Bed Mobility Assistance: Pt. rerquired cueing for sequencing and hand placement in order to maintain sternal precuations. Transfers Sit to Stand: 5: Supervision;From bed Stand to Sit: 5: Supervision;To chair/3-in-1 Details for Transfer Assistance: Pt. required cueing for hand placement in order to maintain sternal precautions.  Ambulation/Gait Ambulation/Gait Assistance: 5: Supervision Ambulation Distance (Feet): 500 Feet Assistive device: Rolling walker Ambulation/Gait Assistance Details: Pt. required cueing to stand upright. Try without RW next tx. Gait Pattern: Step-through pattern;Trunk flexed    Exercises General Exercises - Lower Extremity Ankle Circles/Pumps: AROM;20 reps;Seated;Both Long Arc Quad: AROM;Both;20 reps;Seated Hip ABduction/ADduction:  AROM;Both;20 reps;Seated Straight Leg Raises: AROM;Both;10 reps;Seated Hip Flexion/Marching: AROM;Both;20 reps;Seated Toe Raises: AROM;Both;20 reps;Seated Heel Raises: AROM;Both;20 reps;Seated     PT Goals Acute Rehab PT Goals PT Goal: Supine/Side to Sit - Progress: Progressing toward goal PT Goal: Sit to Stand - Progress: Progressing toward goal PT Goal: Ambulate - Progress: Progressing toward goal Additional Goals PT Goal: Additional Goal #1 - Progress: Progressing toward goal  Visit Information  Last PT Received On: 07/17/12 Assistance Needed: +1    Subjective Data  Subjective: "I sat in the chair all day yesterday."   Cognition  Overall Cognitive Status: Appears within functional limits for tasks assessed/performed Arousal/Alertness: Awake/alert Orientation Level: Appears intact for tasks assessed Behavior During Session: [facility] for tasks performed       End of Session PT - End of Session Equipment Utilized During Treatment: Gait belt;Oxygen Activity Tolerance: Patient tolerated treatment well Patient left: in chair;with call bell/phone within reach Nurse Communication: Mobility status     Earma Reading SPT 07/17/2012, 8:51 AM

## 2012-07-17 NOTE — Discharge Summary (Signed)
Physician Discharge Summary  Patient ID: [name] [name] MRN: [id number] DOB/AGE: [birth year] 71 y.o.  Admit date: 07/07/2012 Discharge date: 07/18/2012  Admission Diagnoses: 1. 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 Redo sternotomy, resection and grafting of ascending fusiform thoracic aortic aneurysm with a 30-mm Hemashield straight graft  by Dr Prescott Gum on 07/07/2012.  2.TEE and Cardioversion done by Dr. Debara Pickett on 07/15/2012  History of Presenting Illness: This is a 71 year old Caucasian female with a known  fusiform ascending thoracic aortic aneurysm now measuring 6 cm. She had a 21 mm Edwards pericardial tissue valve placed in 2004. A recent transesophageal echo showed the prosthetic valve be functioning well without significant stenosis or insufficiency. The aneurysm is asymptomatic. Her LV function is good. She had a recent left and right heart catheterization by Dr. [name] which demonstrated normal coronaries and normal right heart pressures. She denies any symptoms of CHF.  She also denies any recent pulmonary symptoms of upper respiratory infection or pneumonia. She was seen and evaluated by Dr. Prescott Gum for the consideration of a redo sternotomy and resection and grafting of her fusiform thoracic aortic aneurysm. Potential risks, complications, and benefits of the surgery were discussed with the patient and she agreed to proceed. She was admitted on 07/07/2012 in order to undergo the aforementioned surgery.  Brief Hospital Course:  She was extubated later the evening of surgery. She was weaned off of Dopamine. Her Gordy Councilman, a  line, chest tubes, and foley were all removed early in her post operative course. She was volume overloaded and diuresed. She was started on Lopressor, which was titrated accordingly. She then went into afib. She was given Amiodarone IV. She then had bradycardia, was externally paced, and her Lopressor was held. Her afib then was RVR. She was again given Amiodarone IV. Her SBP was in the 90's and again she was not on Lopressor. Her H and H went down to 7.9 and 22.4. She was given a unit of PRBCs.She did require a couple liters of oxygen via [place] post op. She was felt surgically stable for transfer from the ICU to PCTU for further convalescence on 07/10/2012. She then went into aflutter with RVR. She was continued on oral Amiodarone. She was placed on Digoxin.Rapid a pacing was attempted but she did not convert permanently to sinus rhythm. She was being given fluids for hypotension.       Cardiology wanted to do a TEE and cardioversion. She was anticoagulated. The aforementioned was done on 07/15/2012.She had asystole following the procedure. She had temporary pacing wires in place and these were used immediately. She was AV paced at 80. She was then transferred to CCU for close monitoring. Her Amiodarone and Digoxin were stopped. She later had episodes of brief pauses, but no evidence of AV block. Her HR was in the 70's and her external pacer was decreased to 50. It was decided since she was no longer in a fib or flutter, she would not need Coumadin. She was also not restarted on Lopressor or Amiodarone. Cardiology later determined she would not need a permanent pacemaker. She continued to maintain sinus rhythm. Per cardiology, she may need a  30 day monitor when ready for discharge.    She has been tolerating a diet and has had multiple bowel movements. She is still on 2 liters of oxygen via [place], as her oxygen saturation decreases into the 80's with ambulation. Her oxygen will hopefully be weaned prior to her  discharge.Her epicardial pacing wires and chest tube sutures will be removed prior to her discharge. Provided she remains afebrile, hemodynamically stable, and pending morning round evaluation, she will be surgically stable for discharge on 07/18/2012.   Latest Vital Signs: Blood pressure 106/57, pulse 82, temperature 98.4 F (36.9 C), temperature source Oral, resp. rate 18, height 5\' 1"  (1.549 m), weight 61.8 kg (136 lb 3.9 oz), SpO2 95.00%.  Physical Exam: Cardiovascular:RRR  Pulmonary: Clear to auscultation bilaterally; no rales, wheezes, or rhonchi.  Abdomen: Soft, non tender, bowel sounds present.  Extremities: Mild bilateral lower extremity edema.  Wounds: Clean and dry. No erythema or signs of infection.  Discharge Condition:Stable  Recent laboratory studies:  Lab Results  Component Value Date   WBC 5.1 07/16/2012   HGB 9.1* 07/16/2012   HCT 27.0* 07/16/2012   MCV 94.1 07/16/2012   PLT 211 07/16/2012   Lab Results  Component Value Date   NA 135 07/17/2012   K 4.3 07/17/2012   CL 97 07/17/2012   CO2 31 07/17/2012   CREATININE 1.09 07/17/2012   GLUCOSE 108* 07/17/2012      Diagnostic Studies: Dg Chest Port 1 View  07/16/2012  *RADIOLOGY REPORT*  Clinical Data: Chest pain, history of thoracic aortic aneurysm  PORTABLE CHEST - 1 VIEW  Comparison: Portable chest x-ray of 07/15/2012  Findings: There is more airspace disease most consistent with mild edema with effusions. Consolidation at the left lung base is stable.  Cardiomegaly is stable.  The left central venous line is unchanged in position overlying the expected SVC - RA junction.  IMPRESSION: Worsening of probable edema pattern with effusions.  No change in left basilar consolidation.   Original Report Authenticated By: Ivar Drape, M.D.     Discharge Orders    Future Appointments: Provider: Department: Dept Phone: Center:   08/10/2012 1:00 PM [facility] [place]  [phone] [facility]      Discharge Medications:   Medication List     As of 07/17/2012  1:51 PM    STOP taking these medications         lisinopril 5 MG tablet   Commonly known as: PRINIVIL,ZESTRIL      MAGNACAPS PO      naproxen sodium 220 MG tablet   Commonly known as: ANAPROX      propranolol 20 MG tablet   Commonly known as: INDERAL      TAKE these medications         aspirin EC 81 MG tablet   Take 81 mg by mouth daily.      budesonide-formoterol 160-4.5 MCG/ACT inhaler   Commonly known as: SYMBICORT   Inhale 2 puffs into the lungs 2 (two) times daily.      CALCIUM 500 PO   Take 500 mg by mouth 2 (two) times daily.      CINNAMON PO   Take 2,000 mg by mouth daily.      ferrous sulfate 325 (65 FE) MG tablet   Take 1 tablet (325 mg total) by mouth daily with breakfast. For one month then stop      fish oil-omega-3 fatty acids 1000 MG capsule   Take  2 g by mouth daily.      OCUVITE PO   Take 1 tablet by mouth daily.      pantoprazole 40 MG tablet   Commonly known as: PROTONIX   Take 40 mg by mouth daily.      ranitidine 75 MG tablet   Commonly known as: ZANTAC   Take 75 mg by mouth daily as needed. indigestion      SUMAtriptan 50 MG tablet   Commonly known as: IMITREX   Take 50 mg by mouth every 2 (two) hours as needed. For migraines      SYSTANE OP   Place 1 drop into both eyes daily.      traMADol 50 MG tablet   Commonly known as: ULTRAM   Take 1 tablet (50 mg total) by mouth every 6 (six) hours as needed for pain.      Vitamin D 2000 UNITS Caps   Take 2,000 Units by mouth 2 (two) times daily.          The patient has been discharged on:   1.Beta Blocker:  Yes [   ]                              No   [ x  ]                              If No, reason: Previous bradycardia, labile blood pressure  2.Ace Inhibitor/ARB: Yes [   ]                                     No  [    x]                                     If No, reason:Preserved  LVEF and labile blood pressure  3.Statin:   Yes [   ]                  No  [   x]                  If No, reason:No hyperlipidemiz  4.S[place]a [place]:  Yes  [  x ]                  No   [   ]                  If No, reason:  Follow Up Appointments:     Follow-up Information    Follow up with VAN Wilber Oliphant, MD. (PA/LAT CXR to be taken (at [place] which is in the same building as Dr. Lucianne Lei Trigt's office) on 08/10/2012 at 12:00 pm;Appointment with Dr. Lucianne Lei Trigt's physician assistant is on 08/10/2012 at 1:00 pm)    Contact information:   [address] [place] [address] [phone]       Follow up with Lorretta Harp, MD. (Call for follow up appointment for 2 weeks)    Contact information:   [address] [place] [address] [phone]       Follow up with Ann Held, MD. 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 [place] [address] [phone]          Signed: Nihira Puello MPA-C 07/17/2012, 1:11 PM

## 2012-07-17 NOTE — Progress Notes (Signed)
Seen and agree with SPT note Saul Dorsi Tabor Gerre Ranum, PT [phone]  

## 2012-07-18 ENCOUNTER — Encounter: Payer: Self-pay | Admitting: *Deleted

## 2012-07-18 NOTE — Progress Notes (Signed)
[facility]  DAILY PROGRESS NOTE   Subjective:  Feeling better, minimal nausea. No dyspnea, no dizziness. Monitor shows occasional post-PAC pauses, longest only 1.7 seconds, all asymptomatic. No AFib, No AV block.  Objective:  Temp:  [97.7 F (36.5 C)-99 F (37.2 C)] 98.3 F (36.8 C) (12/14 0445) Pulse Rate:  [82-91] 84  (12/14 0445) Resp:  [19-20] 19  (12/14 0445) BP: (87-113)/(45-64) 112/55 mmHg (12/14 0445) SpO2:  [82 %-98 %] 94 % (12/14 0445) Weight:  [61.5 kg (135 lb 9.3 oz)] 61.5 kg (135 lb 9.3 oz) (12/14 0445) Weight change: -0.3 kg (-10.6 oz)  Intake/Output from previous day: 12/13 0701 - 12/14 0700 In: 730 [P.O.:720; I.V.:10] Out: 1350 [Urine:1350]  Intake/Output from this shift: Total I/O In: -  Out: 600 [Urine:600]  Medications: Current Facility-Administered Medications  Medication Dose Route Frequency Provider Last Rate Last Dose  . acetaminophen (TYLENOL) tablet 650 mg  650 mg Oral Q6H PRN Ivin Poot, MD      . antiseptic oral rinse (BIOTENE) solution 15 mL  15 mL Mouth Rinse BID Ivin Poot, MD   15 mL at 07/17/12 1945  . beta carotene w/minerals (OCUVITE) tablet 1 tablet  1 tablet Oral Daily Nani Skillern, PA   1 tablet at 07/17/12 [phone]  . budesonide-formoterol (SYMBICORT) 160-4.5 MCG/ACT inhaler 2 puff  2 puff Inhalation BID Ivin Poot, MD   2 puff at 07/17/12 2126  . enoxaparin (LOVENOX) injection 40 mg  40 mg Subcutaneous Q24H Jazmon Kos, MD   40 mg at 07/17/12 1945  . ferrous Q000111Q C-folic acid (TRINSICON / FOLTRIN) capsule 1 capsule  1 capsule Oral TID PC Ivin Poot, MD   1 capsule at 07/17/12 1756  . furosemide (LASIX) tablet 40 mg  40 mg Oral Daily Ivin Poot, MD   40 mg at 07/17/12 0959  . hydrocortisone cream 1 %   Topical TID Coolidge Breeze, PA   1 application at [id number]  . lactulose (CHRONULAC) 10 GM/15ML solution 30 g  30 g Oral Daily PRN Ellwood Handler, PA      .  levalbuterol (XOPENEX) nebulizer solution 1.25 mg  1.25 mg Nebulization BID Ivin Poot, MD   1.25 mg at 07/17/12 2126  . magnesium hydroxide (MILK OF MAGNESIA) suspension 30 mL  30 mL Oral Daily PRN Ivin Poot, MD      . nystatin cream (MYCOSTATIN)   Topical BID Tarri Fuller, PA   1 application at [id number]  . ondansetron (ZOFRAN) injection 4 mg  4 mg Intravenous Q6H PRN Nani Skillern, PA   4 mg at 07/16/12 1129  . pantoprazole (PROTONIX) EC tablet 40 mg  40 mg Oral Daily Nani Skillern, PA   40 mg at 07/17/12 0959  . phenol (CHLORASEPTIC) mouth spray 1 spray  1 spray Mouth/Throat PRN Tarri Fuller, PA   1 spray at 07/16/12 2059  . polyvinyl alcohol (LIQUIFILM TEARS) 1.4 % ophthalmic solution 1 drop  1 drop Both Eyes Daily PRN Ivin Poot, MD      . potassium chloride SA (K-DUR,KLOR-CON) CR tablet 20 mEq  20 mEq Oral Daily Ivin Poot, MD   20 mEq at 07/17/12 0959  . promethazine (PHENERGAN) injection 12.5 mg  12.5 mg Intravenous Q6H PRN Ivin Poot, MD   12.5 mg at 07/18/12 0447  . sodium chloride 0.9 % injection 10 mL  10 mL Intravenous Q12H Tharon Aquas [place],  MD   10 mL at 07/17/12 2145  . sodium chloride 0.9 % injection 10-40 mL  10-40 mL Intracatheter PRN Ivin Poot, MD   10 mL at 07/18/12 0817  . sodium chloride 0.9 % injection 3 mL  3 mL Intravenous PRN Nani Skillern, PA   3 mL at 07/09/12 0943  . sodium chloride 0.9 % injection 3 mL  3 mL Intravenous PRN Ivin Poot, MD      . SUMAtriptan [facility]) tablet 50 mg  50 mg Oral BID PRN Ivin Poot, MD   50 mg at 07/18/12 0209  . traMADol (ULTRAM) tablet 50 mg  50 mg Oral Q6H PRN Ivin Poot, MD   50 mg at 07/17/12 1956    Physical Exam: General appearance: alert, cooperative and no distress Neck: no adenopathy, no carotid bruit, no JVD, supple, symmetrical, trachea midline and thyroid not enlarged, symmetric, no tenderness/mass/nodules Lungs: clear to auscultation bilaterally Heart:  regular rate and rhythm and S1, S2 normal Abdomen: soft, non-tender; bowel sounds normal; no masses,  no organomegaly Extremities: extremities normal, atraumatic, no cyanosis or edema Pulses: 2+ and symmetric Skin: Skin color, texture, turgor normal. No rashes or lesions Neurologic: Grossly normal  Lab Results: Results for orders placed during the hospital encounter of 07/07/12 (from the past 48 hour(s))  PROTIME-INR     Status: Abnormal   Collection Time   07/17/12  4:45 AM      Component Value Range Comment   Prothrombin Time 17.2 (*) 11.6 - 15.2 seconds    INR 1.44  0.00 - [id number]   BASIC METABOLIC PANEL     Status: Abnormal   Collection Time   07/17/12  8:30 AM      Component Value Range Comment   Sodium 135  135 - 145 mEq/L    Potassium 4.3  3.5 - 5.1 mEq/L    Chloride 97  96 - 112 mEq/L    CO2 31  19 - 32 mEq/L    Glucose, Bld 108 (*) 70 - 99 mg/dL    BUN 12  6 - 23 mg/dL    Creatinine, Ser 1.09  0.50 - 1.10 mg/dL    Calcium 9.3  8.4 - 10.5 mg/dL    GFR calc non Af Amer 49 (*) >90 mL/min    GFR calc Af Amer 57 (*) >90 mL/min   MAGNESIUM     Status: Normal   Collection Time   07/17/12  8:30 AM      Component Value Range Comment   Magnesium 2.1  1.5 - 2.5 mg/dL     Imaging: No results found.  Assessment:  1. Active Problems: 2.  Aortic root dilatation 3.  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  Normal coronary arteries 2004, low risk Myoview 3/12 6.  Acute renal insufficiency post op( SCr 1.38) 7. SSS  Plan:  1. We can defer pacemaker implantation. 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  2. Recommend a 30 day event monitor be performed no sooner than a month post-op. If AF is recorded she will need warfarin/equivalent anticoagulation. If she has AF with RVR requiring antiarrhythmic or rate control medications,  then she will need a dual chamber pacemaker.  Time Spent Directly with Patient:  30 minutes  Length of Stay:  LOS: 11 days  [name] [name] 07/18/2012, 8:30 AM

## 2012-07-18 NOTE — Progress Notes (Signed)
CARDIAC REHAB PHASE I   PRE:  Rate/Rhythm: 84 SR    BP: sitting 112/70    SaO2: 90 2L, 79 RA  MODE:  Ambulation: 690 ft   POST:  Rate/Rhythm: 97 SR    BP: sitting 130/70     SaO2: 96-97 2L  Tolerated very well with RW and 2L. Continues to need O2, SaO2 79 RA after a few feet. However monitored SaO2 entire walk and progressively increased on 2L. 97 2L after walk. To bed to pull wires. Sts she didn't rest well last night. Only able to pull 500 cc on IS. Sts she could do 1500 yesterday. Ed completed.  [id number]  Darrick Meigs CES, ACSM

## 2012-07-18 NOTE — Progress Notes (Addendum)
3 Days Post-Op Procedure(s) (LRB): TRANSESOPHAGEAL ECHOCARDIOGRAM (TEE) (N/A) CARDIOVERSION (N/A) Subjective:  Ms. Callicut complains of nausea overnight.  She denies chest pain, shortness of breath, palpitations. +BM  Objective: Vital signs in last 24 hours: Temp:  [97.7 F (36.5 C)-99 F (37.2 C)] 98.3 F (36.8 C) (12/14 0445) Pulse Rate:  [82-91] 84  (12/14 0445) Cardiac Rhythm:  [-] Normal sinus rhythm (12/13 2003) Resp:  [18-20] 19  (12/14 0445) BP: (87-113)/(45-64) 112/55 mmHg (12/14 0445) SpO2:  [82 %-98 %] 94 % (12/14 0445) Weight:  [135 lb 9.3 oz (61.5 kg)] 135 lb 9.3 oz (61.5 kg) (12/14 0445)  Intake/Output from previous day: 12/13 0701 - 12/14 0700 In: 730 [P.O.:720; I.V.:10] Out: 1350 [Urine:1350] Intake/Output this shift: Total I/O In: -  Out: 600 [Urine:600]  General appearance: alert, cooperative and no distress Heart: regular rate and rhythm Lungs: clear to auscultation bilaterally Abdomen: soft, non-tender; bowel sounds normal; no masses,  no organomegaly Extremities: edema trace Wound: clean and dry  Lab Results:  [facility] 07/16/12 0422  WBC 5.1  HGB 9.1*  HCT 27.0*  PLT 211   BMET:  Basename 07/17/12 0830 07/16/12 0422  NA 135 134*  K 4.3 4.4  CL 97 99  CO2 31 28  GLUCOSE 108* 87  BUN 12 13  CREATININE 1.09 1.02  CALCIUM 9.3 9.0    PT/INR:  Basename 07/17/12 0445  LABPROT 17.2*  INR 1.44   ABG    Component Value Date/Time   PHART 7.342* 07/08/2012 0353   HCO3 25.4* 07/08/2012 0353   TCO2 25 07/08/2012 1617   ACIDBASEDEF 1.0 07/08/2012 0353   O2SAT 93.0 07/08/2012 0353   CBG (last 3)  No results found for this basename: GLUCAP:3 in the last 72 hours  Assessment/Plan: S/P Procedure(s) (LRB): TRANSESOPHAGEAL ECHOCARDIOGRAM (TEE) (N/A) CARDIOVERSION (N/A)  1. CV- NSR, S/P TEE cardioversion- not on lopressor or Amiodarone due to bradycardia 2. Pulm- remains on oxygen, will continue to attempt to wean, however patient may require  home oxygen 3. Nausea- most likely related to constipation- patient had BM this morning 4. Dispo- will d/c EPW today, plan for discharge in AM if no problems arise  LOS: 11 days    BARRETT, [name] 07/18/2012   I have seen and examined the patient and agree with the assessment and plan as outlined.  Rhythm stable.  Still requiring O2.  Brand Siever H 07/18/2012 11:24 AM

## 2012-07-18 NOTE — Progress Notes (Signed)
EPW's pulled per order and unit protocol.  Pt tolerate very well, all tips intact, sites painted.  VSS, see flowsheet.  Pt understands bedrest for one hour, call bell in reach.  Will monitor closely.

## 2012-07-18 NOTE — Progress Notes (Signed)
Pt ambulated in hallway 400 feet with rolling walker, oxygen, and standby assist. Gait steady. Pt tolerated well.  Vella Raring, RN

## 2012-07-19 ENCOUNTER — Inpatient Hospital Stay (HOSPITAL_COMMUNITY): Payer: Medicare Other

## 2012-07-19 ENCOUNTER — Encounter (HOSPITAL_COMMUNITY): Payer: Self-pay | Admitting: Cardiology

## 2012-07-19 DIAGNOSIS — I469 Cardiac arrest, cause unspecified: Secondary | ICD-10-CM

## 2012-07-19 HISTORY — DX: Cardiac arrest, cause unspecified: I46.9

## 2012-07-19 MED ORDER — FUROSEMIDE 20 MG PO TABS: 20.0000 mg | ORAL_TABLET | Freq: Two times a day (BID) | ORAL | Status: DC

## 2012-07-19 MED ORDER — POTASSIUM CHLORIDE ER 10 MEQ PO TBCR: 10.0000 meq | EXTENDED_RELEASE_TABLET | Freq: Two times a day (BID) | ORAL | Status: DC

## 2012-07-19 NOTE — Progress Notes (Signed)
07/19/2012 10:09 AM Nursing note Pt. Resting 02 sat 92% on RA. Pt. Ambulated 75 ft in hallway, o2 sat decreased to 84% RA. 2L o2 [place] applied and o2 sat increased to 92%. Pt. Continued ambulating another 200 ft on 2L o2 [place]. Pt. Tolerated walk well. Encouraged further ambulation today.  Mysti Haley, Arville Lime

## 2012-07-19 NOTE — Progress Notes (Signed)
NCM spoke to pt. Instructed her to call [facility] as soon as she arrive home to deliver home oxygen. Explained portable only last 3-4 hours. Patient requesting RW.  [facility] notified. Jonnie Finner RN CCM Case Mgmt phone [phone]

## 2012-07-19 NOTE — Progress Notes (Addendum)
07/19/2012 12:42 PM Nursing note During discharge teaching pt. Had episode of vomiting after taking medications.  Upon reassessment pt. States she feels much better. Katheran Awe [facility] paged and made aware. No orders received at this time. Safe to discharge patient per PAC. Discharge avs form, medications already taken today and those due this evening given and explained to patient and family member. RX given to patient as well as explained location of called in RX. Follow up appointments, activity restrictions and when to call MD reviewed. Questions and concerns addressed. PICC line removed by IV team. CTS removed per order and steri strips applied to site. Pt. Received RW and o2 tank prior to d/c. Pt. Had [place] in place and o2 at 2L [place] on at time of discharge. Tele d/c. D/c home with family per orders. Pt and family viewed video #113 prior to d/c home.  Raylee Strehl, Arville Lime

## 2012-07-19 NOTE — Progress Notes (Signed)
07/19/2012 10:44 AM Nursing note After walking in hall with RN, pt. Expressed concerns about oxygen saturations during ambulation and inability of pt. To achieve prior IS goal of 1500. Pt. Now only able to pull 750 on IS. [name] Barrett PaC paged and made aware of pt. Concerns. Orders received. Will continue to closely monitor patient. Barack Nicodemus, Arville Lime

## 2012-07-19 NOTE — Progress Notes (Addendum)
4 Days Post-Op Procedure(s) (LRB): TRANSESOPHAGEAL ECHOCARDIOGRAM (TEE) (N/A) CARDIOVERSION (N/A) Subjective:  [name] has no complaints this morning.  She continues to require oxygen during ambulation  Objective: Vital signs in last 24 hours: Temp:  [98.1 F (36.7 C)-99.4 F (37.4 C)] 98.5 F (36.9 C) (12/15 0447) Pulse Rate:  [84-105] 84  (12/15 0447) Cardiac Rhythm:  [-] Normal sinus rhythm (12/15 0736) Resp:  [18] 18  (12/15 0447) BP: (94-111)/(52-82) 94/59 mmHg (12/15 0447) SpO2:  [91 %-95 %] 95 % (12/15 0447) Weight:  [134 lb (60.782 kg)] 134 lb (60.782 kg) (12/15 0447) Intake/Output from previous day: 12/14 0701 - 12/15 0700 In: 480 [P.O.:480] Out: 1000 [Urine:1000] Intake/Output this shift:  General appearance: alert, cooperative and no distress Neurologic: intact Heart: regular rate and rhythm Lungs: clear to auscultation bilaterally Abdomen: soft, non-tender; bowel sounds normal; no masses,  no organomegaly Extremities: edema trace Wound: clean and dry  Lab Results: No results found for this basename: WBC:2,HGB:2,HCT:2,PLT:2 in the last 72 hours BMET:  [facility] 07/17/12 0830  NA 135  K 4.3  CL 97  CO2 31  GLUCOSE 108*  BUN 12  CREATININE 1.09  CALCIUM 9.3    PT/INR:  Basename 07/17/12 0445  LABPROT 17.2*  INR 1.44   ABG    Component Value Date/Time   PHART 7.342* 07/08/2012 0353   HCO3 25.4* 07/08/2012 0353   TCO2 25 07/08/2012 1617   ACIDBASEDEF 1.0 07/08/2012 0353   O2SAT 93.0 07/08/2012 0353   CBG (last 3)  No results found for this basename: GLUCAP:3 in the last 72 hours  Assessment/Plan: S/P Procedure(s) (LRB): TRANSESOPHAGEAL ECHOCARDIOGRAM (TEE) (N/A) CARDIOVERSION (N/A)  1. CV- NSR, occasional PACs, S/P TEE Cardioversion, will hold Amiodarone and Lopressor due to Bradycardia 2. Pulm- remains on oxygen, will arrange home use 3. Dispo- patient doing well, will d/c home today    LOS: 12 days    [name], [name] 07/19/2012   I  have seen and examined the patient and agree with the assessment and plan as outlined.  Rexene Alberts 07/19/2012 12:36 PM

## 2012-07-19 NOTE — Progress Notes (Signed)
  11 Day Post-Op S/P Procedure(s) (LRB):  REPLACEMENT ASCENDING AORTA (N/A)  REDO STERNOTOMY   TEE DCCV  07/15/12  Subjective: No complaints, no SOB,  SR with PACs and pauses 1.8 sec at longest  Objective: Vital signs in last 24 hours: Temp:  [98.1 F (36.7 C)-99.4 F (37.4 C)] 98.5 F (36.9 C) (12/15 0447) Pulse Rate:  [84-105] 84  (12/15 0447) Resp:  [18] 18  (12/15 0447) BP: (94-111)/(52-82) 94/59 mmHg (12/15 0447) SpO2:  [91 %-95 %] 95 % (12/15 0447) Weight:  [60.782 kg (134 lb)] 60.782 kg (134 lb) (12/15 0447) Weight change: -0.718 kg (-1 lb 9.3 oz) Last BM Date: 07/18/12 Intake/Output from previous day: -520 12/14 0701 - 12/15 0700 In: 480 [P.O.:480] Out: 1000 [Urine:1000] Intake/Output this shift:    PE: General:alert and oriented, pleasant affect prob. D/c today Heart:S1S2 RRR Lungs:clear without rales or rhonchi Abd:+ BS, soft non tender Ext:no edema    Lab Results: No results found for this basename: WBC:2,HGB:2,HCT:2,PLT:2 in the last 72 hours BMET  Basename 07/17/12 0830  NA 135  K 4.3  CL 97  CO2 31  GLUCOSE 108*  BUN 12  CREATININE 1.09  CALCIUM 9.3   Lab Results  Component Value Date   HGBA1C 5.7* 07/06/2012         Studies/Results: No results found.  Medications: I have reviewed the patient's current medications.    [name] antiseptic oral rinse  15 mL Mouth Rinse BID  . beta carotene w/minerals  1 tablet Oral Daily  . budesonide-formoterol  2 puff Inhalation BID  . enoxaparin (LOVENOX) injection  40 mg Subcutaneous Q24H  . ferrous Q000111Q C-folic acid  1 capsule Oral TID PC  . furosemide  40 mg Oral Daily  . hydrocortisone cream   Topical TID  . levalbuterol  1.25 mg Nebulization BID  . nystatin cream   Topical BID  . pantoprazole  40 mg Oral Daily  . potassium chloride  20 mEq Oral Daily  . 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   LOS: 12 days   INGOLD,LAURA R 07/19/2012, 7:58 AM   I have seen and examined the patient along with [facility] R, NP.  I have reviewed the chart, notes and new data.  I agree with NP's note.  PLAN: 30 day event monitor will be performed no sooner than a month post-op.   Sanda Klein, MD, [place] [phone] 07/19/2012, 8:34 AM

## 2012-07-20 DIAGNOSIS — Z48812 Encounter for surgical aftercare following surgery on the circulatory system: Secondary | ICD-10-CM | POA: Diagnosis not present

## 2012-07-20 DIAGNOSIS — G43909 Migraine, unspecified, not intractable, without status migrainosus: Secondary | ICD-10-CM | POA: Diagnosis not present

## 2012-07-20 DIAGNOSIS — Z9181 History of falling: Secondary | ICD-10-CM | POA: Diagnosis not present

## 2012-07-20 DIAGNOSIS — R269 Unspecified abnormalities of gait and mobility: Secondary | ICD-10-CM | POA: Diagnosis not present

## 2012-07-20 DIAGNOSIS — Z9981 Dependence on supplemental oxygen: Secondary | ICD-10-CM | POA: Diagnosis not present

## 2012-07-20 DIAGNOSIS — I4891 Unspecified atrial fibrillation: Secondary | ICD-10-CM | POA: Diagnosis not present

## 2012-07-20 DIAGNOSIS — M159 Polyosteoarthritis, unspecified: Secondary | ICD-10-CM | POA: Diagnosis not present

## 2012-07-20 DIAGNOSIS — K219 Gastro-esophageal reflux disease without esophagitis: Secondary | ICD-10-CM | POA: Diagnosis not present

## 2012-07-20 DIAGNOSIS — K5900 Constipation, unspecified: Secondary | ICD-10-CM | POA: Diagnosis not present

## 2012-07-20 NOTE — Discharge Summary (Signed)
patient examined and medical record reviewed,agree with above note. VAN TRIGT III,Breaker Springer 07/20/2012

## 2012-07-22 DIAGNOSIS — M159 Polyosteoarthritis, unspecified: Secondary | ICD-10-CM | POA: Diagnosis not present

## 2012-07-22 DIAGNOSIS — G43909 Migraine, unspecified, not intractable, without status migrainosus: Secondary | ICD-10-CM | POA: Diagnosis not present

## 2012-07-22 DIAGNOSIS — K219 Gastro-esophageal reflux disease without esophagitis: Secondary | ICD-10-CM | POA: Diagnosis not present

## 2012-07-22 DIAGNOSIS — I4891 Unspecified atrial fibrillation: Secondary | ICD-10-CM | POA: Diagnosis not present

## 2012-07-22 DIAGNOSIS — Z48812 Encounter for surgical aftercare following surgery on the circulatory system: Secondary | ICD-10-CM | POA: Diagnosis not present

## 2012-07-22 DIAGNOSIS — R269 Unspecified abnormalities of gait and mobility: Secondary | ICD-10-CM | POA: Diagnosis not present

## 2012-07-23 DIAGNOSIS — R7309 Other abnormal glucose: Secondary | ICD-10-CM | POA: Diagnosis not present

## 2012-07-23 DIAGNOSIS — R1110 Vomiting, unspecified: Secondary | ICD-10-CM | POA: Diagnosis not present

## 2012-07-24 DIAGNOSIS — M159 Polyosteoarthritis, unspecified: Secondary | ICD-10-CM | POA: Diagnosis not present

## 2012-07-24 DIAGNOSIS — K219 Gastro-esophageal reflux disease without esophagitis: Secondary | ICD-10-CM | POA: Diagnosis not present

## 2012-07-24 DIAGNOSIS — G43909 Migraine, unspecified, not intractable, without status migrainosus: Secondary | ICD-10-CM | POA: Diagnosis not present

## 2012-07-24 DIAGNOSIS — I4891 Unspecified atrial fibrillation: Secondary | ICD-10-CM | POA: Diagnosis not present

## 2012-07-24 DIAGNOSIS — Z48812 Encounter for surgical aftercare following surgery on the circulatory system: Secondary | ICD-10-CM | POA: Diagnosis not present

## 2012-07-24 DIAGNOSIS — R269 Unspecified abnormalities of gait and mobility: Secondary | ICD-10-CM | POA: Diagnosis not present

## 2012-07-27 DIAGNOSIS — Z48812 Encounter for surgical aftercare following surgery on the circulatory system: Secondary | ICD-10-CM | POA: Diagnosis not present

## 2012-07-27 DIAGNOSIS — M159 Polyosteoarthritis, unspecified: Secondary | ICD-10-CM | POA: Diagnosis not present

## 2012-07-27 DIAGNOSIS — I4891 Unspecified atrial fibrillation: Secondary | ICD-10-CM | POA: Diagnosis not present

## 2012-07-27 DIAGNOSIS — R269 Unspecified abnormalities of gait and mobility: Secondary | ICD-10-CM | POA: Diagnosis not present

## 2012-07-27 DIAGNOSIS — G43909 Migraine, unspecified, not intractable, without status migrainosus: Secondary | ICD-10-CM | POA: Diagnosis not present

## 2012-07-27 DIAGNOSIS — K219 Gastro-esophageal reflux disease without esophagitis: Secondary | ICD-10-CM | POA: Diagnosis not present

## 2012-07-31 DIAGNOSIS — I4891 Unspecified atrial fibrillation: Secondary | ICD-10-CM | POA: Diagnosis not present

## 2012-07-31 DIAGNOSIS — K219 Gastro-esophageal reflux disease without esophagitis: Secondary | ICD-10-CM | POA: Diagnosis not present

## 2012-07-31 DIAGNOSIS — R269 Unspecified abnormalities of gait and mobility: Secondary | ICD-10-CM | POA: Diagnosis not present

## 2012-07-31 DIAGNOSIS — Z48812 Encounter for surgical aftercare following surgery on the circulatory system: Secondary | ICD-10-CM | POA: Diagnosis not present

## 2012-07-31 DIAGNOSIS — G43909 Migraine, unspecified, not intractable, without status migrainosus: Secondary | ICD-10-CM | POA: Diagnosis not present

## 2012-07-31 DIAGNOSIS — M159 Polyosteoarthritis, unspecified: Secondary | ICD-10-CM | POA: Diagnosis not present

## 2012-08-04 DIAGNOSIS — M159 Polyosteoarthritis, unspecified: Secondary | ICD-10-CM | POA: Diagnosis not present

## 2012-08-04 DIAGNOSIS — G43909 Migraine, unspecified, not intractable, without status migrainosus: Secondary | ICD-10-CM | POA: Diagnosis not present

## 2012-08-04 DIAGNOSIS — K219 Gastro-esophageal reflux disease without esophagitis: Secondary | ICD-10-CM | POA: Diagnosis not present

## 2012-08-04 DIAGNOSIS — Z48812 Encounter for surgical aftercare following surgery on the circulatory system: Secondary | ICD-10-CM | POA: Diagnosis not present

## 2012-08-04 DIAGNOSIS — I4891 Unspecified atrial fibrillation: Secondary | ICD-10-CM | POA: Diagnosis not present

## 2012-08-04 DIAGNOSIS — R269 Unspecified abnormalities of gait and mobility: Secondary | ICD-10-CM | POA: Diagnosis not present

## 2012-08-06 ENCOUNTER — Other Ambulatory Visit: Payer: Self-pay | Admitting: *Deleted

## 2012-08-06 DIAGNOSIS — I712 Thoracic aortic aneurysm, without rupture: Secondary | ICD-10-CM

## 2012-08-10 ENCOUNTER — Ambulatory Visit
Admission: RE | Admit: 2012-08-10 | Discharge: 2012-08-10 | Disposition: A | Discharge status: Home / Self Care | Payer: Medicare Other | Source: Ambulatory Visit | Attending: Cardiothoracic Surgery | Admitting: Cardiothoracic Surgery

## 2012-08-10 ENCOUNTER — Ambulatory Visit (INDEPENDENT_AMBULATORY_CARE_PROVIDER_SITE_OTHER): Payer: Self-pay | Admitting: Physician Assistant

## 2012-08-10 VITALS — BP 118/72 | HR 85 | Resp 20 | Ht 61.0 in | Wt 125.0 lb

## 2012-08-10 DIAGNOSIS — Z09 Encounter for follow-up examination after completed treatment for conditions other than malignant neoplasm: Secondary | ICD-10-CM

## 2012-08-10 DIAGNOSIS — I712 Thoracic aortic aneurysm, without rupture: Secondary | ICD-10-CM

## 2012-08-10 DIAGNOSIS — J90 Pleural effusion, not elsewhere classified: Secondary | ICD-10-CM | POA: Diagnosis not present

## 2012-08-10 DIAGNOSIS — J9819 Other pulmonary collapse: Secondary | ICD-10-CM | POA: Diagnosis not present

## 2012-08-10 NOTE — Patient Instructions (Signed)
Activity: 1.May walk up steps                2.No lifting more than ten pounds for four weeks.                 3.No driving for four weeks.                4.Stop any activity that causes chest pain, shortness of breath, dizziness,   sweating or excessive weakness.                5.Avoid straining.                6.Continue with your breathing exercises daily.  Diet: Diabetic diet and Low fat, Low salt diet  Wound Care: May shower.  Clean wounds with mild soap and water daily. Contact the office at [phone] if any problems arise.   Thoracic Aortic Aneurysm An aneurysm is the enlargement (dilatation), bulging or ballooning out of part of the wall of a vein or artery. An Aortic Aneurysm is a bulging in the largest artery of the body. This artery supplies blood from the heart to the rest of the body. The first part of the aorta is called the thoracic aorta. It leaves the heart, ascends (rises), arches, and descends (goes down) through the chest until it reaches the diaphragm (the muscular partition between the chest and abdomen (belly). The second part of the aorta is then called the abdominal aorta after it has passed the diaphragm and continues down through the abdomen. The abdominal aorta ends where it splits to form the two iliac arteries that go to the legs. Aortic aneurysms can develop anywhere along the length of the aorta. A thoracic aortic aneurysm (TAA) occurs in the first part of the aorta, between the heart and the diaphragm. The major importance of an aneurysm is that it can rupture or tear (dissect), causing death unless diagnosed and treated promptly. CAUSES  Most thoracic aortic aneurysms are related to arteriosclerosis. The arteriosclerosis can weaken the aortic wall. The pressure of the blood being pumped through the aorta causes it to balloon out at the site of weakness. Therefore, elevated blood pressure (hypertension) is associated with aneurysm. Other risk factors include:  Age  over 75.  Tobacco use.  Female sex.  Family history of aneurysm. Additional causes of thoracic aortic aneurysms include:  Genetics (passed by birth).  Injury: After physical trauma to the aorta.  Inflammation of blood vessels.  Hardening of the arteries.  Infection. SYMPTOMS  Many aneurysms do not cause problems. A small, unchanging or slowly changing aneurysm may produce no symptoms until it suddenly ruptures or dissects (separation of the layers of the aortic wall) without warning. It may then cause death. The symptoms (problems) of a developing aneurysm will partly depend on its size and rate of growth. Thoracic aortic aneurysms may cause pain in the:  Chest.  Back.  Sides.  Abdomen. The pain most often has a deep quality as if it is boring into the person. It may cause:  Heart failure.  Heart attack.  Hoarseness.  Cough.  Shortness of breath.  Swallowing problems. DIAGNOSIS  A thoracic aortic aneurysm may be suspected based on your symptoms. It may also be detected by x-ray or CT studies done for unrelated reasons.  Several different imaging studies can be used to confirm a TAA:  An echocardiogram is an ultrasound test to examine the heart. It can also examine the first parts  of the aorta. Sometimes, this test is done by putting you to sleep and inserting a flexible telescope through your mouth into your esophagus, which is next to the aorta; excellent pictures of the aorta can be obtained. This is called a transesophageal echocardiogram (TEE).  CT scanning of the chest is accurate at showing the exact size and shape of the aneurysm.  MRI scanning is accurate, and is used for certain types of TAA.  An aortic angiogram shows the source of the major blood vessels arising from the aorta. It reveals the size and extent of any aneurysm. It can also show a clot clinging to the wall of the aneurysm (mural thrombus). The angiogram may give information about a tear of the  aorta. TREATMENT  Treatment for a thoracic aortic aneurysm depends on:  Location.  Size.  Other factors.  Rate of growth.  Underlying cause. Medical treatment is used for smaller or complicated aneurysms, or those that do not cause symptoms. These include:  Stopping smoking.  Blood pressure control.  Control of cholesterol. Surgical treatment is used for aneurysms that cause symptoms, or for those that are large or growing in size. The surgical technique depends on the location of the aneurysm. HOME CARE INSTRUCTIONS   If you smoke, stop. If you do not, do not start.  Take all medications as prescribed.  Your caregiver will tell you when to have your aneurysm rechecked, either by echocardiogram or CT scan. Be sure to keep this and all follow-up appointments. SEEK MEDICAL CARE IF:   You develop mild pain in your chest, upper back, sides, or abdomen.  You develop cough, hoarseness or trouble swallowing. SEEK IMMEDIATE MEDICAL CARE IF:   You develop severe chest or abdominal pain, or severe pain moving (radiating) to your back.  You suddenly develop cold or blue toes or feet.  You suddenly develop lightheadedness or fainting spells.  You develop trouble breathing. Document Released: 07/22/2005 Document Revised: 10/14/2011 Document Reviewed: 06/10/2007 [facility] Patient Information 2013 [place].

## 2012-08-10 NOTE — Progress Notes (Signed)
[address]            ,[place] [address]          [phone]    CARDIAC SURGERY POSTOPERATIVE VISIT  Patient Name: [name] [name] MRN: [id number] DOB: 1941-01-17  Subjective: [name] [name] is a 72 y.o. female here for routine post operative appointment following a redo sternotomy, resection and grafting of a fusiform ascending thoracic aortic aneurysm. Her only complainst are having to use oxygen and occasional nausea. She denies abdominal pain, emesis, constipation, chest pain or shortness of breath.  Past Medical History  Diagnosis Date  . 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 H/O aortic valve disorder 10/04/2002    Dr. Tharon Aquas Trigt  . Thoracic ascending aortic aneurysm     CTA CHEST 05/11/12  . History of migraine headaches   . Headache   . Migraines   . Heart murmur   . GERD (gastroesophageal reflux disease)   . Arthritis   . Dysrhythmia   . Cardiac asystole, post DCCV requiring use of temp. external pacemaker leads 07/19/2012   Prior to Admission medications   Medication Sig Start Date End Date Taking? Authorizing Provider  aspirin EC 81 MG tablet Take 81 mg by mouth daily.   Yes Historical Provider, MD  budesonide-formoterol (SYMBICORT) 160-4.5 MCG/ACT inhaler Inhale 2 puffs into the lungs 2 (two) times daily. 07/17/12  Yes Treysen Sudbeck Liston Alba, PA  Calcium-Magnesium-Vitamin D (CALCIUM 500 PO) Take 500 mg by mouth 2 (two) times daily.   Yes Historical Provider, MD  Cholecalciferol (VITAMIN D) 2000 UNITS CAPS Take 2,000 Units by mouth 2 (two) times daily.   Yes Historical Provider, MD  CINNAMON PO Take 2,000 mg by mouth daily.    Yes Historical Provider, MD  ferrous sulfate 325 (65 FE) MG tablet Take 1 tablet (325 mg total) by mouth daily with breakfast. For one month then stop 07/17/12  Yes Kylee Umana Liston Alba, PA  fish oil-omega-3 fatty acids 1000 MG capsule Take 2 g by mouth daily.    Yes Historical Provider, MD    Multiple Vitamins-Minerals (OCUVITE PO) Take 1 tablet by mouth daily.    Yes Historical Provider, MD  pantoprazole (PROTONIX) 40 MG tablet Take 40 mg by mouth daily.   Yes Historical Provider, MD  Polyethyl Glycol-Propyl Glycol (SYSTANE OP) Place 1 drop into both eyes daily.   Yes Historical Provider, MD  ranitidine (ZANTAC) 75 MG tablet Take 75 mg by mouth daily as needed. indigestion   Yes Historical Provider, MD  SUMAtriptan (IMITREX) 50 MG tablet Take 50 mg by mouth every 2 (two) hours as needed. For migraines   Yes Historical Provider, MD  traMADol (ULTRAM) 50 MG tablet Take 1 tablet (50 mg total) by mouth every 6 (six) hours as needed for pain. 07/17/12  Yes Trip Cavanagh Liston Alba, PA    Physical Exam:  Filed Vitals:   08/10/12 1228  BP: 118/72  Pulse: 85  Resp: 20    GENERAL: Well-nourished, well-developed, in no acute distress CARDIOVASCULAR: Regular rate and rhythm. No murmurs/rubs/gallops.  RESPIRATORY: Respiratory effort is normal. Lungs clear to auscultation. ABDOMEN: Bowel sounds present. No masses or tenderness. EXTREMITIES:No peripheral edema, pulses intact WOUND: Clean and dry  Imaging Studies: CXR done today showsimprovement in bibasilar aeration with only some minimal streaky subsegmental atelectasis in the left base and tiny effusions visible  on today's exam.    Impression/Plan: Overall, she is surgically stable. She was not placed on a beta blocker post op secondary to labile blood pressure. She has an appointment to see the cardiologist on Wednesday 1/8. I will defer to him whether to start a beta blocker. Regarding her nausea, I have instructed her to stop the Iron supplement. She was also instructed to limit the use of Ultram, but she may use Tylenol etc. She walked three times up and down the hallway on room air. Her oxygen saturation remained 96%. I have instructed her to stop using the oxygen. She may begin driving short distances as long as she is not taking  any pain medicine for pain. Finally, she is to continue with sternal precautions (i.e. No lifting more than 10 pounds) for at least 3-4 weeks. She will be seen on a PRN basis by Dr. Prescott Gum.   Lars Pinks, PA-C 08/10/2012 1:29 PM

## 2012-08-12 DIAGNOSIS — I719 Aortic aneurysm of unspecified site, without rupture: Secondary | ICD-10-CM | POA: Diagnosis not present

## 2012-08-12 DIAGNOSIS — I061 Rheumatic aortic insufficiency: Secondary | ICD-10-CM | POA: Diagnosis not present

## 2012-08-14 ENCOUNTER — Other Ambulatory Visit: Payer: Self-pay

## 2012-08-14 ENCOUNTER — Encounter (HOSPITAL_COMMUNITY): Payer: Self-pay | Admitting: Emergency Medicine

## 2012-08-14 ENCOUNTER — Emergency Department (HOSPITAL_COMMUNITY): Payer: Medicare Other

## 2012-08-14 ENCOUNTER — Emergency Department (HOSPITAL_COMMUNITY)
Admission: EM | Admit: 2012-08-14 | Discharge: 2012-08-14 | Disposition: A | Discharge status: Home / Self Care | Payer: Medicare Other | Attending: Emergency Medicine | Admitting: Emergency Medicine

## 2012-08-14 DIAGNOSIS — N289 Disorder of kidney and ureter, unspecified: Secondary | ICD-10-CM | POA: Diagnosis present

## 2012-08-14 DIAGNOSIS — R0609 Other forms of dyspnea: Secondary | ICD-10-CM | POA: Diagnosis not present

## 2012-08-14 DIAGNOSIS — M129 Arthropathy, unspecified: Secondary | ICD-10-CM | POA: Diagnosis not present

## 2012-08-14 DIAGNOSIS — R011 Cardiac murmur, unspecified: Secondary | ICD-10-CM | POA: Insufficient documentation

## 2012-08-14 DIAGNOSIS — Z9889 Other specified postprocedural states: Secondary | ICD-10-CM | POA: Diagnosis not present

## 2012-08-14 DIAGNOSIS — K219 Gastro-esophageal reflux disease without esophagitis: Secondary | ICD-10-CM | POA: Insufficient documentation

## 2012-08-14 DIAGNOSIS — Z954 Presence of other heart-valve replacement: Secondary | ICD-10-CM | POA: Diagnosis not present

## 2012-08-14 DIAGNOSIS — N390 Urinary tract infection, site not specified: Secondary | ICD-10-CM | POA: Insufficient documentation

## 2012-08-14 DIAGNOSIS — Z7982 Long term (current) use of aspirin: Secondary | ICD-10-CM | POA: Diagnosis not present

## 2012-08-14 DIAGNOSIS — Z8679 Personal history of other diseases of the circulatory system: Secondary | ICD-10-CM

## 2012-08-14 DIAGNOSIS — J9819 Other pulmonary collapse: Secondary | ICD-10-CM | POA: Diagnosis not present

## 2012-08-14 DIAGNOSIS — Z8669 Personal history of other diseases of the nervous system and sense organs: Secondary | ICD-10-CM | POA: Diagnosis not present

## 2012-08-14 DIAGNOSIS — I480 Paroxysmal atrial fibrillation: Secondary | ICD-10-CM

## 2012-08-14 DIAGNOSIS — I4891 Unspecified atrial fibrillation: Secondary | ICD-10-CM | POA: Diagnosis not present

## 2012-08-14 DIAGNOSIS — R002 Palpitations: Secondary | ICD-10-CM | POA: Diagnosis not present

## 2012-08-14 DIAGNOSIS — R0989 Other specified symptoms and signs involving the circulatory and respiratory systems: Secondary | ICD-10-CM | POA: Diagnosis not present

## 2012-08-14 DIAGNOSIS — IMO0001 Reserved for inherently not codable concepts without codable children: Secondary | ICD-10-CM

## 2012-08-14 LAB — COMPREHENSIVE METABOLIC PANEL
AST: 16 U/L (ref 0–37)
BUN: 17 mg/dL (ref 6–23)
CO2: 25 mEq/L (ref 19–32)
Chloride: 105 mEq/L (ref 96–112)
Creatinine, Ser: 1.24 mg/dL — ABNORMAL HIGH (ref 0.50–1.10)
GFR calc non Af Amer: 42 mL/min — ABNORMAL LOW (ref >90)
Total Bilirubin: 0.4 mg/dL (ref 0.3–1.2)

## 2012-08-14 LAB — CBC WITH DIFFERENTIAL/PLATELET
Basophils Absolute: 0.1 10*3/uL (ref 0.0–0.1)
HCT: 31.3 % — ABNORMAL LOW (ref 36.0–46.0)
Hemoglobin: 10.2 g/dL — ABNORMAL LOW (ref 12.0–15.0)
Lymphocytes Relative: 19 % (ref 12–46)
Monocytes Absolute: 0.7 10*3/uL (ref 0.1–1.0)
Monocytes Relative: 11 % (ref 3–12)
Neutro Abs: 3.8 10*3/uL (ref 1.7–7.7)
WBC: 6.1 10*3/uL (ref 4.0–10.5)

## 2012-08-14 LAB — POCT I-STAT TROPONIN I

## 2012-08-14 LAB — URINALYSIS, ROUTINE W REFLEX MICROSCOPIC
Bilirubin Urine: NEGATIVE
Glucose, UA: NEGATIVE mg/dL
Hgb urine dipstick: NEGATIVE
Ketones, ur: NEGATIVE mg/dL
Nitrite: POSITIVE — AB
Protein, ur: NEGATIVE mg/dL
Specific Gravity, Urine: 1.009 (ref 1.005–1.030)
Urobilinogen, UA: 0.2 mg/dL (ref 0.0–1.0)
pH: 6 (ref 5.0–8.0)

## 2012-08-14 LAB — URINE MICROSCOPIC-ADD ON

## 2012-08-14 LAB — PROTIME-INR
INR: 1.07 (ref 0.00–1.49)
Prothrombin Time: 13.8 s (ref 11.6–15.2)

## 2012-08-14 MED ORDER — DEXTROSE 5 % IV SOLN
1.0000 g | Freq: Once | INTRAVENOUS | Status: AC
  Administered 2012-08-14: 1 g via INTRAVENOUS
  Filled 2012-08-14: qty 10

## 2012-08-14 MED ORDER — CEPHALEXIN 500 MG PO CAPS: 500.0000 mg | ORAL_CAPSULE | Freq: Four times a day (QID) | ORAL | Status: DC

## 2012-08-14 NOTE — ED Notes (Signed)
Cardiology at bedside.

## 2012-08-14 NOTE — ED Notes (Signed)
Pt states she felt "thumping" and "fluttering"  in the middle of the night, denies any chest pain. Pt has cardiac hx and was concerned about going into a fib again.

## 2012-08-14 NOTE — H&P (Signed)
Patient ID: [name] [name] MRN: [id number], DOB/AGE: 02-02-1941   Admit date: 08/14/2012   Primary Physician: Ann Held, MD Primary Cardiologist: Dr [name]  HPI: Pleasant 72 y/o female from [place] followed by Dr [name]. 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 She developed an ascending thoracic aneurysm and underwent  and underwent resection and grafting 12/13 by Dr PVT. The Aov placed 26yrs prior was still good. Post op she had problems with PAF and required TEE/CV 12/11/3. This was complicated by asystole and a temporary pacer was placed. She was seen by Dr Sallyanne Kuster who recommended an OP 30 day monitor. She was not discharged on a beta blocker because of SSS. Last night she developed palpitations and DOE. Today she decided to come to the ER but her HR "leveled off" by the time she got here. She is in NSR now.   Problem List: Past Medical History  Diagnosis Date  . 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 H/O aortic valve disorder 10/04/2002    Dr. Tharon Aquas Trigt  . Thoracic ascending aortic aneurysm     CTA CHEST 05/11/12  . History of migraine headaches   . Headache   . Migraines   . Heart murmur   . GERD (gastroesophageal reflux disease)   . Arthritis   . Dysrhythmia   . Cardiac asystole, post DCCV requiring use of temp. external pacemaker leads 07/19/2012    Past Surgical History  Procedure Date  . Aortic valve replacement 10/04/2002     Dr. Tharon Aquas Trigt    #21 pericardial stented valve  . Abdominal hysterectomy   . Tee without cardioversion 06/18/2012    Procedure: TRANSESOPHAGEAL ECHOCARDIOGRAM (TEE);  Surgeon: Sanda Klein, MD;  Location: [facility] ENDOSCOPY;  Service: Cardiovascular;  Laterality: N/A;  right and left heart cath after this procedure  . 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 Tonsillectomy   . Replacement ascending aorta 07/07/2012    Procedure: REPLACEMENT ASCENDING AORTA;  Surgeon: Ivin Poot, MD;  Location: [facility];   Service: Open Heart Surgery;  Laterality: N/A;  CIRCULATORY ARREST  . Tee without cardioversion 07/15/2012    Procedure: TRANSESOPHAGEAL ECHOCARDIOGRAM (TEE);  Surgeon: Pixie Casino, MD;  Location: [facility] ENDOSCOPY;  Service: Cardiovascular;  Laterality: N/A;  . Cardioversion 07/15/2012    Procedure: CARDIOVERSION;  Surgeon: Pixie Casino, MD;  Location: [facility] ENDOSCOPY;  Service: Cardiovascular;  Laterality: N/A;     Allergies:  Allergies  Allergen Reactions  . Other Nausea And Vomiting    Headache also  Also from artificial sweeetners  . Codeine Nausea And Vomiting     Home Medications  See Med rec   No family history on file.   History   Social History  . Marital Status: Married    Spouse Name: N/A    Number of Children: N/A  . Years of Education: N/A   Occupational History  . Not on file.   Social History Main Topics  . Smoking status: Never Smoker   . Smokeless tobacco: Never Used  . Alcohol Use: No  . Drug Use: No  . Sexually Active: Not Currently   Other Topics Concern  . Not on file   Social History Narrative  . No narrative on file     Review of Systems: General: negative for chills, fever, night sweats or weight changes.  Cardiovascular: negative for chest pain, dyspnea on exertion,  edema, orthopnea, palpitations, paroxysmal nocturnal dyspnea or shortness of breath Dermatological: negative for rash Respiratory: negative for cough or wheezing Urologic: negative for hematuria Abdominal: negative for nausea, vomiting, diarrhea, bright red blood per rectum, melena, or hematemesis Neurologic: negative for visual changes, syncope, or dizziness All other systems reviewed and are otherwise negative except as noted above.  Physical Exam: Blood pressure 121/56, pulse 108, temperature 98.2 F (36.8 C), temperature source Oral, resp. rate 24, SpO2 94.00%.  General appearance: alert, cooperative and no distress Neck: no carotid bruit and no JVD Lungs:  clear to auscultation bilaterally Heart: regular rate and rhythm Abdomen: soft, non-tender; bowel sounds normal; no masses,  no organomegaly Extremities: extremities normal, atraumatic, no cyanosis or edema Pulses: 2+ and symmetric Skin: Skin color, texture, turgor normal. No rashes or lesions Neurologic: Grossly normal    Labs:   Results for orders placed during the hospital encounter of 08/14/12 (from the past 24 hour(s))  CBC WITH DIFFERENTIAL     Status: Abnormal   Collection Time   08/14/12  2:43 PM      Component Value Range   WBC 6.1  4.0 - 10.5 K/uL   RBC 3.31 (*) 3.87 - 5.11 MIL/uL   Hemoglobin 10.2 (*) 12.0 - 15.0 g/dL   HCT 31.3 (*) 36.0 - 46.0 %   MCV 94.6  78.0 - 100.0 fL   MCH 30.8  26.0 - 34.0 pg   MCHC 32.6  30.0 - 36.0 g/dL   RDW 13.9  11.5 - 15.5 %   Platelets 284  150 - 400 K/uL   Neutrophils Relative 63  43 - 77 %   Neutro Abs 3.8  1.7 - 7.7 K/uL   Lymphocytes Relative 19  12 - 46 %   Lymphs Abs 1.1  0.7 - 4.0 K/uL   Monocytes Relative 11  3 - 12 %   Monocytes Absolute 0.7  0.1 - 1.0 K/uL   Eosinophils Relative 6 (*) 0 - 5 %   Eosinophils Absolute 0.4  0.0 - 0.7 K/uL   Basophils Relative 1  0 - 1 %   Basophils Absolute 0.1  0.0 - 0.1 K/uL  COMPREHENSIVE METABOLIC PANEL     Status: Abnormal   Collection Time   08/14/12  2:43 PM      Component Value Range   Sodium 141  135 - 145 mEq/L   Potassium 4.2  3.5 - 5.1 mEq/L   Chloride 105  96 - 112 mEq/L   CO2 25  19 - 32 mEq/L   Glucose, Bld 93  70 - 99 mg/dL   BUN 17  6 - 23 mg/dL   Creatinine, Ser 1.24 (*) 0.50 - 1.10 mg/dL   Calcium 9.6  8.4 - 10.5 mg/dL   Total Protein 7.0  6.0 - 8.3 g/dL   Albumin 3.1 (*) 3.5 - 5.2 g/dL   AST 16  0 - 37 U/L   ALT 9  0 - 35 U/L   Alkaline Phosphatase 67  39 - 117 U/L   Total Bilirubin 0.4  0.3 - 1.2 mg/dL   GFR calc non Af Amer 42 (*) >90 mL/min   GFR calc Af Amer 49 (*) >90 mL/min  URINALYSIS, ROUTINE W REFLEX MICROSCOPIC     Status: Abnormal   Collection Time     08/14/12  3:26 PM      Component Value Range   Color, Urine YELLOW  YELLOW   APPearance HAZY (*) CLEAR   Specific Gravity,  Urine 1.009  1.005 - 1.030   pH 6.0  5.0 - 8.0   Glucose, UA NEGATIVE  NEGATIVE mg/dL   Hgb urine dipstick NEGATIVE  NEGATIVE   Bilirubin Urine NEGATIVE  NEGATIVE   Ketones, ur NEGATIVE  NEGATIVE mg/dL   Protein, ur NEGATIVE  NEGATIVE mg/dL   Urobilinogen, UA 0.2  0.0 - 1.0 mg/dL   Nitrite POSITIVE (*) NEGATIVE   Leukocytes, UA SMALL (*) NEGATIVE  URINE MICROSCOPIC-ADD ON     Status: Abnormal   Collection Time   08/14/12  3:26 PM      Component Value Range   Squamous Epithelial / LPF FEW (*) RARE   WBC, UA 21-50  <3 WBC/hpf   Bacteria, UA MANY (*) RARE  PROTIME-INR     Status: Normal   Collection Time   08/14/12  3:33 PM      Component Value Range   Prothrombin Time 13.8  11.6 - 15.2 seconds   INR 1.07  0.00 - 1.49  POCT I-STAT TROPONIN I     Status: Normal   Collection Time   08/14/12  4:02 PM      Component Value Range   Troponin i, poc 0.03  0.00 - 0.08 ng/mL   Comment 3              Radiology/Studies: Dg Chest 2 View  08/14/2012  *RADIOLOGY REPORT*  Clinical Data: Irregular heart rate and fluttering  CHEST - 2 VIEW  Comparison: 08/10/2012  Findings: Previous median sternotomy and CABG procedure.  No pleural effusion identified.  Increased lung volumes with flattening of the diaphragms.  There are coarsened interstitial markings suggestive of COPD/emphysema.  IMPRESSION:  1.  Improvement and bibasilar atelectasis. 2.  Increased lung volumes and coarsened interstitial markings.   Original Report Authenticated By: Kerby Moors, M.D.    Dg Chest 2 View  08/10/2012  *RADIOLOGY REPORT*  Clinical Data: Status post aneurysm surgery on 07/07/2012.  CHEST - 2 VIEW  Comparison: 07/19/2012  Findings: Lungs are hyperexpanded.  No pulmonary edema.  Right lung is clear.  There is some streaky subsegmental atelectasis at the left base.  Probable extremely tiny posterior  pleural effusion on the lateral view. The cardiopericardial silhouette is within normal limits for size.  The patient is status post AVR. Imaged bony structures of the thorax are intact.  IMPRESSION: Improvement in bibasilar aeration with only some minimal streaky subsegmental atelectasis in the left base and tiny effusions visible on today's exam.   Original Report Authenticated By: Misty Stanley, M.D.    Dg Chest Port 1 View  07/19/2012  *RADIOLOGY REPORT*  Clinical Data: Short of breath with chest pain  PORTABLE CHEST - 1 VIEW  Comparison: None.  Findings: Sternotomy wires overlie normal cardiac silhouette. Interval removal of left PICC line.  Bilateral pleural effusions and left basilar atelectasis unchanged from prior.  No pneumothorax.  No focal infiltrate.  IMPRESSION:  No change in bilateral small effusions and left lower lobe atelectasis.   Original Report Authenticated By: Suzy Bouchard, M.D.    Dg Chest Port 1 View  07/16/2012  *RADIOLOGY REPORT*  Clinical Data: Chest pain, history of thoracic aortic aneurysm  PORTABLE CHEST - 1 VIEW  Comparison: Portable chest x-ray of 07/15/2012  Findings: There is more airspace disease most consistent with mild edema with effusions. Consolidation at the left lung base is stable.  Cardiomegaly is stable.  The left central venous line is unchanged in position overlying the expected SVC - RA  junction.  IMPRESSION: Worsening of probable edema pattern with effusions.  No change in left basilar consolidation.   Original Report Authenticated By: Ivar Drape, M.D.     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   Henri Medal,  PA-C 08/14/2012, 4:54 PM   Patient seen and examined. Agree with assessment and plan. Pt admits to being under increased stress with the death of her relative 2 nights ago. Palpitations have now resolved. Pt had seen Dr. [name] 2 days ago in office. She has documented normal coronary arteries by cath in Nov 2013. Currently in sinus rhythm at 88 - 90. Will send home with metoprolol tartrate 25 mg bid and schedule for outpatient 30 day monitor.  Troy Sine, MD, [facility] 08/14/2012 5:42 PM

## 2012-08-14 NOTE — ED Provider Notes (Signed)
This chart was scribed for Ezequiel Essex, MD by Jenne Campus, ED Scribe. This patient was seen in room A03C/A03C and the patient's care was started at 2:10 PM.  [name] [name] is a 72 y.o. female who presents to the Emergency Department complaining of approximately 12 hours sudden onset, rapidly resolved, constant palpitations described as pounding heart beat with associated mild SOB that woke her up out of sleep last night. She reports that sitting up improved her symptoms mildly. She is not coumadin but had an aortic anneursym repair done on 07/07/12. She has a h/o aortic valve replacement (Pig valve) but denies that cardiologist involved the valve in the aneurysm repair. She denies CP, SOB and nausea as associated symptoms. She states that she went into A. Fib after her heart valve was replaced but denies having symptoms at that time. Pt was seen by PA who asked me to assess.  Dr. [name] is Cardiologist  PE CARDIOLOGY: Regular rate and rhythm  PULMONary: lungs are clear SKIN: Well-healed midline scar  2:14 PM-Discussed treatment plan which includes evaluation by Dr. [name] with pt at bedside and pt agreed to plan.  Palpitations and shortness of breath this morning that have since resolved. Normal sinus on EKG here. New ST depressions laterally. No chest pain or shortness of breath. Admitted one month ago for thoracic aneurysm repair and had atrial fibrillation in the hospital but not sent home on rate control.  I personally performed the services described in this documentation, which was scribed in my presence. The recorded information has been reviewed and is accurate.   Ezequiel Essex, MD 08/14/12 [phone]

## 2012-08-14 NOTE — ED Notes (Signed)
[name] at bedside   

## 2012-08-14 NOTE — ED Provider Notes (Signed)
Received report at end of shift from [facility], [place].  Pt with new onset afib.  Will be evaluated by [facility] who will determine treatment.  Pt also has UTI.  Rocephin given here, will d/c with Keflex.    5:34 PM Pt was discharged by Dr. Wyvonnia Dusky.  I was not directly involved in this pt care.    BP 110/74  Pulse 95  Temp 98.2 F (36.8 C) (Oral)  Resp 21  SpO2 95%    Domenic Moras, PA-C 08/14/12 1735

## 2012-08-14 NOTE — ED Notes (Signed)
Woke during night had problem walking and her heart was pounding laid back down  And felt  A littlw better sitting up  Still not right   Aortic ann repain 07/07/12

## 2012-08-14 NOTE — ED Provider Notes (Signed)
History     CSN: [id number]  Arrival date & time 08/14/12  1255   First MD Initiated Contact with Patient 08/14/12 1344      No chief complaint on file.   (Consider location/radiation/quality/duration/timing/severity/associated sxs/prior treatment) HPI [name] [name] is a 72 year old female with a past medical history significant for aortic valve replacement and recent thoracic aortic aneurysm repair.  She is followed by Dr. Alvester Chou at [facility].  Patient states that last night she awoke to use the bathroom and was feeling a little bit dizzy had some difficulty walking.  When she laid back down she noticed that her heart was racing and skipping and pounding in her chest.  She had intermittent palpitations.  Lasted all through the night and frequently woke her from sleep.  This morning they continued until she called her son to bring her here to the emergency department for evaluation. patient states that during the ride her racing and skipping heart seemed to calm down.  Patient has a history of atrial fibrillation after her surgery but no prior history.  The patient was afraid she was going back into atrial fibrillation. The patient denies any symptoms of infection including upper respiratory symptoms, urinary symptoms, nausea vomiting or diarrhea.  The patient denies any chest pain, shortness of breath, presyncope. Past Medical History  Diagnosis Date  . 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 H/O aortic valve disorder 10/04/2002    Dr. Tharon Aquas Trigt  . Thoracic ascending aortic aneurysm     CTA CHEST 05/11/12  . History of migraine headaches   . Headache   . Migraines   . Heart murmur   . GERD (gastroesophageal reflux disease)   . Arthritis   . Dysrhythmia   . Cardiac asystole, post DCCV requiring use of temp. external pacemaker leads 07/19/2012    Past Surgical History  Procedure Date  . Aortic valve replacement 10/04/2002     Dr. Tharon Aquas  Trigt    #21 pericardial stented valve  . Abdominal hysterectomy   . Tee without cardioversion 06/18/2012    Procedure: TRANSESOPHAGEAL ECHOCARDIOGRAM (TEE);  Surgeon: Sanda Klein, MD;  Location: [facility] ENDOSCOPY;  Service: Cardiovascular;  Laterality: N/A;  right and left heart cath after this procedure  . 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 Tonsillectomy   . Replacement ascending aorta 07/07/2012    Procedure: REPLACEMENT ASCENDING AORTA;  Surgeon: Ivin Poot, MD;  Location: [place];  Service: Open Heart Surgery;  Laterality: N/A;  CIRCULATORY ARREST  . Tee without cardioversion 07/15/2012    Procedure: TRANSESOPHAGEAL ECHOCARDIOGRAM (TEE);  Surgeon: Pixie Casino, MD;  Location: [facility] ENDOSCOPY;  Service: Cardiovascular;  Laterality: N/A;  . Cardioversion 07/15/2012    Procedure: CARDIOVERSION;  Surgeon: Pixie Casino, MD;  Location: [facility] ENDOSCOPY;  Service: Cardiovascular;  Laterality: N/A;    No family history on file.  History  Substance Use Topics  . Smoking status: Never Smoker   . Smokeless tobacco: Never Used  . Alcohol Use: No    OB History    Grav Para Term Preterm Abortions TAB SAB Ect Mult Living                  Review of Systems Ten systems reviewed and are negative for acute change, except as noted in the HPI.   Allergies  Other and Codeine  Home Medications   Current Outpatient Rx  Name  Route  Sig  Dispense  Refill  . ASPIRIN EC 81 MG PO TBEC   Oral   Take 81 mg by mouth daily.         . BUDESONIDE-FORMOTEROL FUMARATE 160-4.5 MCG/ACT IN AERO   Inhalation   Inhale 2 puffs into the lungs 2 (two) times daily.   1 Inhaler   0   . CALCIUM 500 PO   Oral   Take 500 mg by mouth 2 (two) times daily.         [name] VITAMIN D 2000 UNITS PO CAPS   Oral   Take 2,000 Units by mouth 2 (two) times daily.         [name] CINNAMON PO   Oral   Take 2,000 mg by mouth daily.          [name] FERROUS SULFATE 325 (65 FE) MG PO TABS   Oral   Take  1 tablet (325 mg total) by mouth daily with breakfast. For one month then stop         . OMEGA-3 FATTY ACIDS 1000 MG PO CAPS   Oral   Take 2 g by mouth daily.          . OCUVITE PO   Oral   Take 1 tablet by mouth daily.          [name] PANTOPRAZOLE SODIUM 40 MG PO TBEC   Oral   Take 40 mg by mouth daily.         Carren Rang OP   Both Eyes   Place 1 drop into both eyes daily.         [name] RANITIDINE HCL 75 MG PO TABS   Oral   Take 75 mg by mouth daily as needed. indigestion         . SUMATRIPTAN SUCCINATE 50 MG PO TABS   Oral   Take 50 mg by mouth every 2 (two) hours as needed. For migraines         . TRAMADOL HCL 50 MG PO TABS   Oral   Take 1 tablet (50 mg total) by mouth every 6 (six) hours as needed for pain.   30 tablet   0     BP 121/56  Pulse 108  Temp 98.2 F (36.8 C) (Oral)  Resp 24  SpO2 94%  Physical Exam Physical Exam  Nursing note and vitals reviewed. Constitutional: She is oriented to person, place, and time. She appears well-developed and well-nourished. No distress.  HENT:  Head: Normocephalic and atraumatic.  Eyes: Conjunctivae normal and EOM are normal. Pupils are equal, round, and reactive to light. No scleral icterus.  Neck: Normal range of motion.  Cardiovascular: Normal rate, regular rhythm and normal heart sounds.  Exam reveals no gallop and no friction rub.   No murmur heard. Pulmonary/Chest: Effort normal and breath sounds normal. No respiratory distress.  Abdominal: Soft. Bowel sounds are normal. She exhibits no distension and no mass. There is no tenderness. There is no guarding.  Neurological: She is alert and oriented to person, place, and time.  Skin: Skin is warm and dry. She is not diaphoretic.    ED Course  Procedures (including critical care time)   Labs Reviewed  CBC WITH DIFFERENTIAL  COMPREHENSIVE METABOLIC PANEL  URINALYSIS, ROUTINE W REFLEX MICROSCOPIC   Dg Chest 2 View  08/14/2012  *RADIOLOGY REPORT*   Clinical Data: Irregular heart rate and fluttering  CHEST - 2 VIEW  Comparison: 08/10/2012  Findings: Previous median sternotomy and CABG procedure.  No pleural effusion identified.  Increased lung volumes with flattening of the diaphragms.  There are coarsened interstitial markings suggestive of COPD/emphysema.  IMPRESSION:  1.  Improvement and bibasilar atelectasis. 2.  Increased lung volumes and coarsened interstitial markings.   Original Report Authenticated By: Kerby Moors, M.D.     Date: 08/14/2012  Rate: 102  Rhythm: sinus tachycardia  QRS Axis: normal  Intervals: normal  ST/T Wave abnormalities: flipped Twaves  V5/V6 flattened V4  Conduction Disutrbances:none  Narrative Interpretation:   Old EKG Reviewed: changes noted     No diagnosis found.    MDM  2:12 PM BP 121/56  Pulse 108  Temp 98.2 F (36.8 C) (Oral)  Resp 24  SpO2 94%  Patient with recent cardiac history.  Her current ECG does show some changes in the lateral leads. Obtaining labs to r/o infection or ACS. Patient is resting comfortably. i will contact her cardiology group.   4:05 PM i have ordered repeat EKG.  Seen in shared visit with Dr. Wyvonnia Dusky. He has consulted with Dr. Tamala Julian who will come see the patient here in the ED.  Patient with UTI will treat heere with IV rocephin. Patient seen and dispositioned by Cardiology. Please see note   Margarita Mail, PA-C 08/15/12 1339

## 2012-08-15 NOTE — ED Provider Notes (Signed)
Medical screening examination/treatment/procedure(s) were conducted as a shared visit with non-physician practitioner(s) and myself.  I personally evaluated the patient during the encounter  See my additional note.  Ezequiel Essex, MD 08/15/12 1827

## 2012-08-16 NOTE — ED Provider Notes (Signed)
Medical screening examination/treatment/procedure(s) were performed by non-physician practitioner and as supervising physician I was immediately available for consultation/collaboration.   Mirna Mires, MD 08/16/12 [phone]

## 2012-08-17 DIAGNOSIS — M159 Polyosteoarthritis, unspecified: Secondary | ICD-10-CM | POA: Diagnosis not present

## 2012-08-17 DIAGNOSIS — R269 Unspecified abnormalities of gait and mobility: Secondary | ICD-10-CM | POA: Diagnosis not present

## 2012-08-17 DIAGNOSIS — I4891 Unspecified atrial fibrillation: Secondary | ICD-10-CM | POA: Diagnosis not present

## 2012-08-17 DIAGNOSIS — Z48812 Encounter for surgical aftercare following surgery on the circulatory system: Secondary | ICD-10-CM | POA: Diagnosis not present

## 2012-08-17 DIAGNOSIS — G43909 Migraine, unspecified, not intractable, without status migrainosus: Secondary | ICD-10-CM | POA: Diagnosis not present

## 2012-08-17 DIAGNOSIS — K219 Gastro-esophageal reflux disease without esophagitis: Secondary | ICD-10-CM | POA: Diagnosis not present

## 2012-08-17 DIAGNOSIS — R002 Palpitations: Secondary | ICD-10-CM | POA: Diagnosis not present

## 2012-08-17 LAB — URINE CULTURE

## 2012-08-18 ENCOUNTER — Telehealth (HOSPITAL_COMMUNITY): Payer: Self-pay | Admitting: Emergency Medicine

## 2012-08-18 DIAGNOSIS — Z9889 Other specified postprocedural states: Secondary | ICD-10-CM | POA: Diagnosis not present

## 2012-08-18 DIAGNOSIS — Z5189 Encounter for other specified aftercare: Secondary | ICD-10-CM | POA: Diagnosis not present

## 2012-08-18 DIAGNOSIS — Z87898 Personal history of other specified conditions: Secondary | ICD-10-CM | POA: Diagnosis not present

## 2012-08-18 NOTE — ED Notes (Signed)
Results received from [facility]. (+) URNC -> >/= 100,000 colonies, Klebsiella Oxytoca.   Rx given in ED for Keflex -> sensitive to the same.  Chart appended per protocol.

## 2012-08-21 DIAGNOSIS — Z87898 Personal history of other specified conditions: Secondary | ICD-10-CM | POA: Diagnosis not present

## 2012-08-21 DIAGNOSIS — Z9889 Other specified postprocedural states: Secondary | ICD-10-CM | POA: Diagnosis not present

## 2012-08-21 DIAGNOSIS — Z5189 Encounter for other specified aftercare: Secondary | ICD-10-CM | POA: Diagnosis not present

## 2012-08-24 DIAGNOSIS — G43909 Migraine, unspecified, not intractable, without status migrainosus: Secondary | ICD-10-CM | POA: Diagnosis not present

## 2012-08-24 DIAGNOSIS — M159 Polyosteoarthritis, unspecified: Secondary | ICD-10-CM | POA: Diagnosis not present

## 2012-08-24 DIAGNOSIS — R269 Unspecified abnormalities of gait and mobility: Secondary | ICD-10-CM | POA: Diagnosis not present

## 2012-08-24 DIAGNOSIS — Z5189 Encounter for other specified aftercare: Secondary | ICD-10-CM | POA: Diagnosis not present

## 2012-08-24 DIAGNOSIS — K219 Gastro-esophageal reflux disease without esophagitis: Secondary | ICD-10-CM | POA: Diagnosis not present

## 2012-08-24 DIAGNOSIS — Z87898 Personal history of other specified conditions: Secondary | ICD-10-CM | POA: Diagnosis not present

## 2012-08-24 DIAGNOSIS — Z48812 Encounter for surgical aftercare following surgery on the circulatory system: Secondary | ICD-10-CM | POA: Diagnosis not present

## 2012-08-24 DIAGNOSIS — I4891 Unspecified atrial fibrillation: Secondary | ICD-10-CM | POA: Diagnosis not present

## 2012-08-24 DIAGNOSIS — Z9889 Other specified postprocedural states: Secondary | ICD-10-CM | POA: Diagnosis not present

## 2012-08-26 DIAGNOSIS — Z9889 Other specified postprocedural states: Secondary | ICD-10-CM | POA: Diagnosis not present

## 2012-08-26 DIAGNOSIS — Z5189 Encounter for other specified aftercare: Secondary | ICD-10-CM | POA: Diagnosis not present

## 2012-08-26 DIAGNOSIS — Z87898 Personal history of other specified conditions: Secondary | ICD-10-CM | POA: Diagnosis not present

## 2012-08-28 DIAGNOSIS — Z9889 Other specified postprocedural states: Secondary | ICD-10-CM | POA: Diagnosis not present

## 2012-08-28 DIAGNOSIS — N3000 Acute cystitis without hematuria: Secondary | ICD-10-CM | POA: Diagnosis not present

## 2012-08-28 DIAGNOSIS — Z5189 Encounter for other specified aftercare: Secondary | ICD-10-CM | POA: Diagnosis not present

## 2012-08-28 DIAGNOSIS — Z87898 Personal history of other specified conditions: Secondary | ICD-10-CM | POA: Diagnosis not present

## 2012-08-31 DIAGNOSIS — Z5189 Encounter for other specified aftercare: Secondary | ICD-10-CM | POA: Diagnosis not present

## 2012-08-31 DIAGNOSIS — B589 Toxoplasmosis, unspecified: Secondary | ICD-10-CM | POA: Diagnosis not present

## 2012-08-31 DIAGNOSIS — Z9889 Other specified postprocedural states: Secondary | ICD-10-CM | POA: Diagnosis not present

## 2012-08-31 DIAGNOSIS — Z87898 Personal history of other specified conditions: Secondary | ICD-10-CM | POA: Diagnosis not present

## 2012-09-03 DIAGNOSIS — Z87898 Personal history of other specified conditions: Secondary | ICD-10-CM | POA: Diagnosis not present

## 2012-09-03 DIAGNOSIS — Z9889 Other specified postprocedural states: Secondary | ICD-10-CM | POA: Diagnosis not present

## 2012-09-03 DIAGNOSIS — Z5189 Encounter for other specified aftercare: Secondary | ICD-10-CM | POA: Diagnosis not present

## 2012-09-07 DIAGNOSIS — Z9889 Other specified postprocedural states: Secondary | ICD-10-CM | POA: Diagnosis not present

## 2012-09-07 DIAGNOSIS — Z5189 Encounter for other specified aftercare: Secondary | ICD-10-CM | POA: Diagnosis not present

## 2012-09-07 DIAGNOSIS — Z87898 Personal history of other specified conditions: Secondary | ICD-10-CM | POA: Diagnosis not present

## 2012-09-09 DIAGNOSIS — R05 Cough: Secondary | ICD-10-CM | POA: Diagnosis not present

## 2012-09-09 DIAGNOSIS — J0100 Acute maxillary sinusitis, unspecified: Secondary | ICD-10-CM | POA: Diagnosis not present

## 2012-09-11 DIAGNOSIS — Z87898 Personal history of other specified conditions: Secondary | ICD-10-CM | POA: Diagnosis not present

## 2012-09-11 DIAGNOSIS — Z9889 Other specified postprocedural states: Secondary | ICD-10-CM | POA: Diagnosis not present

## 2012-09-11 DIAGNOSIS — Z5189 Encounter for other specified aftercare: Secondary | ICD-10-CM | POA: Diagnosis not present

## 2012-09-14 DIAGNOSIS — Z9889 Other specified postprocedural states: Secondary | ICD-10-CM | POA: Diagnosis not present

## 2012-09-14 DIAGNOSIS — Z87898 Personal history of other specified conditions: Secondary | ICD-10-CM | POA: Diagnosis not present

## 2012-09-14 DIAGNOSIS — Z5189 Encounter for other specified aftercare: Secondary | ICD-10-CM | POA: Diagnosis not present

## 2012-09-15 DIAGNOSIS — I4891 Unspecified atrial fibrillation: Secondary | ICD-10-CM | POA: Diagnosis not present

## 2012-09-15 DIAGNOSIS — R002 Palpitations: Secondary | ICD-10-CM | POA: Diagnosis not present

## 2012-09-16 DIAGNOSIS — Z5189 Encounter for other specified aftercare: Secondary | ICD-10-CM | POA: Diagnosis not present

## 2012-09-16 DIAGNOSIS — Z87898 Personal history of other specified conditions: Secondary | ICD-10-CM | POA: Diagnosis not present

## 2012-09-16 DIAGNOSIS — Z9889 Other specified postprocedural states: Secondary | ICD-10-CM | POA: Diagnosis not present

## 2012-09-21 DIAGNOSIS — Z5189 Encounter for other specified aftercare: Secondary | ICD-10-CM | POA: Diagnosis not present

## 2012-09-21 DIAGNOSIS — Z87898 Personal history of other specified conditions: Secondary | ICD-10-CM | POA: Diagnosis not present

## 2012-09-21 DIAGNOSIS — Z9889 Other specified postprocedural states: Secondary | ICD-10-CM | POA: Diagnosis not present

## 2012-09-25 DIAGNOSIS — Z9889 Other specified postprocedural states: Secondary | ICD-10-CM | POA: Diagnosis not present

## 2012-09-25 DIAGNOSIS — Z5189 Encounter for other specified aftercare: Secondary | ICD-10-CM | POA: Diagnosis not present

## 2012-09-25 DIAGNOSIS — Z87898 Personal history of other specified conditions: Secondary | ICD-10-CM | POA: Diagnosis not present

## 2012-09-28 DIAGNOSIS — R1013 Epigastric pain: Secondary | ICD-10-CM | POA: Diagnosis not present

## 2012-09-28 DIAGNOSIS — Z9889 Other specified postprocedural states: Secondary | ICD-10-CM | POA: Diagnosis not present

## 2012-09-28 DIAGNOSIS — Z87898 Personal history of other specified conditions: Secondary | ICD-10-CM | POA: Diagnosis not present

## 2012-09-30 DIAGNOSIS — Z9889 Other specified postprocedural states: Secondary | ICD-10-CM | POA: Diagnosis not present

## 2012-09-30 DIAGNOSIS — Z87898 Personal history of other specified conditions: Secondary | ICD-10-CM | POA: Diagnosis not present

## 2012-09-30 DIAGNOSIS — Z5189 Encounter for other specified aftercare: Secondary | ICD-10-CM | POA: Diagnosis not present

## 2012-10-02 DIAGNOSIS — Z87898 Personal history of other specified conditions: Secondary | ICD-10-CM | POA: Diagnosis not present

## 2012-10-02 DIAGNOSIS — Z9889 Other specified postprocedural states: Secondary | ICD-10-CM | POA: Diagnosis not present

## 2012-10-02 DIAGNOSIS — Z5189 Encounter for other specified aftercare: Secondary | ICD-10-CM | POA: Diagnosis not present

## 2012-10-07 DIAGNOSIS — Z5189 Encounter for other specified aftercare: Secondary | ICD-10-CM | POA: Diagnosis not present

## 2012-10-07 DIAGNOSIS — Z9889 Other specified postprocedural states: Secondary | ICD-10-CM | POA: Diagnosis not present

## 2012-10-07 DIAGNOSIS — Z87898 Personal history of other specified conditions: Secondary | ICD-10-CM | POA: Diagnosis not present

## 2012-10-12 DIAGNOSIS — Z9889 Other specified postprocedural states: Secondary | ICD-10-CM | POA: Diagnosis not present

## 2012-10-12 DIAGNOSIS — Z5189 Encounter for other specified aftercare: Secondary | ICD-10-CM | POA: Diagnosis not present

## 2012-10-12 DIAGNOSIS — Z87898 Personal history of other specified conditions: Secondary | ICD-10-CM | POA: Diagnosis not present

## 2012-10-15 DIAGNOSIS — Z87898 Personal history of other specified conditions: Secondary | ICD-10-CM | POA: Diagnosis not present

## 2012-10-15 DIAGNOSIS — Z5189 Encounter for other specified aftercare: Secondary | ICD-10-CM | POA: Diagnosis not present

## 2012-10-15 DIAGNOSIS — Z9889 Other specified postprocedural states: Secondary | ICD-10-CM | POA: Diagnosis not present

## 2012-10-19 DIAGNOSIS — Z5189 Encounter for other specified aftercare: Secondary | ICD-10-CM | POA: Diagnosis not present

## 2012-10-19 DIAGNOSIS — Z9889 Other specified postprocedural states: Secondary | ICD-10-CM | POA: Diagnosis not present

## 2012-10-19 DIAGNOSIS — Z87898 Personal history of other specified conditions: Secondary | ICD-10-CM | POA: Diagnosis not present

## 2012-10-21 DIAGNOSIS — Z5189 Encounter for other specified aftercare: Secondary | ICD-10-CM | POA: Diagnosis not present

## 2012-10-21 DIAGNOSIS — Z9889 Other specified postprocedural states: Secondary | ICD-10-CM | POA: Diagnosis not present

## 2012-10-21 DIAGNOSIS — Z87898 Personal history of other specified conditions: Secondary | ICD-10-CM | POA: Diagnosis not present

## 2012-10-23 DIAGNOSIS — Z5189 Encounter for other specified aftercare: Secondary | ICD-10-CM | POA: Diagnosis not present

## 2012-10-23 DIAGNOSIS — Z9889 Other specified postprocedural states: Secondary | ICD-10-CM | POA: Diagnosis not present

## 2012-10-23 DIAGNOSIS — Z87898 Personal history of other specified conditions: Secondary | ICD-10-CM | POA: Diagnosis not present

## 2012-10-26 DIAGNOSIS — Z5189 Encounter for other specified aftercare: Secondary | ICD-10-CM | POA: Diagnosis not present

## 2012-10-26 DIAGNOSIS — Z87898 Personal history of other specified conditions: Secondary | ICD-10-CM | POA: Diagnosis not present

## 2012-10-26 DIAGNOSIS — Z9889 Other specified postprocedural states: Secondary | ICD-10-CM | POA: Diagnosis not present

## 2012-10-28 DIAGNOSIS — Z9889 Other specified postprocedural states: Secondary | ICD-10-CM | POA: Diagnosis not present

## 2012-10-28 DIAGNOSIS — Z87898 Personal history of other specified conditions: Secondary | ICD-10-CM | POA: Diagnosis not present

## 2012-10-28 DIAGNOSIS — Z5189 Encounter for other specified aftercare: Secondary | ICD-10-CM | POA: Diagnosis not present

## 2012-10-30 DIAGNOSIS — Z87898 Personal history of other specified conditions: Secondary | ICD-10-CM | POA: Diagnosis not present

## 2012-10-30 DIAGNOSIS — Z9889 Other specified postprocedural states: Secondary | ICD-10-CM | POA: Diagnosis not present

## 2012-10-30 DIAGNOSIS — Z5189 Encounter for other specified aftercare: Secondary | ICD-10-CM | POA: Diagnosis not present

## 2012-11-02 DIAGNOSIS — Z9889 Other specified postprocedural states: Secondary | ICD-10-CM | POA: Diagnosis not present

## 2012-11-02 DIAGNOSIS — Z5189 Encounter for other specified aftercare: Secondary | ICD-10-CM | POA: Diagnosis not present

## 2012-11-02 DIAGNOSIS — Z87898 Personal history of other specified conditions: Secondary | ICD-10-CM | POA: Diagnosis not present

## 2012-11-09 DIAGNOSIS — Z87898 Personal history of other specified conditions: Secondary | ICD-10-CM | POA: Diagnosis not present

## 2012-11-09 DIAGNOSIS — Z5189 Encounter for other specified aftercare: Secondary | ICD-10-CM | POA: Diagnosis not present

## 2012-11-09 DIAGNOSIS — Z9889 Other specified postprocedural states: Secondary | ICD-10-CM | POA: Diagnosis not present

## 2012-11-11 DIAGNOSIS — Z5189 Encounter for other specified aftercare: Secondary | ICD-10-CM | POA: Diagnosis not present

## 2012-11-11 DIAGNOSIS — Z87898 Personal history of other specified conditions: Secondary | ICD-10-CM | POA: Diagnosis not present

## 2012-11-11 DIAGNOSIS — Z9889 Other specified postprocedural states: Secondary | ICD-10-CM | POA: Diagnosis not present

## 2012-11-12 ENCOUNTER — Other Ambulatory Visit (HOSPITAL_COMMUNITY): Payer: Self-pay | Admitting: Cardiovascular Disease

## 2012-11-12 DIAGNOSIS — I359 Nonrheumatic aortic valve disorder, unspecified: Secondary | ICD-10-CM

## 2012-11-13 DIAGNOSIS — Z5189 Encounter for other specified aftercare: Secondary | ICD-10-CM | POA: Diagnosis not present

## 2012-11-13 DIAGNOSIS — Z9889 Other specified postprocedural states: Secondary | ICD-10-CM | POA: Diagnosis not present

## 2012-11-13 DIAGNOSIS — Z87898 Personal history of other specified conditions: Secondary | ICD-10-CM | POA: Diagnosis not present

## 2012-11-16 DIAGNOSIS — Z9889 Other specified postprocedural states: Secondary | ICD-10-CM | POA: Diagnosis not present

## 2012-11-16 DIAGNOSIS — Z87898 Personal history of other specified conditions: Secondary | ICD-10-CM | POA: Diagnosis not present

## 2012-11-16 DIAGNOSIS — Z5189 Encounter for other specified aftercare: Secondary | ICD-10-CM | POA: Diagnosis not present

## 2012-11-18 ENCOUNTER — Ambulatory Visit (HOSPITAL_COMMUNITY)
Admission: RE | Admit: 2012-11-18 | Discharge: 2012-11-18 | Disposition: A | Discharge status: Home / Self Care | Payer: Medicare Other | Source: Ambulatory Visit | Attending: Cardiovascular Disease | Admitting: Cardiovascular Disease

## 2012-11-18 DIAGNOSIS — I359 Nonrheumatic aortic valve disorder, unspecified: Secondary | ICD-10-CM | POA: Insufficient documentation

## 2012-11-18 DIAGNOSIS — Z5189 Encounter for other specified aftercare: Secondary | ICD-10-CM | POA: Diagnosis not present

## 2012-11-18 DIAGNOSIS — Z9889 Other specified postprocedural states: Secondary | ICD-10-CM | POA: Diagnosis not present

## 2012-11-18 DIAGNOSIS — Z87898 Personal history of other specified conditions: Secondary | ICD-10-CM | POA: Diagnosis not present

## 2012-11-18 NOTE — Progress Notes (Signed)
[facility]   2D echo completed 11/18/2012.   Jamison Neighbor, RDCS

## 2012-11-19 DIAGNOSIS — Z9889 Other specified postprocedural states: Secondary | ICD-10-CM | POA: Diagnosis not present

## 2012-11-19 DIAGNOSIS — Z5189 Encounter for other specified aftercare: Secondary | ICD-10-CM | POA: Diagnosis not present

## 2012-11-19 DIAGNOSIS — Z87898 Personal history of other specified conditions: Secondary | ICD-10-CM | POA: Diagnosis not present

## 2012-11-23 DIAGNOSIS — Z5189 Encounter for other specified aftercare: Secondary | ICD-10-CM | POA: Diagnosis not present

## 2012-11-23 DIAGNOSIS — Z9889 Other specified postprocedural states: Secondary | ICD-10-CM | POA: Diagnosis not present

## 2012-11-23 DIAGNOSIS — Z87898 Personal history of other specified conditions: Secondary | ICD-10-CM | POA: Diagnosis not present

## 2012-12-07 DIAGNOSIS — Z87898 Personal history of other specified conditions: Secondary | ICD-10-CM | POA: Diagnosis not present

## 2012-12-07 DIAGNOSIS — Z9889 Other specified postprocedural states: Secondary | ICD-10-CM | POA: Diagnosis not present

## 2012-12-07 DIAGNOSIS — Z5189 Encounter for other specified aftercare: Secondary | ICD-10-CM | POA: Diagnosis not present

## 2012-12-09 DIAGNOSIS — Z87898 Personal history of other specified conditions: Secondary | ICD-10-CM | POA: Diagnosis not present

## 2012-12-09 DIAGNOSIS — Z5189 Encounter for other specified aftercare: Secondary | ICD-10-CM | POA: Diagnosis not present

## 2012-12-09 DIAGNOSIS — Z9889 Other specified postprocedural states: Secondary | ICD-10-CM | POA: Diagnosis not present

## 2012-12-11 DIAGNOSIS — Z9889 Other specified postprocedural states: Secondary | ICD-10-CM | POA: Diagnosis not present

## 2012-12-11 DIAGNOSIS — Z5189 Encounter for other specified aftercare: Secondary | ICD-10-CM | POA: Diagnosis not present

## 2012-12-11 DIAGNOSIS — Z87898 Personal history of other specified conditions: Secondary | ICD-10-CM | POA: Diagnosis not present

## 2013-02-02 DIAGNOSIS — H903 Sensorineural hearing loss, bilateral: Secondary | ICD-10-CM | POA: Diagnosis not present

## 2013-02-18 ENCOUNTER — Other Ambulatory Visit: Payer: Self-pay | Admitting: Pharmacist Clinician (PhC)/ Clinical Pharmacy Specialist

## 2013-02-18 MED ORDER — METOPROLOL TARTRATE 25 MG PO TABS: 25.0000 mg | ORAL_TABLET | Freq: Two times a day (BID) | ORAL | Status: DC

## 2013-03-01 DIAGNOSIS — H2589 Other age-related cataract: Secondary | ICD-10-CM | POA: Diagnosis not present

## 2013-06-04 DIAGNOSIS — Z1231 Encounter for screening mammogram for malignant neoplasm of breast: Secondary | ICD-10-CM | POA: Diagnosis not present

## 2013-06-28 DIAGNOSIS — K219 Gastro-esophageal reflux disease without esophagitis: Secondary | ICD-10-CM | POA: Diagnosis not present

## 2013-06-28 DIAGNOSIS — E782 Mixed hyperlipidemia: Secondary | ICD-10-CM | POA: Diagnosis not present

## 2013-06-28 DIAGNOSIS — E559 Vitamin D deficiency, unspecified: Secondary | ICD-10-CM | POA: Diagnosis not present

## 2013-06-28 DIAGNOSIS — Z79899 Other long term (current) drug therapy: Secondary | ICD-10-CM | POA: Diagnosis not present

## 2013-06-28 DIAGNOSIS — G43909 Migraine, unspecified, not intractable, without status migrainosus: Secondary | ICD-10-CM | POA: Diagnosis not present

## 2013-06-28 DIAGNOSIS — Z23 Encounter for immunization: Secondary | ICD-10-CM | POA: Diagnosis not present

## 2013-06-28 DIAGNOSIS — E039 Hypothyroidism, unspecified: Secondary | ICD-10-CM | POA: Diagnosis not present

## 2013-06-28 DIAGNOSIS — Z0000 Encounter for general adult medical examination without abnormal findings: Secondary | ICD-10-CM | POA: Diagnosis not present

## 2013-07-02 DIAGNOSIS — Z006 Encounter for examination for normal comparison and control in clinical research program: Secondary | ICD-10-CM | POA: Diagnosis not present

## 2013-07-02 DIAGNOSIS — E782 Mixed hyperlipidemia: Secondary | ICD-10-CM | POA: Diagnosis not present

## 2013-07-02 DIAGNOSIS — M810 Age-related osteoporosis without current pathological fracture: Secondary | ICD-10-CM | POA: Diagnosis not present

## 2013-07-05 DIAGNOSIS — Z1211 Encounter for screening for malignant neoplasm of colon: Secondary | ICD-10-CM | POA: Diagnosis not present

## 2013-07-06 DIAGNOSIS — M810 Age-related osteoporosis without current pathological fracture: Secondary | ICD-10-CM | POA: Diagnosis not present

## 2013-08-30 DIAGNOSIS — H2589 Other age-related cataract: Secondary | ICD-10-CM | POA: Diagnosis not present

## 2013-09-28 ENCOUNTER — Other Ambulatory Visit: Payer: Self-pay | Admitting: Cardiovascular Disease

## 2013-09-29 NOTE — Telephone Encounter (Signed)
Rx was sent to pharmacy electronically. 

## 2013-10-15 DIAGNOSIS — H04129 Dry eye syndrome of unspecified lacrimal gland: Secondary | ICD-10-CM | POA: Diagnosis not present

## 2013-10-26 ENCOUNTER — Other Ambulatory Visit: Payer: Self-pay | Admitting: Cardiovascular Disease

## 2013-10-27 NOTE — Telephone Encounter (Signed)
Rx was sent to pharmacy electronically. 

## 2013-10-29 ENCOUNTER — Other Ambulatory Visit: Payer: Self-pay | Admitting: *Deleted

## 2013-10-29 MED ORDER — METOPROLOL TARTRATE 25 MG PO TABS
25.0000 mg | ORAL_TABLET | Freq: Two times a day (BID) | ORAL | Status: DC
Start: 2013-10-29 — End: 2014-07-01

## 2013-11-17 ENCOUNTER — Telehealth: Payer: Self-pay | Admitting: Cardiovascular Disease

## 2013-11-23 ENCOUNTER — Ambulatory Visit: Payer: Medicare Other | Admitting: Cardiovascular Disease

## 2013-11-25 NOTE — Telephone Encounter (Signed)
Closed encounter °

## 2013-11-26 DIAGNOSIS — H04129 Dry eye syndrome of unspecified lacrimal gland: Secondary | ICD-10-CM | POA: Diagnosis not present

## 2013-12-03 ENCOUNTER — Ambulatory Visit (INDEPENDENT_AMBULATORY_CARE_PROVIDER_SITE_OTHER): Payer: Medicare Other | Admitting: Cardiovascular Disease

## 2013-12-03 ENCOUNTER — Encounter: Payer: Self-pay | Admitting: Cardiovascular Disease

## 2013-12-03 VITALS — BP 120/86 | HR 57 | Ht 61.0 in | Wt 131.3 lb

## 2013-12-03 DIAGNOSIS — I4891 Unspecified atrial fibrillation: Secondary | ICD-10-CM

## 2013-12-03 DIAGNOSIS — Z952 Presence of prosthetic heart valve: Secondary | ICD-10-CM

## 2013-12-03 DIAGNOSIS — Z8679 Personal history of other diseases of the circulatory system: Secondary | ICD-10-CM

## 2013-12-03 DIAGNOSIS — Z954 Presence of other heart-valve replacement: Secondary | ICD-10-CM | POA: Diagnosis not present

## 2013-12-03 DIAGNOSIS — I480 Paroxysmal atrial fibrillation: Secondary | ICD-10-CM

## 2013-12-03 NOTE — Assessment & Plan Note (Signed)
Status post aortic valve patient and with a porcine aortic valve in 2004. She is noted to have aortic root dilatation and underwent resection by Dr. Lucianne Lei tried to 07/17/12 with resuspension of her previously placed porcine valve. 2-D echocardiogram performed 11/18/12 revealed a normally functioning porcine aortic bioprosthesis with normal LV systolic function. I'm going to recheck a 2-D echocardiogram.

## 2013-12-03 NOTE — Assessment & Plan Note (Signed)
Maintaining normal sinus rhythm with occasional episodes of palpitations

## 2013-12-03 NOTE — Patient Instructions (Signed)
  We will see you back in follow up in 1 year with Dr [name]  Dr [name] has ordered  echocardiogram. Echocardiography is a painless test that uses sound waves to create images of your heart. It provides your doctor with information about the size and shape of your heart and how well your heart's chambers and valves are working. This procedure takes approximately one hour. There are no restrictions for this procedure.

## 2013-12-03 NOTE — Progress Notes (Signed)
[id number] [name] [name]   [id number]  [id number]  Primary Physician Ann Held, MD Primary Cardiologist: Lorretta Harp MD Renae Gloss   HPI:  The patient is a 73 year old, thin-appearing, married Caucasian female, mother of 67, grandmother to 1 grandchild who recently underwent re-do sternotomy and ascending fusiform thoracic aortic aneurysm resection and grafting using a 30-mm Hemashield straight graft. Her prosthetic valve which was placed 10 years ago was resuspended. I had cath'd her June 18, 2012, revealing normal coronary arteries, normal LV function and normal right heart pressures. Her postop course was uncomplicated except for some minor A-fib/flutter which converted to sinus rhythm prior to discharge. She recovered nicely from her descending thoracic aortic aneurysm resection and grafting. I last saw her January of 2014. Since that time she's had occasional episodes of palpitations but denies chest pain or shortness of breath..     Current Outpatient Prescriptions  Medication Sig Dispense Refill  . aspirin EC 81 MG tablet Take 81 mg by mouth daily.      . Calcium-Magnesium-Vitamin D (CALCIUM 500 PO) Take 500 mg by mouth 2 (two) times daily.      . Cholecalciferol (VITAMIN D) 2000 UNITS CAPS Take 2,000 Units by mouth 2 (two) times daily.      [name] CINNAMON PO Take 2,000 mg by mouth daily.       . fish oil-omega-3 fatty acids 1000 MG capsule Take 2 g by mouth daily.       . metoprolol tartrate (LOPRESSOR) 25 MG tablet Take 1 tablet (25 mg total) by mouth 2 (two) times daily.  60 tablet  6  . Multiple Vitamins-Minerals (OCUVITE PO) Take 1 tablet by mouth daily.       . pantoprazole (PROTONIX) 40 MG tablet Take 40 mg by mouth daily.      Vladimir Faster Glycol-Propyl Glycol (SYSTANE OP) Place 1 drop into both eyes daily.      . ranitidine (ZANTAC) 75 MG tablet Take 75 mg by mouth daily as needed. indigestion      . SUMAtriptan (IMITREX) 50 MG tablet Take 50 mg by  mouth every 2 (two) hours as needed. For migraines       No current facility-administered medications for this visit.    Allergies  Allergen Reactions  . Other Nausea And Vomiting    Headache also  Also from artificial sweeetners  . Codeine Nausea And Vomiting    History   Social History  . Marital Status: Married    Spouse Name: N/A    Number of Children: N/A  . Years of Education: N/A   Occupational History  . Not on file.   Social History Main Topics  . Smoking status: Never Smoker   . Smokeless tobacco: Never Used  . Alcohol Use: No  . Drug Use: No  . Sexual Activity: Not Currently   Other Topics Concern  . Not on file   Social History Narrative  . No narrative on file     Review of Systems: General: negative for chills, fever, night sweats or weight changes.  Cardiovascular: negative for chest pain, dyspnea on exertion, edema, orthopnea, palpitations, paroxysmal nocturnal dyspnea or shortness of breath Dermatological: negative for rash Respiratory: negative for cough or wheezing Urologic: negative for hematuria Abdominal: negative for nausea, vomiting, diarrhea, bright red blood per rectum, melena, or hematemesis Neurologic: negative for visual changes, syncope, or dizziness All other systems reviewed and are otherwise negative except as noted above.    Blood  pressure 120/86, pulse 57, height 5\' 1"  (1.549 m), weight 131 lb 4.8 oz (59.557 kg).  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 She is noted to have aortic root dilatation and underwent resection by Dr. Lucianne Lei tried to 07/17/12 with resuspension of her previously placed porcine valve. 2-D echocardiogram performed 11/18/12 revealed a normally functioning porcine aortic bioprosthesis with normal LV systolic function. I'm going to recheck a 2-D echocardiogram.  PAF post Aortic root replacement- Amio s/p TEE DCCV  SR at discharge Maintaining normal sinus rhythm with occasional episodes of palpitations      Lorretta Harp MD [facility], [facility] 12/03/2013 11:31 AM

## 2013-12-09 ENCOUNTER — Ambulatory Visit (HOSPITAL_COMMUNITY)
Admission: RE | Admit: 2013-12-09 | Discharge: 2013-12-09 | Disposition: A | Discharge status: Home / Self Care | Payer: Medicare Other | Source: Ambulatory Visit | Attending: Cardiology | Admitting: Cardiology

## 2013-12-09 DIAGNOSIS — Z954 Presence of other heart-valve replacement: Secondary | ICD-10-CM | POA: Insufficient documentation

## 2013-12-09 DIAGNOSIS — I517 Cardiomegaly: Secondary | ICD-10-CM

## 2013-12-09 DIAGNOSIS — Z952 Presence of prosthetic heart valve: Secondary | ICD-10-CM

## 2013-12-09 NOTE — Progress Notes (Signed)
2D Echo Performed 12/09/2013    Marygrace Drought, RCS

## 2013-12-13 ENCOUNTER — Encounter: Payer: Self-pay | Admitting: *Deleted

## 2014-01-01 IMAGING — CR DG CHEST 1V PORT
1 series · 1 of 1 positions shown · non-contrast
Comparison: 07/07/2012

CLINICAL DATA: Aortic aneurysm.

PORTABLE CHEST - 1 VIEW

[AP]
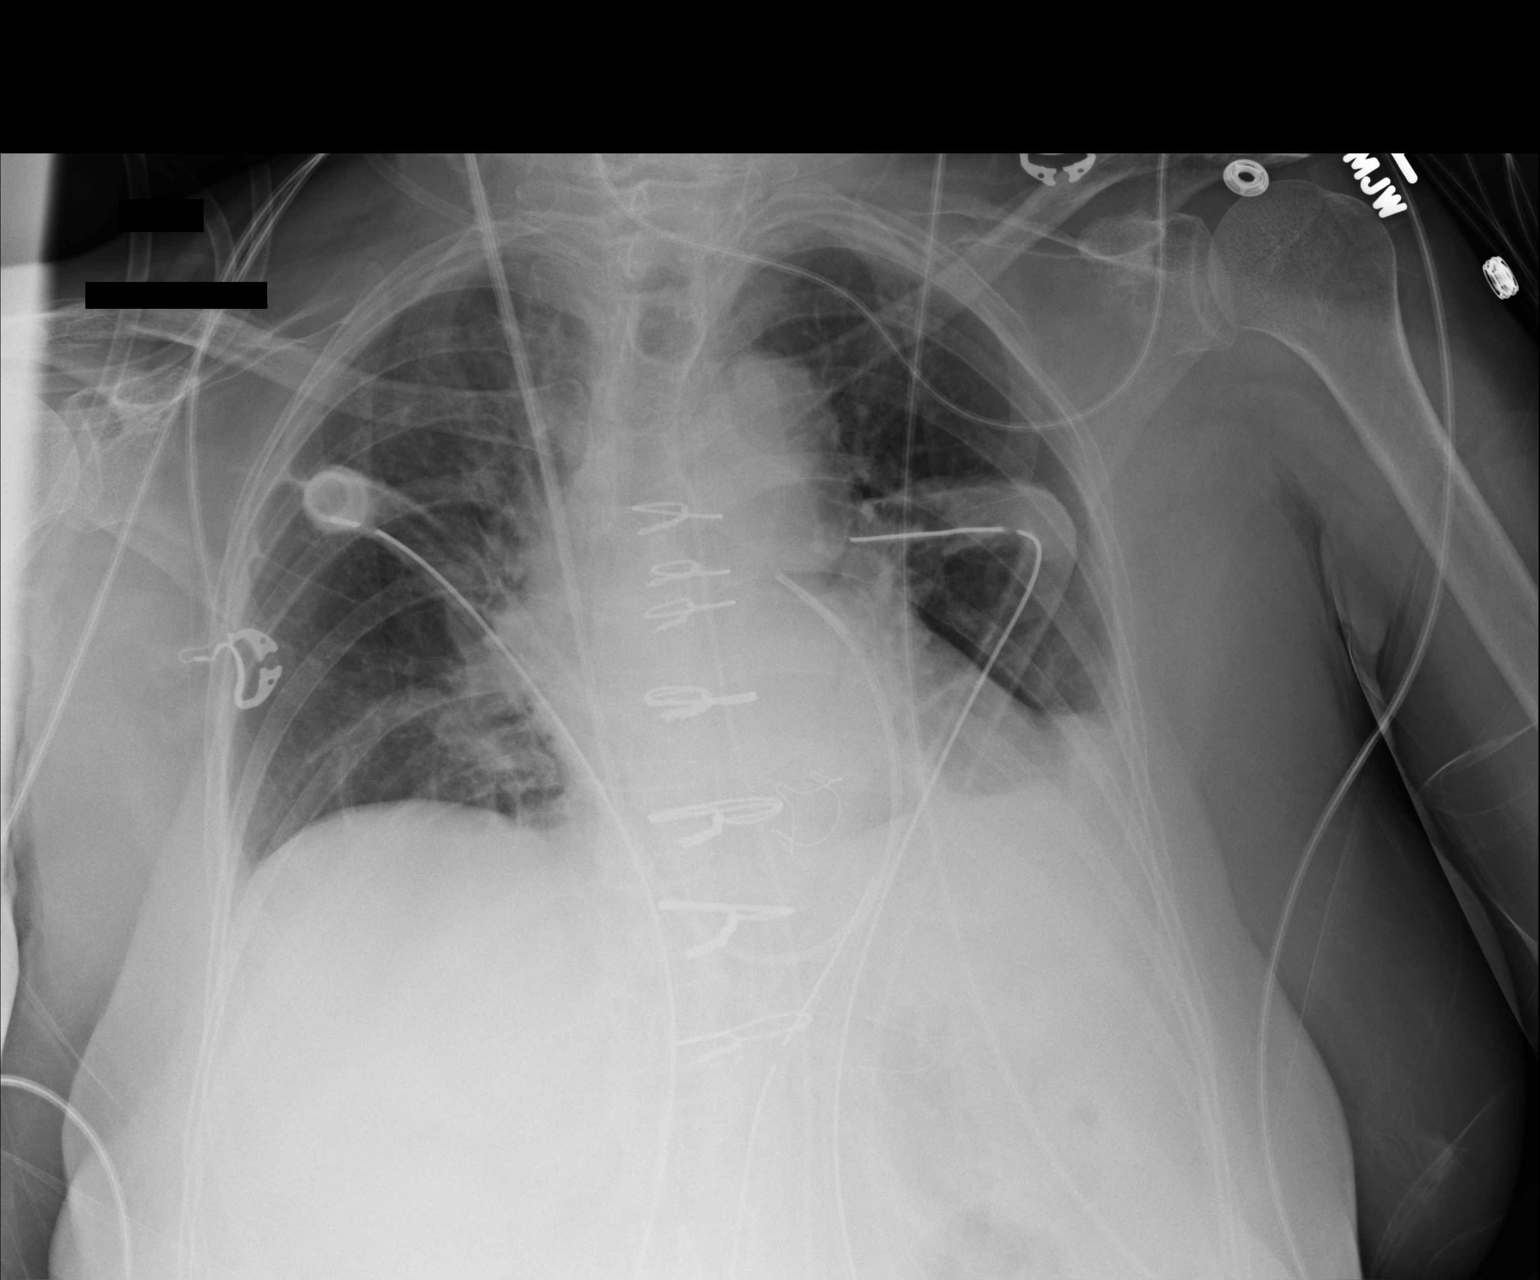

[1 of 1 positions shown; findings below may reference images not displayed]

FINDINGS: Endotracheal tube and NG tube have been removed.  Swan-
Ganz catheter tip is in the main pulmonary artery.  Bilateral chest
tubes are unchanged.  No pneumothorax.  Minimal atelectasis at the
bases.  No effusions.  Improved aeration in the right upper lobe.
IMPRESSION: Improved aeration in the right upper lobe.  Minimal atelectasis at
the bases.

## 2014-01-02 IMAGING — CR DG CHEST 1V PORT
1 series · 1 of 1 positions shown · non-contrast
Comparison: 07/08/2012

CLINICAL DATA: Aneurysm of the ascending thoracic aorta.

PORTABLE CHEST - 1 VIEW

[AP]
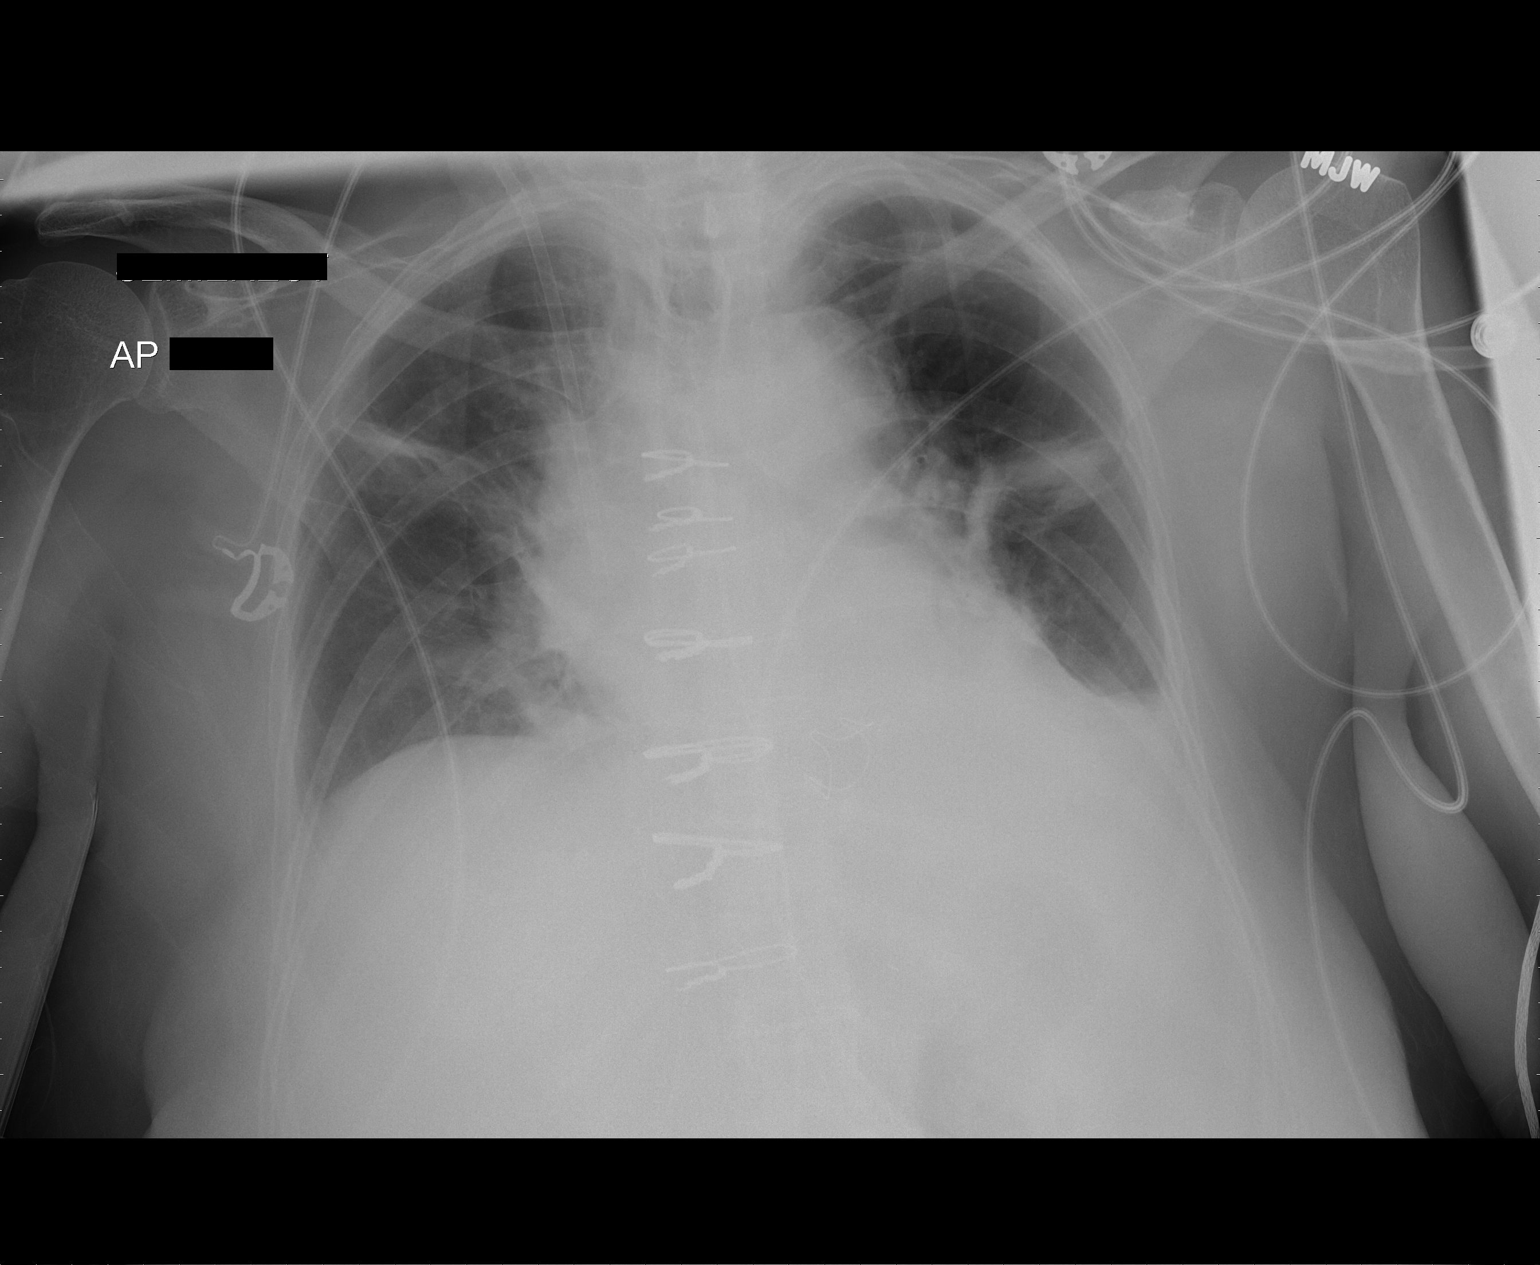

[1 of 1 positions shown; findings below may reference images not displayed]

FINDINGS: Swan-Ganz catheter and chest tubes have been removed.
Sheath of the Swan-Ganz catheter remains in the superior vena cava.
No pneumothorax after chest tube removal.

Slight atelectasis in the midzones bilaterally.  Increased
atelectasis at both bases, left greater than right.  Small left
effusion.
IMPRESSION: Progressive atelectasis primarily at the left base with a small
left effusion.

## 2014-01-09 IMAGING — CR DG CHEST 1V PORT
1 series · 1 of 1 positions shown · non-contrast
Comparison: Portable chest x-ray of 07/15/2012

CLINICAL DATA: Chest pain, history of thoracic aortic aneurysm

PORTABLE CHEST - 1 VIEW

[AP]
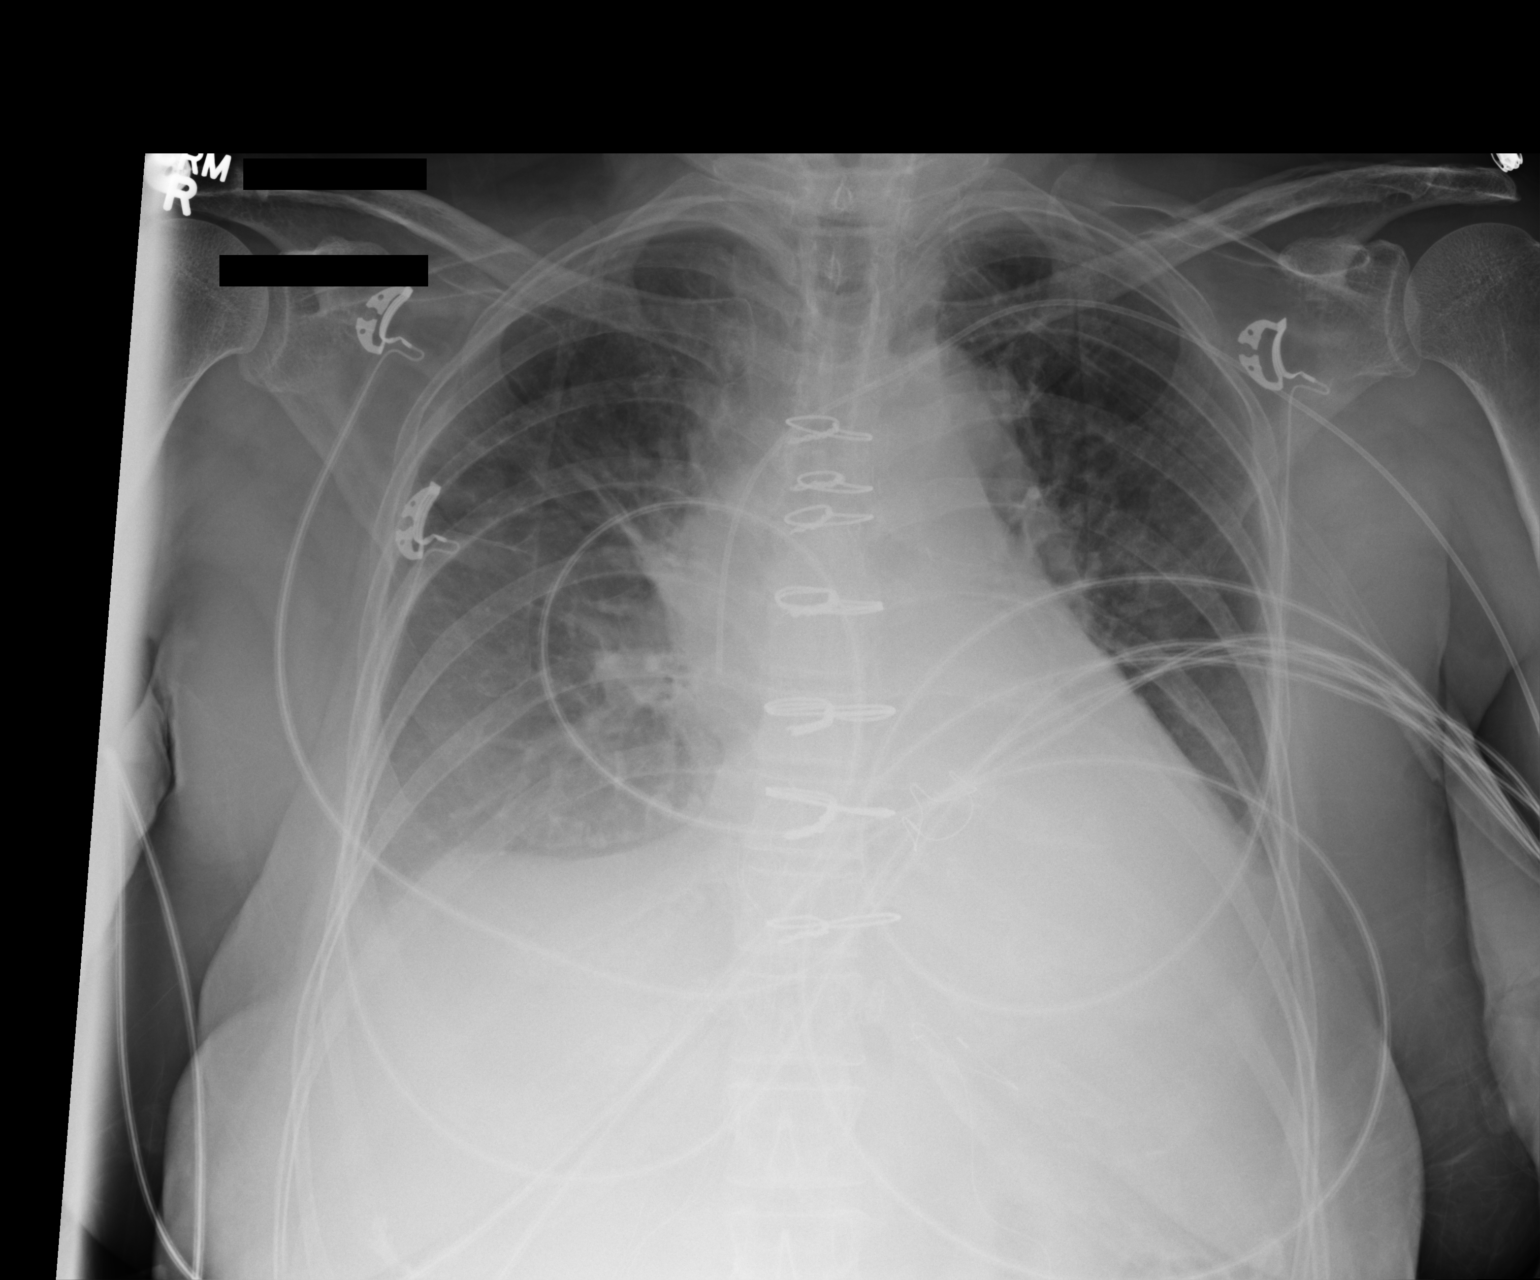

[1 of 1 positions shown; findings below may reference images not displayed]

FINDINGS: There is more airspace disease most consistent with mild
edema with effusions. Consolidation at the left lung base is
stable.  Cardiomegaly is stable.  The left central venous line is
unchanged in position overlying the expected SVC - RA junction.
IMPRESSION: Worsening of probable edema pattern with effusions.  No change in
left basilar consolidation.

## 2014-03-02 DIAGNOSIS — H1045 Other chronic allergic conjunctivitis: Secondary | ICD-10-CM | POA: Diagnosis not present

## 2014-03-02 DIAGNOSIS — M7989 Other specified soft tissue disorders: Secondary | ICD-10-CM | POA: Diagnosis not present

## 2014-05-17 DIAGNOSIS — T7840XA Allergy, unspecified, initial encounter: Secondary | ICD-10-CM | POA: Diagnosis not present

## 2014-06-03 DIAGNOSIS — H25813 Combined forms of age-related cataract, bilateral: Secondary | ICD-10-CM | POA: Diagnosis not present

## 2014-06-10 DIAGNOSIS — Z23 Encounter for immunization: Secondary | ICD-10-CM | POA: Diagnosis not present

## 2014-07-01 ENCOUNTER — Other Ambulatory Visit: Payer: Self-pay | Admitting: Cardiovascular Disease

## 2014-07-14 ENCOUNTER — Encounter (HOSPITAL_COMMUNITY): Payer: Self-pay | Admitting: Cardiovascular Disease

## 2014-07-22 DIAGNOSIS — Z1231 Encounter for screening mammogram for malignant neoplasm of breast: Secondary | ICD-10-CM | POA: Diagnosis not present

## 2014-08-10 DIAGNOSIS — G8929 Other chronic pain: Secondary | ICD-10-CM | POA: Diagnosis not present

## 2014-08-10 DIAGNOSIS — R1013 Epigastric pain: Secondary | ICD-10-CM | POA: Diagnosis not present

## 2014-08-10 DIAGNOSIS — Z09 Encounter for follow-up examination after completed treatment for conditions other than malignant neoplasm: Secondary | ICD-10-CM | POA: Diagnosis not present

## 2014-08-10 DIAGNOSIS — Z952 Presence of prosthetic heart valve: Secondary | ICD-10-CM | POA: Diagnosis not present

## 2014-08-10 DIAGNOSIS — E782 Mixed hyperlipidemia: Secondary | ICD-10-CM | POA: Diagnosis not present

## 2014-08-10 DIAGNOSIS — Z79899 Other long term (current) drug therapy: Secondary | ICD-10-CM | POA: Diagnosis not present

## 2014-08-10 DIAGNOSIS — E559 Vitamin D deficiency, unspecified: Secondary | ICD-10-CM | POA: Diagnosis not present

## 2014-08-11 DIAGNOSIS — Z1211 Encounter for screening for malignant neoplasm of colon: Secondary | ICD-10-CM | POA: Diagnosis not present

## 2014-09-01 DIAGNOSIS — R1013 Epigastric pain: Secondary | ICD-10-CM | POA: Diagnosis not present

## 2014-09-08 DIAGNOSIS — E782 Mixed hyperlipidemia: Secondary | ICD-10-CM | POA: Diagnosis not present

## 2014-09-08 DIAGNOSIS — K219 Gastro-esophageal reflux disease without esophagitis: Secondary | ICD-10-CM | POA: Diagnosis not present

## 2014-09-08 DIAGNOSIS — N183 Chronic kidney disease, stage 3 (moderate): Secondary | ICD-10-CM | POA: Diagnosis not present

## 2014-09-09 DIAGNOSIS — H25813 Combined forms of age-related cataract, bilateral: Secondary | ICD-10-CM | POA: Diagnosis not present

## 2014-09-15 ENCOUNTER — Ambulatory Visit: Payer: Medicare Other | Admitting: Cardiovascular Disease

## 2014-09-15 DIAGNOSIS — S022XXA Fracture of nasal bones, initial encounter for closed fracture: Secondary | ICD-10-CM | POA: Diagnosis not present

## 2014-09-15 DIAGNOSIS — S40012A Contusion of left shoulder, initial encounter: Secondary | ICD-10-CM | POA: Diagnosis not present

## 2014-09-15 DIAGNOSIS — S60212A Contusion of left wrist, initial encounter: Secondary | ICD-10-CM | POA: Diagnosis not present

## 2014-09-15 DIAGNOSIS — S0993XA Unspecified injury of face, initial encounter: Secondary | ICD-10-CM | POA: Diagnosis not present

## 2014-09-21 DIAGNOSIS — S022XXA Fracture of nasal bones, initial encounter for closed fracture: Secondary | ICD-10-CM | POA: Diagnosis not present

## 2014-09-27 DIAGNOSIS — M810 Age-related osteoporosis without current pathological fracture: Secondary | ICD-10-CM | POA: Diagnosis not present

## 2014-10-04 ENCOUNTER — Telehealth: Payer: Self-pay | Admitting: Cardiovascular Disease

## 2014-10-04 NOTE — Telephone Encounter (Signed)
Sounds MSK in nature - since no relief with NTG>   Would take Tylenol or Ibuprofen (or full dose ASA) if recurs; If no Sx with routine activity, simply ROV as scheduled.  [name] [name]

## 2014-10-04 NOTE — Telephone Encounter (Signed)
Pt of Dr. [name], Had experienced pain on left side under ribcage last night that "wasn't excruciating, but woke [her] from sleep". She took an aspirin, pain seemed to go away for a while. After she had a return of pain, she took 1x Nitro SL to no effect.  Took 2nd nitro, seemed to resolve then.  Today she states no pain, pt prefers a wait and see approach. OK to be seen sooner if warranted. She has ROV in mid-April.   We did discuss that this could be due to a myriad of issues, but given her history worth considering evaluation.  Will defer to [facility] to advise.

## 2014-10-04 NOTE — Telephone Encounter (Signed)
Pt c/o of Chest Pain: STAT if CP now or developed within 24 hours  1. Are you having CP right now? No only upon excertion   2. Are you experiencing any other symptoms (ex. SOB, nausea, vomiting, sweating)? no  3. How long have you been experiencing CP? A couple of weeks  4. Is your CP continuous or coming and going? Coming and going  5. Have you taken Nitroglycerin? She took one last night ?

## 2014-10-04 NOTE — Telephone Encounter (Signed)
Left message to call back  

## 2014-10-04 NOTE — Telephone Encounter (Signed)
Spoke to patient, communicated Dr. Allison Quarry indications. She voiced understanding.

## 2014-10-28 ENCOUNTER — Encounter: Payer: Self-pay | Admitting: *Deleted

## 2014-11-15 ENCOUNTER — Encounter: Payer: Self-pay | Admitting: Cardiovascular Disease

## 2014-11-15 ENCOUNTER — Ambulatory Visit (INDEPENDENT_AMBULATORY_CARE_PROVIDER_SITE_OTHER): Payer: Medicare Other | Admitting: Cardiovascular Disease

## 2014-11-15 VITALS — BP 124/76 | HR 64 | Ht 61.0 in | Wt 133.9 lb

## 2014-11-15 DIAGNOSIS — Z8679 Personal history of other diseases of the circulatory system: Secondary | ICD-10-CM | POA: Diagnosis not present

## 2014-11-15 DIAGNOSIS — I480 Paroxysmal atrial fibrillation: Secondary | ICD-10-CM

## 2014-11-15 DIAGNOSIS — I7121 Aneurysm of the ascending aorta, without rupture: Secondary | ICD-10-CM

## 2014-11-15 DIAGNOSIS — I77810 Thoracic aortic ectasia: Secondary | ICD-10-CM

## 2014-11-15 DIAGNOSIS — I712 Thoracic aortic aneurysm, without rupture: Secondary | ICD-10-CM | POA: Diagnosis not present

## 2014-11-15 DIAGNOSIS — E785 Hyperlipidemia, unspecified: Secondary | ICD-10-CM | POA: Insufficient documentation

## 2014-11-15 DIAGNOSIS — R002 Palpitations: Secondary | ICD-10-CM

## 2014-11-15 NOTE — Assessment & Plan Note (Signed)
Status post redo sternotomy and a descending thoracic aortic aneurysm resection and grafting using a 30 mm Hemashield straight graft by Dr. Tharon Aquas Trigt December 0000000 with an uncomplicated postoperative course except for some minor A. Fib/flutter which converted prior to discharge.

## 2014-11-15 NOTE — Assessment & Plan Note (Signed)
History of aortic stenosis status post bioprosthetic aortic valve replacement January 2004. Her last 2-D echo performed May of last year revealed normal LV function with a well functioning aortic bioprosthesis. We will repeat her 2-D echocardiogram.

## 2014-11-15 NOTE — Assessment & Plan Note (Signed)
History of hyperlipidemia with recent lipid profile performed by her PCP revealing a total cholesterol of 229, LDL 158 and HDL of 50. She was begun on pravastatin with plans to recheck a lipid profile by her PCP

## 2014-11-15 NOTE — Patient Instructions (Signed)
  We will see you back in follow up in 1 year with Dr Berry  Dr Berry has ordered: 1.  Echocardiogram. Echocardiography is a painless test that uses sound waves to create images of your heart. It provides your doctor with information about the size and shape of your heart and how well your heart's chambers and valves are working. This procedure takes approximately one hour. There are no restrictions for this procedure.      

## 2014-11-15 NOTE — Progress Notes (Signed)
[id number] [name] [name]   [id number]  [id number]  Primary Physician [name] Held, MD Primary Cardiologist: Lorretta Harp MD Renae Gloss   HPI:  The patient is a 74 year old, thin-appearing, married Caucasian female, mother of 73, grandmother to 1 grandchild who recently underwent re-do sternotomy and ascending fusiform thoracic aortic aneurysm resection and grafting using a 30-mm Hemashield straight graft. Her prosthetic valve which was placed 10 years ago (January 2004) was resuspended. I had cath'd her June 18, 2012, revealing normal coronary arteries, normal LV function and normal right heart pressures. Her postop course was uncomplicated except for some minor A-fib/flutter which converted to sinus rhythm prior to discharge. She recovered nicely from her descending thoracic aortic aneurysm resection and grafting. I last saw her 12/03/13. Since that time she's had occasional episodes of palpitations but denies chest pain or shortness of breath..    Current Outpatient Prescriptions  Medication Sig Dispense Refill  . aspirin EC 81 MG tablet Take 81 mg by mouth daily.    [name] azelastine (OPTIVAR) 0.05 % ophthalmic solution Place 1 drop into both eyes daily as needed (redness and itching).    . calcium gluconate 500 MG tablet Take 1 tablet by mouth daily.    . Cholecalciferol (VITAMIN D) 2000 UNITS CAPS Take 2,000 Units by mouth 2 (two) times daily.    . fexofenadine (ALLEGRA) 180 MG tablet Take 180 mg by mouth daily.    . metoprolol tartrate (LOPRESSOR) 25 MG tablet TAKE 1 TABLET BY MOUTH TWICE DAILY. 60 tablet 6  . mometasone (NASONEX) 50 MCG/ACT nasal spray Place 2 sprays into the nose daily as needed (allergies).    . Multiple Vitamins-Minerals (OCUVITE ADULT FORMULA) CAPS Take 1 capsule by mouth daily.    . pantoprazole (PROTONIX) 40 MG tablet Take 40 mg by mouth daily.    . pravastatin (PRAVACHOL) 20 MG tablet Take 1 tablet by mouth every evening.    . SUMAtriptan  (IMITREX) 50 MG tablet Take 50 mg by mouth every 2 (two) hours as needed. For migraines     No current facility-administered medications for this visit.    Allergies  Allergen Reactions  . Other Nausea And Vomiting    Headache also  Also from artificial sweeetners  . Codeine Nausea And Vomiting    History   Social History  . Marital Status: Married    Spouse Name: N/A  . Number of Children: N/A  . Years of Education: N/A   Occupational History  . Not on file.   Social History Main Topics  . Smoking status: Never Smoker   . Smokeless tobacco: Never Used  . Alcohol Use: No  . Drug Use: No  . Sexual Activity: Not Currently   Other Topics Concern  . Not on file   Social History Narrative     Review of Systems: General: negative for chills, fever, night sweats or weight changes.  Cardiovascular: negative for chest pain, dyspnea on exertion, edema, orthopnea, palpitations, paroxysmal nocturnal dyspnea or shortness of breath Dermatological: negative for rash Respiratory: negative for cough or wheezing Urologic: negative for hematuria Abdominal: negative for nausea, vomiting, diarrhea, bright red blood per rectum, melena, or hematemesis Neurologic: negative for visual changes, syncope, or dizziness All other systems reviewed and are otherwise negative except as noted above.    Blood pressure 124/76, pulse 64, height 5\' 1"  (1.549 m), weight 133 lb 14.4 oz (60.737 kg).  General appearance: alert and no distress Neck: no adenopathy, no carotid bruit,  no JVD, supple, symmetrical, trachea midline and thyroid not enlarged, symmetric, no tenderness/mass/nodules Lungs: clear to auscultation bilaterally Heart: regular rate and rhythm, S1, S2 normal, no murmur, click, rub or gallop Extremities: extremities normal, atraumatic, no cyanosis or edema  EKG normal sinus rhythm at 64 without ST or T-wave changes. I personally reviewed this EKG  ASSESSMENT AND PLAN:   Thoracic  ascending aortic aneurysm Status post redo sternotomy and a descending thoracic aortic aneurysm resection and grafting using a 30 mm Hemashield straight graft by Dr. Tharon Aquas Trigt December 0000000 with an uncomplicated postoperative course except for some minor A. Fib/flutter which converted prior to discharge.   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 Her last 2-D echo performed May of last year revealed normal LV function with a well functioning aortic bioprosthesis. We will repeat her 2-D echocardiogram.   Hyperlipidemia History of hyperlipidemia with recent lipid profile performed by her PCP revealing a total cholesterol of 229, LDL 158 and HDL of 50. She was begun on pravastatin with plans to recheck a lipid profile by her PCP       Lorretta Harp MD [facility], [facility] 11/15/2014 12:44 PM

## 2014-11-17 ENCOUNTER — Encounter: Payer: Self-pay | Admitting: Cardiovascular Disease

## 2014-11-24 ENCOUNTER — Ambulatory Visit (HOSPITAL_COMMUNITY)
Admission: RE | Admit: 2014-11-24 | Discharge: 2014-11-24 | Disposition: A | Discharge status: Home / Self Care | Payer: Medicare Other | Source: Ambulatory Visit | Attending: Cardiology | Admitting: Cardiology

## 2014-11-24 DIAGNOSIS — Z8679 Personal history of other diseases of the circulatory system: Secondary | ICD-10-CM | POA: Diagnosis not present

## 2014-11-24 DIAGNOSIS — E785 Hyperlipidemia, unspecified: Secondary | ICD-10-CM | POA: Diagnosis not present

## 2014-11-24 DIAGNOSIS — Z79899 Other long term (current) drug therapy: Secondary | ICD-10-CM | POA: Diagnosis not present

## 2014-11-24 NOTE — Progress Notes (Addendum)
2D Echocardiogram Complete.  11/24/2014   Deliah Boston, RDCS   Preliminary Technician Findings:  The mid and distal portion of the ascending aorta is difficult to visualize.  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

## 2014-12-01 ENCOUNTER — Telehealth: Payer: Self-pay | Admitting: Cardiovascular Disease

## 2014-12-01 NOTE — Telephone Encounter (Signed)
Pt called in wanting to know if her results from her Echo that was done on 4/21 are available. Please call  Thanks

## 2014-12-02 ENCOUNTER — Telehealth: Payer: Self-pay | Admitting: Cardiovascular Disease

## 2014-12-02 NOTE — Telephone Encounter (Signed)
Pt called in stated that she had a Echo done last week and would like to know if those results have come in yet.   Thanks

## 2014-12-02 NOTE — Telephone Encounter (Signed)
Spoke with pt, aware echo has not been reviewed by dr berry. Preliminary results given to the patient

## 2014-12-22 ENCOUNTER — Telehealth: Payer: Self-pay | Admitting: *Deleted

## 2014-12-22 ENCOUNTER — Encounter: Payer: Self-pay | Admitting: *Deleted

## 2014-12-22 NOTE — Telephone Encounter (Signed)
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 ----- Message -----    From: Chauncy Lean, RN    Sent: 12/18/2014   9:02 PM      To: Lorretta Harp, MD Subject: RE: echo results                               Did you look at this echo?   ----- Message -----    From: Lorretta Harp, MD    Sent: 12/01/2014   3:40 PM      To: Chauncy Lean, RN Subject: RE: echo results                               OK ----- Message -----    From: Chauncy Lean, RN    Sent: 12/01/2014  12:27 PM      To: Lorretta Harp, MD Subject: echo results                                   For some reason her echo results didn't go to your inbasket.  Can you please take a look at it?  Thanks!

## 2014-12-22 NOTE — Telephone Encounter (Signed)
Letter mailed to patient with results.

## 2015-01-18 DIAGNOSIS — E782 Mixed hyperlipidemia: Secondary | ICD-10-CM | POA: Diagnosis not present

## 2015-01-18 DIAGNOSIS — Z79899 Other long term (current) drug therapy: Secondary | ICD-10-CM | POA: Diagnosis not present

## 2015-02-01 ENCOUNTER — Other Ambulatory Visit: Payer: Self-pay | Admitting: Cardiovascular Disease

## 2015-02-01 NOTE — Telephone Encounter (Signed)
Rx(s) sent to pharmacy electronically.  

## 2015-03-13 DIAGNOSIS — H25813 Combined forms of age-related cataract, bilateral: Secondary | ICD-10-CM | POA: Diagnosis not present

## 2015-03-31 DIAGNOSIS — M810 Age-related osteoporosis without current pathological fracture: Secondary | ICD-10-CM | POA: Diagnosis not present

## 2015-08-23 DIAGNOSIS — Z23 Encounter for immunization: Secondary | ICD-10-CM | POA: Diagnosis not present

## 2015-09-01 DIAGNOSIS — Z1231 Encounter for screening mammogram for malignant neoplasm of breast: Secondary | ICD-10-CM | POA: Diagnosis not present

## 2015-09-26 DIAGNOSIS — G43809 Other migraine, not intractable, without status migrainosus: Secondary | ICD-10-CM | POA: Diagnosis not present

## 2015-09-26 DIAGNOSIS — Z0000 Encounter for general adult medical examination without abnormal findings: Secondary | ICD-10-CM | POA: Diagnosis not present

## 2015-09-26 DIAGNOSIS — J309 Allergic rhinitis, unspecified: Secondary | ICD-10-CM | POA: Diagnosis not present

## 2015-09-26 DIAGNOSIS — Z952 Presence of prosthetic heart valve: Secondary | ICD-10-CM | POA: Diagnosis not present

## 2015-09-26 DIAGNOSIS — E782 Mixed hyperlipidemia: Secondary | ICD-10-CM | POA: Diagnosis not present

## 2015-09-26 DIAGNOSIS — Z79899 Other long term (current) drug therapy: Secondary | ICD-10-CM | POA: Diagnosis not present

## 2015-09-26 DIAGNOSIS — Z23 Encounter for immunization: Secondary | ICD-10-CM | POA: Diagnosis not present

## 2015-10-11 DIAGNOSIS — M810 Age-related osteoporosis without current pathological fracture: Secondary | ICD-10-CM | POA: Diagnosis not present

## 2015-10-19 DIAGNOSIS — L821 Other seborrheic keratosis: Secondary | ICD-10-CM | POA: Diagnosis not present

## 2015-10-19 DIAGNOSIS — C44319 Basal cell carcinoma of skin of other parts of face: Secondary | ICD-10-CM | POA: Diagnosis not present

## 2015-10-19 DIAGNOSIS — L219 Seborrheic dermatitis, unspecified: Secondary | ICD-10-CM | POA: Diagnosis not present

## 2015-11-24 ENCOUNTER — Encounter: Payer: Self-pay | Admitting: Cardiovascular Disease

## 2015-11-24 ENCOUNTER — Ambulatory Visit (INDEPENDENT_AMBULATORY_CARE_PROVIDER_SITE_OTHER): Payer: Medicare Other | Admitting: Cardiovascular Disease

## 2015-11-24 VITALS — BP 119/69 | HR 62 | Ht 61.0 in | Wt 128.4 lb

## 2015-11-24 DIAGNOSIS — Z952 Presence of prosthetic heart valve: Secondary | ICD-10-CM

## 2015-11-24 DIAGNOSIS — I209 Angina pectoris, unspecified: Secondary | ICD-10-CM | POA: Diagnosis not present

## 2015-11-24 DIAGNOSIS — Z8679 Personal history of other diseases of the circulatory system: Secondary | ICD-10-CM

## 2015-11-24 DIAGNOSIS — Z954 Presence of other heart-valve replacement: Secondary | ICD-10-CM

## 2015-11-24 DIAGNOSIS — R079 Chest pain, unspecified: Secondary | ICD-10-CM

## 2015-11-24 DIAGNOSIS — I480 Paroxysmal atrial fibrillation: Secondary | ICD-10-CM | POA: Diagnosis not present

## 2015-11-24 NOTE — Progress Notes (Signed)
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 Her prosthetic valve which was placed 10 years ago (January 2004) was resuspended. I had cath'd her June 18, 2012, revealing normal coronary arteries, normal LV function and normal right heart pressures. Her postop course was uncomplicated except for some minor A-fib/flutter which converted to sinus rhythm prior to discharge. She recovered nicely from her descending thoracic aortic aneurysm resection and grafting. I last saw her 11/15/14. Since that time she's had 2 episodes of worrisome chest pain this past week which were nitrate responsive. The pain radiated  into her neck shoulders and back as well.    Current Outpatient Prescriptions  Medication Sig Dispense Refill  . aspirin EC 81 MG tablet Take 81 mg by mouth daily.    [name] azelastine (OPTIVAR) 0.05 % ophthalmic solution Place 1 drop into both eyes daily as needed (redness and itching).    . calcium gluconate 500 MG tablet Take 1 tablet by mouth daily.    . Cholecalciferol (VITAMIN D) 2000 UNITS CAPS Take 2,000 Units by mouth 2 (two) times daily.    . fexofenadine (ALLEGRA) 180 MG tablet Take 180 mg by mouth daily.    . metoprolol tartrate (LOPRESSOR) 25 MG tablet TAKE 1 TABLET BY MOUTH TWICE DAILY. 60 tablet 9  . mometasone (NASONEX) 50 MCG/ACT nasal spray Place 2 sprays into the nose daily as needed (allergies).    . Multiple Vitamins-Minerals (OCUVITE ADULT FORMULA) CAPS Take 1 capsule by mouth daily.    . pantoprazole (PROTONIX) 40 MG tablet Take 40 mg by mouth daily.    . pravastatin (PRAVACHOL) 20 MG  tablet Take 1 tablet by mouth every evening.    . SUMAtriptan (IMITREX) 50 MG tablet Take 50 mg by mouth every 2 (two) hours as needed. For migraines     No current facility-administered medications for this visit.    Allergies  Allergen Reactions  . Other Nausea And Vomiting    Headache also  Also from artificial sweeetners  . Codeine Nausea And Vomiting    Social History   Social History  . Marital Status: Married    Spouse Name: N/A  . Number of Children: N/A  . Years of Education: N/A   Occupational History  . Not on file.   Social History Main Topics  . Smoking status: Never Smoker   . Smokeless tobacco: Never Used  . Alcohol Use: No  . Drug Use: No  . Sexual Activity: Not Currently   Other Topics Concern  . Not on file   Social History Narrative     Review of Systems: General: negative for chills, fever, night sweats or weight changes.  Cardiovascular: negative for chest pain, dyspnea on exertion, edema, orthopnea, palpitations, paroxysmal nocturnal dyspnea or shortness of breath Dermatological: negative for rash Respiratory: negative for cough or wheezing Urologic: negative for hematuria Abdominal: negative for nausea, vomiting, diarrhea, bright red blood per rectum, melena, or hematemesis Neurologic: negative for visual changes, syncope, or dizziness All other systems reviewed and are otherwise negative except as noted above.    Blood pressure 119/69, pulse 62, height 5\' 1"  (1.549 m), weight 128 lb 6.4 oz (58.242  kg).  General appearance: alert and no distress Neck: no adenopathy, no carotid bruit, no JVD, supple, symmetrical, trachea midline and thyroid not enlarged, symmetric, no tenderness/mass/nodules Lungs: clear to auscultation bilaterally Heart: ssoft outflow tract murmur consistent with aortic stenosis Extremities: extremities normal, atraumatic, no cyanosis or edema  EKG sinus rhythm at 62 without ST or T-wave changes. 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 Her prosthetic valve was resuspended. 2-D echo performed here ago showed normal LV systolic function and mild to moderate AI.  Normal coronary arteries 2004, low risk Myoview 3/12 History of normal coronary arteries at cardiac catheterization 06/18/12. She has had 2 episodes of chest pain this past week. I'm going to get an exercise Myoview stress test to risk stratify her.  Hyperlipidemia History of hyperlipidemia on statin therapy followed by her PCP      Lorretta Harp MD [facility], [facility] 11/24/2015 3:09 PM

## 2015-11-24 NOTE — Patient Instructions (Signed)
Medication Instructions:  Your physician recommends that you continue on your current medications as directed. Please refer to the Current Medication list given to you today.   Labwork: none  Testing/Procedures: Your physician has requested that you have an echocardiogram. Echocardiography is a painless test that uses sound waves to create images of your heart. It provides your doctor with information about the size and shape of your heart and how well your heart's chambers and valves are working. This procedure takes approximately one hour. There are no restrictions for this procedure. EARLY NEXT WEEK  Your physician has requested that you have a lexiscan myoview. For further information please visit [email or web address]. Please follow instruction sheet, as given. EARLY NEXT WEEK   Follow-Up: Your physician wants you to follow-up in: 12 months with Dr. [name]. You will receive a reminder letter in the mail two months in advance. If you don't receive a letter, please call our office to schedule the follow-up appointment.   Any Other Special Instructions Will Be Listed Below (If Applicable).     If you need a refill on your cardiac medications before your next appointment, please call your pharmacy.

## 2015-11-24 NOTE — Assessment & Plan Note (Signed)
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 Her prosthetic valve was resuspended. 2-D echo performed here ago showed normal LV systolic function and mild to moderate AI.

## 2015-11-24 NOTE — Assessment & Plan Note (Signed)
History of normal coronary arteries at cardiac catheterization 06/18/12. She has had 2 episodes of chest pain this past week. I'm going to get an exercise Myoview stress test to risk stratify her.

## 2015-11-24 NOTE — Assessment & Plan Note (Signed)
History of hyperlipidemia on statin therapy followed by her PCP. 

## 2015-11-29 ENCOUNTER — Telehealth (HOSPITAL_COMMUNITY): Payer: Self-pay

## 2015-11-29 NOTE — Telephone Encounter (Signed)
Encounter complete. 

## 2015-11-30 ENCOUNTER — Ambulatory Visit (HOSPITAL_COMMUNITY)
Admission: RE | Admit: 2015-11-30 | Discharge: 2015-11-30 | Disposition: A | Discharge status: Home / Self Care | Payer: Medicare Other | Source: Ambulatory Visit | Attending: Cardiology | Admitting: Cardiology

## 2015-11-30 DIAGNOSIS — I340 Nonrheumatic mitral (valve) insufficiency: Secondary | ICD-10-CM | POA: Diagnosis not present

## 2015-11-30 DIAGNOSIS — I517 Cardiomegaly: Secondary | ICD-10-CM | POA: Diagnosis not present

## 2015-11-30 DIAGNOSIS — Z953 Presence of xenogenic heart valve: Secondary | ICD-10-CM | POA: Insufficient documentation

## 2015-11-30 DIAGNOSIS — Z8679 Personal history of other diseases of the circulatory system: Secondary | ICD-10-CM | POA: Diagnosis not present

## 2015-11-30 DIAGNOSIS — I480 Paroxysmal atrial fibrillation: Secondary | ICD-10-CM | POA: Insufficient documentation

## 2015-11-30 DIAGNOSIS — E785 Hyperlipidemia, unspecified: Secondary | ICD-10-CM | POA: Insufficient documentation

## 2015-11-30 DIAGNOSIS — Z954 Presence of other heart-valve replacement: Secondary | ICD-10-CM

## 2015-11-30 DIAGNOSIS — I351 Nonrheumatic aortic (valve) insufficiency: Secondary | ICD-10-CM | POA: Insufficient documentation

## 2015-11-30 DIAGNOSIS — R079 Chest pain, unspecified: Secondary | ICD-10-CM

## 2015-11-30 DIAGNOSIS — R0600 Dyspnea, unspecified: Secondary | ICD-10-CM | POA: Diagnosis present

## 2015-11-30 DIAGNOSIS — Z952 Presence of prosthetic heart valve: Secondary | ICD-10-CM

## 2015-11-30 LAB — ECHOCARDIOGRAM COMPLETE
A4CEF: 69 %
AOPV: 0.36 m/s
AORTIC ROOT 2D: 35 mm
AV Mean grad: 17 mmHg
AV Peak grad: 29 mmHg
CHL CUP AV PEAK INDEX: 0.65
CHL CUP AV VALUE AREA INDEX: 0.77
CHL CUP AV VEL: 1.22
CHL CUP LA SIZE INDEX: 2.2 mm/m2
CHL CUP LA VOL 2D: 20 mL
CHL CUP LVOT SV INDEX: 45 mL/m2
CHL CUP MV DEC (S): 342 ms
DOP CAL AO MEAN VELOCITY: 199 cm/s
E/e' ratio: 6.84
EWDT: 342 ms
FS: 37 % (ref 28–44)
IV/PV OW: 0.92
LA ID, A-P, ES: 35 mm
LA VOL 2D INDEX: 12.5 mL/m2
LA vol: 36 mL
LAVOLIN: 22.6 mL/m2
LDCA: 2.84 cm2
LEFT ATRIUM END SYS DIAM: 35 mm
LV PW d: 14 mm — AB (ref 0.6–1.1)
LV SIMPSON'S DISK: 70
LV TDI E'LATERAL: 10.5 cm/s
LV TDI E'MEDIAL: 5.65 cm/s
LV dias vol index: 44 mL/m2
LV dias vol: 70 mL (ref 46–106)
LV sys vol index: 13 mL/m2
LVIDD: 40.4 mm — AB (ref 3.5–6.0)
LVIDS: 25.5 mm — AB (ref 2.1–4.0)
LVOT MEAN VEL: 73.3 cm/s
LVOT SV: 72 mL
LVOT VTI: 25.2 cm
LVOT peak VTI: 0.43 cm
LVOT peak vel: 96.4 cm/s
LVOTD: 19 mm
LVSYSVOL: 70 mL — AB (ref 14–42)
Lateral S' vel: 9.46 cm/s
MV pk A vel: 101 cm/s
MVPG: 2 mmHg
MVPKEVEL: 71.8 cm/s
P 1/2 time: 603 ms
PWSYS: 14 mm
SV INDEX: 30.7 mL/m2
Stroke v: 49 ml
TR max vel: 238 cm/s
TV PEAK RV-RA GRADIENT: 23 cm/s
VTI: 58.7 cm
Valve area: 1.22 cm2

## 2015-12-01 ENCOUNTER — Ambulatory Visit (HOSPITAL_COMMUNITY)
Admission: RE | Admit: 2015-12-01 | Discharge: 2015-12-01 | Disposition: A | Discharge status: Home / Self Care | Payer: Medicare Other | Source: Ambulatory Visit | Attending: Cardiovascular Disease | Admitting: Cardiovascular Disease

## 2015-12-01 DIAGNOSIS — Z953 Presence of xenogenic heart valve: Secondary | ICD-10-CM | POA: Insufficient documentation

## 2015-12-01 DIAGNOSIS — R0609 Other forms of dyspnea: Secondary | ICD-10-CM | POA: Insufficient documentation

## 2015-12-01 DIAGNOSIS — Z8679 Personal history of other diseases of the circulatory system: Secondary | ICD-10-CM | POA: Insufficient documentation

## 2015-12-01 DIAGNOSIS — R42 Dizziness and giddiness: Secondary | ICD-10-CM | POA: Diagnosis not present

## 2015-12-01 DIAGNOSIS — I480 Paroxysmal atrial fibrillation: Secondary | ICD-10-CM | POA: Insufficient documentation

## 2015-12-01 DIAGNOSIS — R5383 Other fatigue: Secondary | ICD-10-CM | POA: Diagnosis not present

## 2015-12-01 DIAGNOSIS — R079 Chest pain, unspecified: Secondary | ICD-10-CM | POA: Insufficient documentation

## 2015-12-01 DIAGNOSIS — Z954 Presence of other heart-valve replacement: Secondary | ICD-10-CM | POA: Diagnosis not present

## 2015-12-01 DIAGNOSIS — Z952 Presence of prosthetic heart valve: Secondary | ICD-10-CM

## 2015-12-01 LAB — MYOCARDIAL PERFUSION IMAGING
CHL CUP RESTING HR STRESS: 55 {beats}/min
CSEPPHR: 86 {beats}/min
LVDIAVOL: 69 mL (ref 46–106)
LVSYSVOL: 19 mL
NUC STRESS TID: 1.03
SDS: 1
SRS: 2
SSS: 3

## 2015-12-01 MED ORDER — TECHNETIUM TC 99M SESTAMIBI GENERIC - CARDIOLITE
9.9000 | Freq: Once | INTRAVENOUS | Status: AC | PRN
  Administered 2015-12-01: 10 via INTRAVENOUS

## 2015-12-01 MED ORDER — TECHNETIUM TC 99M SESTAMIBI GENERIC - CARDIOLITE
30.1000 | Freq: Once | INTRAVENOUS | Status: AC | PRN
  Administered 2015-12-01: 30.1 via INTRAVENOUS

## 2015-12-01 MED ORDER — AMINOPHYLLINE 25 MG/ML IV SOLN
75.0000 mg | Freq: Once | INTRAVENOUS | Status: AC
  Administered 2015-12-01: 75 mg via INTRAVENOUS

## 2015-12-01 MED ORDER — REGADENOSON 0.4 MG/5ML IV SOLN
0.4000 mg | Freq: Once | INTRAVENOUS | Status: AC
  Administered 2015-12-01: 0.4 mg via INTRAVENOUS

## 2015-12-05 ENCOUNTER — Other Ambulatory Visit: Payer: Self-pay | Admitting: Cardiovascular Disease

## 2015-12-05 ENCOUNTER — Telehealth: Payer: Self-pay

## 2015-12-05 DIAGNOSIS — I77810 Thoracic aortic ectasia: Secondary | ICD-10-CM

## 2015-12-05 NOTE — Telephone Encounter (Signed)
REFILL 

## 2015-12-05 NOTE — Telephone Encounter (Signed)
-----   Message from Lorretta Harp, MD sent at 12/02/2015  3:30 PM EDT ----- No change from prior study. Repeat in 12 months.

## 2015-12-12 DIAGNOSIS — H25813 Combined forms of age-related cataract, bilateral: Secondary | ICD-10-CM | POA: Diagnosis not present

## 2015-12-12 DIAGNOSIS — B5801 Toxoplasma chorioretinitis: Secondary | ICD-10-CM | POA: Diagnosis not present

## 2016-01-02 DIAGNOSIS — R1013 Epigastric pain: Secondary | ICD-10-CM | POA: Diagnosis not present

## 2016-01-02 DIAGNOSIS — Z1211 Encounter for screening for malignant neoplasm of colon: Secondary | ICD-10-CM | POA: Diagnosis not present

## 2016-01-05 DIAGNOSIS — Z1211 Encounter for screening for malignant neoplasm of colon: Secondary | ICD-10-CM | POA: Diagnosis not present

## 2016-01-05 DIAGNOSIS — K253 Acute gastric ulcer without hemorrhage or perforation: Secondary | ICD-10-CM | POA: Diagnosis not present

## 2016-01-05 DIAGNOSIS — K2950 Unspecified chronic gastritis without bleeding: Secondary | ICD-10-CM | POA: Diagnosis not present

## 2016-01-05 DIAGNOSIS — R1013 Epigastric pain: Secondary | ICD-10-CM | POA: Diagnosis not present

## 2016-01-08 ENCOUNTER — Encounter: Payer: Self-pay | Admitting: Cardiovascular Disease

## 2016-01-08 NOTE — Progress Notes (Unsigned)
Patient ID: [name] [name], female   DOB: Mar 27, 1941, 75 y.o.   MRN: [id number] Called by dr. Nehemiah Settle, GI, [place]. The patient has developed gastric ulcers on low dose ASA despite concurrent PPI therapy. OK to temporarily stop ASA (she has a biologiacl AVR from 2004, normal coronaries).  MCr

## 2016-02-09 DIAGNOSIS — R42 Dizziness and giddiness: Secondary | ICD-10-CM | POA: Diagnosis not present

## 2016-02-09 DIAGNOSIS — J019 Acute sinusitis, unspecified: Secondary | ICD-10-CM | POA: Diagnosis not present

## 2016-02-15 DIAGNOSIS — K253 Acute gastric ulcer without hemorrhage or perforation: Secondary | ICD-10-CM | POA: Diagnosis not present

## 2016-03-18 DIAGNOSIS — K449 Diaphragmatic hernia without obstruction or gangrene: Secondary | ICD-10-CM | POA: Diagnosis not present

## 2016-03-18 DIAGNOSIS — K253 Acute gastric ulcer without hemorrhage or perforation: Secondary | ICD-10-CM | POA: Diagnosis not present

## 2016-03-18 DIAGNOSIS — K219 Gastro-esophageal reflux disease without esophagitis: Secondary | ICD-10-CM | POA: Diagnosis not present

## 2016-04-10 DIAGNOSIS — L578 Other skin changes due to chronic exposure to nonionizing radiation: Secondary | ICD-10-CM | POA: Diagnosis not present

## 2016-04-10 DIAGNOSIS — L821 Other seborrheic keratosis: Secondary | ICD-10-CM | POA: Diagnosis not present

## 2016-04-17 ENCOUNTER — Encounter: Payer: Self-pay | Admitting: Cardiovascular Disease

## 2016-04-17 ENCOUNTER — Ambulatory Visit (INDEPENDENT_AMBULATORY_CARE_PROVIDER_SITE_OTHER): Payer: Medicare Other | Admitting: Cardiovascular Disease

## 2016-04-17 VITALS — BP 110/76 | HR 62 | Ht 61.0 in | Wt 124.8 lb

## 2016-04-17 DIAGNOSIS — Z954 Presence of other heart-valve replacement: Secondary | ICD-10-CM

## 2016-04-17 DIAGNOSIS — Z952 Presence of prosthetic heart valve: Secondary | ICD-10-CM

## 2016-04-17 NOTE — Assessment & Plan Note (Signed)
History of hyperlipidemia on pravastatin followed by her PCP 

## 2016-04-17 NOTE — Progress Notes (Signed)
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 Her prosthetic valve which was placed 10 years ago (January 2004) was resuspended. I had cath'd her June 18, 2012, revealing normal coronary arteries, normal LV function and normal right heart pressures. Her postop course was uncomplicated except for some minor A-fib/flutter which converted to sinus rhythm prior to discharge. She recovered nicely from her descending thoracic aortic aneurysm resection and grafting. I last saw her 11/24/15.Marland KitchenShe had 2 episodes of worrisome chest pain prior to that visit which were nitrate responsive. The pain radiated  into her neck shoulders and back as well. A Myoview stress test performed 12/01/15 was low risk and not ischemic. A 2-D echo showed normal LV size and function, mild LVH with moderate aortic insufficiency. She's had no recurrent chest pain. She was diagnosed with gastric ulcers thought to be related to her low dose aspirin which has been discontinued.   Current Outpatient Prescriptions  Medication Sig Dispense Refill  . aspirin EC 81 MG tablet Take 81 mg by mouth daily.    . calcium gluconate 500 MG tablet Take 1 tablet by mouth daily.    . Cholecalciferol (VITAMIN D) 2000 UNITS CAPS Take 2,000 Units by mouth 2 (two) times daily.    . fexofenadine (ALLEGRA) 180 MG tablet Take 180 mg by mouth daily.    . metoprolol tartrate (LOPRESSOR) 25 MG tablet TAKE 1 TABLET BY MOUTH TWICE DAILY. 60 tablet 11  . mometasone (NASONEX) 50 MCG/ACT nasal spray Place 2 sprays into the nose daily as needed (allergies).    . Multiple  Vitamins-Minerals (OCUVITE ADULT FORMULA) CAPS Take 1 capsule by mouth daily.    . pantoprazole (PROTONIX) 40 MG tablet Take 40 mg by mouth daily.    . pravastatin (PRAVACHOL) 20 MG tablet Take 1 tablet by mouth every evening.    . SUMAtriptan (IMITREX) 50 MG tablet Take 50 mg by mouth every 2 (two) hours as needed. For migraines     No current facility-administered medications for this visit.     Allergies  Allergen Reactions  . Other Nausea And Vomiting    Headache also  Also from artificial sweeetners  . Codeine Nausea And Vomiting    Social History   Social History  . Marital status: Married    Spouse name: N/A  . Number of children: N/A  . Years of education: N/A   Occupational History  . Not on file.   Social History Main Topics  . Smoking status: Never Smoker  . Smokeless tobacco: Never Used  . Alcohol use No  . Drug use: No  . Sexual activity: Not Currently   Other Topics Concern  . Not on file   Social History Narrative  . No narrative on file     Review of Systems: General: negative for chills, fever, night sweats or weight changes.  Cardiovascular: negative for chest pain, dyspnea on exertion, edema, orthopnea, palpitations, paroxysmal nocturnal dyspnea or shortness of breath Dermatological: negative for rash Respiratory: negative for cough or wheezing Urologic: negative for hematuria Abdominal: negative for nausea, vomiting, diarrhea, bright red blood per rectum, melena, or hematemesis Neurologic: negative for visual changes, syncope,  or dizziness All other systems reviewed and are otherwise negative except as noted above.    Blood pressure 110/76, pulse 62, height 5\' 1"  (1.549 m), weight 124 lb 12.8 oz (56.6 kg).  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 Her prosthetic valve which was placed gently 2004 for aortic stenosis was resuspended. I did catheter 06/18/12 revealing normal coronary arteries. Because of chest pain back in April and Myoview stress test was performed that was entirely normal. A 2-D echo showed normal LV size and function, mild LVH and moderate AI.  Hyperlipidemia History of hyperlipidemia on pravastatin followed by her PCP      Lorretta Harp MD [facility], [facility] 04/17/2016 11:01 AM

## 2016-04-17 NOTE — Patient Instructions (Signed)
Medication Instructions:  Your physician recommends that you continue on your current medications as directed. Please refer to the Current Medication list given to you today.  Labwork: NONE  Testing/Procedures: Your physician has requested that you have an echocardiogram. Echocardiography is a painless test that uses sound waves to create images of your heart. It provides your doctor with information about the size and shape of your heart and how well your heart's chambers and valves are working. This procedure takes approximately one hour. There are no restrictions for this procedure. [address] [place]  Follow-Up: Your physician wants you to follow-up in: [place] will receive a reminder letter in the mail two months in advance. If you don't receive a letter, please call our office to schedule the follow-up appointment.  If you need a refill on your cardiac medications before your next appointment, please call your pharmacy.

## 2016-04-17 NOTE — Assessment & Plan Note (Signed)
History of thoracic aortic aneurysm resection and grafting using a 30 mm Hemashield straight graft. Her prosthetic valve which was placed gently 2004 for aortic stenosis was resuspended. I did catheter 06/18/12 revealing normal coronary arteries. Because of chest pain back in April and Myoview stress test was performed that was entirely normal. A 2-D echo showed normal LV size and function, mild LVH and moderate AI.

## 2016-04-19 DIAGNOSIS — M810 Age-related osteoporosis without current pathological fracture: Secondary | ICD-10-CM | POA: Diagnosis not present

## 2016-06-07 DIAGNOSIS — B9689 Other specified bacterial agents as the cause of diseases classified elsewhere: Secondary | ICD-10-CM | POA: Diagnosis not present

## 2016-06-07 DIAGNOSIS — J329 Chronic sinusitis, unspecified: Secondary | ICD-10-CM | POA: Diagnosis not present

## 2016-06-13 DIAGNOSIS — J0101 Acute recurrent maxillary sinusitis: Secondary | ICD-10-CM | POA: Diagnosis not present

## 2016-06-13 DIAGNOSIS — J301 Allergic rhinitis due to pollen: Secondary | ICD-10-CM | POA: Diagnosis not present

## 2016-07-02 DIAGNOSIS — H25813 Combined forms of age-related cataract, bilateral: Secondary | ICD-10-CM | POA: Diagnosis not present

## 2016-07-04 DIAGNOSIS — Z23 Encounter for immunization: Secondary | ICD-10-CM | POA: Diagnosis not present

## 2016-07-08 DIAGNOSIS — R51 Headache: Secondary | ICD-10-CM | POA: Diagnosis not present

## 2016-07-08 DIAGNOSIS — Z1389 Encounter for screening for other disorder: Secondary | ICD-10-CM | POA: Diagnosis not present

## 2016-07-08 DIAGNOSIS — Z9181 History of falling: Secondary | ICD-10-CM | POA: Diagnosis not present

## 2016-07-08 DIAGNOSIS — J310 Chronic rhinitis: Secondary | ICD-10-CM | POA: Diagnosis not present

## 2016-09-04 DIAGNOSIS — J320 Chronic maxillary sinusitis: Secondary | ICD-10-CM | POA: Diagnosis not present

## 2016-10-17 DIAGNOSIS — M810 Age-related osteoporosis without current pathological fracture: Secondary | ICD-10-CM | POA: Diagnosis not present

## 2016-10-21 DIAGNOSIS — L570 Actinic keratosis: Secondary | ICD-10-CM | POA: Diagnosis not present

## 2016-10-21 DIAGNOSIS — L821 Other seborrheic keratosis: Secondary | ICD-10-CM | POA: Diagnosis not present

## 2016-10-21 DIAGNOSIS — D2239 Melanocytic nevi of other parts of face: Secondary | ICD-10-CM | POA: Diagnosis not present

## 2016-10-21 DIAGNOSIS — L578 Other skin changes due to chronic exposure to nonionizing radiation: Secondary | ICD-10-CM | POA: Diagnosis not present

## 2016-11-18 ENCOUNTER — Ambulatory Visit (HOSPITAL_COMMUNITY): Payer: Medicare Other | Attending: Cardiology

## 2016-11-18 ENCOUNTER — Other Ambulatory Visit: Payer: Self-pay

## 2016-11-18 DIAGNOSIS — I272 Pulmonary hypertension, unspecified: Secondary | ICD-10-CM | POA: Insufficient documentation

## 2016-11-18 DIAGNOSIS — Z952 Presence of prosthetic heart valve: Secondary | ICD-10-CM | POA: Diagnosis not present

## 2016-11-18 DIAGNOSIS — I083 Combined rheumatic disorders of mitral, aortic and tricuspid valves: Secondary | ICD-10-CM | POA: Diagnosis not present

## 2016-11-22 DIAGNOSIS — M21612 Bunion of left foot: Secondary | ICD-10-CM | POA: Diagnosis not present

## 2016-11-22 DIAGNOSIS — M21611 Bunion of right foot: Secondary | ICD-10-CM | POA: Diagnosis not present

## 2016-11-22 DIAGNOSIS — M2041 Other hammer toe(s) (acquired), right foot: Secondary | ICD-10-CM | POA: Diagnosis not present

## 2016-11-22 DIAGNOSIS — M2042 Other hammer toe(s) (acquired), left foot: Secondary | ICD-10-CM | POA: Diagnosis not present

## 2016-11-22 DIAGNOSIS — M79672 Pain in left foot: Secondary | ICD-10-CM | POA: Diagnosis not present

## 2016-11-22 DIAGNOSIS — M79671 Pain in right foot: Secondary | ICD-10-CM | POA: Diagnosis not present

## 2016-11-26 ENCOUNTER — Telehealth: Payer: Self-pay | Admitting: Cardiovascular Disease

## 2016-11-26 NOTE — Telephone Encounter (Signed)
Returned the phone call to the patient to inform her of the ECHO results per Dr. [name]:  Notes recorded by Lorretta Harp, MD on 11/20/2016 at 3:19 PM EDT Normal LV size and function, normal bile prosthesis and aortic position, normal thoracic aorta and mild pulmonary hypertension. Repeat 12 months  The patient verbalized her understanding.

## 2016-11-26 NOTE — Telephone Encounter (Signed)
New message     Pt would like the results for her ECHo

## 2016-11-27 ENCOUNTER — Other Ambulatory Visit: Payer: Self-pay | Admitting: Cardiovascular Disease

## 2016-11-27 DIAGNOSIS — I712 Thoracic aortic aneurysm, without rupture: Secondary | ICD-10-CM

## 2016-11-27 DIAGNOSIS — I7121 Aneurysm of the ascending aorta, without rupture: Secondary | ICD-10-CM

## 2016-11-27 DIAGNOSIS — I77810 Thoracic aortic ectasia: Secondary | ICD-10-CM

## 2016-11-27 DIAGNOSIS — I480 Paroxysmal atrial fibrillation: Secondary | ICD-10-CM

## 2016-12-07 ENCOUNTER — Other Ambulatory Visit: Payer: Self-pay | Admitting: Cardiovascular Disease

## 2016-12-09 ENCOUNTER — Telehealth: Payer: Self-pay | Admitting: Cardiovascular Disease

## 2016-12-09 NOTE — Telephone Encounter (Signed)
metoprolol tartrate (LOPRESSOR) 25 MG tablet  Medication  Date: 12/09/2016 Department: [facility] Ordering/Authorizing: Lorretta Harp, MD  Order Providers   Prescribing Provider Encounter Provider  Lorretta Harp, MD Lorretta Harp, MD  Medication Detail    Disp Refills Start End   metoprolol tartrate (LOPRESSOR) 25 MG tablet 60 tablet 0 12/09/2016    Sig - Route: Take 1 tablet (25 mg total) by mouth 2 (two) times daily. Need appointment for further refills - Oral   E-Prescribing Status: Receipt confirmed by pharmacy (12/09/2016 2:07 PM EDT)   [place], [place]

## 2016-12-09 NOTE — Telephone Encounter (Signed)
New message      *STAT* If patient is at the pharmacy, call can be transferred to refill team.   1. Which medications need to be refilled? (please list name of each medication and dose if known) metoprolol tartrate (LOPRESSOR) 25 MG tablet  2. Which pharmacy/location (including street and city if local pharmacy) is medication to be sent to? [place] in [place] [phone]   3. Do they need a 30 day or 90 day supply? 30 days

## 2016-12-20 DIAGNOSIS — G43909 Migraine, unspecified, not intractable, without status migrainosus: Secondary | ICD-10-CM | POA: Diagnosis not present

## 2016-12-20 DIAGNOSIS — Z79899 Other long term (current) drug therapy: Secondary | ICD-10-CM | POA: Diagnosis not present

## 2016-12-20 DIAGNOSIS — M810 Age-related osteoporosis without current pathological fracture: Secondary | ICD-10-CM | POA: Diagnosis not present

## 2016-12-20 DIAGNOSIS — Z0000 Encounter for general adult medical examination without abnormal findings: Secondary | ICD-10-CM | POA: Diagnosis not present

## 2016-12-20 DIAGNOSIS — Z952 Presence of prosthetic heart valve: Secondary | ICD-10-CM | POA: Diagnosis not present

## 2016-12-20 DIAGNOSIS — E559 Vitamin D deficiency, unspecified: Secondary | ICD-10-CM | POA: Diagnosis not present

## 2016-12-20 DIAGNOSIS — E782 Mixed hyperlipidemia: Secondary | ICD-10-CM | POA: Diagnosis not present

## 2017-01-02 DIAGNOSIS — H25813 Combined forms of age-related cataract, bilateral: Secondary | ICD-10-CM | POA: Diagnosis not present

## 2017-01-03 ENCOUNTER — Encounter: Payer: Self-pay | Admitting: Cardiovascular Disease

## 2017-01-03 ENCOUNTER — Ambulatory Visit (INDEPENDENT_AMBULATORY_CARE_PROVIDER_SITE_OTHER): Payer: Medicare Other | Admitting: Cardiovascular Disease

## 2017-01-03 VITALS — BP 122/86 | HR 71 | Ht 61.0 in | Wt 128.0 lb

## 2017-01-03 DIAGNOSIS — I480 Paroxysmal atrial fibrillation: Secondary | ICD-10-CM | POA: Diagnosis not present

## 2017-01-03 DIAGNOSIS — Z8679 Personal history of other diseases of the circulatory system: Secondary | ICD-10-CM

## 2017-01-03 DIAGNOSIS — E7800 Pure hypercholesterolemia, unspecified: Secondary | ICD-10-CM

## 2017-01-03 DIAGNOSIS — I77810 Thoracic aortic ectasia: Secondary | ICD-10-CM

## 2017-01-03 MED ORDER — PANTOPRAZOLE SODIUM 40 MG PO TBEC: 40.0000 mg | DELAYED_RELEASE_TABLET | Freq: Every day | ORAL | 3 refills | Status: DC

## 2017-01-03 MED ORDER — METOPROLOL TARTRATE 25 MG PO TABS: 25.0000 mg | ORAL_TABLET | Freq: Two times a day (BID) | ORAL | 11 refills | Status: DC

## 2017-01-03 MED ORDER — PRAVASTATIN SODIUM 20 MG PO TABS: 20.0000 mg | ORAL_TABLET | Freq: Every evening | ORAL | 3 refills | Status: DC

## 2017-01-03 NOTE — Assessment & Plan Note (Signed)
Status post porcine aVR January 2004 with recent echo performed 11/18/16 revealing normal LV systolic function with well functioning aortic bioprosthesis. There was mild AI. We will follow this on an annual basis.

## 2017-01-03 NOTE — Assessment & Plan Note (Signed)
History of hyperlipidemia on statin therapy followed by her PCP. 

## 2017-01-03 NOTE — Progress Notes (Signed)
[birth year] [name] [name]   [birth year]  [id number]  Primary Physician Myer Peer, MD Primary Cardiologist: Lorretta Harp MD Renae Gloss  HPI:  The patient is a 76 year old, thin-appearing, married Caucasian female, mother of 70, grandmother to 1 grandchild who I last saw in the office 04/17/16. 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 Her prosthetic valve which was placed 10 years ago (January 2004) was resuspended. I had cath'd her June 18, 2012, revealing normal coronary arteries, normal LV function and normal right heart pressures. Her postop course was uncomplicated except for some minor A-fib/flutter which converted to sinus rhythm prior to discharge. She recovered nicely from her descending thoracic aortic aneurysm resection and grafting. I last saw her 11/24/15.Marland KitchenShe had 2 episodes of worrisome chest pain prior to that visit which were nitrate responsive. The pain radiated into her neck shoulders and back as well. A Myoview stress test performed 12/01/15 was low risk and not ischemic. A 2-D echo showed normal LV size and function, mild LVH with moderate aortic insufficiency. She's had no recurrent chest pain. She was diagnosed with gastric ulcers thought to be related to her low dose aspirin which has been discontinued. Since I saw her in the office was 2 years ago she's remained completely asymptomatic. A recent 2-D echocardiogram performed 4/60/18 revealed normal LV systolic function with a well functioning aortic bioprosthesis and mild aortic regurgitation.   Current Outpatient Prescriptions  Medication Sig Dispense Refill  . calcium gluconate 500 MG tablet Take 1 tablet by mouth daily.    . Cholecalciferol (VITAMIN D) 2000 UNITS CAPS Take 2,000 Units by mouth 2 (two) times daily.    . fexofenadine (ALLEGRA) 180 MG tablet Take 180 mg by mouth daily.    .  metoprolol tartrate (LOPRESSOR) 25 MG tablet Take 1 tablet (25 mg total) by mouth 2 (two) times daily. 60 tablet 11  . Multiple Vitamins-Minerals (OCUVITE ADULT FORMULA) CAPS Take 1 capsule by mouth daily.    . pantoprazole (PROTONIX) 40 MG tablet Take 1 tablet (40 mg total) by mouth daily. 90 tablet 3  . pravastatin (PRAVACHOL) 20 MG tablet Take 1 tablet (20 mg total) by mouth every evening. 90 tablet 3  . SUMAtriptan (IMITREX) 50 MG tablet Take 50 mg by mouth every 2 (two) hours as needed. For migraines     No current facility-administered medications for this visit.     Allergies  Allergen Reactions  . Other Nausea And Vomiting    Headache also  Also from artificial sweeetners  . Codeine Nausea And Vomiting    Social History   Social History  . Marital status: Married    Spouse name: N/A  . Number of children: N/A  . Years of education: N/A   Occupational History  . Not on file.   Social History Main Topics  . Smoking status: Never Smoker  . Smokeless tobacco: Never Used  . Alcohol use No  . Drug use: No  . Sexual activity: Not Currently   Other Topics Concern  . Not on file   Social History Narrative  . No narrative on file     Review of Systems: General: negative for chills, fever, night sweats or weight changes.  Cardiovascular: negative for chest pain, dyspnea on exertion, edema, orthopnea, palpitations, paroxysmal nocturnal dyspnea or shortness of breath Dermatological: negative for rash Respiratory: negative for cough or wheezing Urologic: negative for  hematuria Abdominal: negative for nausea, vomiting, diarrhea, bright red blood per rectum, melena, or hematemesis Neurologic: negative for visual changes, syncope, or dizziness All other systems reviewed and are otherwise negative except as noted above.    Blood pressure 122/86, pulse 71, height 5\' 1"  (1.549 m), weight 128 lb (58.1 kg), SpO2 98 %.  General appearance: alert and no distress Neck: no  adenopathy, no carotid bruit, no JVD, supple, symmetrical, trachea midline and thyroid not enlarged, symmetric, no tenderness/mass/nodules Lungs: clear to auscultation bilaterally Heart: Soft outflow tract murmur consistent with aortic stenosis versus flow murmur, audible prosthetic aortic sounds. Extremities: extremities normal, atraumatic, no cyanosis or edema  EKG sinus rhythm at 67 without ST or T-wave changes. I personally reviewed this EKG.  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 There was mild AI. We will follow this on an annual basis.  Aortic root dilatation (HCC) History of thoracic aortic aneurysm resection and grafting November 2013 with a 30 mm Hemashield straight graft. Her coronary arteries were reimplanted and her valve resuspension.  Hyperlipidemia History of hyperlipidemia on statin therapy followed by her PCP      Lorretta Harp MD [facility], [facility] 01/03/2017 8:34 AM

## 2017-01-03 NOTE — Assessment & Plan Note (Signed)
History of thoracic aortic aneurysm resection and grafting November 2013 with a 30 mm Hemashield straight graft. Her coronary arteries were reimplanted and her valve resuspension.

## 2017-01-03 NOTE — Patient Instructions (Signed)
Medication Instructions: Your physician recommends that you continue on your current medications as directed. Please refer to the Current Medication list given to you today.  Testing/Procedures: Your physician has requested that you have an echocardiogram in 1 year. Echocardiography is a painless test that uses sound waves to create images of your heart. It provides your doctor with information about the size and shape of your heart and how well your heart's chambers and valves are working. This procedure takes approximately one hour. There are no restrictions for this procedure.  Follow-Up: Your physician wants you to follow-up in: 1 year with Dr. [name] after echo is completed. You will receive a reminder letter in the mail two months in advance. If you don't receive a letter, please call our office to schedule the follow-up appointment.  If you need a refill on your cardiac medications before your next appointment, please call your pharmacy.

## 2017-01-06 DIAGNOSIS — H8149 Vertigo of central origin, unspecified ear: Secondary | ICD-10-CM | POA: Diagnosis not present

## 2017-01-06 DIAGNOSIS — R42 Dizziness and giddiness: Secondary | ICD-10-CM | POA: Diagnosis not present

## 2017-01-06 DIAGNOSIS — Z6824 Body mass index (BMI) 24.0-24.9, adult: Secondary | ICD-10-CM | POA: Diagnosis not present

## 2017-01-09 DIAGNOSIS — H8149 Vertigo of central origin, unspecified ear: Secondary | ICD-10-CM | POA: Diagnosis not present

## 2017-01-09 DIAGNOSIS — R42 Dizziness and giddiness: Secondary | ICD-10-CM | POA: Diagnosis not present

## 2017-01-15 DIAGNOSIS — R42 Dizziness and giddiness: Secondary | ICD-10-CM | POA: Diagnosis not present

## 2017-01-15 DIAGNOSIS — I7789 Other specified disorders of arteries and arterioles: Secondary | ICD-10-CM | POA: Diagnosis not present

## 2017-01-15 DIAGNOSIS — R110 Nausea: Secondary | ICD-10-CM | POA: Diagnosis not present

## 2017-01-15 DIAGNOSIS — I639 Cerebral infarction, unspecified: Secondary | ICD-10-CM | POA: Diagnosis not present

## 2017-01-28 DIAGNOSIS — H9313 Tinnitus, bilateral: Secondary | ICD-10-CM | POA: Diagnosis not present

## 2017-01-28 DIAGNOSIS — H903 Sensorineural hearing loss, bilateral: Secondary | ICD-10-CM | POA: Diagnosis not present

## 2017-01-28 DIAGNOSIS — J300 Vasomotor rhinitis: Secondary | ICD-10-CM | POA: Diagnosis not present

## 2017-01-28 DIAGNOSIS — R42 Dizziness and giddiness: Secondary | ICD-10-CM | POA: Diagnosis not present

## 2017-04-28 DIAGNOSIS — M810 Age-related osteoporosis without current pathological fracture: Secondary | ICD-10-CM | POA: Diagnosis not present

## 2017-06-13 DIAGNOSIS — Z23 Encounter for immunization: Secondary | ICD-10-CM | POA: Diagnosis not present

## 2017-07-09 DIAGNOSIS — H25813 Combined forms of age-related cataract, bilateral: Secondary | ICD-10-CM | POA: Diagnosis not present

## 2017-07-11 DIAGNOSIS — H25812 Combined forms of age-related cataract, left eye: Secondary | ICD-10-CM | POA: Diagnosis not present

## 2017-08-04 DIAGNOSIS — J0101 Acute recurrent maxillary sinusitis: Secondary | ICD-10-CM | POA: Diagnosis not present

## 2017-08-06 DIAGNOSIS — H25812 Combined forms of age-related cataract, left eye: Secondary | ICD-10-CM | POA: Diagnosis not present

## 2017-08-15 DIAGNOSIS — Z01818 Encounter for other preprocedural examination: Secondary | ICD-10-CM | POA: Diagnosis not present

## 2017-08-15 DIAGNOSIS — H25811 Combined forms of age-related cataract, right eye: Secondary | ICD-10-CM | POA: Diagnosis not present

## 2017-09-04 DIAGNOSIS — H25811 Combined forms of age-related cataract, right eye: Secondary | ICD-10-CM | POA: Diagnosis not present

## 2017-10-07 DIAGNOSIS — H5213 Myopia, bilateral: Secondary | ICD-10-CM | POA: Diagnosis not present

## 2017-10-28 DIAGNOSIS — C44319 Basal cell carcinoma of skin of other parts of face: Secondary | ICD-10-CM | POA: Diagnosis not present

## 2017-10-28 DIAGNOSIS — L578 Other skin changes due to chronic exposure to nonionizing radiation: Secondary | ICD-10-CM | POA: Diagnosis not present

## 2017-10-28 DIAGNOSIS — L821 Other seborrheic keratosis: Secondary | ICD-10-CM | POA: Diagnosis not present

## 2017-11-28 ENCOUNTER — Ambulatory Visit (HOSPITAL_COMMUNITY): Payer: Medicare Other | Attending: Cardiovascular Disease

## 2017-11-28 ENCOUNTER — Other Ambulatory Visit: Payer: Self-pay

## 2017-11-28 DIAGNOSIS — I7121 Aneurysm of the ascending aorta, without rupture: Secondary | ICD-10-CM

## 2017-11-28 DIAGNOSIS — I77810 Thoracic aortic ectasia: Secondary | ICD-10-CM | POA: Diagnosis not present

## 2017-11-28 DIAGNOSIS — Z953 Presence of xenogenic heart valve: Secondary | ICD-10-CM | POA: Insufficient documentation

## 2017-11-28 DIAGNOSIS — E785 Hyperlipidemia, unspecified: Secondary | ICD-10-CM | POA: Insufficient documentation

## 2017-11-28 DIAGNOSIS — I083 Combined rheumatic disorders of mitral, aortic and tricuspid valves: Secondary | ICD-10-CM | POA: Diagnosis not present

## 2017-11-28 DIAGNOSIS — I480 Paroxysmal atrial fibrillation: Secondary | ICD-10-CM | POA: Insufficient documentation

## 2017-11-28 DIAGNOSIS — I359 Nonrheumatic aortic valve disorder, unspecified: Secondary | ICD-10-CM | POA: Diagnosis present

## 2017-11-28 DIAGNOSIS — I712 Thoracic aortic aneurysm, without rupture: Secondary | ICD-10-CM

## 2017-12-01 DIAGNOSIS — Z79899 Other long term (current) drug therapy: Secondary | ICD-10-CM | POA: Diagnosis not present

## 2017-12-10 DIAGNOSIS — M810 Age-related osteoporosis without current pathological fracture: Secondary | ICD-10-CM | POA: Diagnosis not present

## 2017-12-22 DIAGNOSIS — Z952 Presence of prosthetic heart valve: Secondary | ICD-10-CM | POA: Diagnosis not present

## 2017-12-22 DIAGNOSIS — Z79899 Other long term (current) drug therapy: Secondary | ICD-10-CM | POA: Diagnosis not present

## 2017-12-22 DIAGNOSIS — E782 Mixed hyperlipidemia: Secondary | ICD-10-CM | POA: Diagnosis not present

## 2017-12-22 DIAGNOSIS — Z0000 Encounter for general adult medical examination without abnormal findings: Secondary | ICD-10-CM | POA: Diagnosis not present

## 2017-12-22 DIAGNOSIS — M810 Age-related osteoporosis without current pathological fracture: Secondary | ICD-10-CM | POA: Diagnosis not present

## 2017-12-30 ENCOUNTER — Other Ambulatory Visit: Payer: Self-pay | Admitting: Cardiovascular Disease

## 2017-12-30 NOTE — Telephone Encounter (Signed)
Rx sent to pharmacy   

## 2018-01-06 ENCOUNTER — Ambulatory Visit (INDEPENDENT_AMBULATORY_CARE_PROVIDER_SITE_OTHER): Payer: Medicare Other | Admitting: Cardiovascular Disease

## 2018-01-06 ENCOUNTER — Encounter: Payer: Self-pay | Admitting: Cardiovascular Disease

## 2018-01-06 VITALS — BP 122/82 | HR 61 | Ht 61.0 in | Wt 125.0 lb

## 2018-01-06 DIAGNOSIS — I77810 Thoracic aortic ectasia: Secondary | ICD-10-CM | POA: Diagnosis not present

## 2018-01-06 DIAGNOSIS — Z8679 Personal history of other diseases of the circulatory system: Secondary | ICD-10-CM

## 2018-01-06 DIAGNOSIS — I712 Thoracic aortic aneurysm, without rupture: Secondary | ICD-10-CM

## 2018-01-06 DIAGNOSIS — E7800 Pure hypercholesterolemia, unspecified: Secondary | ICD-10-CM | POA: Diagnosis not present

## 2018-01-06 DIAGNOSIS — I480 Paroxysmal atrial fibrillation: Secondary | ICD-10-CM | POA: Diagnosis not present

## 2018-01-06 DIAGNOSIS — I7121 Aneurysm of the ascending aorta, without rupture: Secondary | ICD-10-CM

## 2018-01-06 MED ORDER — PANTOPRAZOLE SODIUM 40 MG PO TBEC: 40.0000 mg | DELAYED_RELEASE_TABLET | Freq: Every day | ORAL | 11 refills | Status: DC

## 2018-01-06 MED ORDER — METOPROLOL TARTRATE 25 MG PO TABS: 25.0000 mg | ORAL_TABLET | Freq: Two times a day (BID) | ORAL | 11 refills | Status: DC

## 2018-01-06 MED ORDER — PRAVASTATIN SODIUM 20 MG PO TABS: 20.0000 mg | ORAL_TABLET | Freq: Every evening | ORAL | 3 refills | Status: DC

## 2018-01-06 NOTE — Assessment & Plan Note (Signed)
History of aortic stenosis status post porcine AVR January 2004.  Recent 2D echo performed 11/28/2017 revealed normal LV size and function with a well-functioning aortic bioprosthesis and mild aortic insufficiency.

## 2018-01-06 NOTE — Patient Instructions (Signed)
Medication Instructions: Your physician recommends that you continue on your current medications as directed. Please refer to the Current Medication list given to you today.   Testing/Procedures: Your physician has requested that you have an echocardiogram in 1 year. Echocardiography is a painless test that uses sound waves to create images of your heart. It provides your doctor with information about the size and shape of your heart and how well your heart's chambers and valves are working. This procedure takes approximately one hour. There are no restrictions for this procedure.  Follow-Up: Your physician wants you to follow-up in: 1 year with Dr. Veleta Miners echo. You will receive a reminder letter in the mail two months in advance. If you don't receive a letter, please call our office to schedule the follow-up appointment.  If you need a refill on your cardiac medications before your next appointment, please call your pharmacy.

## 2018-01-06 NOTE — Assessment & Plan Note (Signed)
History of hyperlipidemia on statin therapy. 

## 2018-01-06 NOTE — Assessment & Plan Note (Addendum)
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 Tharon Aquas trig 07/08/2012.

## 2018-01-06 NOTE — Addendum Note (Signed)
Addended by: Therisa Doyne on: 01/06/2018 11:11 AM   Modules accepted: Orders

## 2018-01-06 NOTE — Progress Notes (Signed)
[birth year] [name] [name]   [birth year]  [id number]  Primary Physician Myer Peer, MD Primary Cardiologist: Lorretta Harp MD Lupe Carney, [place]  HPI:  [name] [name] is a 77 y.o.  thin-appearing, married Caucasian female, mother of 43, grandmother to 1 grandchild who I last saw in the office 01/03/2017. 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 Her prosthetic valve which was placed 10 years ago (January 2004) was resuspended. I had cath'd her June 18, 2012, revealing normal coronary arteries, normal LV function and normal right heart pressures. Her postop course was uncomplicated except for some minor A-fib/flutter which converted to sinus rhythm prior to discharge. She recovered nicely from her descending thoracic aortic aneurysm resection and grafting. I last saw her 11/24/15.Marland KitchenShe had 2 episodes of worrisome chest pain prior to that visitwhich were nitrate responsive. The pain radiated into her neck shoulders and back as well. A Myoview stress test performed 12/01/15 was low risk and not ischemic. A 2-D echo showed normal LV size and function, mild LVH with moderate aortic insufficiency. She's had no recurrent chest pain. She was diagnosed with gastric ulcers thought to be related to her low dose aspirin which has been discontinued. Since I saw her in the office was 2 years ago she's remained completely asymptomatic. A recent 2-D echocardiogram performed 11/28/2017 revealed normal LV systolic function with a well functioning aortic bioprosthesis and mild aortic regurgitation     Current Meds  Medication Sig  . calcium gluconate 500 MG tablet Take 1 tablet by mouth daily.  . Cholecalciferol (VITAMIN D) 2000 UNITS CAPS Take 2,000 Units by mouth 2 (two) times daily.  . fexofenadine (ALLEGRA) 180 MG tablet Take 180 mg by mouth daily.  . metoprolol tartrate (LOPRESSOR) 25 MG  tablet Take 1 tablet (25 mg total) by mouth 2 (two) times daily.  . Multiple Vitamins-Minerals (OCUVITE ADULT FORMULA) CAPS Take 1 capsule by mouth daily.  . pantoprazole (PROTONIX) 40 MG tablet Take 1 tablet (40 mg total) by mouth daily. Keep OV  . pravastatin (PRAVACHOL) 20 MG tablet Take 1 tablet (20 mg total) by mouth every evening.  . SUMAtriptan (IMITREX) 50 MG tablet Take 50 mg by mouth every 2 (two) hours as needed. For migraines     Allergies  Allergen Reactions  . Other Nausea And Vomiting    Headache also  Also from artificial sweeetners  . Codeine Nausea And Vomiting    Social History   Socioeconomic History  . Marital status: Married    Spouse name: Not on file  . Number of children: Not on file  . Years of education: Not on file  . Highest education level: Not on file  Occupational History  . Not on file  Social Needs  . Financial resource strain: Not on file  . Food insecurity:    Worry: Not on file    Inability: Not on file  . Transportation needs:    Medical: Not on file    Non-medical: Not on file  Tobacco Use  . Smoking status: Never Smoker  . Smokeless tobacco: Never Used  Substance and Sexual Activity  . Alcohol use: No  . Drug use: No  . Sexual activity: Not Currently  Lifestyle  . Physical activity:    Days per week: Not on file    Minutes per session: Not on file  . Stress: Not on file  Relationships  .  Social connections:    Talks on phone: Not on file    Gets together: Not on file    Attends religious service: Not on file    Active member of club or organization: Not on file    Attends meetings of clubs or organizations: Not on file    Relationship status: Not on file  . Intimate partner violence:    Fear of current or ex partner: Not on file    Emotionally abused: Not on file    Physically abused: Not on file    Forced sexual activity: Not on file  Other Topics Concern  . Not on file  Social History Narrative  . Not on file      Review of Systems: General: negative for chills, fever, night sweats or weight changes.  Cardiovascular: negative for chest pain, dyspnea on exertion, edema, orthopnea, palpitations, paroxysmal nocturnal dyspnea or shortness of breath Dermatological: negative for rash Respiratory: negative for cough or wheezing Urologic: negative for hematuria Abdominal: negative for nausea, vomiting, diarrhea, bright red blood per rectum, melena, or hematemesis Neurologic: negative for visual changes, syncope, or dizziness All other systems reviewed and are otherwise negative except as noted above.    Blood pressure 122/82, pulse 61, height 5\' 1"  (1.549 m), weight 125 lb (56.7 kg).  General appearance: alert and no distress Neck: no adenopathy, no carotid bruit, no JVD, supple, symmetrical, trachea midline and thyroid not enlarged, symmetric, no tenderness/mass/nodules Lungs: clear to auscultation bilaterally Heart: regular rate and rhythm, S1, S2 normal, no murmur, click, rub or gallop Extremities: extremities normal, atraumatic, no cyanosis or edema Pulses: 2+ and symmetric Skin: Skin color, texture, turgor normal. No rashes or lesions Neurologic: Alert and oriented X 3, normal strength and tone. Normal symmetric reflexes. Normal coordination and gait  EKG sinus rhythm 61 without ST or T wave changes.  I personally reviewed this EKG.  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  Recent 2D echo performed 11/28/2017 revealed normal LV size and function with a well-functioning aortic bioprosthesis and mild aortic insufficiency.  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 Tharon Aquas trig 07/08/2012.  Hyperlipidemia History of hyperlipidemia on statin therapy.      Lorretta Harp MD  FACP,FACC,FAHA, [facility] 01/06/2018 11:08 AM

## 2018-01-09 DIAGNOSIS — E039 Hypothyroidism, unspecified: Secondary | ICD-10-CM | POA: Diagnosis not present

## 2018-02-03 DIAGNOSIS — Z1231 Encounter for screening mammogram for malignant neoplasm of breast: Secondary | ICD-10-CM | POA: Diagnosis not present

## 2018-03-11 DIAGNOSIS — G44009 Cluster headache syndrome, unspecified, not intractable: Secondary | ICD-10-CM | POA: Diagnosis not present

## 2018-03-11 DIAGNOSIS — Z6824 Body mass index (BMI) 24.0-24.9, adult: Secondary | ICD-10-CM | POA: Diagnosis not present

## 2018-03-11 DIAGNOSIS — Z1331 Encounter for screening for depression: Secondary | ICD-10-CM | POA: Diagnosis not present

## 2018-03-11 DIAGNOSIS — Z1339 Encounter for screening examination for other mental health and behavioral disorders: Secondary | ICD-10-CM | POA: Diagnosis not present

## 2018-04-14 DIAGNOSIS — H26493 Other secondary cataract, bilateral: Secondary | ICD-10-CM | POA: Diagnosis not present

## 2018-07-21 DIAGNOSIS — Z23 Encounter for immunization: Secondary | ICD-10-CM | POA: Diagnosis not present

## 2018-09-01 DIAGNOSIS — J40 Bronchitis, not specified as acute or chronic: Secondary | ICD-10-CM | POA: Diagnosis not present

## 2018-09-01 DIAGNOSIS — J329 Chronic sinusitis, unspecified: Secondary | ICD-10-CM | POA: Diagnosis not present

## 2018-09-01 DIAGNOSIS — M25511 Pain in right shoulder: Secondary | ICD-10-CM | POA: Diagnosis not present

## 2018-09-01 DIAGNOSIS — Z6822 Body mass index (BMI) 22.0-22.9, adult: Secondary | ICD-10-CM | POA: Diagnosis not present

## 2018-09-21 DIAGNOSIS — J40 Bronchitis, not specified as acute or chronic: Secondary | ICD-10-CM | POA: Diagnosis not present

## 2018-09-21 DIAGNOSIS — Z1331 Encounter for screening for depression: Secondary | ICD-10-CM | POA: Diagnosis not present

## 2018-09-21 DIAGNOSIS — M19011 Primary osteoarthritis, right shoulder: Secondary | ICD-10-CM | POA: Diagnosis not present

## 2018-09-21 DIAGNOSIS — J329 Chronic sinusitis, unspecified: Secondary | ICD-10-CM | POA: Diagnosis not present

## 2018-09-29 DIAGNOSIS — R293 Abnormal posture: Secondary | ICD-10-CM | POA: Diagnosis not present

## 2018-09-29 DIAGNOSIS — M25611 Stiffness of right shoulder, not elsewhere classified: Secondary | ICD-10-CM | POA: Diagnosis not present

## 2018-09-29 DIAGNOSIS — M19011 Primary osteoarthritis, right shoulder: Secondary | ICD-10-CM | POA: Diagnosis not present

## 2018-09-29 DIAGNOSIS — M25511 Pain in right shoulder: Secondary | ICD-10-CM | POA: Diagnosis not present

## 2018-10-01 DIAGNOSIS — R293 Abnormal posture: Secondary | ICD-10-CM | POA: Diagnosis not present

## 2018-10-01 DIAGNOSIS — M19011 Primary osteoarthritis, right shoulder: Secondary | ICD-10-CM | POA: Diagnosis not present

## 2018-10-01 DIAGNOSIS — M25511 Pain in right shoulder: Secondary | ICD-10-CM | POA: Diagnosis not present

## 2018-10-01 DIAGNOSIS — M25611 Stiffness of right shoulder, not elsewhere classified: Secondary | ICD-10-CM | POA: Diagnosis not present

## 2018-10-08 DIAGNOSIS — M19011 Primary osteoarthritis, right shoulder: Secondary | ICD-10-CM | POA: Diagnosis not present

## 2018-10-08 DIAGNOSIS — R293 Abnormal posture: Secondary | ICD-10-CM | POA: Diagnosis not present

## 2018-10-08 DIAGNOSIS — M25511 Pain in right shoulder: Secondary | ICD-10-CM | POA: Diagnosis not present

## 2018-10-15 DIAGNOSIS — M25511 Pain in right shoulder: Secondary | ICD-10-CM | POA: Diagnosis not present

## 2018-10-15 DIAGNOSIS — M19011 Primary osteoarthritis, right shoulder: Secondary | ICD-10-CM | POA: Diagnosis not present

## 2018-10-15 DIAGNOSIS — R293 Abnormal posture: Secondary | ICD-10-CM | POA: Diagnosis not present

## 2018-10-21 ENCOUNTER — Other Ambulatory Visit: Payer: Self-pay | Admitting: Cardiovascular Disease

## 2018-10-22 DIAGNOSIS — R293 Abnormal posture: Secondary | ICD-10-CM | POA: Diagnosis not present

## 2018-10-22 DIAGNOSIS — M25511 Pain in right shoulder: Secondary | ICD-10-CM | POA: Diagnosis not present

## 2018-10-22 DIAGNOSIS — M19011 Primary osteoarthritis, right shoulder: Secondary | ICD-10-CM | POA: Diagnosis not present

## 2018-10-28 DIAGNOSIS — R293 Abnormal posture: Secondary | ICD-10-CM | POA: Diagnosis not present

## 2018-10-28 DIAGNOSIS — M25511 Pain in right shoulder: Secondary | ICD-10-CM | POA: Diagnosis not present

## 2018-10-28 DIAGNOSIS — M19011 Primary osteoarthritis, right shoulder: Secondary | ICD-10-CM | POA: Diagnosis not present

## 2018-11-03 DIAGNOSIS — R293 Abnormal posture: Secondary | ICD-10-CM | POA: Diagnosis not present

## 2018-11-03 DIAGNOSIS — M25511 Pain in right shoulder: Secondary | ICD-10-CM | POA: Diagnosis not present

## 2018-11-03 DIAGNOSIS — M19011 Primary osteoarthritis, right shoulder: Secondary | ICD-10-CM | POA: Diagnosis not present

## 2018-12-14 ENCOUNTER — Other Ambulatory Visit: Payer: Self-pay | Admitting: Cardiovascular Disease

## 2018-12-14 NOTE — Telephone Encounter (Signed)
Lopressor refilled. 

## 2018-12-15 ENCOUNTER — Telehealth (HOSPITAL_COMMUNITY): Payer: Self-pay | Admitting: *Deleted

## 2018-12-15 NOTE — Telephone Encounter (Signed)
COVID-19 Pre-Screening Questions:  . Do you currently have a fever? No (yes = cancel and refer to pcp for e-visit) . Have you recently travelled on a cruise, internationally, or to [place], [place], [place], [place], [place], or [place], [place] [employer]) ? No (yes = cancel, stay home, monitor symptoms, and contact pcp or initiate e-visit if symptoms develop) . Have you been in contact with someone that is currently pending confirmation of Covid19 testing or has been confirmed to have the [place] virus?  NO (yes = cancel, stay home, away from tested individual, monitor symptoms, and contact pcp or initiate e-visit if symptoms develop) . Are you currently experiencing fatigue or cough? NO (yes = pt should be prepared to have a mask placed at the time of their visit). .  . Reiterated no additional visitors. Eartha Inch no earlier than 15 minutes before appointment time. . Please bring own mask.

## 2018-12-16 ENCOUNTER — Ambulatory Visit (HOSPITAL_COMMUNITY): Payer: Medicare Other | Attending: Cardiovascular Disease

## 2018-12-16 ENCOUNTER — Other Ambulatory Visit: Payer: Self-pay

## 2018-12-16 DIAGNOSIS — I712 Thoracic aortic aneurysm, without rupture: Secondary | ICD-10-CM

## 2018-12-16 DIAGNOSIS — Z8679 Personal history of other diseases of the circulatory system: Secondary | ICD-10-CM

## 2018-12-16 DIAGNOSIS — I480 Paroxysmal atrial fibrillation: Secondary | ICD-10-CM | POA: Diagnosis not present

## 2018-12-16 DIAGNOSIS — I7121 Aneurysm of the ascending aorta, without rupture: Secondary | ICD-10-CM

## 2018-12-23 ENCOUNTER — Other Ambulatory Visit: Payer: Self-pay

## 2018-12-23 DIAGNOSIS — I712 Thoracic aortic aneurysm, without rupture: Secondary | ICD-10-CM

## 2018-12-23 DIAGNOSIS — I7121 Aneurysm of the ascending aorta, without rupture: Secondary | ICD-10-CM

## 2018-12-30 DIAGNOSIS — R200 Anesthesia of skin: Secondary | ICD-10-CM | POA: Diagnosis not present

## 2018-12-30 DIAGNOSIS — Z6821 Body mass index (BMI) 21.0-21.9, adult: Secondary | ICD-10-CM | POA: Diagnosis not present

## 2018-12-30 DIAGNOSIS — G43009 Migraine without aura, not intractable, without status migrainosus: Secondary | ICD-10-CM | POA: Diagnosis not present

## 2019-01-07 ENCOUNTER — Telehealth: Payer: Self-pay | Admitting: Cardiovascular Disease

## 2019-01-08 NOTE — Telephone Encounter (Signed)
smartphone/ my chart declined/ consent/ pre reg completed

## 2019-01-12 ENCOUNTER — Encounter: Payer: Self-pay | Admitting: Cardiovascular Disease

## 2019-01-12 ENCOUNTER — Telehealth: Payer: Self-pay

## 2019-01-12 ENCOUNTER — Telehealth (INDEPENDENT_AMBULATORY_CARE_PROVIDER_SITE_OTHER): Payer: Medicare Other | Admitting: Cardiovascular Disease

## 2019-01-12 VITALS — BP 108/65 | HR 67 | Ht 61.0 in | Wt 112.0 lb

## 2019-01-12 DIAGNOSIS — Z9889 Other specified postprocedural states: Secondary | ICD-10-CM | POA: Diagnosis not present

## 2019-01-12 DIAGNOSIS — Z952 Presence of prosthetic heart valve: Secondary | ICD-10-CM

## 2019-01-12 DIAGNOSIS — Z8679 Personal history of other diseases of the circulatory system: Secondary | ICD-10-CM | POA: Diagnosis not present

## 2019-01-12 DIAGNOSIS — I480 Paroxysmal atrial fibrillation: Secondary | ICD-10-CM | POA: Diagnosis not present

## 2019-01-12 DIAGNOSIS — I712 Thoracic aortic aneurysm, without rupture: Secondary | ICD-10-CM | POA: Diagnosis not present

## 2019-01-12 DIAGNOSIS — I7121 Aneurysm of the ascending aorta, without rupture: Secondary | ICD-10-CM

## 2019-01-12 DIAGNOSIS — E782 Mixed hyperlipidemia: Secondary | ICD-10-CM

## 2019-01-12 DIAGNOSIS — Z0389 Encounter for observation for other suspected diseases and conditions ruled out: Secondary | ICD-10-CM

## 2019-01-12 DIAGNOSIS — I77810 Thoracic aortic ectasia: Secondary | ICD-10-CM | POA: Diagnosis not present

## 2019-01-12 DIAGNOSIS — IMO0001 Reserved for inherently not codable concepts without codable children: Secondary | ICD-10-CM

## 2019-01-12 NOTE — Telephone Encounter (Signed)
Patient and/or DPR-approved person aware of 6/9 AVS instructions and verbalized understanding. Letter including After Visit Summary and any other necessary documents to be mailed to the patient's address on file.

## 2019-01-12 NOTE — Progress Notes (Signed)
Virtual Visit via Telephone Note   This visit type was conducted due to national recommendations for restrictions regarding the COVID-19 Pandemic (e.g. social distancing) in an effort to limit this patient's exposure and mitigate transmission in our community.  Due to her co-morbid illnesses, this patient is at least at moderate risk for complications without adequate follow up.  This format is felt to be most appropriate for this patient at this time.  The patient did not have access to video technology/had technical difficulties with video requiring transitioning to audio format only (telephone).  All issues noted in this document were discussed and addressed.  No physical exam could be performed with this format.  Please refer to the patient's chart for her  consent to telehealth for [facility].   Date:  01/12/2019   ID:  [name] [name], DOB [birth year], MRN [id number]  Patient Location: Home Provider Location: Home  PCP:  [name], MD  Cardiologist: Dr. Quay Burow Electrophysiologist:  None   Evaluation Performed:  Follow-Up Visit  Chief Complaint: Thoracic aortic aneurysm resection grafting and aortic valve replacement  History of Present Illness:    [name] [name] is a 78 y.o.  thin-appearing, married Caucasian female, mother of 80, grandmother to 1 grandchild whoI last saw in the office  80/11/2017. 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 Her prosthetic valve which was placed 10 years ago (January 2004) was resuspended. I had cath'd her June 18, 2012, revealing normal coronary arteries, normal LV function and normal right heart pressures. Her postop course was uncomplicated except for some minor A-fib/flutter which converted to sinus rhythm prior to discharge. She recovered nicely from her descending thoracic aortic aneurysm resection and grafting. I last saw her  11/24/15.Marland KitchenShe had 2 episodes of worrisome chest pain prior to that visitwhich were nitrate responsive. The pain radiated into her neck shoulders and back as well. A Myoview stress test performed 12/01/15 was low risk and not ischemic. A 2-D echo showed normal LV size and function, mild LVH with moderate aortic insufficiency. She's had no recurrent chest pain. She was diagnosed with gastric ulcers thought to be related to her low dose aspirin which has been discontinued.  Since I saw her a year ago her husband of 80 years unfortunately died 2018/09/04 after a prolonged hospitalization and recovery from stroke and heart attack.  She currently lives alone.  She gets occasional atypical chest pain.  She denies shortness of breath.  Recent 2D echo performed 12/16/2018 revealed normal LV systolic function with moderate aortic insufficiency and increase in her mean aortic valve gradient from 13 to 21 mmHg.  The ascending graft was visualized as well.  She is sheltering in place and socially distancing.  The patient does not have symptoms concerning for COVID-19 infection (fever, chills, cough, or new shortness of breath).    Past Medical History:  Diagnosis Date   Aortic root dilatation ([place]) 05/11/12   CTA CHEST   Arthritis    Cardiac asystole, post DCCV requiring use of temp. external pacemaker leads 07/19/2012   Dysrhythmia    GERD (gastroesophageal reflux disease)    H/O aortic valve disorder 10/04/2002   Dr. Tharon Aquas Trigt   Headache(784.0)    Heart murmur    History of migraine headaches    Hyperlipidemia    on statin therapy   Migraines    Thoracic ascending aortic aneurysm ([place])    CTA CHEST 05/11/12  Past Surgical History:  Procedure Laterality Date   ABDOMINAL HYSTERECTOMY     AORTIC VALVE REPLACEMENT  10/04/2002    Dr. 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   LEFT AND RIGHT HEART CATHETERIZATION WITH CORONARY ANGIOGRAM N/A 06/18/2012   Procedure: LEFT AND RIGHT HEART CATHETERIZATION WITH CORONARY ANGIOGRAM;  Surgeon: Lorretta Harp, MD;  Location: [facility] CATH LAB;  Service: Cardiovascular;  Laterality: N/A;   NM MYOCAR PERF WALL MOTION  10/29/2010   normal   REPLACEMENT ASCENDING AORTA  07/07/2012   Procedure: REPLACEMENT ASCENDING AORTA;  Surgeon: Ivin Poot, MD;  Location: [place];  Service: Open Heart Surgery;  Laterality: N/A;  CIRCULATORY ARREST   TEE WITHOUT CARDIOVERSION  06/18/2012   Procedure: TRANSESOPHAGEAL ECHOCARDIOGRAM (TEE);  Surgeon: Sanda Klein, MD;  Location: [facility] ENDOSCOPY;  Service: Cardiovascular;  Laterality: N/A;  right and left heart cath after this procedure   TEE WITHOUT CARDIOVERSION  07/15/2012   Procedure: TRANSESOPHAGEAL ECHOCARDIOGRAM (TEE);  Surgeon: Pixie Casino, MD;  Location: [facility] ENDOSCOPY;  Service: Cardiovascular;  Laterality: N/A;   TONSILLECTOMY       No outpatient medications have been marked as taking for the 01/12/19 encounter (Appointment) with Lorretta Harp, MD.     Allergies:   Other and Codeine   Social History   Tobacco Use   Smoking status: Never Smoker   Smokeless tobacco: Never Used  Substance Use Topics   Alcohol use: No   Drug use: No     Family Hx: The patient's family history is not on file.  ROS:   Please see the history of present illness.     All other systems reviewed and are negative.   Prior CV studies:   The following studies were reviewed today:  2D echocardiogram performed 12/16/2018  Labs/Other Tests and Data Reviewed:    EKG:  No ECG reviewed.  Recent Labs: No results found for requested labs within last 8760 hours.   Recent Lipid Panel No results found for: CHOL, TRIG, HDL, CHOLHDL, LDLCALC, LDLDIRECT  Wt Readings from Last 3  Encounters:  01/06/18 125 lb (56.7 kg)  01/03/17 128 lb (58.1 kg)  04/17/16 124 lb 12.8 oz (56.6 kg)     Objective:    Vital Signs:  There were no vitals taken for this visit.   VITAL SIGNS:  reviewed a complete physical exam was not performed today since this was a virtual telemedicine phone visit  ASSESSMENT & PLAN:    1. Aortic valve replacement- history of initial aortic valve replacement in 2004 with a bioprosthetic valve with resuspension by Dr. Darcey Nora 07/08/2012 during resection and grafting of a thoracic aortic aneurysm.  Her last 2D echo performed 12/16/2018 revealed normal LV function with moderate aortic insufficiency and an increase in aortic valve gradient from a mean of 13 to 21 mmHg. 2. Thoracic aortic aneurysm- history of thoracic aortic aneurysm resection and grafting by Dr. Darcey Nora 07/08/2012 3. Hyperlipidemia- history of hyperlipidemia on pravastatin with lipid profile performed 12/22/2017 revealing a total cholesterol 158, LDL of 80 and HDL 41.  COVID-19 Education: The signs and symptoms of COVID-19 were discussed with the patient and how to seek care for testing (follow up with PCP or arrange E-visit).  The importance  of social distancing was discussed today.  Time:   Today, I have spent 7 minutes with the patient with telehealth technology discussing the above problems.     Medication Adjustments/Labs and Tests Ordered: Current medicines are reviewed at length with the patient today.  Concerns regarding medicines are outlined above.   Tests Ordered: No orders of the defined types were placed in this encounter.   Medication Changes: No orders of the defined types were placed in this encounter.   Disposition:  Follow up in 1 year(s)  Signed, Quay Burow, MD  01/12/2019 7:36 AM    [place]

## 2019-01-12 NOTE — Patient Instructions (Signed)
Medication Instructions:  Your physician recommends that you continue on your current medications as directed. Please refer to the Current Medication list given to you today.  If you need a refill on your cardiac medications before your next appointment, please call your pharmacy.   Lab work: NONE If you have labs (blood work) drawn today and your tests are completely normal, you will receive your results only by: [name] MyChart Message (if you have MyChart) OR . A paper copy in the mail If you have any lab test that is abnormal or we need to change your treatment, we will call you to review the results.  Testing/Procedures: Your physician has requested that you have an echocardiogram. Echocardiography is a painless test that uses sound waves to create images of your heart. It provides your doctor with information about the size and shape of your heart and how well your heart's chambers and valves are working. This procedure takes approximately one hour. There are no restrictions for this procedure. LOCATION: [place], [place] [address] TO BE SCHEDULED FOR APPROXIMATELY 12 MONTHS FROM TODAY. YOU WILL BE CONTACTED BY A SCHEDULER TO SET UP THIS APPOINTMENT.    Follow-Up: At [facility] you and your health needs are our priority.  As part of our continuing mission to provide you with exceptional heart care, we have created designated Provider Care Teams.  These Care Teams include your primary Cardiologist (physician) and Advanced Practice Providers (APPs -  Physician Assistants and Nurse Practitioners) who all work together to provide you with the care you need, when you need it. You will need a follow up appointment in 12 months WITH DR. [name]. Please call our office 2 months in advance to schedule this appointment.  PLEASE HAVE YOUR ECHOCARDIOGRAM DONE BEFORE THIS APPOINTMENT.

## 2019-01-21 ENCOUNTER — Other Ambulatory Visit: Payer: Self-pay | Admitting: Cardiovascular Disease

## 2019-02-15 ENCOUNTER — Other Ambulatory Visit: Payer: Self-pay | Admitting: Cardiovascular Disease

## 2019-02-23 DIAGNOSIS — L821 Other seborrheic keratosis: Secondary | ICD-10-CM | POA: Diagnosis not present

## 2019-02-23 DIAGNOSIS — L578 Other skin changes due to chronic exposure to nonionizing radiation: Secondary | ICD-10-CM | POA: Diagnosis not present

## 2019-03-01 DIAGNOSIS — N183 Chronic kidney disease, stage 3 (moderate): Secondary | ICD-10-CM | POA: Diagnosis not present

## 2019-03-01 DIAGNOSIS — Z13820 Encounter for screening for osteoporosis: Secondary | ICD-10-CM | POA: Diagnosis not present

## 2019-03-01 DIAGNOSIS — E782 Mixed hyperlipidemia: Secondary | ICD-10-CM | POA: Diagnosis not present

## 2019-03-01 DIAGNOSIS — Z0000 Encounter for general adult medical examination without abnormal findings: Secondary | ICD-10-CM | POA: Diagnosis not present

## 2019-03-01 DIAGNOSIS — E039 Hypothyroidism, unspecified: Secondary | ICD-10-CM | POA: Diagnosis not present

## 2019-03-01 DIAGNOSIS — Z79899 Other long term (current) drug therapy: Secondary | ICD-10-CM | POA: Diagnosis not present

## 2019-04-05 DIAGNOSIS — E785 Hyperlipidemia, unspecified: Secondary | ICD-10-CM | POA: Diagnosis not present

## 2019-04-05 DIAGNOSIS — E039 Hypothyroidism, unspecified: Secondary | ICD-10-CM | POA: Diagnosis not present

## 2019-04-19 DIAGNOSIS — H26493 Other secondary cataract, bilateral: Secondary | ICD-10-CM | POA: Diagnosis not present

## 2019-04-29 DIAGNOSIS — L219 Seborrheic dermatitis, unspecified: Secondary | ICD-10-CM | POA: Diagnosis not present

## 2019-04-29 DIAGNOSIS — B079 Viral wart, unspecified: Secondary | ICD-10-CM | POA: Diagnosis not present

## 2019-05-18 DIAGNOSIS — Z23 Encounter for immunization: Secondary | ICD-10-CM | POA: Diagnosis not present

## 2019-05-18 DIAGNOSIS — M810 Age-related osteoporosis without current pathological fracture: Secondary | ICD-10-CM | POA: Diagnosis not present

## 2019-05-18 DIAGNOSIS — Z6821 Body mass index (BMI) 21.0-21.9, adult: Secondary | ICD-10-CM | POA: Diagnosis not present

## 2019-05-18 DIAGNOSIS — E782 Mixed hyperlipidemia: Secondary | ICD-10-CM | POA: Diagnosis not present

## 2019-06-23 DIAGNOSIS — J029 Acute pharyngitis, unspecified: Secondary | ICD-10-CM | POA: Diagnosis not present

## 2019-06-23 DIAGNOSIS — J309 Allergic rhinitis, unspecified: Secondary | ICD-10-CM | POA: Diagnosis not present

## 2019-06-23 DIAGNOSIS — Z20828 Contact with and (suspected) exposure to other viral communicable diseases: Secondary | ICD-10-CM | POA: Diagnosis not present

## 2019-10-06 DIAGNOSIS — Z20828 Contact with and (suspected) exposure to other viral communicable diseases: Secondary | ICD-10-CM | POA: Diagnosis not present

## 2019-10-06 DIAGNOSIS — M791 Myalgia, unspecified site: Secondary | ICD-10-CM | POA: Diagnosis not present

## 2019-10-06 DIAGNOSIS — R0602 Shortness of breath: Secondary | ICD-10-CM | POA: Diagnosis not present

## 2020-01-19 ENCOUNTER — Ambulatory Visit (HOSPITAL_COMMUNITY): Payer: Medicare Other | Attending: Cardiovascular Disease

## 2020-01-19 ENCOUNTER — Other Ambulatory Visit: Payer: Self-pay

## 2020-01-19 DIAGNOSIS — Z952 Presence of prosthetic heart valve: Secondary | ICD-10-CM | POA: Insufficient documentation

## 2020-01-20 ENCOUNTER — Telehealth: Payer: Self-pay | Admitting: Cardiovascular Disease

## 2020-01-20 NOTE — Telephone Encounter (Signed)
[name] [name] is calling requesting her son attend her upcoming appointment scheduled for 01/25/20 due to her having a 30 mile drive and being 79 years old. Please advise.

## 2020-01-20 NOTE — Telephone Encounter (Signed)
Called patient, advised it was okay for son to come to appointment .  Patient verbalized understanding.

## 2020-01-25 ENCOUNTER — Ambulatory Visit (INDEPENDENT_AMBULATORY_CARE_PROVIDER_SITE_OTHER): Payer: Medicare Other | Admitting: Cardiovascular Disease

## 2020-01-25 ENCOUNTER — Encounter: Payer: Self-pay | Admitting: Cardiovascular Disease

## 2020-01-25 ENCOUNTER — Other Ambulatory Visit: Payer: Self-pay

## 2020-01-25 VITALS — BP 140/84 | HR 56 | Ht 61.0 in | Wt 122.0 lb

## 2020-01-25 DIAGNOSIS — Z952 Presence of prosthetic heart valve: Secondary | ICD-10-CM

## 2020-01-25 DIAGNOSIS — E782 Mixed hyperlipidemia: Secondary | ICD-10-CM | POA: Diagnosis not present

## 2020-01-25 DIAGNOSIS — Z8679 Personal history of other diseases of the circulatory system: Secondary | ICD-10-CM

## 2020-01-25 DIAGNOSIS — I77810 Thoracic aortic ectasia: Secondary | ICD-10-CM

## 2020-01-25 DIAGNOSIS — I712 Thoracic aortic aneurysm, without rupture: Secondary | ICD-10-CM

## 2020-01-25 DIAGNOSIS — I7121 Aneurysm of the ascending aorta, without rupture: Secondary | ICD-10-CM

## 2020-01-25 NOTE — Assessment & Plan Note (Signed)
History of thoracic aortic aneurysm resection and grafting with aortic valve replacement using a prosthetic valve January 2004.  I did perform cardiac catheterization on her 06/18/2012 revealing normal coronary arteries.  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  She is completely asymptomatic.  I am going to recheck a 2D echo in 6 months and see her back after that.

## 2020-01-25 NOTE — Assessment & Plan Note (Signed)
History of hyperlipidemia on statin therapy with lipid profile performed 03/01/2019 revealing total cholesterol of 59, LDL 87 HDL 50.

## 2020-01-25 NOTE — Patient Instructions (Signed)
Your physician recommends that you continue on your current medications as directed. Please refer to the Current Medication list given to you today.  *If you need a refill on your cardiac medications before your next appointment, please call your pharmacy*  Testing/Procedures: Your physician has requested that you have an echocardiogram in 6 MONTHS. Echocardiography is a painless test that uses sound waves to create images of your heart. It provides your doctor with information about the size and shape of your heart and how well your hearts chambers and valves are working. This procedure takes approximately one hour. There are no restrictions for this procedure.  Follow-Up: At [facility], you and your health needs are our priority.  As part of our continuing mission to provide you with exceptional heart care, we have created designated Provider Care Teams.  These Care Teams include your primary Cardiologist (physician) and Advanced Practice Providers (APPs -  Physician Assistants and Nurse Practitioners) who all work together to provide you with the care you need, when you need it.  We recommend signing up for the patient portal called "MyChart".  Sign up information is provided on this After Visit Summary.  MyChart is used to connect with patients for Virtual Visits (Telemedicine).  Patients are able to view lab/test results, encounter notes, upcoming appointments, etc.  Non-urgent messages can be sent to your provider as well.   To learn more about what you can do with MyChart, go to [email or web address].    Your next appointment:   6 month(s) (after echo completed)  The format for your next appointment:   In Person  Provider:   Quay Burow, MD

## 2020-01-25 NOTE — Progress Notes (Signed)
1/61/0960 [name] [name]   [birth year]  [id number]  Primary Physician [name], MD Primary Cardiologist: Lorretta Harp MD Lupe Car[place], [place]  HPI:  [name] [name] is a 79 y.o.  thin-appearing, married Caucasian female, mother of 66, grandmother to 1 grandchild whoI last spoke to for a virtual telemedicine phone visit 01/12/2019.  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 Her prosthetic valve which was placed 10 years ago (January 2004) was resuspended. I had cath'd her June 18, 2012, revealing normal coronary arteries, normal LV function and normal right heart pressures. Her postop course was uncomplicated except for some minor A-fib/flutter which converted to sinus rhythm prior to discharge. She recovered nicely from her descending thoracic aortic aneurysm resection and grafting. I last saw her 11/24/15.Marland KitchenShe had 2 episodes of worrisome chest pain prior to that visitwhich were nitrate responsive. The pain radiated into her neck shoulders and back as well. A Myoview stress test performed 12/01/15 was low risk and not ischemic. A 2-D echo showed normal LV size and function, mild LVH with moderate aortic insufficiency. She's had no recurrent chest pain. She was diagnosed with gastric ulcers thought to be related to her low dose aspirin which has been discontinued.  Her husband of 8 years unfortunately died 08-19-18 after a prolonged hospitalization and recovery from stroke and heart attack.  She currently lives alone.  She gets occasional atypical chest pain.  She denies shortness of breath.  2D echo performed 12/16/2018 revealed normal LV systolic function with moderate aortic insufficiency and increase in her mean aortic valve gradient from 13 to 21 mmHg.  The ascending graft was visualized as well.  Since I spoke to her a year ago she continues to do well.  She walks several  miles a day without limitation.  She denies chest pain or shortness of breath.  She did have a recent 2D echo performed 01/19/2020 revealing severe central aortic insufficiency with slightly increased gradient secondary to this but this was related to the valve leaflet degeneration.  Her valve was placed 17 years ago.  Her LV size and function were normal however.   Current Meds  Medication Sig  . calcium gluconate 500 MG tablet Take 1 tablet by mouth daily.  . Cholecalciferol (VITAMIN D) 2000 UNITS CAPS Take 2,000 Units by mouth 2 (two) times daily.  . metoprolol tartrate (LOPRESSOR) 25 MG tablet TAKE ONE TABLET BY MOUTH TWICE DAILY  . Multiple Vitamins-Minerals (OCUVITE ADULT FORMULA) CAPS Take 1 capsule by mouth daily.  . pantoprazole (PROTONIX) 40 MG tablet Take 1 tablet (40 mg total) by mouth daily.  . pravastatin (PRAVACHOL) 20 MG tablet Take 1 tablet (20 mg total) by mouth every evening.  . SUMAtriptan (IMITREX) 50 MG tablet Take 50 mg by mouth every 2 (two) hours as needed. For migraines  . [DISCONTINUED] fexofenadine (ALLEGRA) 180 MG tablet Take 180 mg by mouth daily.     Allergies  Allergen Reactions  . Other Nausea And Vomiting    Headache also  Also from artificial sweeetners  . Codeine Nausea And Vomiting    Social History   Socioeconomic History  . Marital status: Married    Spouse name: Not on file  . Number of children: Not on file  . Years of education: Not on file  . Highest education level: Not on file  Occupational History  . Not on file  Tobacco Use  .  Smoking status: Never Smoker  . Smokeless tobacco: Never Used  Substance and Sexual Activity  . Alcohol use: No  . Drug use: No  . Sexual activity: Not Currently  Other Topics Concern  . Not on file  Social History Narrative  . Not on file   Social Determinants of Health   Financial Resource Strain:   . Difficulty of Paying Living Expenses:   Food Insecurity:   . Worried About Charity fundraiser in  the Last Year:   . Arboriculturist in the Last Year:   Transportation Needs:   . Film/video editor (Medical):   [name] Lack of Transportation (Non-Medical):   Physical Activity:   . Days of Exercise per Week:   . Minutes of Exercise per Session:   Stress:   . Feeling of Stress :   Social Connections:   . Frequency of Communication with Friends and Family:   . Frequency of Social Gatherings with Friends and Family:   . Attends Religious Services:   . Active Member of Clubs or Organizations:   . Attends Archivist Meetings:   [name] Marital Status:   Intimate Partner Violence:   . Fear of Current or Ex-Partner:   . Emotionally Abused:   [name] Physically Abused:   . Sexually Abused:      Review of Systems: General: negative for chills, fever, night sweats or weight changes.  Cardiovascular: negative for chest pain, dyspnea on exertion, edema, orthopnea, palpitations, paroxysmal nocturnal dyspnea or shortness of breath Dermatological: negative for rash Respiratory: negative for cough or wheezing Urologic: negative for hematuria Abdominal: negative for nausea, vomiting, diarrhea, bright red blood per rectum, melena, or hematemesis Neurologic: negative for visual changes, syncope, or dizziness All other systems reviewed and are otherwise negative except as noted above.    Blood pressure 140/84, pulse (!) 56, height 5\' 1"  (1.549 m), weight 122 lb (55.3 kg), SpO2 97 %.  General appearance: alert and no distress Neck: no adenopathy, no carotid bruit, no JVD, supple, symmetrical, trachea midline and thyroid not enlarged, symmetric, no tenderness/mass/nodules Lungs: clear to auscultation bilaterally Heart: 2/6 outflow tract murmur consistent with aortic stenosis and 2/6 diastolic murmur consistent with aortic regurgitation. Extremities: extremities normal, atraumatic, no cyanosis or edema Pulses: 2+ and symmetric Skin: Skin color, texture, turgor normal. No rashes or  lesions Neurologic: Alert and oriented X 3, normal strength and tone. Normal symmetric reflexes. Normal coordination and gait  EKG sinus bradycardia 56 with voltage criteria for LVH.  I personally reviewed this EKG.  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  I did perform cardiac catheterization on her 06/18/2012 revealing normal coronary arteries.  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  She is completely asymptomatic.  I am going to recheck a 2D echo in 6 months and see her back after that.  Aortic root dilatation [facility]) Status post thoracic aortic aneurysm resection and grafting back in 2004 along with aortic valve replacement the setting of thoracic aortic aneurysm.  Recent echo performed 01/19/2020 revealed normal aortic root dimensions.  Hyperlipidemia History of hyperlipidemia on statin therapy with lipid profile performed 03/01/2019 revealing total cholesterol of 59, LDL 87 HDL 50.      Lorretta Harp MD FACP,FACC,FAHA, [facility]  01/25/2020 11:58 AM

## 2020-01-25 NOTE — Assessment & Plan Note (Signed)
Status post thoracic aortic aneurysm resection and grafting back in 2004 along with aortic valve replacement the setting of thoracic aortic aneurysm.  Recent echo performed 01/19/2020 revealed normal aortic root dimensions.

## 2020-02-21 ENCOUNTER — Other Ambulatory Visit: Payer: Self-pay | Admitting: Cardiovascular Disease

## 2020-02-24 DIAGNOSIS — L821 Other seborrheic keratosis: Secondary | ICD-10-CM | POA: Diagnosis not present

## 2020-02-24 DIAGNOSIS — L578 Other skin changes due to chronic exposure to nonionizing radiation: Secondary | ICD-10-CM | POA: Diagnosis not present

## 2020-03-03 DIAGNOSIS — M810 Age-related osteoporosis without current pathological fracture: Secondary | ICD-10-CM | POA: Diagnosis not present

## 2020-03-03 DIAGNOSIS — Z9181 History of falling: Secondary | ICD-10-CM | POA: Diagnosis not present

## 2020-03-03 DIAGNOSIS — E782 Mixed hyperlipidemia: Secondary | ICD-10-CM | POA: Diagnosis not present

## 2020-03-03 DIAGNOSIS — E039 Hypothyroidism, unspecified: Secondary | ICD-10-CM | POA: Diagnosis not present

## 2020-03-03 DIAGNOSIS — Z0000 Encounter for general adult medical examination without abnormal findings: Secondary | ICD-10-CM | POA: Diagnosis not present

## 2020-03-03 DIAGNOSIS — Z1331 Encounter for screening for depression: Secondary | ICD-10-CM | POA: Diagnosis not present

## 2020-03-03 DIAGNOSIS — J309 Allergic rhinitis, unspecified: Secondary | ICD-10-CM | POA: Diagnosis not present

## 2020-03-03 DIAGNOSIS — Z79899 Other long term (current) drug therapy: Secondary | ICD-10-CM | POA: Diagnosis not present

## 2020-04-13 DIAGNOSIS — M810 Age-related osteoporosis without current pathological fracture: Secondary | ICD-10-CM | POA: Diagnosis not present

## 2020-04-20 DIAGNOSIS — H26493 Other secondary cataract, bilateral: Secondary | ICD-10-CM | POA: Diagnosis not present

## 2020-05-15 ENCOUNTER — Encounter (HOSPITAL_COMMUNITY): Payer: Self-pay | Admitting: Physician Assistant

## 2020-05-15 ENCOUNTER — Ambulatory Visit (HOSPITAL_COMMUNITY)
Admission: RE | Admit: 2020-05-15 | Discharge: 2020-05-15 | Disposition: A | Discharge status: Home / Self Care | Payer: Medicare Other | Source: Ambulatory Visit | Attending: Physician Assistant | Admitting: Physician Assistant

## 2020-05-15 ENCOUNTER — Other Ambulatory Visit: Payer: Self-pay

## 2020-05-15 ENCOUNTER — Telehealth: Payer: Self-pay | Admitting: Cardiovascular Disease

## 2020-05-15 VITALS — BP 118/70 | HR 116 | Ht 61.0 in | Wt 121.0 lb

## 2020-05-15 DIAGNOSIS — I480 Paroxysmal atrial fibrillation: Secondary | ICD-10-CM | POA: Diagnosis not present

## 2020-05-15 DIAGNOSIS — Z7901 Long term (current) use of anticoagulants: Secondary | ICD-10-CM | POA: Diagnosis not present

## 2020-05-15 DIAGNOSIS — K219 Gastro-esophageal reflux disease without esophagitis: Secondary | ICD-10-CM | POA: Diagnosis not present

## 2020-05-15 DIAGNOSIS — D6869 Other thrombophilia: Secondary | ICD-10-CM

## 2020-05-15 DIAGNOSIS — R011 Cardiac murmur, unspecified: Secondary | ICD-10-CM | POA: Insufficient documentation

## 2020-05-15 DIAGNOSIS — E785 Hyperlipidemia, unspecified: Secondary | ICD-10-CM | POA: Insufficient documentation

## 2020-05-15 DIAGNOSIS — Z79899 Other long term (current) drug therapy: Secondary | ICD-10-CM | POA: Diagnosis not present

## 2020-05-15 LAB — CBC
HCT: 35.6 % — ABNORMAL LOW (ref 36.0–46.0)
Hemoglobin: 11.4 g/dL — ABNORMAL LOW (ref 12.0–15.0)
MCH: 31.6 pg (ref 26.0–34.0)
MCHC: 32 g/dL (ref 30.0–36.0)
MCV: 98.6 fL (ref 80.0–100.0)
Platelets: 192 10*3/uL (ref 150–400)
RBC: 3.61 MIL/uL — ABNORMAL LOW (ref 3.87–5.11)
RDW: 12.9 % (ref 11.5–15.5)
WBC: 6.3 10*3/uL (ref 4.0–10.5)
nRBC: 0 % (ref 0.0–0.2)

## 2020-05-15 LAB — BASIC METABOLIC PANEL
Anion gap: 8 (ref 5–15)
BUN: 17 mg/dL (ref 8–23)
CO2: 24 mmol/L (ref 22–32)
Calcium: 9.5 mg/dL (ref 8.9–10.3)
Chloride: 107 mmol/L (ref 98–111)
Creatinine, Ser: 1.45 mg/dL — ABNORMAL HIGH (ref 0.44–1.00)
GFR, Estimated: 34 mL/min — ABNORMAL LOW (ref >60)
Glucose, Bld: 128 mg/dL — ABNORMAL HIGH (ref 70–99)
Potassium: 4.1 mmol/L (ref 3.5–5.1)
Sodium: 139 mmol/L (ref 135–145)

## 2020-05-15 LAB — TSH: TSH: 1.971 u[IU]/mL (ref 0.350–4.500)

## 2020-05-15 MED ORDER — APIXABAN 2.5 MG PO TABS: 2.5000 mg | ORAL_TABLET | Freq: Two times a day (BID) | ORAL | 3 refills | Status: DC

## 2020-05-15 MED ORDER — METOPROLOL TARTRATE 25 MG PO TABS: 37.5000 mg | ORAL_TABLET | Freq: Two times a day (BID) | ORAL | 3 refills | Status: DC

## 2020-05-15 NOTE — Telephone Encounter (Signed)
Spoke with pt, she reports elevated heart rate that started yesterday morning. She reports her pulse feels weak and irregular. Her heart rate has been 115 to 132 bpm. She feels a fluttering in her chest that is similar to when she had atrial fib post op. She feels better today after drinking plenty of water but her heart rate is still elevated. She will see the atrial fib clinic today at 2:30 pm.

## 2020-05-15 NOTE — Progress Notes (Signed)
Primary Care Physician: [name], MD Primary Cardiologist: Dr [name] Primary Electrophysiologist: none Referring Physician: [employer] triage/Dr Viviana Simpler Gruen is a 79 y.o. female with a history of thoracic aortic aneurysm and AS s/p repair 2004, paroxysmal atrial fibrillation who presents for consultation in the [facility]. The patient was initially diagnosed with atrial fibrillation in 2004 in the setting of her valve surgery. She had done well since that time with no known recurrence of afib. Patient has a CHADS2VASC score of 3. Patient was in her usual state of health until 05/14/20 when she began having heart "fluttering." These symptoms felt similar to her palpitations after her surgery. ECG today shows afib with RVR. There were no triggers that she could identify.   Today, she denies symptoms of chest pain, shortness of breath, orthopnea, PND, lower extremity edema, dizziness, presyncope, syncope, snoring, daytime somnolence, bleeding, or neurologic sequela. The patient is tolerating medications without difficulties and is otherwise without complaint today.    Atrial Fibrillation Risk Factors:  she does not have symptoms or diagnosis of sleep apnea. she does not have a history of rheumatic fever. she does not have a history of alcohol use.   she has a BMI of Body mass index is 22.86 kg/m.[name] Filed Weights   05/15/20 1438  Weight: 54.9 kg    No family history on file.   Atrial Fibrillation Management history:  Previous antiarrhythmic drugs: amiodarone Previous cardioversions: none Previous ablations: none CHADS2VASC score: 3 Anticoagulation history: none   Past Medical History:  Diagnosis Date  . Aortic root dilatation ([place]) 05/11/12   CTA CHEST  . Arthritis   . 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 Dysrhythmia   . GERD (gastroesophageal reflux disease)   . H/O aortic  valve disorder 10/04/2002   Dr. Tharon Aquas Trigt  . Headache(784.0)   . Heart murmur   . History of migraine headaches   . Hyperlipidemia    on statin therapy  . Migraines   . Thoracic ascending aortic aneurysm [facility])    CTA CHEST 05/11/12   Past Surgical History:  Procedure Laterality Date  . ABDOMINAL HYSTERECTOMY    . AORTIC VALVE REPLACEMENT  10/04/2002    Dr. Tharon Aquas Trigt    #21 pericardial stented valve  . CARDIOVERSION  07/15/2012   Procedure: CARDIOVERSION;  Surgeon: Pixie Casino, MD;  Location: [facility] ENDOSCOPY;  Service: Cardiovascular;  Laterality: N/A;  . 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 LEFT AND RIGHT HEART CATHETERIZATION WITH CORONARY ANGIOGRAM N/A 06/18/2012   Procedure: LEFT AND RIGHT HEART CATHETERIZATION WITH CORONARY ANGIOGRAM;  Surgeon: Lorretta Harp, MD;  Location: [facility] CATH LAB;  Service: Cardiovascular;  Laterality: N/A;  . NM MYOCAR PERF WALL MOTION  10/29/2010   normal  . REPLACEMENT ASCENDING AORTA  07/07/2012   Procedure: REPLACEMENT ASCENDING AORTA;  Surgeon: Ivin Poot, MD;  Location: [place];  Service: Open Heart Surgery;  Laterality: N/A;  CIRCULATORY ARREST  . TEE WITHOUT CARDIOVERSION  06/18/2012   Procedure: TRANSESOPHAGEAL ECHOCARDIOGRAM (TEE);  Surgeon: Sanda Klein, MD;  Location[facility] ENDOSCOPY;  Service: Cardiovascular;  Laterality: N/A;  right and left heart cath after this procedure  . TEE WITHOUT CARDIOVERSION  07/15/2012   Procedure: TRANSESOPHAGEAL ECHOCARDIOGRAM (TEE);  Surgeon: Pixie Casino, MD;  Location: [facility] ENDOSCOPY;  Service: Cardiovascular;  Laterality: N/A;  . TONSILLECTOMY  Current Outpatient Medications  Medication Sig Dispense Refill  . budesonide (RHINOCORT AQUA) 32 MCG/ACT nasal spray Place 1 spray into both nostrils daily.    . calcium gluconate 500 MG tablet Take 1 tablet by mouth daily.    . Cholecalciferol (VITAMIN D) 2000 UNITS CAPS Take 2,000 Units by mouth 2 (two) times daily.    .  metoprolol tartrate (LOPRESSOR) 25 MG tablet Take 1.5 tablets (37.5 mg total) by mouth 2 (two) times daily. 180 tablet 3  . Multiple Vitamins-Minerals (OCUVITE ADULT FORMULA) CAPS Take 1 capsule by mouth daily.    . pantoprazole (PROTONIX) 40 MG tablet TAKE ONE TABLET BY MOUTH ONCE DAILY BEFORE meal of choice 90 tablet 3  . pravastatin (PRAVACHOL) 20 MG tablet Take 1 tablet (20 mg total) by mouth every evening. 90 tablet 3  . SUMAtriptan (IMITREX) 50 MG tablet Take 50 mg by mouth every 2 (two) hours as needed. For migraines    . apixaban (ELIQUIS) 2.5 MG TABS tablet Take 1 tablet (2.5 mg total) by mouth 2 (two) times daily. 60 tablet 3   No current facility-administered medications for this encounter.    Allergies  Allergen Reactions  . Other Nausea And Vomiting    Headache also  Also from artificial sweeetners  . Codeine Nausea And Vomiting    Social History   Socioeconomic History  . Marital status: Married    Spouse name: Not on file  . Number of children: Not on file  . Years of education: Not on file  . Highest education level: Not on file  Occupational History  . Not on file  Tobacco Use  . Smoking status: Never Smoker  . Smokeless tobacco: Never Used  Substance and Sexual Activity  . Alcohol use: No  . Drug use: No  . Sexual activity: Not Currently  Other Topics Concern  . Not on file  Social History Narrative  . Not on file   Social Determinants of Health   Financial Resource Strain:   . Difficulty of Paying Living Expenses: Not on file  Food Insecurity:   . Worried About Charity fundraiser in the Last Year: Not on file  . Ran Out of Food in the Last Year: Not on file  Transportation Needs:   . Lack of Transportation (Medical): Not on file  . Lack of Transportation (Non-Medical): Not on file  Physical Activity:   . Days of Exercise per Week: Not on file  . Minutes of Exercise per Session: Not on file  Stress:   . Feeling of Stress : Not on file  Social  Connections:   . Frequency of Communication with Friends and Family: Not on file  . Frequency of Social Gatherings with Friends and Family: Not on file  . Attends Religious Services: Not on file  . Active Member of Clubs or Organizations: Not on file  . Attends Archivist Meetings: Not on file  . Marital Status: Not on file  Intimate Partner Violence:   . Fear of Current or Ex-Partner: Not on file  . Emotionally Abused: Not on file  . Physically Abused: Not on file  . Sexually Abused: Not on file     ROS- All systems are reviewed and negative except as per the HPI above.  Physical Exam: Vitals:   05/15/20 1438  BP: 118/70  Pulse: (!) 116  Weight: 54.9 kg  Height: 5\' 1"  (1.549 m)    GEN- The patient is well appearing elderly female, alert  and oriented x 3 today.   Head- normocephalic, atraumatic Eyes-  Sclera clear, conjunctiva pink Ears- hearing intact Oropharynx- clear Neck- supple  Lungs- Clear to ausculation bilaterally, normal work of breathing Heart- irregular rate and rhythm, no rubs or gallops, 2/6 diastolic murmur GI- soft, NT, ND, + BS Extremities- no clubbing, cyanosis, or edema MS- no significant deformity or atrophy Skin- no rash or lesion Psych- euthymic mood, full affect Neuro- strength and sensation are intact  Wt Readings from Last 3 Encounters:  05/15/20 54.9 kg  01/25/20 55.3 kg  01/12/19 50.8 kg    EKG today demonstrates atrial flutter with variable block HR 116, QRS 94, QTc 522  Echo 01/19/20 demonstrated  1. 21 mm bioprosthetic aortic valve per EMR review. Vmax 3.2 m/s, MG 24  mmHG, EOA 1.12 cm2, DI 0.26, The AT is <100 msec. There is at least  moderate regurgitation (possibly severe) that is central and related to  leaflet deteroriation (valve leaflets are  visually thickened and calcified) of this prosthetic valve that was  implanted in 2004. The jet is anteriorly/centrally directed. No  paravalvular leak. Aortic flow poorly  evaluated. The increased gradients  are related to worsening regurgitaiton of the  prosthetic valve and not related to stenosis (AT <100 msec). There was an increase in gradients between 2019 and 2020 and it appears worsening regurgitation was the issue. LV function is normal, and the cavity is not dilated (LVEDD 45 mm; LVESD 30 mm).  Would recommend a TEE for better quantification of the regurgitation,  especially if symptoms are present. The aortic valve has been  repaired/replaced. Aortic valve regurgitation is moderate to severe.  Procedure Date: 08/10/2002.  2. Left ventricular ejection fraction, by estimation, is 60 to 65%. The  left ventricle has normal function. The left ventricle has no regional  wall motion abnormalities. There is mild concentric left ventricular  hypertrophy. Left ventricular diastolic  function could not be evaluated.  3. Right ventricular systolic function is mildly reduced. The right  ventricular size is normal. There is normal pulmonary artery systolic  pressure.  4. The mitral valve is grossly normal. Mild mitral valve regurgitation.  No evidence of mitral stenosis.  5. 30-mm Dacron Hemashield graft repair of ascending aorta 07/08/2012.  Aortic root/ascending aorta has been repaired/replaced.  6. The inferior vena cava is normal in size with greater than 50%  respiratory variability, suggesting right atrial pressure of 3 mmHg.   Comparison(s): Changes from prior study are noted.   Epic records are reviewed at length today  CHA2DS2-VASc Score = 3  The patient's score is based upon: CHF History: 0 HTN History: 0 Diabetes History: 0 Stroke History: 0 Vascular Disease History: 0 Age Score: 2 Gender Score: 1      ASSESSMENT AND PLAN: 1. Paroxysmal Atrial Fibrillation (ICD10:  I48.0) The patient's CHA2DS2-VASc score is 3, indicating a 3.2% annual risk of stroke.   General education about afib provided and questions answered. We also discussed her  stroke risk and the risks and benefits of anticoagulation. Will plan to start Eliquis 2.5 mg BID. (age, weight) Will increase Lopressor to 37.5 mg BID Consider DCCV after 3 weeks of uninterrupted anticoagulation.   2. Secondary Hypercoagulable State (ICD10:  D68.69) The patient is at significant risk for stroke/thromboembolism based upon her CHA2DS2-VASc Score of 3.  Start Apixaban (Eliquis).   3. AS/Aortic root dilatation Bioprosthetic valve 2004. Moderate to severe on recent echo Repeat echo pending Followed by Dr [name].   Follow up  in the AF clinic in one week.    [facility] [address] [place],  [address] [phone] 05/15/2020 4:34 PM

## 2020-05-15 NOTE — Telephone Encounter (Signed)
[name] [name] is returning [name] [name]'s call.

## 2020-05-15 NOTE — Telephone Encounter (Signed)
Spoke with pt, directions and time of appointment discussed with patient.

## 2020-05-15 NOTE — Telephone Encounter (Signed)
Patient c/o Palpitations:  High priority if patient c/o lightheadedness, shortness of breath, or chest pain  1) How long have you had palpitations/irregular HR/ Afib? Are you having the symptoms now? Started yesterday, yes  2) Are you currently experiencing lightheadedness, SOB or CP? no  3) Do you have a history of afib (atrial fibrillation) or irregular heart rhythm? no  4) Have you checked your BP or HR? (document readings if available): yesterday 127/85 HR 115  5) Are you experiencing any other symptoms? no   Patient states she started having fluttering in her heart yesterday. She states she did deep breathing drank a lot of water, but still has it today. She states she is not having any other symptoms. She states yesterday her BP was 127/85 and HR 115. She states she has not checked it yet today.

## 2020-05-15 NOTE — Patient Instructions (Signed)
Increase metoprolol to 37.5mg  twice a day (1 and 1/2 tablets twice a day)  Start Eliqui 2.5mg  twice a day

## 2020-05-22 ENCOUNTER — Other Ambulatory Visit: Payer: Self-pay

## 2020-05-22 ENCOUNTER — Ambulatory Visit (HOSPITAL_COMMUNITY)
Admission: RE | Admit: 2020-05-22 | Discharge: 2020-05-22 | Disposition: A | Discharge status: Home / Self Care | Payer: Medicare Other | Source: Ambulatory Visit | Attending: Physician Assistant | Admitting: Physician Assistant

## 2020-05-22 VITALS — BP 120/50 | HR 62 | Ht 61.0 in | Wt 118.0 lb

## 2020-05-22 DIAGNOSIS — I4819 Other persistent atrial fibrillation: Secondary | ICD-10-CM | POA: Insufficient documentation

## 2020-05-22 DIAGNOSIS — I483 Typical atrial flutter: Secondary | ICD-10-CM | POA: Diagnosis not present

## 2020-05-22 DIAGNOSIS — E785 Hyperlipidemia, unspecified: Secondary | ICD-10-CM | POA: Insufficient documentation

## 2020-05-22 DIAGNOSIS — K219 Gastro-esophageal reflux disease without esophagitis: Secondary | ICD-10-CM | POA: Diagnosis not present

## 2020-05-22 DIAGNOSIS — Z79899 Other long term (current) drug therapy: Secondary | ICD-10-CM | POA: Diagnosis not present

## 2020-05-22 DIAGNOSIS — Z7901 Long term (current) use of anticoagulants: Secondary | ICD-10-CM | POA: Insufficient documentation

## 2020-05-22 DIAGNOSIS — R011 Cardiac murmur, unspecified: Secondary | ICD-10-CM | POA: Diagnosis not present

## 2020-05-22 DIAGNOSIS — Z20822 Contact with and (suspected) exposure to covid-19: Secondary | ICD-10-CM | POA: Diagnosis not present

## 2020-05-22 DIAGNOSIS — D6869 Other thrombophilia: Secondary | ICD-10-CM | POA: Diagnosis not present

## 2020-05-22 DIAGNOSIS — Z01818 Encounter for other preprocedural examination: Secondary | ICD-10-CM | POA: Diagnosis not present

## 2020-05-22 DIAGNOSIS — I4892 Unspecified atrial flutter: Secondary | ICD-10-CM | POA: Diagnosis not present

## 2020-05-22 LAB — BASIC METABOLIC PANEL
Anion gap: 8 (ref 5–15)
BUN: 19 mg/dL (ref 8–23)
CO2: 25 mmol/L (ref 22–32)
Calcium: 9.1 mg/dL (ref 8.9–10.3)
Chloride: 105 mmol/L (ref 98–111)
Creatinine, Ser: 1.42 mg/dL — ABNORMAL HIGH (ref 0.44–1.00)
GFR, Estimated: 35 mL/min — ABNORMAL LOW (ref >60)
Glucose, Bld: 91 mg/dL (ref 70–99)
Potassium: 5.1 mmol/L (ref 3.5–5.1)
Sodium: 138 mmol/L (ref 135–145)

## 2020-05-22 LAB — CBC
HCT: 34.8 % — ABNORMAL LOW (ref 36.0–46.0)
Hemoglobin: 11.3 g/dL — ABNORMAL LOW (ref 12.0–15.0)
MCH: 32.2 pg (ref 26.0–34.0)
MCHC: 32.5 g/dL (ref 30.0–36.0)
MCV: 99.1 fL (ref 80.0–100.0)
Platelets: 224 10*3/uL (ref 150–400)
RBC: 3.51 MIL/uL — ABNORMAL LOW (ref 3.87–5.11)
RDW: 12.8 % (ref 11.5–15.5)
WBC: 4.3 10*3/uL (ref 4.0–10.5)
nRBC: 0 % (ref 0.0–0.2)

## 2020-05-22 NOTE — Progress Notes (Signed)
Primary Care Physician: [name], MD Primary Cardiologist: Dr [name] Primary Electrophysiologist: none Referring Physician: [facility] triage/Dr Viviana Simpler Scheidt is a 79 y.o. female with a history of thoracic aortic aneurysm and AS s/p repair 2004, paroxysmal atrial fibrillation who presents for follow up in the [facility]. The patient was initially diagnosed with atrial fibrillation in 2004 in the setting of her valve surgery. She had done well since that time with no known recurrence of afib. Patient has a CHADS2VASC score of 3. Patient was in her usual state of health until 05/14/20 when she began having heart "fluttering." These symptoms felt similar to her palpitations after her surgery. ECG 05/15/20 shows afib with RVR. There were no triggers that she could identify. She was started on Eliquis for stroke prevention.   On follow up today, patient has done reasonably well since her last visit. Her heart rate is better controlled and she has less palpitations. She denies any bleeding issues on anticoagulation.   Today, she denies symptoms of palpitations, chest pain, shortness of breath, orthopnea, PND, lower extremity edema, dizziness, presyncope, syncope, snoring, daytime somnolence, bleeding, or neurologic sequela. The patient is tolerating medications without difficulties and is otherwise without complaint today.    Atrial Fibrillation Risk Factors:  she does not have symptoms or diagnosis of sleep apnea. she does not have a history of rheumatic fever. she does not have a history of alcohol use.   she has a BMI of Body mass index is 22.3 kg/m.[name] Filed Weights   05/22/20 1332  Weight: 53.5 kg    No family history on file.   Atrial Fibrillation Management history:  Previous antiarrhythmic drugs: amiodarone Previous cardioversions: 2013 Previous ablations: none CHADS2VASC score: 3 Anticoagulation history: Eliquis   Past Medical  History:  Diagnosis Date   Aortic root dilatation ([place]) 05/11/12   CTA CHEST   Arthritis    Cardiac asystole, post DCCV requiring use of temp. external pacemaker leads 07/19/2012   Dysrhythmia    GERD (gastroesophageal reflux disease)    H/O aortic valve disorder 10/04/2002   Dr. Tharon Aquas Trigt   Headache(784.0)    Heart murmur    History of migraine headaches    Hyperlipidemia    on statin therapy   Migraines    Thoracic ascending aortic aneurysm ([place])    CTA CHEST 05/11/12   Past Surgical History:  Procedure Laterality Date   ABDOMINAL HYSTERECTOMY     AORTIC VALVE REPLACEMENT  10/04/2002    Dr. 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   LEFT AND RIGHT HEART CATHETERIZATION WITH CORONARY ANGIOGRAM N/A 06/18/2012   Procedure: LEFT AND RIGHT HEART CATHETERIZATION WITH CORONARY ANGIOGRAM;  Surgeon: Lorretta Harp, MD;  Location: [facility] CATH LAB;  Service: Cardiovascular;  Laterality: N/A;   NM MYOCAR PERF WALL MOTION  10/29/2010   normal   REPLACEMENT ASCENDING AORTA  07/07/2012   Procedure: REPLACEMENT ASCENDING AORTA;  Surgeon: Ivin Poot, MD;  Location: [place];  Service: Open Heart Surgery;  Laterality: N/A;  CIRCULATORY ARREST   TEE WITHOUT CARDIOVERSION  06/18/2012   Procedure: TRANSESOPHAGEAL ECHOCARDIOGRAM (TEE);  Surgeon: Sanda Klein, MD;  Location: [place];  Service: Cardiovascular;  Laterality: N/A;  right and left heart cath after this procedure   TEE WITHOUT CARDIOVERSION  07/15/2012   Procedure: TRANSESOPHAGEAL ECHOCARDIOGRAM (TEE);  Surgeon: Pixie Casino, MD;  [facility] ENDOSCOPY;  Service: Cardiovascular;  Laterality: N/A;   TONSILLECTOMY      Current Outpatient Medications  Medication Sig Dispense Refill    apixaban (ELIQUIS) 2.5 MG TABS tablet Take 1 tablet (2.5 mg total) by mouth 2 (two) times daily. 60 tablet 3   budesonide (RHINOCORT AQUA) 32 MCG/ACT nasal spray Place 1 spray into both nostrils as needed.      calcium gluconate 500 MG tablet Take 1 tablet by mouth daily.     Cholecalciferol (VITAMIN D) 2000 UNITS CAPS Take 2,000 Units by mouth 2 (two) times daily.     metoprolol tartrate (LOPRESSOR) 25 MG tablet Take 1.5 tablets (37.5 mg total) by mouth 2 (two) times daily. 180 tablet 3   Multiple Vitamins-Minerals (OCUVITE ADULT FORMULA) CAPS Take 1 capsule by mouth daily.     pantoprazole (PROTONIX) 40 MG tablet TAKE ONE TABLET BY MOUTH ONCE DAILY BEFORE meal of choice 90 tablet 3   pravastatin (PRAVACHOL) 20 MG tablet Take 1 tablet (20 mg total) by mouth every evening. 90 tablet 3   SUMAtriptan (IMITREX) 50 MG tablet Take 50 mg by mouth every 2 (two) hours as needed. For migraines     No current facility-administered medications for this encounter.    Allergies  Allergen Reactions   Other Nausea And Vomiting    Headache also  Also from artificial sweeetners   Codeine Nausea And Vomiting    Social History   Socioeconomic History   Marital status: Married    Spouse name: Not on file   Number of children: Not on file   Years of education: Not on file   Highest education level: Not on file  Occupational History   Not on file  Tobacco Use   Smoking status: Never Smoker   Smokeless tobacco: Never Used  Substance and Sexual Activity   Alcohol use: No   Drug use: No   Sexual activity: Not Currently  Other Topics Concern   Not on file  Social History Narrative   Not on file   Social Determinants of Health   Financial Resource Strain:    Difficulty of Paying Living Expenses: Not on file  Food Insecurity:    Worried About [place] in the Last Year: Not on file   Ran Out of Food in the Last Year: Not on file  Transportation Needs:     Lack of Transportation (Medical): Not on file   Lack of Transportation (Non-Medical): Not on file  Physical Activity:    Days of Exercise per Week: Not on file   Minutes of Exercise per Session: Not on file  Stress:    Feeling of Stress : Not on file  Social Connections:    Frequency of Communication with Friends and Family: Not on file   Frequency of Social Gatherings with Friends and Family: Not on file   Attends Religious Services: Not on file   Active Member of Clubs or Organizations: Not on file   Attends Archivist Meetings: Not on file   Marital Status: Not on file  Intimate Partner Violence:    Fear of Current or Ex-Partner: Not on file   Emotionally Abused: Not on file   Physically Abused: Not on file   Sexually Abused: Not on file     ROS- All systems  are reviewed and negative except as per the HPI above.  Physical Exam: Vitals:   05/22/20 1332  BP: (!) 120/50  Pulse: 62  Weight: 53.5 kg  Height: 5\' 1"  (1.549 m)    GEN- The patient is well appearing elderly female, alert and oriented x 3 today.   HEENT-head normocephalic, atraumatic, sclera clear, conjunctiva pink, hearing intact, trachea midline. Lungs- Clear to ausculation bilaterally, normal work of breathing Heart- irregular rate and rhythm, no murmurs, rubs or gallops  GI- soft, NT, ND, + BS Extremities- no clubbing, cyanosis, or edema MS- no significant deformity or atrophy Skin- no rash or lesion Psych- euthymic mood, full affect Neuro- strength and sensation are intact   Wt Readings from Last 3 Encounters:  05/22/20 53.5 kg  05/15/20 54.9 kg  01/25/20 55.3 kg    EKG today demonstrates typical atrial flutter with 4:1 conduction, HR 62, QRS 90, QTc 406  Echo 01/19/20 demonstrated  1. 21 mm bioprosthetic aortic valve per EMR review. Vmax 3.2 m/s, MG 24  mmHG, EOA 1.12 cm2, DI 0.26, The AT is <100 msec. There is at least  moderate regurgitation (possibly severe) that is  central and related to  leaflet deteroriation (valve leaflets are  visually thickened and calcified) of this prosthetic valve that was  implanted in 2004. The jet is anteriorly/centrally directed. No  paravalvular leak. Aortic flow poorly evaluated. The increased gradients  are related to worsening regurgitaiton of the  prosthetic valve and not related to stenosis (AT <100 msec). There was an increase in gradients between 2019 and 2020 and it appears worsening regurgitation was the issue. LV function is normal, and the cavity is not dilated (LVEDD 45 mm; LVESD 30 mm).  Would recommend a TEE for better quantification of the regurgitation,  especially if symptoms are present. The aortic valve has been  repaired/replaced. Aortic valve regurgitation is moderate to severe.  Procedure Date: 08/10/2002.  2. Left ventricular ejection fraction, by estimation, is 60 to 65%. The  left ventricle has normal function. The left ventricle has no regional  wall motion abnormalities. There is mild concentric left ventricular  hypertrophy. Left ventricular diastolic  function could not be evaluated.  3. Right ventricular systolic function is mildly reduced. The right  ventricular size is normal. There is normal pulmonary artery systolic  pressure.  4. The mitral valve is grossly normal. Mild mitral valve regurgitation.  No evidence of mitral stenosis.  5. 30-mm Dacron Hemashield graft repair of ascending aorta 07/08/2012.  Aortic root/ascending aorta has been repaired/replaced.  6. The inferior vena cava is normal in size with greater than 50%  respiratory variability, suggesting right atrial pressure of 3 mmHg.   Comparison(s): Changes from prior study are noted.   Epic records are reviewed at length today  CHA2DS2-VASc Score = 3  The patient's score is based upon: CHF History: 0 HTN History: 0 Diabetes History: 0 Stroke History: 0 Vascular Disease History: 0 Age Score: 2 Gender Score: 1       ASSESSMENT AND PLAN: 1. Persistent Atrial Fibrillation/typical atrial flutter The patient's CHA2DS2-VASc score is 3, indicating a 3.2% annual risk of stroke.   We discussed therapeutic options today including DCCV. Will arrange for DCCV once she has been on anticoagulation for 3 weeks. Recall she had a DCCV in 9937 complicated by asystole  Continue Eliquis 2.5 mg BID. (age, weight) Continue Lopressor 37.5 mg BID. Decrease back to 25 mg night prior to DCCV.  2. Secondary Hypercoagulable State (ICD10:  D68.69) The patient is at significant risk for stroke/thromboembolism based upon her CHA2DS2-VASc Score of 3.  Continue Apixaban (Eliquis).   3. AS/Aortic root dilatation Bioprosthetic valve 2004. Moderate to severe on recent echo Repeat echo scheduled.    Follow up in the AF clinic one week post DCCV.    [facility] [address] [place], [place] [address] [phone] 05/22/2020 1:51 PM

## 2020-05-22 NOTE — Patient Instructions (Signed)
Cardioversion scheduled for Monday, November 1st  - Arrive at the [facility] and go to admitting at 1030AM  - Do not eat or drink anything after midnight the night prior to your procedure.  - Take all your morning medication (except diabetic medications) with a sip of water prior to arrival.  - You will not be able to drive home after your procedure.  - Do NOT miss any doses of your blood thinner - if you should miss a dose please notify our office immediately.  - If you feel as if you go back into normal rhythm prior to scheduled cardioversion, please notify our office immediately. If your procedure is canceled in the cardioversion suite you will be charged a cancellation fee.    Night before cardioversion go back to metoprolol 25mg  twice a day

## 2020-05-22 NOTE — H&P (View-Only) (Signed)
Primary Care Physician: [name], MD Primary Cardiologist: Dr [name] Primary Electrophysiologist: none Referring Physician: [facility] triage/Dr Viviana Simpler [name] [name] is a 79 y.o. female with a history of thoracic aortic aneurysm and AS s/p repair 2004, paroxysmal atrial fibrillation who presents for follow up in the [facility]. The patient was initially diagnosed with atrial fibrillation in 2004 in the setting of her valve surgery. She had done well since that time with no known recurrence of afib. Patient has a CHADS2VASC score of 3. Patient was in her usual state of health until 05/14/20 when she began having heart "fluttering." These symptoms felt similar to her palpitations after her surgery. ECG 05/15/20 shows afib with RVR. There were no triggers that she could identify. She was started on Eliquis for stroke prevention.   On follow up today, patient has done reasonably well since her last visit. Her heart rate is better controlled and she has less palpitations. She denies any bleeding issues on anticoagulation.   Today, she denies symptoms of palpitations, chest pain, shortness of breath, orthopnea, PND, lower extremity edema, dizziness, presyncope, syncope, snoring, daytime somnolence, bleeding, or neurologic sequela. The patient is tolerating medications without difficulties and is otherwise without complaint today.    Atrial Fibrillation Risk Factors:  she does not have symptoms or diagnosis of sleep apnea. she does not have a history of rheumatic fever. she does not have a history of alcohol use.   she has a BMI of Body mass index is 22.3 kg/m.[name] Filed Weights   05/22/20 1332  Weight: 53.5 kg    No family history on file.   Atrial Fibrillation Management history:  Previous antiarrhythmic drugs: amiodarone Previous cardioversions: 2013 Previous ablations: none CHADS2VASC score: 3 Anticoagulation history: Eliquis   Past Medical  History:  Diagnosis Date  . Aortic root dilatation ([place]) 05/11/12   CTA CHEST  . Arthritis   . 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 Dysrhythmia   . GERD (gastroesophageal reflux disease)   . H/O aortic valve disorder 10/04/2002   Dr. Tharon Aquas Trigt  . Headache(784.0)   . Heart murmur   . History of migraine headaches   . Hyperlipidemia    on statin therapy  . Migraines   . Thoracic ascending aortic aneurysm [facility])    CTA CHEST 05/11/12   Past Surgical History:  Procedure Laterality Date  . ABDOMINAL HYSTERECTOMY    . AORTIC VALVE REPLACEMENT  10/04/2002    Dr. Tharon Aquas Trigt    #21 pericardial stented valve  . CARDIOVERSION  07/15/2012   Procedure: CARDIOVERSION;  Surgeon: Pixie Casino, MD;  Location: [facility] ENDOSCOPY;  Service: Cardiovascular;  Laterality: N/A;  . 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 LEFT AND RIGHT HEART CATHETERIZATION WITH CORONARY ANGIOGRAM N/A 06/18/2012   Procedure: LEFT AND RIGHT HEART CATHETERIZATION WITH CORONARY ANGIOGRAM;  Surgeon: Lorretta Harp, MD;  Location: [facility] CATH LAB;  Service: Cardiovascular;  Laterality: N/A;  . NM MYOCAR PERF WALL MOTION  10/29/2010   normal  . REPLACEMENT ASCENDING AORTA  07/07/2012   Procedure: REPLACEMENT ASCENDING AORTA;  Surgeon: Ivin Poot, MD;  Location: [place];  Service: Open Heart Surgery;  Laterality: N/A;  CIRCULATORY ARREST  . TEE WITHOUT CARDIOVERSION  06/18/2012   Procedure: TRANSESOPHAGEAL ECHOCARDIOGRAM (TEE);  Surgeon: Sanda Klein, MD;  Location: [place];  Service: Cardiovascular;  Laterality: N/A;  right and left heart cath after this procedure  . TEE WITHOUT CARDIOVERSION  07/15/2012   Procedure: TRANSESOPHAGEAL ECHOCARDIOGRAM (TEE);  Surgeon: Pixie Casino, MD;  Location: [facility] ENDOSCOPY;  Service: Cardiovascular;  Laterality: N/A;  . TONSILLECTOMY      Current Outpatient Medications  Medication Sig Dispense Refill  .  apixaban (ELIQUIS) 2.5 MG TABS tablet Take 1 tablet (2.5 mg total) by mouth 2 (two) times daily. 60 tablet 3  . budesonide (RHINOCORT AQUA) 32 MCG/ACT nasal spray Place 1 spray into both nostrils as needed.     . calcium gluconate 500 MG tablet Take 1 tablet by mouth daily.    . Cholecalciferol (VITAMIN D) 2000 UNITS CAPS Take 2,000 Units by mouth 2 (two) times daily.    . metoprolol tartrate (LOPRESSOR) 25 MG tablet Take 1.5 tablets (37.5 mg total) by mouth 2 (two) times daily. 180 tablet 3  . Multiple Vitamins-Minerals (OCUVITE ADULT FORMULA) CAPS Take 1 capsule by mouth daily.    . pantoprazole (PROTONIX) 40 MG tablet TAKE ONE TABLET BY MOUTH ONCE DAILY BEFORE meal of choice 90 tablet 3  . pravastatin (PRAVACHOL) 20 MG tablet Take 1 tablet (20 mg total) by mouth every evening. 90 tablet 3  . SUMAtriptan (IMITREX) 50 MG tablet Take 50 mg by mouth every 2 (two) hours as needed. For migraines     No current facility-administered medications for this encounter.    Allergies  Allergen Reactions  . Other Nausea And Vomiting    Headache also  Also from artificial sweeetners  . Codeine Nausea And Vomiting    Social History   Socioeconomic History  . Marital status: Married    Spouse name: Not on file  . Number of children: Not on file  . Years of education: Not on file  . Highest education level: Not on file  Occupational History  . Not on file  Tobacco Use  . Smoking status: Never Smoker  . Smokeless tobacco: Never Used  Substance and Sexual Activity  . Alcohol use: No  . Drug use: No  . Sexual activity: Not Currently  Other Topics Concern  . Not on file  Social History Narrative  . Not on file   Social Determinants of Health   Financial Resource Strain:   . Difficulty of Paying Living Expenses: Not on file  Food Insecurity:   . Worried About Charity fundraiser in the Last Year: Not on file  . Ran Out of Food in the Last Year: Not on file  Transportation Needs:   .  Lack of Transportation (Medical): Not on file  . Lack of Transportation (Non-Medical): Not on file  Physical Activity:   . Days of Exercise per Week: Not on file  . Minutes of Exercise per Session: Not on file  Stress:   . Feeling of Stress : Not on file  Social Connections:   . Frequency of Communication with Friends and Family: Not on file  . Frequency of Social Gatherings with Friends and Family: Not on file  . Attends Religious Services: Not on file  . Active Member of Clubs or Organizations: Not on file  . Attends Archivist Meetings: Not on file  . Marital Status: Not on file  Intimate Partner Violence:   . Fear of Current or Ex-Partner: Not on file  . Emotionally Abused: Not on file  . Physically Abused: Not on file  . Sexually Abused: Not on file     ROS- All systems  are reviewed and negative except as per the HPI above.  Physical Exam: Vitals:   05/22/20 1332  BP: (!) 120/50  Pulse: 62  Weight: 53.5 kg  Height: 5\' 1"  (1.549 m)    GEN- The patient is well appearing elderly female, alert and oriented x 3 today.   HEENT-head normocephalic, atraumatic, sclera clear, conjunctiva pink, hearing intact, trachea midline. Lungs- Clear to ausculation bilaterally, normal work of breathing Heart- irregular rate and rhythm, no murmurs, rubs or gallops  GI- soft, NT, ND, + BS Extremities- no clubbing, cyanosis, or edema MS- no significant deformity or atrophy Skin- no rash or lesion Psych- euthymic mood, full affect Neuro- strength and sensation are intact   Wt Readings from Last 3 Encounters:  05/22/20 53.5 kg  05/15/20 54.9 kg  01/25/20 55.3 kg    EKG today demonstrates typical atrial flutter with 4:1 conduction, HR 62, QRS 90, QTc 406  Echo 01/19/20 demonstrated  1. 21 mm bioprosthetic aortic valve per EMR review. Vmax 3.2 m/s, MG 24  mmHG, EOA 1.12 cm2, DI 0.26, The AT is <100 msec. There is at least  moderate regurgitation (possibly severe) that is  central and related to  leaflet deteroriation (valve leaflets are  visually thickened and calcified) of this prosthetic valve that was  implanted in 2004. The jet is anteriorly/centrally directed. No  paravalvular leak. Aortic flow poorly evaluated. The increased gradients  are related to worsening regurgitaiton of the  prosthetic valve and not related to stenosis (AT <100 msec). There was an increase in gradients between 2019 and 2020 and it appears worsening regurgitation was the issue. LV function is normal, and the cavity is not dilated (LVEDD 45 mm; LVESD 30 mm).  Would recommend a TEE for better quantification of the regurgitation,  especially if symptoms are present. The aortic valve has been  repaired/replaced. Aortic valve regurgitation is moderate to severe.  Procedure Date: 08/10/2002.  2. Left ventricular ejection fraction, by estimation, is 60 to 65%. The  left ventricle has normal function. The left ventricle has no regional  wall motion abnormalities. There is mild concentric left ventricular  hypertrophy. Left ventricular diastolic  function could not be evaluated.  3. Right ventricular systolic function is mildly reduced. The right  ventricular size is normal. There is normal pulmonary artery systolic  pressure.  4. The mitral valve is grossly normal. Mild mitral valve regurgitation.  No evidence of mitral stenosis.  5. 30-mm Dacron Hemashield graft repair of ascending aorta 07/08/2012.  Aortic root/ascending aorta has been repaired/replaced.  6. The inferior vena cava is normal in size with greater than 50%  respiratory variability, suggesting right atrial pressure of 3 mmHg.   Comparison(s): Changes from prior study are noted.   Epic records are reviewed at length today  CHA2DS2-VASc Score = 3  The patient's score is based upon: CHF History: 0 HTN History: 0 Diabetes History: 0 Stroke History: 0 Vascular Disease History: 0 Age Score: 2 Gender Score: 1       ASSESSMENT AND PLAN: 1. Persistent Atrial Fibrillation/typical atrial flutter The patient's CHA2DS2-VASc score is 3, indicating a 3.2% annual risk of stroke.   We discussed therapeutic options today including DCCV. Will arrange for DCCV once she has been on anticoagulation for 3 weeks. Recall she had a DCCV in 6720 complicated by asystole  Continue Eliquis 2.5 mg BID. (age, weight) Continue Lopressor 37.5 mg BID. Decrease back to 25 mg night prior to DCCV.  2. Secondary Hypercoagulable State (ICD10:  D68.69) The patient is at significant risk for stroke/thromboembolism based upon her CHA2DS2-VASc Score of 3.  Continue Apixaban (Eliquis).   3. AS/Aortic root dilatation Bioprosthetic valve 2004. Moderate to severe on recent echo Repeat echo scheduled.    Follow up in the AF clinic one week post DCCV.    [facility] [address] [place], [place] [address] [phone] 05/22/2020 1:51 PM

## 2020-06-03 ENCOUNTER — Other Ambulatory Visit (HOSPITAL_COMMUNITY)
Admission: RE | Admit: 2020-06-03 | Discharge: 2020-06-03 | Disposition: A | Discharge status: Home / Self Care | Payer: Medicare Other | Source: Ambulatory Visit | Attending: Cardiology | Admitting: Cardiology

## 2020-06-03 DIAGNOSIS — Z01812 Encounter for preprocedural laboratory examination: Secondary | ICD-10-CM | POA: Diagnosis not present

## 2020-06-03 DIAGNOSIS — Z20822 Contact with and (suspected) exposure to covid-19: Secondary | ICD-10-CM | POA: Insufficient documentation

## 2020-06-04 LAB — SARS CORONAVIRUS 2 (TAT 6-24 HRS): SARS Coronavirus 2: NEGATIVE

## 2020-06-05 ENCOUNTER — Ambulatory Visit (HOSPITAL_COMMUNITY): Payer: Medicare Other | Admitting: Anesthesiology

## 2020-06-05 ENCOUNTER — Ambulatory Visit (HOSPITAL_COMMUNITY)
Admission: RE | Admit: 2020-06-05 | Discharge: 2020-06-05 | Disposition: A | Discharge status: Home / Self Care | Payer: Medicare Other | Attending: Cardiology | Admitting: Cardiology

## 2020-06-05 ENCOUNTER — Other Ambulatory Visit: Payer: Self-pay

## 2020-06-05 ENCOUNTER — Encounter (HOSPITAL_COMMUNITY): Payer: Self-pay | Admitting: Cardiology

## 2020-06-05 ENCOUNTER — Encounter (HOSPITAL_COMMUNITY)
Admission: RE | Disposition: A | Discharge status: Home / Self Care | Payer: Self-pay | Source: Home / Self Care | Attending: Cardiology

## 2020-06-05 DIAGNOSIS — I700 Atherosclerosis of aorta: Secondary | ICD-10-CM | POA: Diagnosis not present

## 2020-06-05 DIAGNOSIS — Z885 Allergy status to narcotic agent status: Secondary | ICD-10-CM | POA: Diagnosis not present

## 2020-06-05 DIAGNOSIS — D6869 Other thrombophilia: Secondary | ICD-10-CM | POA: Diagnosis not present

## 2020-06-05 DIAGNOSIS — I110 Hypertensive heart disease with heart failure: Secondary | ICD-10-CM | POA: Diagnosis not present

## 2020-06-05 DIAGNOSIS — Z7901 Long term (current) use of anticoagulants: Secondary | ICD-10-CM | POA: Diagnosis not present

## 2020-06-05 DIAGNOSIS — R0600 Dyspnea, unspecified: Secondary | ICD-10-CM | POA: Diagnosis not present

## 2020-06-05 DIAGNOSIS — I4891 Unspecified atrial fibrillation: Secondary | ICD-10-CM | POA: Diagnosis not present

## 2020-06-05 DIAGNOSIS — I77819 Aortic ectasia, unspecified site: Secondary | ICD-10-CM | POA: Diagnosis not present

## 2020-06-05 DIAGNOSIS — I10 Essential (primary) hypertension: Secondary | ICD-10-CM | POA: Diagnosis not present

## 2020-06-05 DIAGNOSIS — R069 Unspecified abnormalities of breathing: Secondary | ICD-10-CM | POA: Diagnosis not present

## 2020-06-05 DIAGNOSIS — I509 Heart failure, unspecified: Secondary | ICD-10-CM | POA: Diagnosis not present

## 2020-06-05 DIAGNOSIS — I4892 Unspecified atrial flutter: Secondary | ICD-10-CM | POA: Diagnosis not present

## 2020-06-05 DIAGNOSIS — D649 Anemia, unspecified: Secondary | ICD-10-CM | POA: Diagnosis not present

## 2020-06-05 DIAGNOSIS — I480 Paroxysmal atrial fibrillation: Secondary | ICD-10-CM | POA: Diagnosis not present

## 2020-06-05 DIAGNOSIS — Z90710 Acquired absence of both cervix and uterus: Secondary | ICD-10-CM | POA: Diagnosis not present

## 2020-06-05 DIAGNOSIS — R0902 Hypoxemia: Secondary | ICD-10-CM | POA: Diagnosis not present

## 2020-06-05 DIAGNOSIS — I4819 Other persistent atrial fibrillation: Secondary | ICD-10-CM | POA: Diagnosis not present

## 2020-06-05 DIAGNOSIS — I483 Typical atrial flutter: Secondary | ICD-10-CM | POA: Diagnosis not present

## 2020-06-05 DIAGNOSIS — E785 Hyperlipidemia, unspecified: Secondary | ICD-10-CM | POA: Diagnosis not present

## 2020-06-05 DIAGNOSIS — K5730 Diverticulosis of large intestine without perforation or abscess without bleeding: Secondary | ICD-10-CM | POA: Diagnosis not present

## 2020-06-05 DIAGNOSIS — R0602 Shortness of breath: Secondary | ICD-10-CM | POA: Diagnosis not present

## 2020-06-05 DIAGNOSIS — J9811 Atelectasis: Secondary | ICD-10-CM | POA: Diagnosis not present

## 2020-06-05 DIAGNOSIS — J90 Pleural effusion, not elsewhere classified: Secondary | ICD-10-CM | POA: Diagnosis not present

## 2020-06-05 DIAGNOSIS — J811 Chronic pulmonary edema: Secondary | ICD-10-CM | POA: Diagnosis not present

## 2020-06-05 HISTORY — PX: CARDIOVERSION: SHX1299

## 2020-06-05 SURGERY — CARDIOVERSION
Anesthesia: General

## 2020-06-05 MED ORDER — EPHEDRINE SULFATE-NACL 50-0.9 MG/10ML-% IV SOSY
PREFILLED_SYRINGE | INTRAVENOUS | Status: DC | PRN
  Administered 2020-06-05: 10 mg via INTRAVENOUS

## 2020-06-05 MED ORDER — PROPOFOL 10 MG/ML IV BOLUS
INTRAVENOUS | Status: DC | PRN
  Administered 2020-06-05: 50 mg via INTRAVENOUS

## 2020-06-05 MED ORDER — SODIUM CHLORIDE 0.9 % IV SOLN: INTRAVENOUS | Status: DC

## 2020-06-05 MED ORDER — LIDOCAINE 2% (20 MG/ML) 5 ML SYRINGE
INTRAMUSCULAR | Status: DC | PRN
  Administered 2020-06-05: 60 mg via INTRAVENOUS

## 2020-06-05 MED ORDER — METOPROLOL TARTRATE 25 MG PO TABS
25.0000 mg | ORAL_TABLET | Freq: Two times a day (BID) | ORAL | 3 refills | Status: DC
Start: 2020-06-05 — End: 2021-06-27

## 2020-06-05 NOTE — CV Procedure (Signed)
   Electrical Cardioversion Procedure Note [name] [name] [id number]  Procedure: Electrical Cardioversion Indications:  Atrial Flutter with variable block  Time Out: Verified patient identification, verified procedure,medications/allergies/relevent history reviewed, required imaging and test results available.  Performed  Procedure Details  The patient was NPO after midnight. Anesthesia was administered at the beside  by Dr.Bass with 50mg  of propofol and 60mg  Lidocaine.  Cardioversion was done with synchronized biphasic defibrillation with AP pads with 150watts.  The patient converted to normal sinus rhythm. The patient tolerated the procedure well   IMPRESSION:  Successful cardioversion of atrial flutter    [name] [name] 06/05/2020, 10:35 AM

## 2020-06-05 NOTE — Transfer of Care (Signed)
Immediate Anesthesia Transfer of Care Note  Patient: [name] [name]  Procedure(s) Performed: CARDIOVERSION (N/A )  Patient Location: Endoscopy Unit  Anesthesia Type:General  Level of Consciousness: awake, alert  and oriented  Airway & Oxygen Therapy: Patient Spontanous Breathing and Patient connected to nasal cannula oxygen  Post-op Assessment: Report given to RN and Post -op Vital signs reviewed and stable  Post vital signs: Reviewed and stable  Last Vitals:  Vitals Value Taken Time  BP    Temp    Pulse    Resp    SpO2      Last Pain:  Vitals:   06/05/20 1056  TempSrc: Oral  PainSc: 0-No pain         Complications: No complications documented.

## 2020-06-05 NOTE — Discharge Instructions (Signed)
Electrical Cardioversion Electrical cardioversion is the delivery of a jolt of electricity to restore a normal rhythm to the heart. A rhythm that is too fast or is not regular keeps the heart from pumping well. In this procedure, sticky patches or metal paddles are placed on the chest to deliver electricity to the heart from a device. This procedure may be done in an emergency if:  There is low or no blood pressure as a result of the heart rhythm.  Normal rhythm must be restored as fast as possible to protect the brain and heart from further damage.  It may save a life. This may also be a scheduled procedure for irregular or fast heart rhythms that are not immediately life-threatening. Tell a health care provider about:  Any allergies you have.  All medicines you are taking, including vitamins, herbs, eye drops, creams, and over-the-counter medicines.  Any problems you or family members have had with anesthetic medicines.  Any blood disorders you have.  Any surgeries you have had.  Any medical conditions you have.  Whether you are pregnant or may be pregnant. What are the risks? Generally, this is a safe procedure. However, problems may occur, including:  Allergic reactions to medicines.  A blood clot that breaks free and travels to other parts of your body.  The possible return of an abnormal heart rhythm within hours or days after the procedure.  Your heart stopping (cardiac arrest). This is rare. What happens before the procedure? Medicines  Your health care provider may have you start taking: ? Blood-thinning medicines (anticoagulants) so your blood does not clot as easily. ? Medicines to help stabilize your heart rate and rhythm.  Ask your health care provider about: ? Changing or stopping your regular medicines. This is especially important if you are taking diabetes medicines or blood thinners. ? Taking medicines such as aspirin and ibuprofen. These medicines can  thin your blood. Do not take these medicines unless your health care provider tells you to take them. ? Taking over-the-counter medicines, vitamins, herbs, and supplements. General instructions  Follow instructions from your health care provider about eating or drinking restrictions.  Plan to have someone take you home from the hospital or clinic.  If you will be going home right after the procedure, plan to have someone with you for 24 hours.  Ask your health care provider what steps will be taken to help prevent infection. These may include washing your skin with a germ-killing soap. What happens during the procedure?   An IV will be inserted into one of your veins.  Sticky patches (electrodes) or metal paddles may be placed on your chest.  You will be given a medicine to help you relax (sedative).  An electrical shock will be delivered. The procedure may vary among health care providers and hospitals. What can I expect after the procedure?  Your blood pressure, heart rate, breathing rate, and blood oxygen level will be monitored until you leave the hospital or clinic.  Your heart rhythm will be watched to make sure it does not change.  You may have some redness on the skin where the shocks were given. Follow these instructions at home:  Do not drive for 24 hours if you were given a sedative during your procedure.  Take over-the-counter and prescription medicines only as told by your health care provider.  Ask your health care provider how to check your pulse. Check it often.  Rest for 48 hours after the procedure or   as told by your health care provider.  Avoid or limit your caffeine use as told by your health care provider.  Keep all follow-up visits as told by your health care provider. This is important. Contact a health care provider if:  You feel like your heart is beating too quickly or your pulse is not regular.  You have a serious muscle cramp that does not go  away. Get help right away if:  You have discomfort in your chest.  You are dizzy or you feel faint.  You have trouble breathing or you are short of breath.  Your speech is slurred.  You have trouble moving an arm or leg on one side of your body.  Your fingers or toes turn cold or blue. Summary  Electrical cardioversion is the delivery of a jolt of electricity to restore a normal rhythm to the heart.  This procedure may be done right away in an emergency or may be a scheduled procedure if the condition is not an emergency.  Generally, this is a safe procedure.  After the procedure, check your pulse often as told by your health care provider. This information is not intended to replace advice given to you by your health care provider. Make sure you discuss any questions you have with your health care provider. Document Revised: 02/22/2019 Document Reviewed: 02/22/2019 Elsevier Patient Education  2020 [employer]vier Inc[employer]  

## 2020-06-05 NOTE — Anesthesia Postprocedure Evaluation (Signed)
Anesthesia Post Note  Patient: [name] [name]  Procedure(s) Performed: CARDIOVERSION (N/A )     Patient location during evaluation: PACU Anesthesia Type: General Level of consciousness: awake Pain management: pain level controlled Vital Signs Assessment: post-procedure vital signs reviewed and stable Respiratory status: spontaneous breathing and respiratory function stable Cardiovascular status: stable Postop Assessment: no apparent nausea or vomiting Anesthetic complications: no   No complications documented.  Last Vitals:  Vitals:   06/05/20 1130 06/05/20 1140  BP: (!) 99/39 (!) 97/40  Pulse: 62 (!) 58  Resp: (!) 22 (!) 25  Temp: 36.4 C   SpO2: 97% 95%    Last Pain:  Vitals:   06/05/20 1140  TempSrc:   PainSc: 0-No pain                 Merlinda Frederick

## 2020-06-05 NOTE — Anesthesia Preprocedure Evaluation (Signed)
Anesthesia Evaluation  Patient identified by MRN, date of birth, ID band Patient awake    Reviewed: Allergy & Precautions, H&P , NPO status , Patient's Chart, lab work & pertinent test results, reviewed documented beta blocker date and time   Airway Mallampati: II  TM Distance: >3 FB Neck ROM: Full    Dental no notable dental hx. (+) Dental Advisory Given   Pulmonary    Pulmonary exam normal breath sounds clear to auscultation       Cardiovascular + Peripheral Vascular Disease and + DOE  Normal cardiovascular exam+ dysrhythmias Atrial Fibrillation  Rhythm:Irregular Rate:Abnormal  S/p replacement aortic aneurysm   Neuro/Psych  Headaches,    GI/Hepatic GERD  ,  Endo/Other    Renal/GU Renal InsufficiencyRenal disease     Musculoskeletal  (+) Arthritis ,   Abdominal   Peds  Hematology  (+) anemia ,   Anesthesia Other Findings   Reproductive/Obstetrics                             Anesthesia Physical  Anesthesia Plan  ASA: III  Anesthesia Plan: General   Post-op Pain Management:    Induction: Intravenous  PONV Risk Score and Plan:   Airway Management Planned: Mask  Additional Equipment:   Intra-op Plan:   Post-operative Plan:   Informed Consent: I have reviewed the patients History and Physical, chart, labs and discussed the procedure including the risks, benefits and alternatives for the proposed anesthesia with the patient or authorized representative who has indicated his/her understanding and acceptance.     Dental advisory given  Plan Discussed with: Anesthesiologist and Surgeon  Anesthesia Plan Comments:         Anesthesia Quick Evaluation

## 2020-06-05 NOTE — Interval H&P Note (Signed)
History and Physical Interval Note:  06/05/2020 88:35 AM  [name] [name]  has presented today for surgery, with the diagnosis of AFIB.  The various methods of treatment have been discussed with the patient and family. After consideration of risks, benefits and other options for treatment, the patient has consented to  Procedure(s): CARDIOVERSION (N/A) as a surgical intervention.  The patient's history has been reviewed, patient examined, no change in status, stable for surgery.  I have reviewed the patient's chart and labs.  Questions were answered to the patient's satisfaction.     Fransico Him

## 2020-06-06 ENCOUNTER — Encounter: Payer: Self-pay | Admitting: Cardiology

## 2020-06-06 DIAGNOSIS — Z7901 Long term (current) use of anticoagulants: Secondary | ICD-10-CM | POA: Diagnosis not present

## 2020-06-06 DIAGNOSIS — J811 Chronic pulmonary edema: Secondary | ICD-10-CM | POA: Diagnosis not present

## 2020-06-06 DIAGNOSIS — R0600 Dyspnea, unspecified: Secondary | ICD-10-CM | POA: Diagnosis not present

## 2020-06-06 DIAGNOSIS — E785 Hyperlipidemia, unspecified: Secondary | ICD-10-CM | POA: Diagnosis not present

## 2020-06-06 DIAGNOSIS — K5730 Diverticulosis of large intestine without perforation or abscess without bleeding: Secondary | ICD-10-CM | POA: Diagnosis not present

## 2020-06-06 DIAGNOSIS — Z79899 Other long term (current) drug therapy: Secondary | ICD-10-CM | POA: Diagnosis not present

## 2020-06-06 DIAGNOSIS — Z953 Presence of xenogenic heart valve: Secondary | ICD-10-CM | POA: Diagnosis not present

## 2020-06-06 DIAGNOSIS — I340 Nonrheumatic mitral (valve) insufficiency: Secondary | ICD-10-CM | POA: Diagnosis not present

## 2020-06-06 DIAGNOSIS — N179 Acute kidney failure, unspecified: Secondary | ICD-10-CM | POA: Diagnosis present

## 2020-06-06 DIAGNOSIS — I4892 Unspecified atrial flutter: Secondary | ICD-10-CM | POA: Diagnosis present

## 2020-06-06 DIAGNOSIS — I110 Hypertensive heart disease with heart failure: Secondary | ICD-10-CM | POA: Diagnosis present

## 2020-06-06 DIAGNOSIS — I509 Heart failure, unspecified: Secondary | ICD-10-CM | POA: Diagnosis not present

## 2020-06-06 DIAGNOSIS — J9811 Atelectasis: Secondary | ICD-10-CM | POA: Diagnosis not present

## 2020-06-06 DIAGNOSIS — I700 Atherosclerosis of aorta: Secondary | ICD-10-CM | POA: Diagnosis not present

## 2020-06-06 DIAGNOSIS — I5031 Acute diastolic (congestive) heart failure: Secondary | ICD-10-CM | POA: Diagnosis present

## 2020-06-06 DIAGNOSIS — I4891 Unspecified atrial fibrillation: Secondary | ICD-10-CM | POA: Diagnosis not present

## 2020-06-06 DIAGNOSIS — I361 Nonrheumatic tricuspid (valve) insufficiency: Secondary | ICD-10-CM | POA: Diagnosis not present

## 2020-06-06 DIAGNOSIS — Z90710 Acquired absence of both cervix and uterus: Secondary | ICD-10-CM | POA: Diagnosis not present

## 2020-06-06 DIAGNOSIS — J90 Pleural effusion, not elsewhere classified: Secondary | ICD-10-CM | POA: Diagnosis not present

## 2020-06-07 ENCOUNTER — Encounter (HOSPITAL_COMMUNITY): Payer: Self-pay | Admitting: Cardiology

## 2020-06-07 DIAGNOSIS — Z7901 Long term (current) use of anticoagulants: Secondary | ICD-10-CM | POA: Diagnosis not present

## 2020-06-07 DIAGNOSIS — Z953 Presence of xenogenic heart valve: Secondary | ICD-10-CM | POA: Diagnosis not present

## 2020-06-07 DIAGNOSIS — I5031 Acute diastolic (congestive) heart failure: Secondary | ICD-10-CM | POA: Diagnosis not present

## 2020-06-07 DIAGNOSIS — I4892 Unspecified atrial flutter: Secondary | ICD-10-CM | POA: Diagnosis not present

## 2020-06-12 ENCOUNTER — Ambulatory Visit (HOSPITAL_COMMUNITY)
Admission: RE | Admit: 2020-06-12 | Discharge: 2020-06-12 | Disposition: A | Discharge status: Home / Self Care | Payer: Medicare Other | Source: Ambulatory Visit | Attending: Physician Assistant | Admitting: Physician Assistant

## 2020-06-12 ENCOUNTER — Other Ambulatory Visit: Payer: Self-pay

## 2020-06-12 ENCOUNTER — Ambulatory Visit (HOSPITAL_COMMUNITY): Payer: Medicare Other | Admitting: Physician Assistant

## 2020-06-12 VITALS — BP 138/84 | HR 58 | Ht 61.0 in | Wt 120.4 lb

## 2020-06-12 DIAGNOSIS — I4819 Other persistent atrial fibrillation: Secondary | ICD-10-CM

## 2020-06-12 DIAGNOSIS — I77819 Aortic ectasia, unspecified site: Secondary | ICD-10-CM | POA: Insufficient documentation

## 2020-06-12 DIAGNOSIS — I483 Typical atrial flutter: Secondary | ICD-10-CM | POA: Insufficient documentation

## 2020-06-12 DIAGNOSIS — Z79899 Other long term (current) drug therapy: Secondary | ICD-10-CM | POA: Diagnosis not present

## 2020-06-12 DIAGNOSIS — Z952 Presence of prosthetic heart valve: Secondary | ICD-10-CM | POA: Insufficient documentation

## 2020-06-12 DIAGNOSIS — Z885 Allergy status to narcotic agent status: Secondary | ICD-10-CM | POA: Diagnosis not present

## 2020-06-12 DIAGNOSIS — D6869 Other thrombophilia: Secondary | ICD-10-CM | POA: Diagnosis not present

## 2020-06-12 DIAGNOSIS — Z7901 Long term (current) use of anticoagulants: Secondary | ICD-10-CM | POA: Insufficient documentation

## 2020-06-12 NOTE — Progress Notes (Signed)
Primary Care Physician: [name], MD Primary Cardiologist: Dr [name] Primary Electrophysiologist: none Referring Physician: [facility] triage/Dr Viviana Simpler Overacker is a 79 y.o. 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 The patient was initially diagnosed with atrial fibrillation in 2004 in the setting of her valve surgery. She had done well since that time with no known recurrence of afib. Patient has a CHADS2VASC score of 3. Patient was in her usual state of health until 05/14/20 when she began having heart "fluttering." These symptoms felt similar to her palpitations after her surgery. ECG 05/15/20 shows afib with RVR. There were no triggers that she could identify. She was started on Eliquis for stroke prevention.   On follow up today, patient is s/p DCCV on 06/05/20. She was seen at a hospital in [place] the night following her DCCV with SOB. She was told that her lungs were "weeping". She was discharged with Lasix and is feeling much better. She denies any further palpitations. She denies any bleeding issues with anticoagulation.   Today, she denies symptoms of palpitations, chest pain, shortness of breath, orthopnea, PND, lower extremity edema, dizziness, presyncope, syncope, snoring, daytime somnolence, bleeding, or neurologic sequela. The patient is tolerating medications without difficulties and is otherwise without complaint today.    Atrial Fibrillation Risk Factors:  she does not have symptoms or diagnosis of sleep apnea. she does not have a history of rheumatic fever. she does not have a history of alcohol use.   she has a BMI of Body mass index is 22.75 kg/m.[name] Filed Weights   06/12/20 0919  Weight: 54.6 kg    No family history on file.   Atrial Fibrillation Management history:  Previous antiarrhythmic  drugs: amiodarone Previous cardioversions: 2013, 06/05/20 Previous ablations: none CHADS2VASC score: 3 Anticoagulation history: Eliquis   Past Medical History:  Diagnosis Date  . Aortic root dilatation ([place]) 05/11/12   CTA CHEST  . Arthritis   . 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 Dysrhythmia   . GERD (gastroesophageal reflux disease)   . H/O aortic valve disorder 10/04/2002   Dr. Tharon Aquas Trigt  . Headache(784.0)   . Heart murmur   . History of migraine headaches   . Hyperlipidemia    on statin therapy  . Migraines   . Thoracic ascending aortic aneurysm [facility])    CTA CHEST 05/11/12   Past Surgical History:  Procedure Laterality Date  . ABDOMINAL HYSTERECTOMY    . AORTIC VALVE REPLACEMENT  10/04/2002    Dr. Tharon Aquas Trigt    #21 pericardial stented valve  . CARDIOVERSION  07/15/2012   Procedure: CARDIOVERSION;  Surgeon: Pixie Casino, MD;  Location: [facility] ENDOSCOPY;  Service: Cardiovascular;  Laterality: N/A;  . CARDIOVERSION N/A 06/05/2020   Procedure: CARDIOVERSION;  Surgeon: Sueanne Margarita, MD;  Location: [facility] ENDOSCOPY;  Service: Cardiovascular;  Laterality: N/A;  . 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 LEFT AND RIGHT HEART CATHETERIZATION WITH CORONARY ANGIOGRAM N/A 06/18/2012   Procedure: LEFT AND RIGHT HEART CATHETERIZATION WITH CORONARY ANGIOGRAM;  Surgeon: Lorretta Harp, MD;  Location: [facility] CATH LAB;  Service: Cardiovascular;  Laterality: N/A;  . NM MYOCAR PERF WALL MOTION  10/29/2010   normal  . REPLACEMENT ASCENDING AORTA  07/07/2012   Procedure: REPLACEMENT  ASCENDING AORTA;  Surgeon: Ivin Poot, MD;  Location: [place];  Service: Open Heart Surgery;  Laterality: N/A;  CIRCULATORY ARREST  . TEE WITHOUT CARDIOVERSION  06/18/2012   Procedure: TRANSESOPHAGEAL ECHOCARDIOGRAM (TEE);  Surgeon: Sanda Klein, MD;  Location: [facility] ENDOSCOPY;  Service: Cardiovascular;  Laterality: N/A;  right and left heart cath  after this procedure  . TEE WITHOUT CARDIOVERSION  07/15/2012   Procedure: TRANSESOPHAGEAL ECHOCARDIOGRAM (TEE);  Surgeon: Pixie Casino, MD;  Location: [facility] ENDOSCOPY;  Service: Cardiovascular;  Laterality: N/A;  . TONSILLECTOMY      Current Outpatient Medications  Medication Sig Dispense Refill  . apixaban (ELIQUIS) 2.5 MG TABS tablet Take 1 tablet (2.5 mg total) by mouth 2 (two) times daily. 60 tablet 3  . B Complex-C (B-COMPLEX WITH VITAMIN C) tablet Take 1 tablet by mouth daily.    . budesonide (RHINOCORT AQUA) 32 MCG/ACT nasal spray Place 1 spray into both nostrils as needed for allergies.     . Calcium Carbonate-Vit D-Min (CALTRATE MINIS PLUS MINERALS PO) Take 1 tablet by mouth every evening.    . Chlorpheniramine-PSE-Ibuprofen (ADVIL ALLERGY SINUS) 2-30-200 MG TABS Take 1-2 tablets by mouth as needed (allergies/sinus issues.).     [name] cholecalciferol (VITAMIN D) 25 MCG (1000 UNIT) tablet Take 1,000 Units by mouth every evening.    . metoprolol tartrate (LOPRESSOR) 25 MG tablet Take 1 tablet (25 mg total) by mouth 2 (two) times daily. 180 tablet 3  . Multiple Vitamin (MULTIVITAMIN WITH MINERALS) TABS tablet Take 1 tablet by mouth daily. Centrum for Women    . Multiple Vitamins-Minerals (OCUVITE ADULT FORMULA) CAPS Take 1 capsule by mouth every evening.     . pantoprazole (PROTONIX) 40 MG tablet TAKE ONE TABLET BY MOUTH ONCE DAILY BEFORE meal of choice (Patient taking differently: Take 40 mg by mouth daily. ) 90 tablet 3  . pravastatin (PRAVACHOL) 10 MG tablet Take 10 mg by mouth every evening.    . SUMAtriptan (IMITREX) 100 MG tablet Take 100 mg by mouth every 2 (two) hours as needed for migraine. May repeat in 2 hours if headache persists or recurs.    . furosemide (LASIX) 20 MG tablet Take 20 mg by mouth every morning.     No current facility-administered medications for this encounter.    Allergies  Allergen Reactions  . Other Nausea And Vomiting    Headache also  Also from  artificial sweeetners  . Codeine Nausea And Vomiting    Social History   Socioeconomic History  . Marital status: Married    Spouse name: Not on file  . Number of children: Not on file  . Years of education: Not on file  . Highest education level: Not on file  Occupational History  . Not on file  Tobacco Use  . Smoking status: Never Smoker  . Smokeless tobacco: Never Used  Substance and Sexual Activity  . Alcohol use: No  . Drug use: No  . Sexual activity: Not Currently  Other Topics Concern  . Not on file  Social History Narrative  . Not on file   Social Determinants of Health   Financial Resource Strain:   . Difficulty of Paying Living Expenses: Not on file  Food Insecurity:   . Worried About Charity fundraiser in the Last Year: Not on file  . Ran Out of Food in the Last Year: Not on file  Transportation Needs:   . Lack of Transportation (Medical): Not on file  . Lack  of Transportation (Non-Medical): Not on file  Physical Activity:   . Days of Exercise per Week: Not on file  . Minutes of Exercise per Session: Not on file  Stress:   . Feeling of Stress : Not on file  Social Connections:   . Frequency of Communication with Friends and Family: Not on file  . Frequency of Social Gatherings with Friends and Family: Not on file  . Attends Religious Services: Not on file  . Active Member of Clubs or Organizations: Not on file  . Attends Archivist Meetings: Not on file  . Marital Status: Not on file  Intimate Partner Violence:   . Fear of Current or Ex-Partner: Not on file  . Emotionally Abused: Not on file  . Physically Abused: Not on file  . Sexually Abused: Not on file     ROS- All systems are reviewed and negative except as per the HPI above.  Physical Exam: Vitals:   06/12/20 0919  BP: 138/84  Pulse: (!) 58  Weight: 54.6 kg  Height: 5\' 1"  (1.549 m)    GEN- The patient is well appearing elderly female, alert and oriented x 3 today.    HEENT-head normocephalic, atraumatic, sclera clear, conjunctiva pink, hearing intact, trachea midline. Lungs- Clear to ausculation bilaterally, normal work of breathing Heart- Regular rate and rhythm, no murmurs, rubs or gallops  GI- soft, NT, ND, + BS Extremities- no clubbing, cyanosis, or edema MS- no significant deformity or atrophy Skin- no rash or lesion Psych- euthymic mood, full affect Neuro- strength and sensation are intact   Wt Readings from Last 3 Encounters:  06/12/20 54.6 kg  06/05/20 53.5 kg  05/22/20 53.5 kg    EKG today demonstrates SB HR 58, 1st degree AV block, PR 220, QRS 94, QTc 428  Echo 01/19/20 demonstrated  1. 21 mm bioprosthetic aortic valve per EMR review. Vmax 3.2 m/s, MG 24  mmHG, EOA 1.12 cm2, DI 0.26, The AT is <100 msec. There is at least  moderate regurgitation (possibly severe) that is central and related to  leaflet deteroriation (valve leaflets are  visually thickened and calcified) of this prosthetic valve that was  implanted in 2004. The jet is anteriorly/centrally directed. No  paravalvular leak. Aortic flow poorly evaluated. The increased gradients  are related to worsening regurgitaiton of the  prosthetic valve and not related to stenosis (AT <100 msec). There was an increase in gradients between 2019 and 2020 and it appears worsening regurgitation was the issue. LV function is normal, and the cavity is not dilated (LVEDD 45 mm; LVESD 30 mm).  Would recommend a TEE for better quantification of the regurgitation,  especially if symptoms are present. The aortic valve has been  repaired/replaced. Aortic valve regurgitation is moderate to severe.  Procedure Date: 08/10/2002.  2. Left ventricular ejection fraction, by estimation, is 60 to 65%. The  left ventricle has normal function. The left ventricle has no regional  wall motion abnormalities. There is mild concentric left ventricular  hypertrophy. Left ventricular diastolic  function could  not be evaluated.  3. Right ventricular systolic function is mildly reduced. The right  ventricular size is normal. There is normal pulmonary artery systolic  pressure.  4. The mitral valve is grossly normal. Mild mitral valve regurgitation.  No evidence of mitral stenosis.  5. 30-mm Dacron Hemashield graft repair of ascending aorta 07/08/2012.  Aortic root/ascending aorta has been repaired/replaced.  6. The inferior vena cava is normal in size with greater than  50%  respiratory variability, suggesting right atrial pressure of 3 mmHg.   Comparison(s): Changes from prior study are noted.   Epic records are reviewed at length today  CHA2DS2-VASc Score = 3  The patient's score is based upon: CHF History: 0 HTN History: 0 Diabetes History: 0 Stroke History: 0 Vascular Disease History: 0 Age Score: 2 Gender Score: 1      ASSESSMENT AND PLAN: 1. Persistent Atrial Fibrillation/typical atrial flutter The patient's CHA2DS2-VASc score is 3, indicating a 3.2% annual risk of stroke.   S/p DCCV on 06/05/20 Patient appears to be maintaining SR. Continue Eliquis 2.5 mg BID (age, weight) Continue Lopressor 25 mg BID Could consider AAD if her afib becomes more persistent. Patient at her dry weight with no SOB, will stop Lasix at this time.   2. Secondary Hypercoagulable State (ICD10:  D68.69) The patient is at significant risk for stroke/thromboembolism based upon her CHA2DS2-VASc Score of 3.  Continue Apixaban (Eliquis).   3. AS/Aortic root dilatation Bioprosthetic valve 2004. Moderate to severe on recent echo Repeat echo scheduled.   Follow up with Dr [name] as scheduled. AF clinic as needed.    [facility] [address] [place], [place] [address] [phone] 06/12/2020 9:39 AM

## 2020-06-12 NOTE — Addendum Note (Signed)
Encounter addended by: Juluis Mire, RN on: 06/12/2020 10:01 AM  Actions taken: Order list changed

## 2020-06-13 DIAGNOSIS — H61303 Acquired stenosis of external ear canal, unspecified, bilateral: Secondary | ICD-10-CM | POA: Diagnosis not present

## 2020-06-13 DIAGNOSIS — H6123 Impacted cerumen, bilateral: Secondary | ICD-10-CM | POA: Diagnosis not present

## 2020-06-13 DIAGNOSIS — Z974 Presence of external hearing-aid: Secondary | ICD-10-CM | POA: Diagnosis not present

## 2020-06-13 DIAGNOSIS — H9193 Unspecified hearing loss, bilateral: Secondary | ICD-10-CM | POA: Diagnosis not present

## 2020-06-14 ENCOUNTER — Ambulatory Visit: Payer: Medicare Other | Admitting: Physician Assistant

## 2020-07-04 DIAGNOSIS — Z23 Encounter for immunization: Secondary | ICD-10-CM | POA: Diagnosis not present

## 2020-07-18 ENCOUNTER — Ambulatory Visit (INDEPENDENT_AMBULATORY_CARE_PROVIDER_SITE_OTHER): Payer: Medicare Other | Admitting: Cardiovascular Disease

## 2020-07-18 ENCOUNTER — Encounter: Payer: Self-pay | Admitting: Cardiovascular Disease

## 2020-07-18 ENCOUNTER — Other Ambulatory Visit: Payer: Self-pay

## 2020-07-18 VITALS — BP 120/60 | HR 58 | Ht 61.0 in | Wt 123.0 lb

## 2020-07-18 DIAGNOSIS — Z8679 Personal history of other diseases of the circulatory system: Secondary | ICD-10-CM

## 2020-07-18 DIAGNOSIS — I480 Paroxysmal atrial fibrillation: Secondary | ICD-10-CM | POA: Diagnosis not present

## 2020-07-18 DIAGNOSIS — E782 Mixed hyperlipidemia: Secondary | ICD-10-CM | POA: Diagnosis not present

## 2020-07-18 DIAGNOSIS — I712 Thoracic aortic aneurysm, without rupture: Secondary | ICD-10-CM

## 2020-07-18 DIAGNOSIS — I7121 Aneurysm of the ascending aorta, without rupture: Secondary | ICD-10-CM

## 2020-07-18 NOTE — Assessment & Plan Note (Signed)
History of aortic stenosis thoracic aorta aortic aneurysm resection and grafting using a 30 mm Hemashield straight graft January 2004 by Dr. Darcey Nora. I did a heart cath on her June 18, 2012 revealing normal coronary arteries and LV function. Her most recent 2D echocardiogram performed 01/19/2020 revealed normal LV size and function with moderate to severe AI although she is completely asymptomatic from this. She is scheduled for 2D echo tomorrow we will recheck this again in a year.

## 2020-07-18 NOTE — Patient Instructions (Signed)
Medication Instructions:  Your physician recommends that you continue on your current medications as directed. Please refer to the Current Medication list given to you today.  *If you need a refill on your cardiac medications before your next appointment, please call your pharmacy*  Procedures: Your physician has requested that you have an echocardiogram. Echocardiography is a painless test that uses sound waves to create images of your heart. It provides your doctor with information about the size and shape of your heart and how well your heart's chambers and valves are working. This procedure takes approximately one hour. There are no restrictions for this procedure. To be done Dec. 2022    Follow-Up: At [facility], you and your health needs are our priority.  As part of our continuing mission to provide you with exceptional heart care, we have created designated Provider Care Teams.  These Care Teams include your primary Cardiologist (physician) and Advanced Practice Providers (APPs -  Physician Assistants and Nurse Practitioners) who all work together to provide you with the care you need, when you need it.  We recommend signing up for the patient portal called "MyChart".  Sign up information is provided on this After Visit Summary.  MyChart is used to connect with patients for Virtual Visits (Telemedicine).  Patients are able to view lab/test results, encounter notes, upcoming appointments, etc.  Non-urgent messages can be sent to your provider as well.   To learn more about what you can do with MyChart, go to [email or web address].    Your next appointment:   12 month(s)  The format for your next appointment:   In Person  Provider:   Quay Burow, MD

## 2020-07-18 NOTE — Assessment & Plan Note (Signed)
History of PAF status post outpatient DC cardioversion by Dr. Radford Pax 06/05/2020 which was successful. Her last EKG performed on 06/12/2020 revealed sinus bradycardia 58. She remains on Eliquis oral anticoagulation.

## 2020-07-18 NOTE — Progress Notes (Signed)
[birth year] [name] [name]   [birth year]  [id number]  Primary Physician [name], MD Primary Cardiologist: Lorretta Harp MD Lupe Carney, [place]  HPI:  [name] [name] is a 79 y.o.   thin-appearing, married Caucasian female, mother of 33, grandmother to 1 grandchild whoI last saw in the office 01/25/2020.  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 Her prosthetic valve which was placed 10 years ago (January 2004) was resuspended. I had cath'd her June 18, 2012, revealing normal coronary arteries, normal LV function and normal right heart pressures. Her postop course was uncomplicated except for some minor A-fib/flutter which converted to sinus rhythm prior to discharge. She recovered nicely from her descending thoracic aortic aneurysm resection and grafting. I last saw her 11/24/15.Marland KitchenShe had 2 episodes of worrisome chest pain prior to that visitwhich were nitrate responsive. The pain radiated into her neck shoulders and back as well. A Myoview stress test performed 12/01/15 was low risk and not ischemic. A 2-D echo showed normal LV size and function, mild LVH with moderate aortic insufficiency. She's had no recurrent chest pain. She was diagnosed with gastric ulcers thought to be related to her low dose aspirin which has been discontinued.  Her husband of 26 years unfortunately died 09-07-2018 after a prolonged hospitalization and recovery from stroke and heart attack. She currently lives alone. She gets occasional atypical chest pain. She denies shortness of breath. 2D echo performed 12/16/2018 revealed normal LV systolic function with moderate aortic insufficiency and increase in her mean aortic valve gradient from 13 to 21 mmHg. The ascending graft was visualized as well.  Since I saw her in the office 6 months ago she did develop atrial fibrillation. She was seen in the A.  fib clinic by Malka So, placed on Eliquis and underwent successful outpatient DC cardioversion by Dr. Radford Pax on 06/05/2020. Her last EKG performed 06/12/2020 revealed sinus rhythm. She denies chest pain or shortness of breath. Her last 2D echo performed 01/19/2020 revealed normal LV size and function with moderate to severe AI and moderate AAS probably related to the AI with a valve area of 1.12 cm and a peak gradient of 42 mmHg.   Current Meds  Medication Sig  . apixaban (ELIQUIS) 2.5 MG TABS tablet Take 1 tablet (2.5 mg total) by mouth 2 (two) times daily.  . B Complex-C (B-COMPLEX WITH VITAMIN C) tablet Take 1 tablet by mouth daily.  . budesonide (RHINOCORT AQUA) 32 MCG/ACT nasal spray Place 1 spray into both nostrils as needed for allergies.   . Calcium Carbonate-Vit D-Min (CALTRATE MINIS PLUS MINERALS PO) Take 1 tablet by mouth every evening.  . Chlorpheniramine-PSE-Ibuprofen (ADVIL ALLERGY SINUS) 2-30-200 MG TABS Take 1-2 tablets by mouth as needed (allergies/sinus issues.).   [name] cholecalciferol (VITAMIN D) 25 MCG (1000 UNIT) tablet Take 1,000 Units by mouth every evening.  . metoprolol tartrate (LOPRESSOR) 25 MG tablet Take 1 tablet (25 mg total) by mouth 2 (two) times daily.  . Multiple Vitamin (MULTIVITAMIN WITH MINERALS) TABS tablet Take 1 tablet by mouth daily. Centrum for Women  . Multiple Vitamins-Minerals (OCUVITE ADULT FORMULA) CAPS Take 1 capsule by mouth every evening.   . pantoprazole (PROTONIX) 40 MG tablet TAKE ONE TABLET BY MOUTH ONCE DAILY BEFORE meal of choice (Patient taking differently: Take 40 mg by mouth daily.)  . pravastatin (PRAVACHOL) 10 MG tablet Take 10 mg by mouth every evening.  . SUMAtriptan (IMITREX)  100 MG tablet Take 100 mg by mouth every 2 (two) hours as needed for migraine. May repeat in 2 hours if headache persists or recurs.     Allergies  Allergen Reactions  . Other Nausea And Vomiting    Headache also  Also from artificial sweeetners  . Codeine  Nausea And Vomiting    Social History   Socioeconomic History  . Marital status: Married    Spouse name: Not on file  . Number of children: Not on file  . Years of education: Not on file  . Highest education level: Not on file  Occupational History  . Not on file  Tobacco Use  . Smoking status: Never Smoker  . Smokeless tobacco: Never Used  Substance and Sexual Activity  . Alcohol use: No  . Drug use: No  . Sexual activity: Not Currently  Other Topics Concern  . Not on file  Social History Narrative  . Not on file   Social Determinants of Health   Financial Resource Strain: Not on file  Food Insecurity: Not on file  Transportation Needs: Not on file  Physical Activity: Not on file  Stress: Not on file  Social Connections: Not on file  Intimate Partner Violence: Not on file     Review of Systems: General: negative for chills, fever, night sweats or weight changes.  Cardiovascular: negative for chest pain, dyspnea on exertion, edema, orthopnea, palpitations, paroxysmal nocturnal dyspnea or shortness of breath Dermatological: negative for rash Respiratory: negative for cough or wheezing Urologic: negative for hematuria Abdominal: negative for nausea, vomiting, diarrhea, bright red blood per rectum, melena, or hematemesis Neurologic: negative for visual changes, syncope, or dizziness All other systems reviewed and are otherwise negative except as noted above.    Blood pressure 120/60, pulse (!) 58, height 5\' 1"  (1.549 m), weight 123 lb (55.8 kg), SpO2 91 %.  General appearance: alert and no distress Neck: no adenopathy, no carotid bruit, no JVD, supple, symmetrical, trachea midline and thyroid not enlarged, symmetric, no tenderness/mass/nodules Lungs: clear to auscultation bilaterally Heart: 2/6 diastolic murmur heard best at the left upper sternal border consistent with AI Extremities: extremities normal, atraumatic, no cyanosis or edema Pulses: 2+ and  symmetric Skin: Skin color, texture, turgor normal. No rashes or lesions Neurologic: Alert and oriented X 3, normal strength and tone. Normal symmetric reflexes. 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 Darcey Nora. I did a heart cath on her June 18, 2012 revealing normal coronary arteries and LV function. Her most recent 2D echocardiogram performed 01/19/2020 revealed normal LV size and function with moderate to severe AI although she is completely asymptomatic from this. She is scheduled for 2D echo tomorrow we will recheck this again in a year.  Paroxysmal atrial fibrillation (HCC) History of PAF status post outpatient DC cardioversion by Dr. Radford Pax 06/05/2020 which was successful. Her last EKG performed on 06/12/2020 revealed sinus bradycardia 58. She remains on Eliquis oral anticoagulation.  Hyperlipidemia History of hyperlipidemia on low-dose Pravachol with lipid profile performed 03/03/2020 revealing total cholesterol of 171, LDL of 94 and HDL of 60.      Lorretta Harp MD FACP,FACC,FAHA, [facility] 07/18/2020 11:10 AM

## 2020-07-18 NOTE — Assessment & Plan Note (Signed)
History of hyperlipidemia on low-dose Pravachol with lipid profile performed 03/03/2020 revealing total cholesterol of 171, LDL of 94 and HDL of 60.

## 2020-07-19 ENCOUNTER — Ambulatory Visit (HOSPITAL_COMMUNITY): Payer: Medicare Other | Attending: Cardiovascular Disease

## 2020-07-19 DIAGNOSIS — Z952 Presence of prosthetic heart valve: Secondary | ICD-10-CM

## 2020-07-19 LAB — ECHOCARDIOGRAM COMPLETE
AR max vel: 1.07 cm2
AV Area VTI: 1.1 cm2
AV Area mean vel: 1.12 cm2
AV Mean grad: 22 mmHg
AV Peak grad: 39.9 mmHg
Ao pk vel: 3.16 m/s
Area-P 1/2: 3.34 cm2
P 1/2 time: 377 msec
S' Lateral: 3.2 cm

## 2020-07-21 ENCOUNTER — Ambulatory Visit: Payer: Medicare Other | Admitting: Cardiovascular Disease

## 2020-07-24 ENCOUNTER — Telehealth: Payer: Self-pay

## 2020-07-24 NOTE — Telephone Encounter (Signed)
[facility] pt assistance form completed and faxed for eliquis.  °

## 2020-08-29 ENCOUNTER — Telehealth: Payer: Self-pay | Admitting: Cardiovascular Disease

## 2020-08-29 MED ORDER — PANTOPRAZOLE SODIUM 40 MG PO TBEC
DELAYED_RELEASE_TABLET | ORAL | 3 refills | Status: DC
Start: 2020-08-29 — End: 2020-08-30

## 2020-08-29 NOTE — Telephone Encounter (Signed)
Pt's medication as sent to pt's pharmacy as requested. Confirmation received.

## 2020-08-29 NOTE — Telephone Encounter (Signed)
*  STAT* If patient is at the pharmacy, call can be transferred to refill team.   1. Which medications need to be refilled? (please list name of each medication and dose if known) pantoprazole (PROTONIX) 40 MG tablet  2. Which pharmacy/location (including street and city if local pharmacy) is medication to be sent to? [id number] - [place], [place] - [address]  3. Do they need a 30 day or 90 day supply? 90 day supply

## 2020-08-30 ENCOUNTER — Telehealth: Payer: Self-pay | Admitting: Cardiovascular Disease

## 2020-08-30 MED ORDER — PANTOPRAZOLE SODIUM 40 MG PO TBEC
DELAYED_RELEASE_TABLET | ORAL | 2 refills | Status: DC
Start: 2020-08-30 — End: 2021-11-20

## 2020-08-30 NOTE — Telephone Encounter (Signed)
New message:     Patient received a messege stating that her medication was not set to [facility]

## 2020-08-30 NOTE — Telephone Encounter (Signed)
Rx has been sent to the pharmacy electronically. To [facility] 08/30/20

## 2020-09-05 DIAGNOSIS — R0981 Nasal congestion: Secondary | ICD-10-CM | POA: Diagnosis not present

## 2020-09-05 DIAGNOSIS — R0602 Shortness of breath: Secondary | ICD-10-CM | POA: Diagnosis not present

## 2020-09-05 DIAGNOSIS — Z20828 Contact with and (suspected) exposure to other viral communicable diseases: Secondary | ICD-10-CM | POA: Diagnosis not present

## 2020-09-05 DIAGNOSIS — J189 Pneumonia, unspecified organism: Secondary | ICD-10-CM | POA: Diagnosis not present

## 2020-09-15 DIAGNOSIS — J4520 Mild intermittent asthma, uncomplicated: Secondary | ICD-10-CM | POA: Diagnosis not present

## 2020-09-15 DIAGNOSIS — R0600 Dyspnea, unspecified: Secondary | ICD-10-CM | POA: Diagnosis not present

## 2020-09-15 DIAGNOSIS — N183 Chronic kidney disease, stage 3 unspecified: Secondary | ICD-10-CM | POA: Diagnosis not present

## 2020-09-15 DIAGNOSIS — I5021 Acute systolic (congestive) heart failure: Secondary | ICD-10-CM | POA: Diagnosis not present

## 2020-09-15 DIAGNOSIS — I110 Hypertensive heart disease with heart failure: Secondary | ICD-10-CM | POA: Diagnosis not present

## 2020-09-15 DIAGNOSIS — J189 Pneumonia, unspecified organism: Secondary | ICD-10-CM | POA: Diagnosis not present

## 2020-09-15 DIAGNOSIS — I509 Heart failure, unspecified: Secondary | ICD-10-CM | POA: Diagnosis not present

## 2020-09-15 DIAGNOSIS — Z79899 Other long term (current) drug therapy: Secondary | ICD-10-CM | POA: Diagnosis not present

## 2020-09-15 DIAGNOSIS — Z20822 Contact with and (suspected) exposure to covid-19: Secondary | ICD-10-CM | POA: Diagnosis not present

## 2020-09-15 DIAGNOSIS — R0602 Shortness of breath: Secondary | ICD-10-CM | POA: Diagnosis not present

## 2020-09-15 DIAGNOSIS — R059 Cough, unspecified: Secondary | ICD-10-CM | POA: Diagnosis not present

## 2020-09-25 DIAGNOSIS — J45909 Unspecified asthma, uncomplicated: Secondary | ICD-10-CM | POA: Diagnosis not present

## 2020-09-25 DIAGNOSIS — I351 Nonrheumatic aortic (valve) insufficiency: Secondary | ICD-10-CM | POA: Diagnosis not present

## 2020-09-25 DIAGNOSIS — R0602 Shortness of breath: Secondary | ICD-10-CM | POA: Diagnosis not present

## 2020-09-25 DIAGNOSIS — R0600 Dyspnea, unspecified: Secondary | ICD-10-CM | POA: Diagnosis not present

## 2020-09-26 DIAGNOSIS — I712 Thoracic aortic aneurysm, without rupture: Secondary | ICD-10-CM | POA: Diagnosis not present

## 2020-09-26 DIAGNOSIS — R0602 Shortness of breath: Secondary | ICD-10-CM | POA: Diagnosis not present

## 2020-09-27 ENCOUNTER — Other Ambulatory Visit: Payer: Self-pay

## 2020-09-27 ENCOUNTER — Ambulatory Visit (INDEPENDENT_AMBULATORY_CARE_PROVIDER_SITE_OTHER): Payer: Medicare Other | Admitting: Cardiovascular Disease

## 2020-09-27 ENCOUNTER — Encounter: Payer: Self-pay | Admitting: Cardiovascular Disease

## 2020-09-27 VITALS — BP 138/60 | HR 71 | Resp 16 | Ht 61.0 in | Wt 117.6 lb

## 2020-09-27 DIAGNOSIS — I4819 Other persistent atrial fibrillation: Secondary | ICD-10-CM

## 2020-09-27 DIAGNOSIS — Z952 Presence of prosthetic heart valve: Secondary | ICD-10-CM | POA: Diagnosis not present

## 2020-09-27 DIAGNOSIS — Z8679 Personal history of other diseases of the circulatory system: Secondary | ICD-10-CM | POA: Diagnosis not present

## 2020-09-27 DIAGNOSIS — I7121 Aneurysm of the ascending aorta, without rupture: Secondary | ICD-10-CM

## 2020-09-27 DIAGNOSIS — I712 Thoracic aortic aneurysm, without rupture: Secondary | ICD-10-CM

## 2020-09-27 MED ORDER — APIXABAN 2.5 MG PO TABS
2.5000 mg | ORAL_TABLET | Freq: Two times a day (BID) | ORAL | 11 refills | Status: DC
Start: 2020-09-27 — End: 2021-08-20

## 2020-09-27 NOTE — Progress Notes (Signed)
Ms. Wingler returns today at the request of her PCP, Dr. Jeryl Columbia, because of an auscultated murmur.  She does have a history of aortic valve replacement back in 2004 with resuspension in 2013, for thoracic aortic aneurysm resection and grafting.  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  She has normal LV size and function.  Her 2D echoes have remained stable over the years most recently performed 07/19/2020 which is unchanged from her previous one done in June.  She does have an AI/AI murmur on exam.  She says she is feeling better from her pneumonia which was diagnosed at the end of January on antibiotics and steroids as well as inhaled bronchodilators.  At this point, I do not feel that there is anything different from a cardiovascular point of view.  I am going to recheck a 2D echo in 6 months and will see her back after that follow-up.  Her EKG shows sinus rhythm at 71 with borderline LVH by voltage and early R wave transition.  Lorretta Harp, M.D., [place], [facility], [place] [address] [place], [place]  [address]  [phone] 09/27/2020 2:40 PM

## 2020-09-27 NOTE — Patient Instructions (Signed)
Medication Instructions:  Your physician recommends that you continue on your current medications as directed. Please refer to the Current Medication list given to you today.  *If you need a refill on your cardiac medications before your next appointment, please call your pharmacy*   Testing/Procedures: Your physician has requested that you have an echocardiogram. Echocardiography is a painless test that uses sound waves to create images of your heart. It provides your doctor with information about the size and shape of your heart and how well your heart's chambers and valves are working. This procedure takes approximately one hour. There are no restrictions for this procedure. This procedure is performed at [address] 3rd. To be done in Aug on 2022.    Follow-Up: At [facility], you and your health needs are our priority.  As part of our continuing mission to provide you with exceptional heart care, we have created designated Provider Care Teams.  These Care Teams include your primary Cardiologist (physician) and Advanced Practice Providers (APPs -  Physician Assistants and Nurse Practitioners) who all work together to provide you with the care you need, when you need it.  We recommend signing up for the patient portal called "MyChart".  Sign up information is provided on this After Visit Summary.  MyChart is used to connect with patients for Virtual Visits (Telemedicine).  Patients are able to view lab/test results, encounter notes, upcoming appointments, etc.  Non-urgent messages can be sent to your provider as well.   To learn more about what you can do with MyChart, go to [email or web address].    Your next appointment:   6 month(s)  The format for your next appointment:   In Person  Provider:   Quay Burow, MD

## 2020-10-06 DIAGNOSIS — I351 Nonrheumatic aortic (valve) insufficiency: Secondary | ICD-10-CM | POA: Diagnosis not present

## 2020-10-06 DIAGNOSIS — R0602 Shortness of breath: Secondary | ICD-10-CM | POA: Diagnosis not present

## 2020-10-06 DIAGNOSIS — Z952 Presence of prosthetic heart valve: Secondary | ICD-10-CM | POA: Diagnosis not present

## 2020-10-06 DIAGNOSIS — J45909 Unspecified asthma, uncomplicated: Secondary | ICD-10-CM | POA: Diagnosis not present

## 2020-10-13 DIAGNOSIS — Z6824 Body mass index (BMI) 24.0-24.9, adult: Secondary | ICD-10-CM | POA: Diagnosis not present

## 2020-10-13 DIAGNOSIS — J45909 Unspecified asthma, uncomplicated: Secondary | ICD-10-CM | POA: Diagnosis not present

## 2020-10-13 DIAGNOSIS — J302 Other seasonal allergic rhinitis: Secondary | ICD-10-CM | POA: Diagnosis not present

## 2020-10-13 DIAGNOSIS — I509 Heart failure, unspecified: Secondary | ICD-10-CM | POA: Diagnosis not present

## 2020-10-24 DIAGNOSIS — H26493 Other secondary cataract, bilateral: Secondary | ICD-10-CM | POA: Diagnosis not present

## 2020-11-01 ENCOUNTER — Encounter: Payer: Self-pay | Admitting: Internal Medicine

## 2020-11-01 ENCOUNTER — Other Ambulatory Visit: Payer: Self-pay

## 2020-11-01 ENCOUNTER — Ambulatory Visit (INDEPENDENT_AMBULATORY_CARE_PROVIDER_SITE_OTHER): Payer: Medicare Other | Admitting: Internal Medicine

## 2020-11-01 DIAGNOSIS — J45991 Cough variant asthma: Secondary | ICD-10-CM | POA: Diagnosis not present

## 2020-11-01 NOTE — Assessment & Plan Note (Signed)
Onset of rhinitis worse in spring "all her life" with allergy testing Pos to everything x horses  -  Acutely worse with mostly upper airway symptoms with viral uri Feb 2022 and all symptoms resolved by initial pulmonary eval 11/01/2020   - The proper method of use, as well as anticipated side effects, of a metered-dose inhaler were discussed and demonstrated to the patient using teach back method.   Hard to be sure but the loss of voice with sob strongly suggests at least a component of Upper airway cough syndrome (previously labeled PNDS),  is so named because it's frequently impossible to sort out how much is  CR/sinusitis with freq throat clearing (which can be related to primary GERD)   vs  causing  secondary (" extra esophageal")  GERD from wide swings in gastric pressure that occur with throat clearing, often  promoting self use of mint and menthol lozenges that reduce the lower esophageal sphincter tone and exacerbate the problem further in a cyclical fashion.   These are the same pts (now being labeled as having "irritable larynx syndrome" by some cough centers) who not infrequently have a history of having failed to tolerate ace inhibitors,  dry powder inhalers or biphosphonates or report having atypical/extraesophageal reflux symptoms that don't respond to standard doses of PPI  and are easily confused as having aecopd or asthma flares by even experienced allergists/ pulmonologists (myself included).   In event of recurrence ok to use saba prn but max rx for gerd should start on day #1 with call to this office for eval for active symptoms to sort this out.    In meantime prn saba reviewed: Only use your albuterol as a rescue medication to be used if you can't catch your breath by resting or doing a relaxed purse lip breathing pattern.  - The less you use it, the better it will work when you need it. - Ok to use up to 2 puffs  every 4 hours if you must but call for immediate appointment if use  goes up over your usual need - Don't leave home without it !!  (think of it like the spare tire for your car)           Each maintenance medication was reviewed in detail including emphasizing most importantly the difference between maintenance and prns and under what circumstances the prns are to be triggered using an action plan format where appropriate.  Total time for H and P, chart review, counseling, reviewing hfa device(s) and generating customized AVS unique to this office visit / same day charting = 49 min

## 2020-11-01 NOTE — Patient Instructions (Signed)
Next time you have a problem with your throat I recommend  Increase your protonix to Take 30- 60 min before your first and last meals of the day   GERD (REFLUX)  is an extremely common cause of respiratory symptoms just like yours , many times with no obvious heartburn at all.    It can be treated with medication, but also with lifestyle changes including elevation of the head of your bed (ideally with 6 -8inch blocks under the headboard of your bed),  Smoking cessation, avoidance of late meals, excessive alcohol, and avoid fatty foods, chocolate, peppermint, colas, red wine, and acidic juices such as orange juice.  NO MINT OR MENTHOL PRODUCTS SO NO COUGH DROPS  USE SUGARLESS CANDY INSTEAD (Jolley ranchers or Stover's or Life Savers) or even ice chips will also do - the key is to swallow to prevent all throat clearing. NO OIL BASED VITAMINS - use powdered substitutes.  Avoid fish oil when coughing.   Only use your albuterol as a rescue medication to be used if you can't catch your breath by resting or doing a relaxed purse lip breathing pattern.  - The less you use it, the better it will work when you need it. - Ok to use up to 2 puffs  every 4 hours if you must but call for immediate appointment if use goes up over your usual need - Don't leave home without it !!  (think of it like the spare tire for your car)   Pulmonary follow up is as needed

## 2020-11-01 NOTE — Progress Notes (Signed)
[name] [name], female    DOB: 08/06/1940      MRN: [id number]   Brief patient profile:  27 yowf never smoker bothered by year round rhinitis "all her life"  worse in spring allergy eval as young adult "everything but horses" s/p covid 19 vax x 3 then feb 2022 sense of throat mucus/ tightness/ couldn't speak> multiple eval with neg cxr 09/15/20   and  Then CT from 2/22//2022 nl p abx/ steroids  And reported "all better" by time of 1st pulmonary eval referred to pulmonary clinic 11/01/2020 by Dr  Jeryl Columbia      History of Present Illness  11/01/2020  Pulmonary/ 1st office eval/[name] [name]  Chief Complaint  Patient presents with  . Consult    Ref by primary-Pneumonia 1 mth. Ago, sob off/on now,was on Prednisone taper for 3 rounds(finished last one 2 wks.)  Dyspnea:  Not limited by breathing from desired activities   Cough: none Sleep: on bed flat/ 2 pillows  SABA use: none  ? Helped some at the time  No obvious day to day or daytime variability or assoc excess/ purulent sputum or mucus plugs or hemoptysis or cp or chest tightness, subjective wheeze or overt sinus or hb symptoms.   Sleeping as above without nocturnal  or early am exacerbation  of respiratory  c/o's or need for noct saba. Also denies any obvious fluctuation of symptoms with weather or environmental changes or other aggravating or alleviating factors except as outlined above   No unusual exposure hx or h/o childhood pna/ asthma or knowledge of premature birth.  Current Allergies, Complete Past Medical History, Past Surgical History, Family History, and Social History were reviewed in [employer] record.  ROS  The following are not active complaints unless bolded Hoarseness, sore throat, dysphagia, dental problems, itching, sneezing,  nasal congestion or discharge of excess mucus or purulent secretions, ear ache,   fever, chills, sweats, unintended wt loss or wt gain, classically pleuritic or exertional cp,   orthopnea pnd or arm/hand swelling  or leg swelling, presyncope, palpitations, abdominal pain, anorexia, nausea, vomiting, diarrhea  or change in bowel habits or change in bladder habits, change in stools or change in urine, dysuria, hematuria,  rash, arthralgias, visual complaints, headache, numbness, weakness or ataxia or problems with walking or coordination,  change in mood or  memory.             Past Medical History:  Diagnosis Date  . 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 Arthritis   . 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 Dysrhythmia   . GERD (gastroesophageal reflux disease)   . H/O aortic valve disorder 10/04/2002   Dr. Tharon Aquas Trigt  . Headache(784.0)   . Heart murmur   . History of migraine headaches   . Hyperlipidemia    on statin therapy  . Migraines   . Thoracic ascending aortic aneurysm [facility])    CTA CHEST 05/11/12    Outpatient Medications Prior to Visit  Medication Sig Dispense Refill  . apixaban (ELIQUIS) 2.5 MG TABS tablet Take 1 tablet (2.5 mg total) by mouth 2 (two) times daily. 60 tablet 11  . B Complex-C (B-COMPLEX WITH VITAMIN C) tablet Take 1 tablet by mouth daily.    . Calcium Carbonate-Vit D-Min (CALTRATE MINIS PLUS MINERALS PO) Take 1 tablet by mouth every evening.    . cholecalciferol (VITAMIN D) 25 MCG (1000 UNIT) tablet  Take 1,000 Units by mouth every evening.    . metoprolol tartrate (LOPRESSOR) 25 MG tablet Take 1 tablet (25 mg total) by mouth 2 (two) times daily. 180 tablet 3  . Multiple Vitamin (MULTIVITAMIN WITH MINERALS) TABS tablet Take 1 tablet by mouth daily. Centrum for Women    . Multiple Vitamins-Minerals (OCUVITE ADULT FORMULA) CAPS Take 1 capsule by mouth every evening.     . pantoprazole (PROTONIX) 40 MG tablet TAKE ONE TABLET BY MOUTH ONCE DAILY BEFORE meal of choice 90 tablet 2  . pravastatin (PRAVACHOL) 10 MG tablet Take 10 mg by mouth every evening.    . SUMAtriptan  (IMITREX) 100 MG tablet Take 100 mg by mouth every 2 (two) hours as needed for migraine. May repeat in 2 hours if headache persists or recurs.    [name] amoxicillin (AMOXIL) 500 MG capsule Take 500 mg by mouth 3 (three) times daily. (Patient not taking: Reported on 11/01/2020)    . budesonide (RHINOCORT AQUA) 32 MCG/ACT nasal spray Place 1 spray into both nostrils as needed for allergies.  (Patient not taking: Reported on 11/01/2020)    . Chlorpheniramine-PSE-Ibuprofen (ADVIL ALLERGY SINUS) 2-30-200 MG TABS Take 1-2 tablets by mouth as needed (allergies/sinus issues.).  (Patient not taking: Reported on 11/01/2020)    . furosemide (LASIX) 20 MG tablet Take 20 mg by mouth once. (Patient not taking: Reported on 11/01/2020)     No facility-administered medications prior to visit.     Objective:     BP 128/68 (BP Location: Left Arm, Cuff Size: Normal)   Pulse 66   Temp 97.9 F (36.6 C) (Temporal)   Ht 5\' 1"  (1.549 m)   Wt 122 lb 6.4 oz (55.5 kg)   SpO2 95%   BMI 23.13 kg/m   SpO2: 95 %   amb pleasant wf < stated age    HEENT : pt wearing mask not removed for exam due to covid -19 concerns.    NECK :  without JVD/Nodes/TM/ nl carotid upstrokes bilaterally   LUNGS: no acc muscle use,  Nl contour chest which is clear to A and P bilaterally without cough on insp or exp maneuvers   CV:  RRR  No S3/  3/6 sem s increase in P2, and no edema   ABD:  soft and nontender with nl inspiratory excursion in the supine position. No bruits or organomegaly appreciated, bowel sounds nl  MS:  Nl gait/ ext warm without deformities, calf tenderness, cyanosis or clubbing No obvious joint restrictions   SKIN: warm and dry without lesions    NEURO:  alert, approp, nl sensorium with  no motor or cerebellar deficits apparent.     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   Hard to be sure but the loss of voice with sob strongly suggests at least a component of Upper airway cough syndrome (previously labeled PNDS),  is so named because it's frequently impossible to sort out how much  is  CR/sinusitis with freq throat clearing (which can be related to primary GERD)   vs  causing  secondary (" extra esophageal")  GERD from wide swings in gastric pressure that occur with throat clearing, often  promoting self use of mint and menthol lozenges that reduce the lower esophageal sphincter tone and exacerbate the problem further in a cyclical fashion.   These are the same pts (now being labeled as having "irritable larynx syndrome" by some cough centers) who not infrequently have a history of having failed to tolerate ace inhibitors,  dry powder inhalers or biphosphonates or report having atypical/extraesophageal reflux symptoms that don't respond to standard doses of PPI  and are easily confused as having aecopd or asthma flares by even experienced allergists/ pulmonologists (myself included).   In event of recurrence ok to use saba prn but max rx for gerd should start on day #1 with call to this office for eval for active symptoms to sort this out.    In meantime prn saba reviewed: Only use your albuterol as a rescue medication to be used if you can't catch your breath by resting or doing a relaxed purse lip breathing pattern.  - The less you use it, the better it will work when you need it. - Ok to use up to 2 puffs  every 4 hours if you must but call for immediate appointment if use goes up  over your usual need - Don't leave home without it !!  (think of it like the spare tire for your car)           Each maintenance medication was reviewed in detail including emphasizing most importantly the difference between maintenance and prns and under what circumstances the prns are to be triggered using an action plan format where appropriate.  Total time for H and P, chart review, counseling, reviewing hfa device(s) and generating customized AVS unique to this office visit / same day charting = 49 min           Christinia Gully, MD 11/01/2020

## 2020-12-20 ENCOUNTER — Ambulatory Visit (HOSPITAL_COMMUNITY)
Admission: RE | Admit: 2020-12-20 | Discharge: 2020-12-20 | Disposition: A | Discharge status: Home / Self Care | Payer: Medicare Other | Source: Ambulatory Visit | Attending: Physician Assistant | Admitting: Physician Assistant

## 2020-12-20 ENCOUNTER — Other Ambulatory Visit: Payer: Self-pay

## 2020-12-20 ENCOUNTER — Encounter (HOSPITAL_COMMUNITY): Payer: Self-pay | Admitting: Physician Assistant

## 2020-12-20 VITALS — BP 106/60 | HR 97 | Ht 61.0 in | Wt 119.0 lb

## 2020-12-20 DIAGNOSIS — I4819 Other persistent atrial fibrillation: Secondary | ICD-10-CM | POA: Insufficient documentation

## 2020-12-20 DIAGNOSIS — Z952 Presence of prosthetic heart valve: Secondary | ICD-10-CM | POA: Insufficient documentation

## 2020-12-20 DIAGNOSIS — I4892 Unspecified atrial flutter: Secondary | ICD-10-CM | POA: Insufficient documentation

## 2020-12-20 DIAGNOSIS — I483 Typical atrial flutter: Secondary | ICD-10-CM | POA: Diagnosis not present

## 2020-12-20 DIAGNOSIS — D6869 Other thrombophilia: Secondary | ICD-10-CM | POA: Diagnosis not present

## 2020-12-20 DIAGNOSIS — Z7901 Long term (current) use of anticoagulants: Secondary | ICD-10-CM | POA: Insufficient documentation

## 2020-12-20 DIAGNOSIS — E785 Hyperlipidemia, unspecified: Secondary | ICD-10-CM | POA: Insufficient documentation

## 2020-12-20 DIAGNOSIS — Z79899 Other long term (current) drug therapy: Secondary | ICD-10-CM | POA: Insufficient documentation

## 2020-12-20 LAB — COMPREHENSIVE METABOLIC PANEL
ALT: 13 U/L (ref 0–44)
AST: 20 U/L (ref 15–41)
Albumin: 4 g/dL (ref 3.5–5.0)
Alkaline Phosphatase: 52 U/L (ref 38–126)
Anion gap: 7 (ref 5–15)
BUN: 19 mg/dL (ref 8–23)
CO2: 26 mmol/L (ref 22–32)
Calcium: 9.6 mg/dL (ref 8.9–10.3)
Chloride: 105 mmol/L (ref 98–111)
Creatinine, Ser: 1.48 mg/dL — ABNORMAL HIGH (ref 0.44–1.00)
GFR, Estimated: 35 mL/min — ABNORMAL LOW (ref >60)
Glucose, Bld: 96 mg/dL (ref 70–99)
Potassium: 4.9 mmol/L (ref 3.5–5.1)
Sodium: 138 mmol/L (ref 135–145)
Total Bilirubin: 1.1 mg/dL (ref 0.3–1.2)
Total Protein: 6.8 g/dL (ref 6.5–8.1)

## 2020-12-20 LAB — TSH: TSH: 2.414 u[IU]/mL (ref 0.350–4.500)

## 2020-12-20 MED ORDER — AMIODARONE HCL 200 MG PO TABS: ORAL_TABLET | ORAL | 0 refills | Status: DC

## 2020-12-20 NOTE — Progress Notes (Signed)
Primary Care Physician: [name], MD Primary Cardiologist: Dr [name] Primary Electrophysiologist: none Referring Physician: [facility] triage/Dr Viviana Simpler Karp is a 80 y.o. female with a history of thoracic aortic aneurysm and AS s/p repair 2004, paroxysmal atrial fibrillation, atrial flutter who presents for follow up in the [facility]. The patient was initially diagnosed with atrial fibrillation in 2004 in the setting of her valve surgery. She had done well since that time with no known recurrence of afib. Patient has a CHADS2VASC score of 3. Patient was in her usual state of health until 05/14/20 when she began having heart "fluttering." These symptoms felt similar to her palpitations after her surgery. ECG 05/15/20 shows afib with RVR. There were no triggers that she could identify. She was started on Eliquis for stroke prevention. Patient is s/p DCCV on 06/05/20.   On follow up today, patient reports that she started having palpitations yesterday associated with SOB. ECG confirms she is in atrial flutter. There were no specific triggers that she could identify.   Today, she denies symptoms of chest pain, orthopnea, PND, lower extremity edema, dizziness, presyncope, syncope, snoring, daytime somnolence, bleeding, or neurologic sequela. The patient is tolerating medications without difficulties and is otherwise without complaint today.    Atrial Fibrillation Risk Factors:  she does not have symptoms or diagnosis of sleep apnea. she does not have a history of rheumatic fever. she does not have a history of alcohol use.   she has a BMI of Body mass index is 22.48 kg/m.[name] Filed Weights   12/20/20 1141  Weight: 54 kg    No family history on file.   Atrial Fibrillation Management history:  Previous antiarrhythmic drugs: amiodarone Previous cardioversions: 2013, 06/05/20 Previous ablations: none CHADS2VASC score: 3 Anticoagulation history:  Eliquis   Past Medical History:  Diagnosis Date  . Aortic root dilatation ([place]) 05/11/12   CTA CHEST  . Arthritis   . 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 Dysrhythmia   . GERD (gastroesophageal reflux disease)   . H/O aortic valve disorder 10/04/2002   Dr. Tharon Aquas Trigt  . Headache(784.0)   . Heart murmur   . History of migraine headaches   . Hyperlipidemia    on statin therapy  . Migraines   . Thoracic ascending aortic aneurysm [facility])    CTA CHEST 05/11/12   Past Surgical History:  Procedure Laterality Date  . ABDOMINAL HYSTERECTOMY    . AORTIC VALVE REPLACEMENT  10/04/2002    Dr. Tharon Aquas Trigt    #21 pericardial stented valve  . CARDIOVERSION  07/15/2012   Procedure: CARDIOVERSION;  Surgeon: Pixie Casino, MD;  Location: [facility] ENDOSCOPY;  Service: Cardiovascular;  Laterality: N/A;  . CARDIOVERSION N/A 06/05/2020   Procedure: CARDIOVERSION;  Surgeon: Sueanne Margarita, MD;  Location: [facility] ENDOSCOPY;  Service: Cardiovascular;  Laterality: N/A;  . 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 LEFT AND RIGHT HEART CATHETERIZATION WITH CORONARY ANGIOGRAM N/A 06/18/2012   Procedure: LEFT AND RIGHT HEART CATHETERIZATION WITH CORONARY ANGIOGRAM;  Surgeon: Lorretta Harp, MD;  Location: [facility] CATH LAB;  Service: Cardiovascular;  Laterality: N/A;  . NM MYOCAR PERF WALL MOTION  10/29/2010   normal  . REPLACEMENT ASCENDING AORTA  07/07/2012   Procedure: REPLACEMENT ASCENDING AORTA;  Surgeon: Ivin Poot, MD;  Location: [place];  Service: Open Heart Surgery;  Laterality: N/A;  CIRCULATORY  ARREST  . TEE WITHOUT CARDIOVERSION  06/18/2012   Procedure: TRANSESOPHAGEAL ECHOCARDIOGRAM (TEE);  Surgeon: Sanda Klein, MD;  Location: [facility] ENDOSCOPY;  Service: Cardiovascular;  Laterality: N/A;  right and left heart cath after this procedure  . TEE WITHOUT CARDIOVERSION  07/15/2012   Procedure: TRANSESOPHAGEAL ECHOCARDIOGRAM (TEE);  Surgeon:  Pixie Casino, MD;  Location: [facility] ENDOSCOPY;  Service: Cardiovascular;  Laterality: N/A;  . TONSILLECTOMY      Current Outpatient Medications  Medication Sig Dispense Refill  . albuterol (VENTOLIN HFA) 108 (90 Base) MCG/ACT inhaler Inhale 2 puffs into the lungs every 6 (six) hours as needed for wheezing or shortness of breath.    [name] apixaban (ELIQUIS) 2.5 MG TABS tablet Take 1 tablet (2.5 mg total) by mouth 2 (two) times daily. 60 tablet 11  . azelastine (ASTELIN) 0.1 % nasal spray Place 2 sprays into both nostrils 2 (two) times daily.    . B Complex-C (B-COMPLEX WITH VITAMIN C) tablet Take 1 tablet by mouth daily.    . Calcium Carbonate-Vit D-Min (CALTRATE MINIS PLUS MINERALS PO) Take 1 tablet by mouth every evening.    . cholecalciferol (VITAMIN D) 25 MCG (1000 UNIT) tablet Take 1,000 Units by mouth every evening.    . metoprolol tartrate (LOPRESSOR) 25 MG tablet Take 1 tablet (25 mg total) by mouth 2 (two) times daily. 180 tablet 3  . Multiple Vitamins-Minerals (PRESERVISION AREDS 2+MULTI VIT) CAPS Take by mouth in the morning and at bedtime.    . pantoprazole (PROTONIX) 40 MG tablet TAKE ONE TABLET BY MOUTH ONCE DAILY BEFORE meal of choice 90 tablet 2  . pravastatin (PRAVACHOL) 10 MG tablet Take 10 mg by mouth every evening.    . SUMAtriptan (IMITREX) 100 MG tablet Take 100 mg by mouth every 2 (two) hours as needed for migraine. May repeat in 2 hours if headache persists or recurs.     No current facility-administered medications for this encounter.    Allergies  Allergen Reactions  . Other Nausea And Vomiting    Headache also  Also from artificial sweeetners  . Codeine Nausea And Vomiting    Social History   Socioeconomic History  . Marital status: Married    Spouse name: Not on file  . Number of children: Not on file  . Years of education: Not on file  . Highest education level: Not on file  Occupational History  . Not on file  Tobacco Use  . Smoking status: Never  Smoker  . Smokeless tobacco: Never Used  Substance and Sexual Activity  . Alcohol use: No  . Drug use: No  . Sexual activity: Not Currently  Other Topics Concern  . Not on file  Social History Narrative  . Not on file   Social Determinants of Health   Financial Resource Strain: Not on file  Food Insecurity: Not on file  Transportation Needs: Not on file  Physical Activity: Not on file  Stress: Not on file  Social Connections: Not on file  Intimate Partner Violence: Not on file     ROS- All systems are reviewed and negative except as per the HPI above.  Physical Exam: Vitals:   12/20/20 1141  BP: 106/60  Pulse: 97  SpO2: 94%  Weight: 54 kg  Height: 5\' 1"  (1.549 m)    GEN- The patient is a well appearing elderly female, alert and oriented x 3 today.   HEENT-head normocephalic, atraumatic, sclera clear, conjunctiva pink, hearing intact, trachea midline. Lungs- Clear to ausculation  bilaterally, normal work of breathing Heart- irregular rate and rhythm, no rubs or gallops, 2/6 diastolic murmur  GI- soft, NT, ND, + BS Extremities- no clubbing, cyanosis, or edema MS- no significant deformity or atrophy Skin- no rash or lesion Psych- euthymic mood, full affect Neuro- strength and sensation are intact   Wt Readings from Last 3 Encounters:  12/20/20 54 kg  11/01/20 55.5 kg  09/27/20 53.3 kg    EKG today demonstrates  Atrial flutter with variable block Vent. rate 97 BPM PR interval * ms QRS duration 100 ms QT/QTcB 346/439 ms  Echo 07/19/20 demonstrated  1. Left ventricular ejection fraction, by estimation, is 60 to 65%. The  left ventricle has normal function. The left ventricle has no regional  wall motion abnormalities. There is mild left ventricular hypertrophy.  Left ventricular diastolic parameters were normal.  2. Right ventricular systolic function is normal. The right ventricular  size is normal.  3. The mitral valve is degenerative. Mild mitral valve  regurgitation. No  evidence of mitral stenosis. Moderate mitral annular calcification.  4. History of stented bioprosthetic tissue valve resuspended during  aortic root graft repair Leaflets are thickened with overall moderate  appearing AS with gradients elevated also from severe central AR no PVL Gradients similar to TTE done June 2021 with preserved LV size and function . The aortic valve has been repaired/replaced. Aortic valve regurgitation is severe. Moderate aortic valve stenosis.  5. Post repair with 30 mm Hemi Shield Graft . Aortic root/ascending aorta has been repaired/replaced.  6. The inferior vena cava is normal in size with greater than 50%  respiratory variability, suggesting right atrial pressure of 3 mmHg.   Epic records are reviewed at length today  CHA2DS2-VASc Score = 3  The patient's score is based upon: CHF History: No HTN History: No Diabetes History: No Stroke History: No Vascular Disease History: No Age Score: 2 Gender Score: 1      ASSESSMENT AND PLAN: 1. Persistent Atrial Fibrillation/typical atrial flutter The patient's CHA2DS2-VASc score is 3, indicating a 3.2% annual risk of stroke.   Patient in atrial flutter today. We discussed therapeutic options today including AAD vs DCCV alone. She is not eager to have repeat DCCV as her first was complicated by asystole and her second was complicated by flash pulmonary edema. Her CrCl is borderline too low for dofetilide. Will start amiodarone 200 mg BID for 2 weeks then decrease to 200 mg daily. Check cmet/TSH Continue Eliquis 2.5 mg BID (age, weight) Continue Lopressor 25 mg BID  2. Secondary Hypercoagulable State (ICD10:  D68.69) The patient is at significant risk for stroke/thromboembolism based upon her CHA2DS2-VASc Score of 3.  Continue Apixaban (Eliquis).   3. AS/Aortic root dilatation Bioprosthetic valve 2004. Mod AS/severe AI Followed by Dr [name].   Follow up in the AF clinic in 2  weeks.   [facility] [address] [place], [place] [address] [phone] 12/20/2020 11:43 AM

## 2020-12-20 NOTE — Patient Instructions (Signed)
Start Amiodarone 200mg  twice a day for the next 14 days (with food) then reduce to 200mg  once a day

## 2020-12-25 ENCOUNTER — Telehealth (HOSPITAL_COMMUNITY): Payer: Self-pay | Admitting: *Deleted

## 2020-12-25 DIAGNOSIS — Z20828 Contact with and (suspected) exposure to other viral communicable diseases: Secondary | ICD-10-CM | POA: Diagnosis not present

## 2020-12-25 DIAGNOSIS — R0902 Hypoxemia: Secondary | ICD-10-CM | POA: Diagnosis not present

## 2020-12-25 DIAGNOSIS — R509 Fever, unspecified: Secondary | ICD-10-CM | POA: Diagnosis not present

## 2020-12-25 DIAGNOSIS — M791 Myalgia, unspecified site: Secondary | ICD-10-CM | POA: Diagnosis not present

## 2020-12-25 MED ORDER — AMIODARONE HCL 200 MG PO TABS: 200.0000 mg | ORAL_TABLET | Freq: Every day | ORAL | 0 refills | Status: DC

## 2020-12-25 NOTE — Telephone Encounter (Signed)
Pt called in stating she developed shortness of breath, sore throat and skin sensitivity she attempted to call after hours but was never called back so pt decided to stop amiodarone. She continued with symptoms did present for covid testing (which was negative).  Pt will resume amiodarone at 200mg  once a day and call with update later this week. If sore throat continues encouraged pt to contact PCP. Pt in agreement.

## 2021-01-03 ENCOUNTER — Encounter (HOSPITAL_COMMUNITY): Payer: Self-pay | Admitting: Physician Assistant

## 2021-01-03 ENCOUNTER — Other Ambulatory Visit: Payer: Self-pay

## 2021-01-03 ENCOUNTER — Ambulatory Visit (HOSPITAL_COMMUNITY)
Admission: RE | Admit: 2021-01-03 | Discharge: 2021-01-03 | Disposition: A | Discharge status: Home / Self Care | Payer: Medicare Other | Source: Ambulatory Visit | Attending: Physician Assistant | Admitting: Physician Assistant

## 2021-01-03 VITALS — BP 110/72 | HR 55 | Ht 61.0 in | Wt 119.4 lb

## 2021-01-03 DIAGNOSIS — Z7901 Long term (current) use of anticoagulants: Secondary | ICD-10-CM | POA: Diagnosis not present

## 2021-01-03 DIAGNOSIS — R001 Bradycardia, unspecified: Secondary | ICD-10-CM | POA: Diagnosis not present

## 2021-01-03 DIAGNOSIS — Z79899 Other long term (current) drug therapy: Secondary | ICD-10-CM | POA: Insufficient documentation

## 2021-01-03 DIAGNOSIS — Z952 Presence of prosthetic heart valve: Secondary | ICD-10-CM | POA: Insufficient documentation

## 2021-01-03 DIAGNOSIS — D6869 Other thrombophilia: Secondary | ICD-10-CM

## 2021-01-03 DIAGNOSIS — I4819 Other persistent atrial fibrillation: Secondary | ICD-10-CM

## 2021-01-03 DIAGNOSIS — Z885 Allergy status to narcotic agent status: Secondary | ICD-10-CM | POA: Diagnosis not present

## 2021-01-03 DIAGNOSIS — I483 Typical atrial flutter: Secondary | ICD-10-CM | POA: Insufficient documentation

## 2021-01-03 NOTE — Progress Notes (Signed)
Primary Care Physician: [name], MD Primary Cardiologist: Dr [name] Primary Electrophysiologist: none Referring Physician: [facility] triage/Dr [name] [name] is a 80 y.o. female with a history of thoracic aortic aneurysm and AS s/p repair 2004, paroxysmal atrial fibrillation, atrial flutter who presents for follow up in the [facility]. The patient was initially diagnosed with atrial fibrillation in 2004 in the setting of her valve surgery. She had done well since that time with no known recurrence of afib. Patient has a CHADS2VASC score of 3. Patient was in her usual state of health until 05/14/20 when she began having heart "fluttering." These symptoms felt similar to her palpitations after her surgery. ECG 05/15/20 shows afib with RVR. There were no triggers that she could identify. She was started on Eliquis for stroke prevention. Patient is s/p DCCV on 06/05/20.   On follow up today, patient reports she felt she was back in SR 3 days after starting amiodarone with a regular pulse. Of note, she was diagnosed with community acquired PNA and just finished a course of antibiotics. She is slowly feeling better but is still fatigued.   Today, she denies symptoms of palpitations, chest pain, orthopnea, PND, lower extremity edema, dizziness, presyncope, syncope, snoring, daytime somnolence, bleeding, or neurologic sequela. The patient is tolerating medications without difficulties and is otherwise without complaint today.    Atrial Fibrillation Risk Factors:  she does not have symptoms or diagnosis of sleep apnea. she does not have a history of rheumatic fever. she does not have a history of alcohol use.   she has a BMI of Body mass index is 22.56 kg/m.[name] Filed Weights   01/03/21 0956  Weight: 54.2 kg    No family history on file.   Atrial Fibrillation Management history:  Previous antiarrhythmic drugs: amiodarone Previous cardioversions:  2013, 06/05/20 Previous ablations: none CHADS2VASC score: 3 Anticoagulation history: Eliquis   Past Medical History:  Diagnosis Date  . Aortic root dilatation ([place]) 05/11/12   CTA CHEST  . Arthritis   . 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 Dysrhythmia   . GERD (gastroesophageal reflux disease)   . H/O aortic valve disorder 10/04/2002   Dr. Tharon Aquas Trigt  . Headache(784.0)   . Heart murmur   . History of migraine headaches   . Hyperlipidemia    on statin therapy  . Migraines   . Thoracic ascending aortic aneurysm [facility])    CTA CHEST 05/11/12   Past Surgical History:  Procedure Laterality Date  . ABDOMINAL HYSTERECTOMY    . AORTIC VALVE REPLACEMENT  10/04/2002    Dr. Tharon Aquas Trigt    #21 pericardial stented valve  . CARDIOVERSION  07/15/2012   Procedure: CARDIOVERSION;  Surgeon: Pixie Casino, MD;  Location: [facility] ENDOSCOPY;  Service: Cardiovascular;  Laterality: N/A;  . CARDIOVERSION N/A 06/05/2020   Procedure: CARDIOVERSION;  Surgeon: Sueanne Margarita, MD;  Location: [facility] ENDOSCOPY;  Service: Cardiovascular;  Laterality: N/A;  . 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 LEFT AND RIGHT HEART CATHETERIZATION WITH CORONARY ANGIOGRAM N/A 06/18/2012   Procedure: LEFT AND RIGHT HEART CATHETERIZATION WITH CORONARY ANGIOGRAM;  Surgeon: Lorretta Harp, MD;  Location: [facility] CATH LAB;  Service: Cardiovascular;  Laterality: N/A;  . NM MYOCAR PERF WALL MOTION  10/29/2010   normal  . REPLACEMENT ASCENDING AORTA  07/07/2012   Procedure: REPLACEMENT ASCENDING AORTA;  Surgeon: Collier Salina  Prescott Gum, MD;  Location: [place];  Service: Open Heart Surgery;  Laterality: N/A;  CIRCULATORY ARREST  . TEE WITHOUT CARDIOVERSION  06/18/2012   Procedure: TRANSESOPHAGEAL ECHOCARDIOGRAM (TEE);  Surgeon: Sanda Klein, MD;  Location: [facility] ENDOSCOPY;  Service: Cardiovascular;  Laterality: N/A;  right and left heart cath after this procedure  . TEE WITHOUT  CARDIOVERSION  07/15/2012   Procedure: TRANSESOPHAGEAL ECHOCARDIOGRAM (TEE);  Surgeon: Pixie Casino, MD;  Location: [facility] ENDOSCOPY;  Service: Cardiovascular;  Laterality: N/A;  . TONSILLECTOMY      Current Outpatient Medications  Medication Sig Dispense Refill  . albuterol (VENTOLIN HFA) 108 (90 Base) MCG/ACT inhaler Inhale 2 puffs into the lungs every 6 (six) hours as needed for wheezing or shortness of breath.    [name] amiodarone (PACERONE) 200 MG tablet Take 1 tablet (200 mg total) by mouth daily. 45 tablet 0  . apixaban (ELIQUIS) 2.5 MG TABS tablet Take 1 tablet (2.5 mg total) by mouth 2 (two) times daily. 60 tablet 11  . azelastine (ASTELIN) 0.1 % nasal spray Place 2 sprays into both nostrils 2 (two) times daily.    . B Complex-C (B-COMPLEX WITH VITAMIN C) tablet Take 1 tablet by mouth daily.    . Calcium Carbonate-Vit D-Min (CALTRATE MINIS PLUS MINERALS PO) Take 1 tablet by mouth every evening.    . cholecalciferol (VITAMIN D) 25 MCG (1000 UNIT) tablet Take 1,000 Units by mouth every evening.    . metoprolol tartrate (LOPRESSOR) 25 MG tablet Take 1 tablet (25 mg total) by mouth 2 (two) times daily. 180 tablet 3  . Multiple Vitamins-Minerals (PRESERVISION AREDS 2+MULTI VIT) CAPS Take by mouth in the morning and at bedtime.    . pantoprazole (PROTONIX) 40 MG tablet TAKE ONE TABLET BY MOUTH ONCE DAILY BEFORE meal of choice 90 tablet 2  . pravastatin (PRAVACHOL) 10 MG tablet Take 10 mg by mouth every evening.    . SUMAtriptan (IMITREX) 100 MG tablet Take 100 mg by mouth every 2 (two) hours as needed for migraine. May repeat in 2 hours if headache persists or recurs.     No current facility-administered medications for this encounter.    Allergies  Allergen Reactions  . Other Nausea And Vomiting    Headache also  Also from artificial sweeetners  . Codeine Nausea And Vomiting    Social History   Socioeconomic History  . Marital status: Married    Spouse name: Not on file  . Number  of children: Not on file  . Years of education: Not on file  . Highest education level: Not on file  Occupational History  . Not on file  Tobacco Use  . Smoking status: Never Smoker  . Smokeless tobacco: Never Used  Substance and Sexual Activity  . Alcohol use: No  . Drug use: No  . Sexual activity: Not Currently  Other Topics Concern  . Not on file  Social History Narrative  . Not on file   Social Determinants of Health   Financial Resource Strain: Not on file  Food Insecurity: Not on file  Transportation Needs: Not on file  Physical Activity: Not on file  Stress: Not on file  Social Connections: Not on file  Intimate Partner Violence: Not on file     ROS- All systems are reviewed and negative except as per the HPI above.  Physical Exam: Vitals:   01/03/21 0956  BP: 110/72  Pulse: (!) 55  Weight: 54.2 kg  Height: 5\' 1"  (1.549 m)  GEN- The patient is a well appearing elderly female, alert and oriented x 3 today.   HEENT-head normocephalic, atraumatic, sclera clear, conjunctiva pink, hearing intact, trachea midline. Lungs- Clear to ausculation bilaterally, normal work of breathing Heart- Regular rate and rhythm, bradycardia, no murmurs, rubs or gallops  GI- soft, NT, ND, + BS Extremities- no clubbing, cyanosis, or edema MS- no significant deformity or atrophy Skin- no rash or lesion Psych- euthymic mood, full affect Neuro- strength and sensation are intact   Wt Readings from Last 3 Encounters:  01/03/21 54.2 kg  12/20/20 54 kg  11/01/20 55.5 kg    EKG today demonstrates  SB Vent. rate 55 BPM PR interval 194 ms QRS duration 90 ms QT/QTcB 468/447 ms  Echo 07/19/20 demonstrated  1. Left ventricular ejection fraction, by estimation, is 60 to 65%. The  left ventricle has normal function. The left ventricle has no regional  wall motion abnormalities. There is mild left ventricular hypertrophy.  Left ventricular diastolic parameters were normal.  2.  Right ventricular systolic function is normal. The right ventricular  size is normal.  3. The mitral valve is degenerative. Mild mitral valve regurgitation. No  evidence of mitral stenosis. Moderate mitral annular calcification.  4. History of stented bioprosthetic tissue valve resuspended during  aortic root graft repair Leaflets are thickened with overall moderate  appearing AS with gradients elevated also from severe central AR no PVL Gradients similar to TTE done June 2021 with preserved LV size and function . The aortic valve has been repaired/replaced. Aortic valve regurgitation is severe. Moderate aortic valve stenosis.  5. Post repair with 30 mm Hemi Shield Graft . Aortic root/ascending aorta has been repaired/replaced.  6. The inferior vena cava is normal in size with greater than 50%  respiratory variability, suggesting right atrial pressure of 3 mmHg.   Epic records are reviewed at length today  CHA2DS2-VASc Score = 3  The patient's score is based upon: CHF History: No HTN History: No Diabetes History: No Stroke History: No Vascular Disease History: No Age Score: 2 Gender Score: 1      ASSESSMENT AND PLAN: 1. Persistent Atrial Fibrillation/typical atrial flutter The patient's CHA2DS2-VASc score is 3, indicating a 3.2% annual risk of stroke.   Patient converted to SR with AAD. ? If PNA was trigger. Continue amiodarone 200 mg daily. Would recheck TSH/LFT on follow up. If she continues to maintain SR, could consider decreasing amio to 100 mg daily.  Continue Eliquis 2.5 mg BID (age, weight) Continue Lopressor 25 mg BID  2. Secondary Hypercoagulable State (ICD10:  D68.69) The patient is at significant risk for stroke/thromboembolism based upon her CHA2DS2-VASc Score of 3.  Continue Apixaban (Eliquis).   3. AS/Aortic root dilatation Bioprosthetic valve 2004. Mod AS/severe AI Followed by Dr [name].   Follow up with Dr [name] as scheduled. AF clinic in 6 months.     [facility] [address] [place], [place] [address] [phone] 01/03/2021 10:03 AM

## 2021-01-05 DIAGNOSIS — R1110 Vomiting, unspecified: Secondary | ICD-10-CM | POA: Diagnosis not present

## 2021-01-16 ENCOUNTER — Other Ambulatory Visit (HOSPITAL_COMMUNITY): Payer: Self-pay | Admitting: Physician Assistant

## 2021-01-16 ENCOUNTER — Telehealth: Payer: Self-pay | Admitting: Cardiovascular Disease

## 2021-01-16 DIAGNOSIS — Z888 Allergy status to other drugs, medicaments and biological substances status: Secondary | ICD-10-CM | POA: Diagnosis not present

## 2021-01-16 DIAGNOSIS — R0902 Hypoxemia: Secondary | ICD-10-CM | POA: Diagnosis present

## 2021-01-16 DIAGNOSIS — Z953 Presence of xenogenic heart valve: Secondary | ICD-10-CM | POA: Diagnosis not present

## 2021-01-16 DIAGNOSIS — Z7901 Long term (current) use of anticoagulants: Secondary | ICD-10-CM | POA: Diagnosis not present

## 2021-01-16 DIAGNOSIS — R0600 Dyspnea, unspecified: Secondary | ICD-10-CM | POA: Diagnosis not present

## 2021-01-16 DIAGNOSIS — I4892 Unspecified atrial flutter: Secondary | ICD-10-CM | POA: Diagnosis present

## 2021-01-16 DIAGNOSIS — Z79899 Other long term (current) drug therapy: Secondary | ICD-10-CM | POA: Diagnosis not present

## 2021-01-16 DIAGNOSIS — J9811 Atelectasis: Secondary | ICD-10-CM | POA: Diagnosis not present

## 2021-01-16 DIAGNOSIS — N189 Chronic kidney disease, unspecified: Secondary | ICD-10-CM | POA: Diagnosis present

## 2021-01-16 DIAGNOSIS — R0602 Shortness of breath: Secondary | ICD-10-CM | POA: Diagnosis not present

## 2021-01-16 DIAGNOSIS — N281 Cyst of kidney, acquired: Secondary | ICD-10-CM | POA: Diagnosis not present

## 2021-01-16 DIAGNOSIS — I129 Hypertensive chronic kidney disease with stage 1 through stage 4 chronic kidney disease, or unspecified chronic kidney disease: Secondary | ICD-10-CM | POA: Diagnosis present

## 2021-01-16 DIAGNOSIS — I130 Hypertensive heart and chronic kidney disease with heart failure and stage 1 through stage 4 chronic kidney disease, or unspecified chronic kidney disease: Secondary | ICD-10-CM | POA: Diagnosis not present

## 2021-01-16 DIAGNOSIS — Z8701 Personal history of pneumonia (recurrent): Secondary | ICD-10-CM | POA: Diagnosis not present

## 2021-01-16 DIAGNOSIS — R21 Rash and other nonspecific skin eruption: Secondary | ICD-10-CM | POA: Diagnosis not present

## 2021-01-16 DIAGNOSIS — I272 Pulmonary hypertension, unspecified: Secondary | ICD-10-CM | POA: Diagnosis present

## 2021-01-16 DIAGNOSIS — E039 Hypothyroidism, unspecified: Secondary | ICD-10-CM | POA: Diagnosis present

## 2021-01-16 DIAGNOSIS — D509 Iron deficiency anemia, unspecified: Secondary | ICD-10-CM | POA: Diagnosis present

## 2021-01-16 DIAGNOSIS — I517 Cardiomegaly: Secondary | ICD-10-CM | POA: Diagnosis not present

## 2021-01-16 DIAGNOSIS — Z885 Allergy status to narcotic agent status: Secondary | ICD-10-CM | POA: Diagnosis not present

## 2021-01-16 DIAGNOSIS — N179 Acute kidney failure, unspecified: Secondary | ICD-10-CM | POA: Diagnosis present

## 2021-01-16 DIAGNOSIS — I509 Heart failure, unspecified: Secondary | ICD-10-CM | POA: Diagnosis not present

## 2021-01-16 DIAGNOSIS — E785 Hyperlipidemia, unspecified: Secondary | ICD-10-CM | POA: Diagnosis present

## 2021-01-16 DIAGNOSIS — J90 Pleural effusion, not elsewhere classified: Secondary | ICD-10-CM | POA: Diagnosis not present

## 2021-01-16 NOTE — Telephone Encounter (Signed)
Received a call from patient she stated she is sob she can hardly walk from room to room without having to sit down.Stated sob started this past weekend and is worse this morning.Advised to go to ED.Stated she lives in [place] and will go to [facility] ED.

## 2021-01-16 NOTE — Telephone Encounter (Signed)
Pt c/o Shortness Of Breath: STAT if SOB developed within the last 24 hours or pt is noticeably SOB on the phone  1. Are you currently SOB (can you hear that pt is SOB on the phone)? Yes  2. How long have you been experiencing SOB? Worse on Saturday  3. Are you SOB when sitting or when up moving around? Moving around  4. Are you currently experiencing any other symptoms? No- wanted to be seen- I made an appt for Friday with [facility]

## 2021-01-17 DIAGNOSIS — N179 Acute kidney failure, unspecified: Secondary | ICD-10-CM | POA: Diagnosis not present

## 2021-01-17 DIAGNOSIS — N281 Cyst of kidney, acquired: Secondary | ICD-10-CM | POA: Diagnosis not present

## 2021-01-17 DIAGNOSIS — N189 Chronic kidney disease, unspecified: Secondary | ICD-10-CM | POA: Diagnosis not present

## 2021-01-19 ENCOUNTER — Ambulatory Visit: Payer: Medicare Other | Admitting: Physician Assistant

## 2021-01-24 DIAGNOSIS — N179 Acute kidney failure, unspecified: Secondary | ICD-10-CM | POA: Diagnosis not present

## 2021-01-24 DIAGNOSIS — I4891 Unspecified atrial fibrillation: Secondary | ICD-10-CM | POA: Diagnosis not present

## 2021-01-24 DIAGNOSIS — Z7689 Persons encountering health services in other specified circumstances: Secondary | ICD-10-CM | POA: Diagnosis not present

## 2021-01-24 DIAGNOSIS — I509 Heart failure, unspecified: Secondary | ICD-10-CM | POA: Diagnosis not present

## 2021-01-31 DIAGNOSIS — D649 Anemia, unspecified: Secondary | ICD-10-CM | POA: Diagnosis not present

## 2021-01-31 DIAGNOSIS — N19 Unspecified kidney failure: Secondary | ICD-10-CM | POA: Diagnosis not present

## 2021-01-31 DIAGNOSIS — Q8789 Other specified congenital malformation syndromes, not elsewhere classified: Secondary | ICD-10-CM | POA: Diagnosis not present

## 2021-01-31 DIAGNOSIS — E790 Hyperuricemia without signs of inflammatory arthritis and tophaceous disease: Secondary | ICD-10-CM | POA: Diagnosis not present

## 2021-01-31 DIAGNOSIS — R0609 Other forms of dyspnea: Secondary | ICD-10-CM | POA: Diagnosis not present

## 2021-02-07 DIAGNOSIS — R0609 Other forms of dyspnea: Secondary | ICD-10-CM | POA: Diagnosis not present

## 2021-02-07 DIAGNOSIS — N179 Acute kidney failure, unspecified: Secondary | ICD-10-CM | POA: Diagnosis not present

## 2021-02-07 DIAGNOSIS — N1832 Chronic kidney disease, stage 3b: Secondary | ICD-10-CM | POA: Diagnosis not present

## 2021-02-07 DIAGNOSIS — R579 Shock, unspecified: Secondary | ICD-10-CM | POA: Diagnosis not present

## 2021-02-07 DIAGNOSIS — I509 Heart failure, unspecified: Secondary | ICD-10-CM | POA: Diagnosis not present

## 2021-02-09 ENCOUNTER — Telehealth: Payer: Self-pay | Admitting: Cardiovascular Disease

## 2021-02-09 NOTE — Telephone Encounter (Signed)
Pt was seen by her PCP [name] on Wednesday  02/07/21 and was told that her BMP was 800, pt wants to know what she should do. Please advise pt further

## 2021-02-09 NOTE — Telephone Encounter (Signed)
Spoke to patient she stated she has been sob for the past 1 month.She saw PCP bnp elevated at 800.She wanted her to see Dr.Berry before August.Dr.Berry's schedule is full.Appointment scheduled with Laurann Montana PA 7/18 at 10:00 am at [facility] location.Directions given.Advised I will send message to scheduler to see if echo already scheduled 8/2 can be done before 7/18.

## 2021-02-18 NOTE — Progress Notes (Signed)
Office Visit    Patient Name: [name] [name] Date of Encounter: 02/18/2021  PCP:  [name], MD   [facility]  Cardiologist:  Quay Burow, MD  Advanced Practice Provider:  No care team member to display Electrophysiologist:  None    Chief Complaint    [name] [name] is a 80 y.o. 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 external pacemaker leads 07/19/2012   Dysrhythmia    GERD (gastroesophageal reflux disease)    H/O aortic valve disorder 10/04/2002   Dr. Tharon Aquas Trigt   Headache(784.0)    Heart murmur    History of migraine headaches    Hyperlipidemia    on statin therapy   Migraines    Thoracic ascending aortic aneurysm ([place])    CTA CHEST 05/11/12   Past Surgical History:  Procedure Laterality Date   ABDOMINAL HYSTERECTOMY     AORTIC VALVE REPLACEMENT  10/04/2002    Dr. 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   LEFT AND RIGHT HEART CATHETERIZATION WITH CORONARY ANGIOGRAM N/A 06/18/2012   Procedure: LEFT AND RIGHT HEART CATHETERIZATION WITH CORONARY ANGIOGRAM;  Surgeon: Lorretta Harp, MD;  Location: [facility] CATH LAB;  Service: Cardiovascular;  Laterality: N/A;   NM MYOCAR PERF WALL MOTION   10/29/2010   normal   REPLACEMENT ASCENDING AORTA  07/07/2012   Procedure: REPLACEMENT ASCENDING AORTA;  Surgeon: Ivin Poot, MD;  Location: [place];  Service: Open Heart Surgery;  Laterality: N/A;  CIRCULATORY ARREST   TEE WITHOUT CARDIOVERSION  06/18/2012   Procedure: TRANSESOPHAGEAL ECHOCARDIOGRAM (TEE);  Surgeon: Sanda Klein, MD;  Location: [facility] ENDOSCOPY;  Service: Cardiovascular;  Laterality: N/A;  right and left heart cath after this procedure   TEE WITHOUT CARDIOVERSION  07/15/2012   Procedure: TRANSESOPHAGEAL ECHOCARDIOGRAM (TEE);  Surgeon: Pixie Casino, MD;  Location: [facility] ENDOSCOPY;  Service: Cardiovascular;  Laterality: N/A;   TONSILLECTOMY      Allergies  Allergies  Allergen Reactions   Other Nausea And Vomiting    Headache also  Also from artificial sweeetners   Codeine Nausea And Vomiting    History of Present Illness    [name] [name] is a 80 y.o. 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  She was diagnosed with atrial fibrillation in 2004 in setting of her valve surgery. Previous cardiac catheterization 06/2012 with normal coronary arteries. Myoview stress test 11/2015 was low risk.   She did not have recurrent atrial fibrillation until October 2021. She was started on Eliquis for stroke prevention and underwent DCCV 06/05/20. Most recent echocardiogram  07/19/20 with LVEF 60-65%, no RWMA, mild LVH, normal diastolic parameters, normal RV size and function, mild MR, moderate mitral annular calcification, history of stented bioprosthetic tissue valve resuspended during aorticroot graft repair with moderate AS, severe AI.   She was seen by atrial fibrillation clinic 12/20/20 noted to be in atrial flutter. She was started on Amiodarone.   She contacted the office 614/22 noting progressive shortness  of breath and was recommended for evaluation in the emergency department.  Per her report she was seen at [facility] and diagnosed with congestive heart failure.  She contacted the office 02/09/21 noting shortness of breath. Her primary care had collected a BNP of 800 per her report.  She presents today for follow up. She tells me she used to walk 1-2 miles per day a couple months and now cannot walk from room to room without being short of breath.She was seen in the emergency department in [facility]. She was started on Torsemide 5 mg. She notes she has lost a few pounds and is "feeling less puffy". She has been walking up to 1/3 of a mile and is pleased with her progress.  She was started on Farxiga 10 mg daily for renal protection by her primary care provider.  She noted some swelling to the bottom of feet, but not to ankles. Tells me she does feel she is improving. She sees [facility] on Thursday.  She endorses eating out quite frequently as she lives alone but does try to eat a heart healthy diet.  She endorses drinking less than 2 L of fluid restriction per day.  Recent lab work: 02/07/21 with primary care: creatinine 2.0, GFR 24, K 4.3, Na 135,  01/31/21 Hb 9.5 oral supplement, creatinine 1.9, GFR 26,. 12/20/20: ALT 13, AST 20, TSH 2.414,  09/25/20 Hb 11.4, Hct 33, ProBNP 872  EKGs/Labs/Other Studies Reviewed:   The following studies were reviewed today:  Echo 07/19/20  1. Left ventricular ejection fraction, by estimation, is 60 to 65%. The  left ventricle has normal function. The left ventricle has no regional  wall motion abnormalities. There is mild left ventricular hypertrophy.  Left ventricular diastolic parameters  were normal.   2. Right ventricular systolic function is normal. The right ventricular  size is normal.   3. The mitral valve is degenerative. Mild mitral valve regurgitation. No  evidence of mitral stenosis. Moderate mitral annular calcification.   4.  History of stented bioprosthetic tissue valve resuspended during  aortic root graft repair Leaflets are thickened with overall moderate  appearing AS with gradients elevated also from severe central AR no PVL  Gradients similar to TTE done June 2021  with preserved LV size and function . The aortic valve has been  repaired/replaced. Aortic valve regurgitation is severe. Moderate aortic  valve stenosis.   5. Post repair with 30 mm Hemi Shield Graft . Aortic root/ascending aorta  has been repaired/replaced.   6. The inferior vena cava is normal in size with greater than 50%  respiratory variability, suggesting right atrial pressure of 3 mmHg.   EKG:  EKG is ordered today.  The ekg ordered today demonstrates SB 54 bpm with first degree AV block PR 228, voltage criteria for LVH, TWI in lead III.  Recent Labs: 05/22/2020: Hemoglobin 11.3; Platelets 224 12/20/2020: ALT 13; BUN 19; Creatinine, Ser 1.48; Potassium 4.9; Sodium 138; TSH 2.414  Recent Lipid Panel No results found for: CHOL, TRIG, HDL, CHOLHDL, VLDL, LDLCALC, LDLDIRECT  Home Medications  No outpatient medications have been marked as taking for the 02/19/21 encounter (Appointment) with Loel Dubonnet, NP.     Review of Systems      All other systems reviewed and are otherwise negative except as noted above.  Physical Exam    VS:  There were no vitals taken for this visit. , BMI There is no height or weight on file to calculate BMI.  Wt Readings from Last 3 Encounters:  01/03/21 119 lb 6.4 oz (54.2 kg)  12/20/20 119 lb (54 kg)  11/01/20 122 lb 6.4 oz (55.5 kg)     GEN: Well nourished, well developed, in no acute distress. HEENT: normal. Neck: Supple, no JVD, carotid bruits, or masses. Cardiac: RRR, no  rubs, or gallops. Gr 3/6 systolic murmurNo clubbing, cyanosis, edema.  Radials/PT 2+ and equal bilaterally.  Respiratory:  Respirations regular and unlabored, clear to auscultation bilaterally. GI: Soft, nontender,  nondistended. MS: No deformity or atrophy. Skin: Warm and dry, no rash. Neuro:  Strength and sensation are intact. Psych: Normal affect.  Assessment & Plan    Persistent atrial fibrillation / Atrial flutter / Chronic anticoagulation -EKG today shows she is maintaining sinus bradycardia.  Reports no recurrent palpitations.  Tolerating amiodarone 200 mg daily without signs of toxicity.  Continue Eliquis 2.5 mg twice daily due to CHA2DS2-VASc of at least 3 (gender, agex2).  On reduced dose due to age, body habitus.  Denies melena, hematuria. On Amiodarone therapy: 12/20/2020 TSH 2.414, ALT 13, AST 20. Recommend repeat LFTs, TSH with next lab work for monitoring.  Sinus bradycardia: Mildly bradycardic today but asymptomatic with no lightheadedness, dizziness, near-syncope, syncope.  She reports symptoms we may need to consider reducing her dose of amiodarone in the future.  Continue to monitor.  Aortic stenosis and aortic root dilation s/p AVR in 2004 and re suspension in 2013 / LE edema / Elevated BNP-echocardiogram 07/2020 LVEF 60 dL percent, no R WMA, mild LVH, normal diastolic parameters, mild MR, history of stented bioprosthetic tissue valve resuspended with overall moderate appearing AAS and severe AI.  She has upcoming echocardiogram 03/06/21 for assessment of LVEF, diastolic function, aortic valve. I anticipate her severe AI and moderate AS are the etiology of much of her volume overload. Continue Torsemide 5 mg QD, Farxiga 10mg  QD.  Renal function has been overall stable.  She sees nephrology this week.  CKD - Careful titration of diuretic and antihypertensive.  Continue Farxiga for renal protection.  Upcoming appointment this week with nephrology.  Anemia - 02/07/21 Hb 9.5.  Tells me she was started on oral supplementation.  Encourage dietary sources of iron.  Anticipate anemia is also contributory to fluid retention.  Continue to follow with primary care provider.  Consider referral to hematology  if unable to improve anemia.  She reports no melena, hematuria but we will need to monitor carefully in the setting of her anticoagulant use.  Disposition: Follow up  as scheduled 03/2021  with Dr. [name]  Signed, Loel Dubonnet, NP 02/18/2021, 4:31 PM [facility]

## 2021-02-19 ENCOUNTER — Other Ambulatory Visit: Payer: Self-pay

## 2021-02-19 ENCOUNTER — Ambulatory Visit (INDEPENDENT_AMBULATORY_CARE_PROVIDER_SITE_OTHER): Payer: Medicare Other | Admitting: Family

## 2021-02-19 ENCOUNTER — Encounter (HOSPITAL_BASED_OUTPATIENT_CLINIC_OR_DEPARTMENT_OTHER): Payer: Self-pay | Admitting: Family

## 2021-02-19 VITALS — BP 110/62 | HR 54 | Ht 61.0 in | Wt 116.4 lb

## 2021-02-19 DIAGNOSIS — Z8679 Personal history of other diseases of the circulatory system: Secondary | ICD-10-CM

## 2021-02-19 DIAGNOSIS — I712 Thoracic aortic aneurysm, without rupture: Secondary | ICD-10-CM

## 2021-02-19 DIAGNOSIS — D649 Anemia, unspecified: Secondary | ICD-10-CM | POA: Diagnosis not present

## 2021-02-19 DIAGNOSIS — I4819 Other persistent atrial fibrillation: Secondary | ICD-10-CM | POA: Diagnosis not present

## 2021-02-19 DIAGNOSIS — Z7901 Long term (current) use of anticoagulants: Secondary | ICD-10-CM | POA: Diagnosis not present

## 2021-02-19 DIAGNOSIS — I7121 Aneurysm of the ascending aorta, without rupture: Secondary | ICD-10-CM

## 2021-02-19 NOTE — Patient Instructions (Addendum)
Medication Instructions:  Continue your current medications.   *If you need a refill on your cardiac medications before your next appointment, please call your pharmacy*  Lab Work: None ordered today. Recommend having your thyroid and liver checked with your next lab work. This lets [place] check your thyroid and liver function after starting your Amiodarone.  If you have labs (blood work) drawn today and your tests are completely normal, you will receive your results only by: [place] (if you have MyChart) OR A paper copy in the mail If you have any lab test that is abnormal or we need to change your treatment, we will call you to review the results.   Testing/Procedures: Your echocardiogram is scheduled for 03/06/21.   Follow-Up: At [facility], you and your health needs are our priority.  As part of our continuing mission to provide you with exceptional heart care, we have created designated Provider Care Teams.  These Care Teams include your primary Cardiologist (physician) and Advanced Practice Providers (APPs -  Physician Assistants and Nurse Practitioners) who all work together to provide you with the care you need, when you need it.  We recommend signing up for the patient portal called "MyChart".  Sign up information is provided on this After Visit Summary.  MyChart is used to connect with patients for Virtual Visits (Telemedicine).  Patients are able to view lab/test results, encounter notes, upcoming appointments, etc.  Non-urgent messages can be sent to your provider as well.   To learn more about what you can do with MyChart, go to [email or web address].    Your next appointment:   As scheduled with Dr. [name]   Other Instructions  Recommend weighing daily and keeping a log. Please call our office if you have weight gain of 2 pounds overnight or 5 pounds in 1 week.   Date  Time Weight                                           Heart Healthy Diet  Recommendations: A low-salt diet is recommended. Meats should be grilled, baked, or boiled. Avoid fried foods. Focus on lean protein sources like fish or chicken with vegetables and fruits. The American Heart Association is a Microbiologist!  American Heart Association Diet and Lifeystyle Recommendations   Exercise recommendations: The American Heart Association recommends 150 minutes of moderate intensity exercise weekly. Try 30 minutes of moderate intensity exercise 4-5 times per week. This could include walking, jogging, or swimming.  Iron-Rich Diet  Iron is a mineral that helps your body produce hemoglobin. Hemoglobin is a protein in red blood cells that carries oxygen to your body's tissues. Eating too little iron may cause you to feel weak and tired, and it can increase your risk of infection. Iron is naturally found in many foods, and many foods have iron added to them (are iron-fortified). You may need to follow an iron-rich diet if you do not have enough iron in your body due to certain medical conditions. The amount of iron that you need each day depends on your age, your sex, and any medical conditions you have. Follow instructions from your health care provider or a dietitian about how much ironyou should eat each day. What are tips for following this plan? Reading food labels Check food labels to see how many milligrams (mg) of iron are in each serving. Cooking [employer]  foods in pots and pans that are made from iron. Take these steps to make it easier for your body to absorb iron from certain foods: Soak beans overnight before cooking. Soak whole grains overnight and drain them before using. Ferment flours before baking, such as by using yeast in bread dough. Meal planning When you eat foods that contain iron, you should eat them with foods that are high in vitamin C. These include oranges, peppers, tomatoes, potatoes, and mangoes. Vitamin C helps your body absorb iron. Certain foods  and drinks prevent your body from absorbing iron properly. Avoid eating these foods in the same meal as iron-rich foods or with iron supplements. These foods include: Coffee, black tea, and red wine. Milk, dairy products, and foods that are high in calcium. Beans and soybeans. Whole grains. General information Take iron supplements only as told by your health care provider. An overdose of iron can be life-threatening. If you were prescribed iron supplements, take them with orange juice or a vitamin C supplement. When you eat iron-fortified foods or take an iron supplement, you should also eat foods that naturally contain iron, such as meat, poultry, and fish. Eating naturally iron-rich foods helps your body absorb the iron that is added to other foods or contained in a supplement. Iron from animal sources is better absorbed than iron from plant sources. What foods should I eat? Fruits Prunes. Raisins. Eat fruits high in vitamin C, such as oranges, grapefruits, and strawberries,with iron-rich foods. Vegetables Spinach (cooked). Green peas. Broccoli. Fermented vegetables. Eat vegetables high in vitamin C, such as leafy greens, potatoes, bell peppers,and tomatoes, with iron-rich foods. Grains Iron-fortified breakfast cereal. Iron-fortified whole-wheat bread. Enrichedrice. Sprouted grains. Meats and other proteins Beef liver. Beef. [place]. Chicken. Oysters. Shrimp. [place]. Sardines. Chickpeas.Nuts. Tofu. Pumpkin seeds. Beverages Tomato juice. Fresh orange juice. Prune juice. Hibiscus tea. Iron-fortifiedinstant breakfast shakes. Sweets and desserts Blackstrap molasses. Seasonings and condiments Tahini. Fermented soy sauce. Other foods Wheat germ. The items listed above may not be a complete list of recommended foods and beverages. Contact a dietitian for more information. What foods should I limit? These are foods that should be limited while eating iron-rich foods as they canreduce the  absorption of iron in your body. Grains Whole grains. Bran cereal. Bran flour. Meats and other proteins Soybeans. Products made from soy protein. Black beans. Lentils. Mung beans.Split peas. Dairy Milk. Cream. Cheese. Yogurt. Cottage cheese. Beverages Coffee. Black tea. Red wine. Sweets and desserts Cocoa. Chocolate. Ice cream. Seasonings and condiments Basil. Oregano. Large amounts of parsley. The items listed above may not be a complete list of foods and beverages you should limit. Contact a dietitian for more information. Summary Iron is a mineral that helps your body produce hemoglobin. Hemoglobin is a protein in red blood cells that carries oxygen to your body's tissues. Iron is naturally found in many foods, and many foods have iron added to them (are iron-fortified). When you eat foods that contain iron, you should eat them with foods that are high in vitamin C. Vitamin C helps your body absorb iron. Certain foods and drinks prevent your body from absorbing iron properly, such as whole grains and dairy products. You should avoid eating these foods in the same meal as iron-rich foods or with iron supplements. This information is not intended to replace advice given to you by your health care provider. Make sure you discuss any questions you have with your healthcare provider. Document Revised: 07/03/2020 Document Reviewed: 07/03/2020 Elsevier Patient Education  [place].

## 2021-02-23 DIAGNOSIS — B354 Tinea corporis: Secondary | ICD-10-CM | POA: Diagnosis not present

## 2021-02-23 DIAGNOSIS — R233 Spontaneous ecchymoses: Secondary | ICD-10-CM | POA: Diagnosis not present

## 2021-02-23 DIAGNOSIS — L578 Other skin changes due to chronic exposure to nonionizing radiation: Secondary | ICD-10-CM | POA: Diagnosis not present

## 2021-02-26 ENCOUNTER — Ambulatory Visit (INDEPENDENT_AMBULATORY_CARE_PROVIDER_SITE_OTHER): Payer: Medicare Other | Admitting: Allergy and Immunology

## 2021-02-26 ENCOUNTER — Encounter: Payer: Self-pay | Admitting: Allergy and Immunology

## 2021-02-26 ENCOUNTER — Other Ambulatory Visit: Payer: Self-pay

## 2021-02-26 VITALS — BP 110/60 | HR 55 | Resp 16 | Ht 61.0 in | Wt 117.0 lb

## 2021-02-26 DIAGNOSIS — K219 Gastro-esophageal reflux disease without esophagitis: Secondary | ICD-10-CM

## 2021-02-26 DIAGNOSIS — J3089 Other allergic rhinitis: Secondary | ICD-10-CM | POA: Diagnosis not present

## 2021-02-26 DIAGNOSIS — J310 Chronic rhinitis: Secondary | ICD-10-CM | POA: Diagnosis not present

## 2021-02-26 MED ORDER — IPRATROPIUM BROMIDE 0.06 % NA SOLN: NASAL | 5 refills | Status: DC

## 2021-02-26 NOTE — Patient Instructions (Addendum)
  1.  Allergen avoidance measures - dust mite, grass pollen  2.  Ipratropium 0.06% -2 sprays each nostril every 6 hours if needed.  3.  Treat and prevent reflux/LPR:  A.  Continue pantoprazole 40 mg -1 tablet 1 time per day B.  Consolidate all forms of caffeine consumption slowly  4.  Return to clinic in 4 weeks or earlier if problem

## 2021-02-26 NOTE — Progress Notes (Signed)
[place] - [place] - [place] - [place] - [place]   Dear Dr. Jeryl Columbia,  Thank you for referring Carleta F Paluch to the [facility] on 02/26/2021.   Below is a summation of this patient's evaluation and recommendations.  Thank you for your referral. I will keep you informed about this patient's response to treatment.   If you have any questions please do not hesitate to contact me.   Sincerely,  Jiles Prows, MD Allergy / Immunology [place]   ______________________________________________________________________    NEW PATIENT NOTE  Referring Provider: [name], MD Primary Provider: [name], MD Date of office visit: 02/26/2021    Subjective:   Chief Complaint:  [name] [name] (DOB: [birth year]) is a 80 y.o. female who presents to the clinic on 02/26/2021 with a chief complaint of Allergic Rhinitis  .     HPI: Marlisa presents to this clinic in evaluation of 2 main issues.  First, she has rather significant rhinorrhea that starts when she wakes up in the morning and is especially prevalent when she eats.  She must eat with the tissue in her hand.  She has a little bit of sneezing as well but not really much nasal congestion.  She does not have any associated inability to smell or taste and no ugly nasal discharge.  The material emanating from her nose comes from both nostrils.  No treatment that has been administered in the past has helped including the use of nasal steroids.  She does not really note an obvious provoking factor giving rise to this issue.  Second, she has constant drainage in her throat and cannot clear out her throat and has throat clearing and intermittent raspy voice.  This has been a longstanding issue.  She does have a history of reflux and is on pantoprazole for many years.  Even while using pantoprazole she does have a regurgitation  with sour material about 1 time per week.  She drinks approximately 5 teas per day and has Dr Malachi Bonds about 3 times per week.  As well, she has noticed a rather significant increase in her dyspnea on exertion over the course of the past month or so.  Whereas she was walking 2 miles without much difficulty she cannot walk a third of a mile without getting breathless.  She has had replacement of her aortic valve in the past and she has a echocardiogram scheduled for the beginning of August to assess her valvular gradient.  She has received 3 Moderna COVID vaccines.  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 external pacemaker leads 07/19/2012   Dysrhythmia    GERD (gastroesophageal reflux disease)    H/O aortic valve disorder 10/04/2002   Dr. Tharon Aquas Trigt   Headache(784.0)    Heart murmur    History of migraine headaches    Hyperlipidemia    on statin therapy   Migraines    Thoracic ascending aortic aneurysm [facility])    CTA CHEST 05/11/12    Past Surgical History:  Procedure Laterality Date   ABDOMINAL HYSTERECTOMY     AORTIC VALVE REPLACEMENT  10/04/2002    Dr. 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   LEFT AND RIGHT HEART CATHETERIZATION WITH CORONARY ANGIOGRAM N/A 06/18/2012   Procedure: LEFT AND RIGHT HEART CATHETERIZATION WITH CORONARY ANGIOGRAM;  Surgeon: Lorretta Harp, MD;  Location: [facility] CATH LAB;  Service: Cardiovascular;  Laterality: N/A;   NM MYOCAR PERF WALL MOTION  10/29/2010   normal    REPLACEMENT ASCENDING AORTA  07/07/2012   Procedure: REPLACEMENT ASCENDING AORTA;  Surgeon: Ivin Poot, MD;  Location: [facility];  Service: Open Heart Surgery;  Laterality: N/A;  CIRCULATORY ARREST   TEE WITHOUT CARDIOVERSION  06/18/2012   Procedure: TRANSESOPHAGEAL ECHOCARDIOGRAM (TEE);  Surgeon: Sanda Klein, MD;  Location: [facility] ENDOSCOPY;  Service: Cardiovascular;  Laterality: N/A;  right and left heart cath after this procedure   TEE WITHOUT CARDIOVERSION  07/15/2012   Procedure: TRANSESOPHAGEAL ECHOCARDIOGRAM (TEE);  Surgeon: Pixie Casino, MD;  Location: [facility] ENDOSCOPY;  Service: Cardiovascular;  Laterality: N/A;   TONSILLECTOMY      Allergies as of 02/26/2021       Reactions   Other Nausea And Vomiting   Headache also  Also from artificial sweeetners   Codeine Nausea And Vomiting        Medication List    amiodarone 200 MG tablet Commonly known as: PACERONE Take 1 tablet (200 mg total) by mouth daily.   apixaban 2.5 MG Tabs tablet Commonly known as: ELIQUIS Take 1 tablet (2.5 mg total) by mouth 2 (two) times daily.   azelastine 0.1 % nasal spray Commonly known as: ASTELIN Place 2 sprays into both nostrils 2 (two) times daily.   B-complex with vitamin C tablet Take 1 tablet by mouth daily.   CALTRATE MINIS PLUS MINERALS PO Take 1 tablet by mouth every evening.   cholecalciferol 25 MCG (1000 UNIT) tablet Commonly known as: VITAMIN D Take 1,000 Units by mouth every evening.   Farxiga 10 MG Tabs tablet Generic drug: dapagliflozin propanediol Take 10 mg by mouth daily.   Hemocyte Plus 106-1 MG Caps Take 1 capsule by mouth daily.   metoprolol tartrate 25 MG tablet Commonly known as: LOPRESSOR Take 1 tablet (25 mg total) by mouth 2 (two) times daily.   pantoprazole 40 MG tablet Commonly known as: PROTONIX TAKE ONE TABLET BY MOUTH ONCE DAILY BEFORE meal of choice   pravastatin 10 MG tablet Commonly known as: PRAVACHOL Take 10 mg by mouth every evening.    PreserVision AREDS 2+Multi Vit Caps Take by mouth in the morning and at bedtime.   SUMAtriptan 100 MG tablet Commonly known as: IMITREX Take 100 mg by mouth every 2 (two) hours as needed for migraine. May repeat in 2 hours if headache persists or recurs.   torsemide 5 MG tablet Commonly known as: DEMADEX Take 5 mg by mouth every morning.        Review of systems negative except as noted in HPI / PMHx or noted below:  Review of Systems  Constitutional: Negative.   HENT: Negative.    Eyes: Negative.   Respiratory: Negative.    Cardiovascular: Negative.   Gastrointestinal: Negative.   Genitourinary: Negative.   Musculoskeletal: Negative.   Skin: Negative.   Neurological: Negative.   Endo/Heme/Allergies: Negative.   Psychiatric/Behavioral: Negative.     History  reviewed. No pertinent family history.  Social History   Socioeconomic History   Marital status: Married    Spouse name: Not on file   Number of children: Not on file   Years of education: Not on file   Highest education level: Not on file  Occupational History   Not on file  Tobacco Use   Smoking status: Never   Smokeless tobacco: Never  Substance and Sexual Activity   Alcohol use: No   Drug use: No   Sexual activity: Not Currently  Other Topics Concern   Not on file  Social History Narrative   Not on file   Environmental and Social history  Lives in a townhouse with a dry environment, no animals look inside the household, no carpet in the bedroom, plastic on the bed, plastic on the pillow, and no smoking ongoing with inside the household.  Objective:   Vitals:   02/26/21 1439  BP: 110/60  Pulse: (!) 55  Resp: 16  SpO2: 96%   Height: 5\' 1"  (154.9 cm) Weight: 117 lb (53.1 kg)  Physical Exam Constitutional:      Appearance: She is not diaphoretic.  HENT:     Head: Normocephalic.     Right Ear: Tympanic membrane, ear canal and external ear normal.     Left Ear: Tympanic membrane, ear  canal and external ear normal.     Nose: Nose normal. No mucosal edema or rhinorrhea.     Mouth/Throat:     Pharynx: Uvula midline. No oropharyngeal exudate.  Eyes:     Conjunctiva/sclera: Conjunctivae normal.  Neck:     Thyroid: No thyromegaly.     Trachea: Trachea normal. No tracheal tenderness or tracheal deviation.  Cardiovascular:     Rate and Rhythm: Normal rate and regular rhythm.     Heart sounds: S1 normal and S2 normal. Murmur (Prolong systolic) heard.  Pulmonary:     Effort: No respiratory distress.     Breath sounds: Normal breath sounds. No stridor. No wheezing or rales.  Lymphadenopathy:     Head:     Right side of head: No tonsillar adenopathy.     Left side of head: No tonsillar adenopathy.     Cervical: No cervical adenopathy.  Skin:    Findings: No erythema or rash.     Nails: There is no clubbing.  Neurological:     Mental Status: She is alert.    Diagnostics: Allergy skin tests were performed.  She demonstrated hypersensitivity to grasses and dust mite.  Results of blood tests obtained 22 May 2021 identifies WBC 4.3, hemoglobin 11.3, platelet 224.  Results of blood tests obtained 20 Dec 2020 identifies creatinine 1.48 mg/DL, AST 20 U/L, AST 13 U/L  Results of an echocardiogram obtained 19 July 2020 identified the following:      1. Left ventricular ejection fraction, by estimation, is 60 to 65%. The  left ventricle has normal function. The left ventricle has no regional  wall motion abnormalities. There is mild left ventricular hypertrophy.  Left ventricular diastolic parameters  were normal.   2. Right ventricular systolic function is normal. The right ventricular  size is normal.   3. The mitral valve is degenerative. Mild mitral valve regurgitation. No  evidence of mitral stenosis. Moderate mitral annular calcification.   4. History of stented bioprosthetic tissue valve resuspended during  aortic root graft repair Leaflets are thickened  with overall moderate  appearing AS with gradients elevated also from severe central AR no  PVL  Gradients similar to TTE done June 2021  with preserved LV size and function . The aortic valve has been  repaired/replaced. Aortic valve regurgitation is severe. Moderate aortic  valve stenosis.   5. Post repair with 30 mm Hemi Shield Graft . Aortic root/ascending aorta  has been repaired/replaced.   6. The inferior vena cava is normal in size with greater than 50%  respiratory variability, suggesting right atrial pressure of 3 mmHg.    Results of a chest x-ray obtained 15 September 2020 identifies lungs without any acute infiltrate, edema, mass, or effusion.  Poststernotomy changes with valvular prosthesis.  Hyperinflation.  Assessment and Plan:    1. Perennial allergic rhinitis   2. Gustatory rhinitis   3. LPRD (laryngopharyngeal reflux disease)     1.  Allergen avoidance measures - dust mite, grass pollen  2.  Ipratropium 0.06% -2 sprays each nostril every 6 hours if needed.  3.  Treat and prevent reflux/LPR:  A.  Continue pantoprazole 40 mg -1 tablet 1 time per day B.  Consolidate all forms of caffeine consumption slowly  4.  Return to clinic in 4 weeks or earlier if problem  Mckaila appears to have a combination of atopic respiratory disease affecting her upper airway and gustatory rhinitis for which we will get her to perform allergen avoidance measures and use nasal ipratropium.  And she has a history very consistent with laryngopharyngeal reflux for which we will treat her by having her focus on consolidation of her caffeine consumption.  I will see her back in this clinic in 4 weeks to assess her response to this approach.  Jiles Prows, MD Allergy / Immunology [facility]

## 2021-02-27 ENCOUNTER — Encounter: Payer: Self-pay | Admitting: Allergy and Immunology

## 2021-03-05 ENCOUNTER — Telehealth (HOSPITAL_COMMUNITY): Payer: Self-pay

## 2021-03-05 DIAGNOSIS — D631 Anemia in chronic kidney disease: Secondary | ICD-10-CM | POA: Diagnosis not present

## 2021-03-05 DIAGNOSIS — N189 Chronic kidney disease, unspecified: Secondary | ICD-10-CM | POA: Diagnosis not present

## 2021-03-05 DIAGNOSIS — I129 Hypertensive chronic kidney disease with stage 1 through stage 4 chronic kidney disease, or unspecified chronic kidney disease: Secondary | ICD-10-CM | POA: Diagnosis not present

## 2021-03-05 DIAGNOSIS — I351 Nonrheumatic aortic (valve) insufficiency: Secondary | ICD-10-CM | POA: Diagnosis not present

## 2021-03-05 DIAGNOSIS — N184 Chronic kidney disease, stage 4 (severe): Secondary | ICD-10-CM | POA: Diagnosis not present

## 2021-03-05 DIAGNOSIS — E559 Vitamin D deficiency, unspecified: Secondary | ICD-10-CM | POA: Diagnosis not present

## 2021-03-05 MED ORDER — AMIODARONE HCL 200 MG PO TABS: ORAL_TABLET | ORAL | Status: DC

## 2021-03-05 NOTE — Telephone Encounter (Signed)
Patient called and states she is having dizziness, feeling tired and nauseated. Per Adline Peals PA-Instructed patient to decrease  Amiodarone from 200mg  to 100mg  - taking one tablet daily only on Monday, Wednesday, Friday and Saturday. Consulted with patient and she verbalized understanding.

## 2021-03-06 ENCOUNTER — Other Ambulatory Visit: Payer: Self-pay

## 2021-03-06 ENCOUNTER — Ambulatory Visit (HOSPITAL_COMMUNITY): Payer: Medicare Other | Attending: Cardiology

## 2021-03-06 DIAGNOSIS — Z952 Presence of prosthetic heart valve: Secondary | ICD-10-CM | POA: Diagnosis not present

## 2021-03-06 DIAGNOSIS — Z8679 Personal history of other diseases of the circulatory system: Secondary | ICD-10-CM

## 2021-03-06 DIAGNOSIS — I712 Thoracic aortic aneurysm, without rupture: Secondary | ICD-10-CM | POA: Diagnosis not present

## 2021-03-06 DIAGNOSIS — I7121 Aneurysm of the ascending aorta, without rupture: Secondary | ICD-10-CM

## 2021-03-06 LAB — ECHOCARDIOGRAM COMPLETE
AR max vel: 0.87 cm2
AV Area VTI: 0.9 cm2
AV Area mean vel: 0.91 cm2
AV Mean grad: 26.6 mmHg
AV Peak grad: 49.1 mmHg
Ao pk vel: 3.5 m/s
Area-P 1/2: 4.32 cm2
P 1/2 time: 390 msec
S' Lateral: 3.3 cm

## 2021-03-14 ENCOUNTER — Encounter: Payer: Self-pay | Admitting: *Deleted

## 2021-03-16 ENCOUNTER — Ambulatory Visit (INDEPENDENT_AMBULATORY_CARE_PROVIDER_SITE_OTHER): Payer: Medicare Other | Admitting: Cardiovascular Disease

## 2021-03-16 ENCOUNTER — Other Ambulatory Visit: Payer: Self-pay

## 2021-03-16 ENCOUNTER — Encounter: Payer: Self-pay | Admitting: Cardiovascular Disease

## 2021-03-16 VITALS — BP 128/64 | HR 51 | Ht 61.0 in | Wt 117.6 lb

## 2021-03-16 DIAGNOSIS — E782 Mixed hyperlipidemia: Secondary | ICD-10-CM

## 2021-03-16 DIAGNOSIS — I480 Paroxysmal atrial fibrillation: Secondary | ICD-10-CM | POA: Diagnosis not present

## 2021-03-16 DIAGNOSIS — Z8679 Personal history of other diseases of the circulatory system: Secondary | ICD-10-CM

## 2021-03-16 DIAGNOSIS — R0989 Other specified symptoms and signs involving the circulatory and respiratory systems: Secondary | ICD-10-CM

## 2021-03-16 NOTE — Progress Notes (Signed)
[birth year] [name] [name]   [birth year]  [id number]  Primary Physician [name], MD Primary Cardiologist: Lorretta Harp MD Lupe Carney, [place]  HPI:  [name] [name] is a 80 y.o.  thin-appearing, married  female, mother of 63, grandmother to 1 grandchild who I last saw in the office 09/27/2020..  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 Her prosthetic valve which was placed 10 years ago (January 2004) was resuspended. I had cath'd her June 18, 2012, revealing normal coronary arteries, normal LV function and normal right heart pressures. Her postop course was uncomplicated except for some minor A-fib/flutter which converted to sinus rhythm prior to discharge. She recovered nicely from her descending thoracic aortic aneurysm resection and grafting. I last saw her 11/24/15.Marland KitchenShe had 2 episodes of worrisome chest pain prior to that visit which were nitrate responsive. The pain radiated  into her neck shoulders and back as well. A Myoview stress test performed 12/01/15 was low risk and not ischemic. A 2-D echo showed normal LV size and function, mild LVH with moderate aortic insufficiency. She's had no recurrent chest pain. She was diagnosed with gastric ulcers thought to be related to her low dose aspirin which has been discontinued.   Her husband of 22 years unfortunately died 2018-08-15 after a prolonged hospitalization and recovery from stroke and heart attack.  She currently lives alone.  She gets occasional atypical chest pain.  She denies shortness of breath.  2D echo performed 12/16/2018 revealed normal LV systolic function with moderate aortic insufficiency and increase in her mean aortic valve gradient from 13 to 21 mmHg.  The ascending graft was visualized as well.   She was seen in the A. fib clinic by Malka So, placed on Eliquis and underwent successful outpatient DC  cardioversion by Dr. Radford Pax on 06/05/2020. Her last EKG performed 06/12/2020 revealed sinus rhythm.  She has since been started on amiodarone and is maintaining sinus rhythm.  She saw Laurann Montana in the office 02/19/2021 with increasing shortness of breath.  She is on torsemide.  Recent 2D echo performed 03/06/2021 revealed normal LV systolic function, grade 2 diastolic dysfunction with moderate AAS and moderate or greater AI.  I believe that her symptoms are related to her prosthetic valve malfunction.  Current Meds  Medication Sig   amiodarone (PACERONE) 200 MG tablet Take 1/2 tablet by mouth daily only on Monday, Wednesday, Friday and Saturday   apixaban (ELIQUIS) 2.5 MG TABS tablet Take 1 tablet (2.5 mg total) by mouth 2 (two) times daily.   azelastine (ASTELIN) 0.1 % nasal spray Place 2 sprays into both nostrils 2 (two) times daily.   B Complex-C (B-COMPLEX WITH VITAMIN C) tablet Take 1 tablet by mouth daily.   Calcium Carbonate-Vit D-Min (CALTRATE MINIS PLUS MINERALS PO) Take 1 tablet by mouth every evening.   cholecalciferol (VITAMIN D) 25 MCG (1000 UNIT) tablet Take 1,000 Units by mouth every evening.   FARXIGA 10 MG TABS tablet Take 10 mg by mouth daily.   Fe Fum-FA-B Cmp-C-Zn-Mg-Mn-Cu (HEMOCYTE PLUS) 106-1 MG CAPS Take 1 capsule by mouth daily.   ipratropium (ATROVENT) 0.06 % nasal spray 2 sprays in each nostril every 6 hours if needed   metoprolol tartrate (LOPRESSOR) 25 MG tablet Take 1 tablet (25 mg total) by mouth 2 (two) times daily.   Multiple Vitamins-Minerals (PRESERVISION AREDS 2+MULTI VIT) CAPS Take by mouth in the morning and at bedtime.  pantoprazole (PROTONIX) 40 MG tablet TAKE ONE TABLET BY MOUTH ONCE DAILY BEFORE meal of choice   pravastatin (PRAVACHOL) 10 MG tablet Take 10 mg by mouth every evening.   SUMAtriptan (IMITREX) 100 MG tablet Take 100 mg by mouth every 2 (two) hours as needed for migraine. May repeat in 2 hours if headache persists or recurs.   torsemide  (DEMADEX) 5 MG tablet Take 5 mg by mouth every morning.     Allergies  Allergen Reactions   Other Nausea And Vomiting    Headache also  Also from artificial sweeetners   Codeine Nausea And Vomiting    Social History   Socioeconomic History   Marital status: Married    Spouse name: Not on file   Number of children: Not on file   Years of education: Not on file   Highest education level: Not on file  Occupational History   Not on file  Tobacco Use   Smoking status: Never   Smokeless tobacco: Never  Substance and Sexual Activity   Alcohol use: No   Drug use: No   Sexual activity: Not Currently  Other Topics Concern   Not on file  Social History Narrative   Not on file   Social Determinants of Health   Financial Resource Strain: Not on file  Food Insecurity: Not on file  Transportation Needs: Not on file  Physical Activity: Not on file  Stress: Not on file  Social Connections: Not on file  Intimate Partner Violence: Not on file     Review of Systems: General: negative for chills, fever, night sweats or weight changes.  Cardiovascular: negative for chest pain, dyspnea on exertion, edema, orthopnea, palpitations, paroxysmal nocturnal dyspnea or shortness of breath Dermatological: negative for rash Respiratory: negative for cough or wheezing Urologic: negative for hematuria Abdominal: negative for nausea, vomiting, diarrhea, bright red blood per rectum, melena, or hematemesis Neurologic: negative for visual changes, syncope, or dizziness All other systems reviewed and are otherwise negative except as noted above.    Blood pressure 128/64, pulse (!) 51, height 5\' 1"  (1.549 m), weight 117 lb 9.6 oz (53.3 kg), SpO2 91 %.  General appearance: alert and no distress Neck: no adenopathy, no JVD, supple, symmetrical, trachea midline, thyroid not enlarged, symmetric, no tenderness/mass/nodules, and bilateral carotid bruits Lungs: clear to auscultation bilaterally Heart:  2/6 systolic ejection murmur at the base consistent with aortic stenosis and 2/6 diastolic decrescendo murmur consistent with aortic insufficiency. Extremities: extremities normal, atraumatic, no cyanosis or edema Pulses: 2+ and symmetric Skin: Skin color, texture, turgor normal. No rashes or lesions Neurologic: Grossly normal  EKG sinus bradycardia 51 without ST or T wave changes.  Personally reviewed this EKG.  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  She has had progressive shortness of breath with recent 2D echocardiogram read by Dr. 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  A transesophageal echo was recommended which we will order.  Paroxysmal atrial fibrillation (HCC) History of paroxysmal A. fib maintaining sinus rhythm after multiple cardioversions on amiodarone.  She is on Eliquis 2.5 mg twice daily.  Hyperlipidemia History of hyperlipidemia on statin therapy with lipid profile performed 03/03/2020 revealing a total cholesterol of Riley MD [facility] 03/16/2021 11:32 AM

## 2021-03-16 NOTE — Assessment & Plan Note (Signed)
History of paroxysmal A. fib maintaining sinus rhythm after multiple cardioversions on amiodarone.  She is on Eliquis 2.5 mg twice daily.

## 2021-03-16 NOTE — Assessment & Plan Note (Signed)
History of aortic valve replacement back in 2004 with resuspension of her aortic valve, thoracic aortic aneurysm resection and grafting.  She has had progressive shortness of breath with recent 2D echocardiogram read by Dr. 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  A transesophageal echo was recommended which we will order.

## 2021-03-16 NOTE — Patient Instructions (Signed)
Medication Instructions:  Your physician recommends that you continue on your current medications as directed. Please refer to the Current Medication list given to you today.  *If you need a refill on your cardiac medications before your next appointment, please call your pharmacy*   Lab Work: BMET, CBC today  If you have labs (blood work) drawn today and your tests are completely normal, you will receive your results only by: [place] (if you have MyChart) OR A paper copy in the mail If you have any lab test that is abnormal or we need to change your treatment, we will call you to review the results.   Testing/Procedures: Your physician has requested that you have a TEE. During a TEE, sound waves are used to create images of your heart. It provides your doctor with information about the size and shape of your heart and how well your heart's chambers and valves are working. In this test, a transducer is attached to the end of a flexible tube that's guided down your throat and into your esophagus (the tube leading from you mouth to your stomach) to get a more detailed image of your heart. You are not awake for the procedure. Please see the instruction sheet given to you today. For further information please visit [email or web address].  Your physician has requested that you have a carotid duplex. This test is an ultrasound of the carotid arteries in your neck. It looks at blood flow through these arteries that supply the brain with blood. Allow one hour for this exam. There are no restrictions or special instructions.  Follow-Up: At [facility], you and your health needs are our priority.  As part of our continuing mission to provide you with exceptional heart care, we have created designated Provider Care Teams.  These Care Teams include your primary Cardiologist (physician) and Advanced Practice Providers (APPs -  Physician Assistants and Nurse Practitioners) who all work together to  provide you with the care you need, when you need it.  We recommend signing up for the patient portal called "MyChart".  Sign up information is provided on this After Visit Summary.  MyChart is used to connect with patients for Virtual Visits (Telemedicine).  Patients are able to view lab/test results, encounter notes, upcoming appointments, etc.  Non-urgent messages can be sent to your provider as well.   To learn more about what you can do with MyChart, go to [email or web address].    Your next appointment:   3 month(s)  The format for your next appointment:   In Person  Provider:   Quay Burow, MD   Other Instructions You have been referred to: Heart Failure clinic and structural heart/valve clinic (ASAP)   You are scheduled for a TEE on Tuesday 8/23 with Dr. Sallyanne Kuster.  Please arrive at the [facility] (Main Entrance A) at [facility]: [address] [place], [place] [address] at 10 AM (1 hour prior to procedure unless lab work is needed; if lab work is needed arrive 1.5 hours ahead)  DIET: Nothing to eat or drink after midnight except a sip of water with medications (see medication instructions below)  FYI: For your safety, and to allow [place] to monitor your vital signs accurately during the surgery/procedure we request that   if you have artificial nails, gel coating, SNS etc. Please have those removed prior to your surgery/procedure. Not having the nail coverings /polish removed may result in cancellation or delay of your surgery/procedure.   Medication Instructions: Hold  torsemide AM of procedure  Continue your anticoagulant: Eliquis You will need to continue your anticoagulant after your procedure until you  are told by your  Provider that it is safe to stop   Labs: today in office  You must have a responsible person to drive you home and stay in the waiting area during your procedure. Failure to do so could result in cancellation.  Bring your insurance  cards.  *Special Note: Every effort is made to have your procedure done on time. Occasionally there are emergencies that occur at the hospital that may cause delays. Please be patient if a delay does occur.

## 2021-03-16 NOTE — H&P (View-Only) (Signed)
3/55/7322 [name] [name]   [birth year]  [id number]  Primary Physician [name], MD Primary Cardiologist: Lorretta Harp MD Lupe Carney, [place]  HPI:  [name] [name] is a 80 y.o.  thin-appearing, married  female, mother of 15, grandmother to 1 grandchild who I last saw in the office 09/27/2020..  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 Her prosthetic valve which was placed 10 years ago (January 2004) was resuspended. I had cath'd her June 18, 2012, revealing normal coronary arteries, normal LV function and normal right heart pressures. Her postop course was uncomplicated except for some minor A-fib/flutter which converted to sinus rhythm prior to discharge. She recovered nicely from her descending thoracic aortic aneurysm resection and grafting. I last saw her 11/24/15.Marland KitchenShe had 2 episodes of worrisome chest pain prior to that visit which were nitrate responsive. The pain radiated  into her neck shoulders and back as well. A Myoview stress test performed 12/01/15 was low risk and not ischemic. A 2-D echo showed normal LV size and function, mild LVH with moderate aortic insufficiency. She's had no recurrent chest pain. She was diagnosed with gastric ulcers thought to be related to her low dose aspirin which has been discontinued.   Her husband of 64 years unfortunately died 2018/08/19 after a prolonged hospitalization and recovery from stroke and heart attack.  She currently lives alone.  She gets occasional atypical chest pain.  She denies shortness of breath.  2D echo performed 12/16/2018 revealed normal LV systolic function with moderate aortic insufficiency and increase in her mean aortic valve gradient from 13 to 21 mmHg.  The ascending graft was visualized as well.   She was seen in the A. fib clinic by Malka So, placed on Eliquis and underwent successful outpatient DC  cardioversion by Dr. Radford Pax on 06/05/2020. Her last EKG performed 06/12/2020 revealed sinus rhythm.  She has since been started on amiodarone and is maintaining sinus rhythm.  She saw Laurann Montana in the office 02/19/2021 with increasing shortness of breath.  She is on torsemide.  Recent 2D echo performed 03/06/2021 revealed normal LV systolic function, grade 2 diastolic dysfunction with moderate AAS and moderate or greater AI.  I believe that her symptoms are related to her prosthetic valve malfunction.  Current Meds  Medication Sig   amiodarone (PACERONE) 200 MG tablet Take 1/2 tablet by mouth daily only on Monday, Wednesday, Friday and Saturday   apixaban (ELIQUIS) 2.5 MG TABS tablet Take 1 tablet (2.5 mg total) by mouth 2 (two) times daily.   azelastine (ASTELIN) 0.1 % nasal spray Place 2 sprays into both nostrils 2 (two) times daily.   B Complex-C (B-COMPLEX WITH VITAMIN C) tablet Take 1 tablet by mouth daily.   Calcium Carbonate-Vit D-Min (CALTRATE MINIS PLUS MINERALS PO) Take 1 tablet by mouth every evening.   cholecalciferol (VITAMIN D) 25 MCG (1000 UNIT) tablet Take 1,000 Units by mouth every evening.   FARXIGA 10 MG TABS tablet Take 10 mg by mouth daily.   Fe Fum-FA-B Cmp-C-Zn-Mg-Mn-Cu (HEMOCYTE PLUS) 106-1 MG CAPS Take 1 capsule by mouth daily.   ipratropium (ATROVENT) 0.06 % nasal spray 2 sprays in each nostril every 6 hours if needed   metoprolol tartrate (LOPRESSOR) 25 MG tablet Take 1 tablet (25 mg total) by mouth 2 (two) times daily.   Multiple Vitamins-Minerals (PRESERVISION AREDS 2+MULTI VIT) CAPS Take by mouth in the morning and at bedtime.  pantoprazole (PROTONIX) 40 MG tablet TAKE ONE TABLET BY MOUTH ONCE DAILY BEFORE meal of choice   pravastatin (PRAVACHOL) 10 MG tablet Take 10 mg by mouth every evening.   SUMAtriptan (IMITREX) 100 MG tablet Take 100 mg by mouth every 2 (two) hours as needed for migraine. May repeat in 2 hours if headache persists or recurs.   torsemide  (DEMADEX) 5 MG tablet Take 5 mg by mouth every morning.     Allergies  Allergen Reactions   Other Nausea And Vomiting    Headache also  Also from artificial sweeetners   Codeine Nausea And Vomiting    Social History   Socioeconomic History   Marital status: Married    Spouse name: Not on file   Number of children: Not on file   Years of education: Not on file   Highest education level: Not on file  Occupational History   Not on file  Tobacco Use   Smoking status: Never   Smokeless tobacco: Never  Substance and Sexual Activity   Alcohol use: No   Drug use: No   Sexual activity: Not Currently  Other Topics Concern   Not on file  Social History Narrative   Not on file   Social Determinants of Health   Financial Resource Strain: Not on file  Food Insecurity: Not on file  Transportation Needs: Not on file  Physical Activity: Not on file  Stress: Not on file  Social Connections: Not on file  Intimate Partner Violence: Not on file     Review of Systems: General: negative for chills, fever, night sweats or weight changes.  Cardiovascular: negative for chest pain, dyspnea on exertion, edema, orthopnea, palpitations, paroxysmal nocturnal dyspnea or shortness of breath Dermatological: negative for rash Respiratory: negative for cough or wheezing Urologic: negative for hematuria Abdominal: negative for nausea, vomiting, diarrhea, bright red blood per rectum, melena, or hematemesis Neurologic: negative for visual changes, syncope, or dizziness All other systems reviewed and are otherwise negative except as noted above.    Blood pressure 128/64, pulse (!) 51, height 5\' 1"  (1.549 m), weight 117 lb 9.6 oz (53.3 kg), SpO2 91 %.  General appearance: alert and no distress Neck: no adenopathy, no JVD, supple, symmetrical, trachea midline, thyroid not enlarged, symmetric, no tenderness/mass/nodules, and bilateral carotid bruits Lungs: clear to auscultation bilaterally Heart:  2/6 systolic ejection murmur at the base consistent with aortic stenosis and 2/6 diastolic decrescendo murmur consistent with aortic insufficiency. Extremities: extremities normal, atraumatic, no cyanosis or edema Pulses: 2+ and symmetric Skin: Skin color, texture, turgor normal. No rashes or lesions Neurologic: Grossly normal  EKG sinus bradycardia 51 without ST or T wave changes.  Personally reviewed this EKG.  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  She has had progressive shortness of breath with recent 2D echocardiogram read by Dr. 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  A transesophageal echo was recommended which we will order.  Paroxysmal atrial fibrillation (HCC) History of paroxysmal A. fib maintaining sinus rhythm after multiple cardioversions on amiodarone.  She is on Eliquis 2.5 mg twice daily.  Hyperlipidemia History of hyperlipidemia on statin therapy with lipid profile performed 03/03/2020 revealing a total cholesterol of [place] MD [facility], [facility] 03/16/2021 11:32 AM

## 2021-03-16 NOTE — Assessment & Plan Note (Signed)
History of hyperlipidemia on statin therapy with lipid profile performed 03/03/2020 revealing a total cholesterol of 171

## 2021-03-19 ENCOUNTER — Ambulatory Visit (HOSPITAL_COMMUNITY)
Admission: RE | Admit: 2021-03-19 | Discharge: 2021-03-19 | Disposition: A | Discharge status: Home / Self Care | Payer: Medicare Other | Source: Ambulatory Visit | Attending: Cardiovascular Disease | Admitting: Cardiovascular Disease

## 2021-03-19 ENCOUNTER — Other Ambulatory Visit: Payer: Self-pay | Admitting: *Deleted

## 2021-03-19 ENCOUNTER — Encounter (HOSPITAL_COMMUNITY): Payer: Medicare Other

## 2021-03-19 ENCOUNTER — Encounter: Payer: Self-pay | Admitting: *Deleted

## 2021-03-19 ENCOUNTER — Other Ambulatory Visit: Payer: Self-pay

## 2021-03-19 ENCOUNTER — Other Ambulatory Visit (HOSPITAL_COMMUNITY): Payer: Self-pay | Admitting: Cardiovascular Disease

## 2021-03-19 DIAGNOSIS — R0989 Other specified symptoms and signs involving the circulatory and respiratory systems: Secondary | ICD-10-CM

## 2021-03-19 DIAGNOSIS — Z8679 Personal history of other diseases of the circulatory system: Secondary | ICD-10-CM

## 2021-03-20 ENCOUNTER — Other Ambulatory Visit: Payer: Self-pay

## 2021-03-20 DIAGNOSIS — R0989 Other specified symptoms and signs involving the circulatory and respiratory systems: Secondary | ICD-10-CM

## 2021-03-20 NOTE — Progress Notes (Signed)
vas 

## 2021-03-21 ENCOUNTER — Encounter: Payer: Self-pay | Admitting: Cardiothoracic Surgery

## 2021-03-23 LAB — CBC
Hematocrit: 33.2 % — ABNORMAL LOW (ref 34.0–46.6)
Hemoglobin: 10 g/dL — ABNORMAL LOW (ref 11.1–15.9)
MCH: 31.3 pg (ref 26.6–33.0)
MCHC: 30.1 g/dL — ABNORMAL LOW (ref 31.5–35.7)
MCV: 104 fL — ABNORMAL HIGH (ref 79–97)
Platelets: 216 10*3/uL (ref 150–450)
RBC: 3.19 x10E6/uL — ABNORMAL LOW (ref 3.77–5.28)
RDW: 14 % (ref 11.7–15.4)
WBC: 5.7 10*3/uL (ref 3.4–10.8)

## 2021-03-23 LAB — BASIC METABOLIC PANEL
BUN/Creatinine Ratio: 13 (ref 12–28)
BUN: 24 mg/dL (ref 8–27)
CO2: 23 mmol/L (ref 20–29)
Calcium: 9.9 mg/dL (ref 8.7–10.3)
Chloride: 102 mmol/L (ref 96–106)
Creatinine, Ser: 1.92 mg/dL — ABNORMAL HIGH (ref 0.57–1.00)
Glucose: 91 mg/dL (ref 65–99)
Potassium: 5 mmol/L (ref 3.5–5.2)
Sodium: 142 mmol/L (ref 134–144)
eGFR: 26 mL/min/{1.73_m2} — ABNORMAL LOW (ref >59)

## 2021-03-24 ENCOUNTER — Other Ambulatory Visit: Payer: Self-pay

## 2021-03-24 ENCOUNTER — Emergency Department (HOSPITAL_COMMUNITY): Payer: Medicare Other

## 2021-03-24 ENCOUNTER — Observation Stay (HOSPITAL_COMMUNITY)
Admission: EM | Admit: 2021-03-24 | Discharge: 2021-03-25 | Disposition: A | Discharge status: Home / Self Care | Payer: Medicare Other | Attending: Internal Medicine | Admitting: Internal Medicine

## 2021-03-24 ENCOUNTER — Encounter (HOSPITAL_COMMUNITY): Payer: Self-pay | Admitting: Emergency Medicine

## 2021-03-24 DIAGNOSIS — I480 Paroxysmal atrial fibrillation: Secondary | ICD-10-CM | POA: Insufficient documentation

## 2021-03-24 DIAGNOSIS — Z20822 Contact with and (suspected) exposure to covid-19: Secondary | ICD-10-CM | POA: Diagnosis not present

## 2021-03-24 DIAGNOSIS — Z7901 Long term (current) use of anticoagulants: Secondary | ICD-10-CM | POA: Insufficient documentation

## 2021-03-24 DIAGNOSIS — R001 Bradycardia, unspecified: Secondary | ICD-10-CM | POA: Diagnosis not present

## 2021-03-24 DIAGNOSIS — Z79899 Other long term (current) drug therapy: Secondary | ICD-10-CM | POA: Insufficient documentation

## 2021-03-24 DIAGNOSIS — J181 Lobar pneumonia, unspecified organism: Principal | ICD-10-CM | POA: Insufficient documentation

## 2021-03-24 DIAGNOSIS — N1832 Chronic kidney disease, stage 3b: Secondary | ICD-10-CM | POA: Diagnosis not present

## 2021-03-24 DIAGNOSIS — I5030 Unspecified diastolic (congestive) heart failure: Secondary | ICD-10-CM | POA: Insufficient documentation

## 2021-03-24 DIAGNOSIS — J189 Pneumonia, unspecified organism: Secondary | ICD-10-CM | POA: Diagnosis not present

## 2021-03-24 DIAGNOSIS — N179 Acute kidney failure, unspecified: Secondary | ICD-10-CM | POA: Insufficient documentation

## 2021-03-24 DIAGNOSIS — I130 Hypertensive heart and chronic kidney disease with heart failure and stage 1 through stage 4 chronic kidney disease, or unspecified chronic kidney disease: Secondary | ICD-10-CM | POA: Diagnosis not present

## 2021-03-24 DIAGNOSIS — J90 Pleural effusion, not elsewhere classified: Secondary | ICD-10-CM | POA: Diagnosis not present

## 2021-03-24 DIAGNOSIS — R0602 Shortness of breath: Secondary | ICD-10-CM | POA: Diagnosis not present

## 2021-03-24 LAB — BASIC METABOLIC PANEL
Anion gap: 7 (ref 5–15)
BUN: 22 mg/dL (ref 8–23)
CO2: 24 mmol/L (ref 22–32)
Calcium: 9.5 mg/dL (ref 8.9–10.3)
Chloride: 107 mmol/L (ref 98–111)
Creatinine, Ser: 1.97 mg/dL — ABNORMAL HIGH (ref 0.44–1.00)
GFR, Estimated: 25 mL/min — ABNORMAL LOW (ref >60)
Glucose, Bld: 109 mg/dL — ABNORMAL HIGH (ref 70–99)
Potassium: 4.5 mmol/L (ref 3.5–5.1)
Sodium: 138 mmol/L (ref 135–145)

## 2021-03-24 LAB — BRAIN NATRIURETIC PEPTIDE: B Natriuretic Peptide: 2655.4 pg/mL — ABNORMAL HIGH (ref 0.0–100.0)

## 2021-03-24 LAB — RESP PANEL BY RT-PCR (FLU A&B, COVID) ARPGX2
Influenza A by PCR: NEGATIVE
Influenza B by PCR: NEGATIVE
SARS Coronavirus 2 by RT PCR: NEGATIVE

## 2021-03-24 LAB — CBC
HCT: 31.6 % — ABNORMAL LOW (ref 36.0–46.0)
Hemoglobin: 10.1 g/dL — ABNORMAL LOW (ref 12.0–15.0)
MCH: 32.1 pg (ref 26.0–34.0)
MCHC: 32 g/dL (ref 30.0–36.0)
MCV: 100.3 fL — ABNORMAL HIGH (ref 80.0–100.0)
Platelets: 211 10*3/uL (ref 150–400)
RBC: 3.15 MIL/uL — ABNORMAL LOW (ref 3.87–5.11)
RDW: 15.5 % (ref 11.5–15.5)
WBC: 6.2 10*3/uL (ref 4.0–10.5)
nRBC: 0 % (ref 0.0–0.2)

## 2021-03-24 LAB — TROPONIN I (HIGH SENSITIVITY)
Troponin I (High Sensitivity): 25 ng/L — ABNORMAL HIGH (ref <18)
Troponin I (High Sensitivity): 27 ng/L — ABNORMAL HIGH (ref <18)

## 2021-03-24 LAB — HIV ANTIBODY (ROUTINE TESTING W REFLEX): HIV Screen 4th Generation wRfx: NONREACTIVE

## 2021-03-24 MED ORDER — SODIUM CHLORIDE 0.9 % IV SOLN: 2.0000 g | INTRAVENOUS | Status: DC

## 2021-03-24 MED ORDER — PANTOPRAZOLE SODIUM 40 MG PO TBEC
40.0000 mg | DELAYED_RELEASE_TABLET | Freq: Every day | ORAL | Status: DC
  Administered 2021-03-25: 40 mg via ORAL
  Filled 2021-03-24: qty 1

## 2021-03-24 MED ORDER — APIXABAN 2.5 MG PO TABS
2.5000 mg | ORAL_TABLET | Freq: Two times a day (BID) | ORAL | Status: DC
  Administered 2021-03-24 – 2021-03-25 (×2): 2.5 mg via ORAL
  Filled 2021-03-24 (×2): qty 1

## 2021-03-24 MED ORDER — ALBUTEROL SULFATE (2.5 MG/3ML) 0.083% IN NEBU: 3.0000 mL | INHALATION_SOLUTION | Freq: Four times a day (QID) | RESPIRATORY_TRACT | Status: DC | PRN

## 2021-03-24 MED ORDER — SODIUM CHLORIDE 0.9 % IV SOLN
500.0000 mg | INTRAVENOUS | Status: DC
  Filled 2021-03-24: qty 500

## 2021-03-24 MED ORDER — ENOXAPARIN SODIUM 30 MG/0.3ML IJ SOSY: 30.0000 mg | PREFILLED_SYRINGE | INTRAMUSCULAR | Status: DC

## 2021-03-24 MED ORDER — AMIODARONE HCL 200 MG PO TABS: 100.0000 mg | ORAL_TABLET | ORAL | Status: DC

## 2021-03-24 MED ORDER — PRAVASTATIN SODIUM 10 MG PO TABS
10.0000 mg | ORAL_TABLET | Freq: Every evening | ORAL | Status: DC
  Administered 2021-03-24: 10 mg via ORAL
  Filled 2021-03-24 (×2): qty 1

## 2021-03-24 MED ORDER — FUROSEMIDE 10 MG/ML IJ SOLN
20.0000 mg | Freq: Once | INTRAMUSCULAR | Status: AC
  Administered 2021-03-24: 20 mg via INTRAVENOUS
  Filled 2021-03-24: qty 2

## 2021-03-24 MED ORDER — SODIUM CHLORIDE 0.9 % IV SOLN
1.0000 g | Freq: Once | INTRAVENOUS | Status: AC
  Administered 2021-03-24: 1 g via INTRAVENOUS
  Filled 2021-03-24: qty 10

## 2021-03-24 MED ORDER — SUMATRIPTAN SUCCINATE 100 MG PO TABS
100.0000 mg | ORAL_TABLET | ORAL | Status: DC | PRN
  Filled 2021-03-24: qty 1

## 2021-03-24 MED ORDER — SODIUM CHLORIDE 0.9 % IV SOLN
500.0000 mg | Freq: Once | INTRAVENOUS | Status: AC
  Administered 2021-03-24: 500 mg via INTRAVENOUS
  Filled 2021-03-24: qty 500

## 2021-03-24 MED ORDER — TORSEMIDE 10 MG PO TABS
5.0000 mg | ORAL_TABLET | Freq: Every morning | ORAL | Status: DC
  Administered 2021-03-25: 5 mg via ORAL
  Filled 2021-03-24: qty 0.5

## 2021-03-24 MED ORDER — IPRATROPIUM-ALBUTEROL 0.5-2.5 (3) MG/3ML IN SOLN: 3.0000 mL | Freq: Four times a day (QID) | RESPIRATORY_TRACT | Status: DC | PRN

## 2021-03-24 MED ORDER — METOPROLOL TARTRATE 25 MG PO TABS
25.0000 mg | ORAL_TABLET | Freq: Two times a day (BID) | ORAL | Status: DC
  Administered 2021-03-25: 25 mg via ORAL
  Filled 2021-03-24 (×2): qty 1

## 2021-03-24 NOTE — H&P (Signed)
Date: 03/24/2021               Patient Name:  [name] [name] MRN: [id number]  DOB: [birth year] Age / Sex: 80 y.o., female   PCP: [name], MD         Medical Service: Internal Medicine Teaching Service         Attending Physician: Dr. Sid Falcon, MD    First Contact: Lajean Manes, MD Pager: MP [phone]  Second Contact: Virl Axe, MD Pager: Sandrea Hammond [phone]       After Hours (After 5p/  First Contact Pager: [phone]  weekends / holidays): Second Contact Pager: [phone]    Chief Complaint: trouble breathing  History of Present Illness:  [name] [name] is a 80 y.o. F with PMH of HTN, HLD, aortic stenosis s/p porcine aortic valve replacement in 2004 (LVEF 55-60% 03/06/21), thoracic ascending aortic aneurysm, atrial fibrillation, and chronic anticoagulation on Eliquis who presents to the emergency department with complaints of progressively worsening SOB x 5 days that is most notable with exertion, and better with rest. She does not use O2 at home. She decided to come to the ED today because rolling over in bed caused significant SOB. She c/o cough with infrequent non-bloody, greenish yellow sputum production, particularly in the morning.  Pt denies fever, N/V, hemoptysis, abdominal pain, CP, and leg pain or swelling of th legs. She had a pneumonia in feb which did not require hospitalization, treated with oral abx. She did not take any medication for these symptoms since onset.   ED course:  Saturating 93 on RA, and 97 with 1-2L [place]. SBPs 130-150s, pulse 50s, and afebrile. No leukocytosis and no thrombocytosis. Trop 25 -> 27. BNP elevated, 2655. CXR shows RML atelectasis vs infiltrate, but no fluid overload. COVID negative.   Meds:  Current Meds  Medication Sig   albuterol (VENTOLIN HFA) 108 (90 Base) MCG/ACT inhaler Inhale 2 puffs into the lungs every 6 (six) hours as needed for wheezing or shortness of breath.   amiodarone (PACERONE) 200 MG tablet Take 1/2 tablet by mouth  daily only on Monday, Wednesday, Friday and Saturday   apixaban (ELIQUIS) 2.5 MG TABS tablet Take 1 tablet (2.5 mg total) by mouth 2 (two) times daily.   azelastine (ASTELIN) 0.1 % nasal spray Place 2 sprays into both nostrils daily as needed for rhinitis or allergies.   b complex vitamins capsule Take 1 capsule by mouth daily.   Calcium Carbonate-Vit D-Min (CALTRATE MINIS PLUS MINERALS PO) Take 40 mcg by mouth every evening.   cholecalciferol (VITAMIN D) 25 MCG (1000 UNIT) tablet Take 1,000 Units by mouth every evening.   Fe Fum-FA-B Cmp-C-Zn-Mg-Mn-Cu (HEMOCYTE PLUS) 106-1 MG CAPS Take 1 capsule by mouth daily.   ipratropium (ATROVENT) 0.06 % nasal spray 2 sprays in each nostril every 6 hours if needed   ipratropium-albuterol (DUONEB) 0.5-2.5 (3) MG/3ML SOLN Inhale 3 mLs into the lungs every 6 (six) hours as needed.   metoprolol tartrate (LOPRESSOR) 25 MG tablet Take 1 tablet (25 mg total) by mouth 2 (two) times daily.   Multiple Vitamins-Minerals (HAIR/SKIN/NAILS) TABS Take 1 tablet by mouth daily at 2 am. BioSil   Multiple Vitamins-Minerals (PRESERVISION AREDS 2+MULTI VIT) CAPS Take by mouth in the morning and at bedtime.   pantoprazole (PROTONIX) 40 MG tablet TAKE ONE TABLET BY MOUTH ONCE DAILY BEFORE meal of choice   Polyethyl Glycol-Propyl Glycol (SYSTANE) 0.4-0.3 % SOLN Place 1 drop into both eyes See admin instructions. Five  times a week   pravastatin (PRAVACHOL) 10 MG tablet Take 10 mg by mouth every evening.   SUMAtriptan (IMITREX) 100 MG tablet Take 100 mg by mouth every 2 (two) hours as needed for migraine. May repeat in 2 hours if headache persists or recurs.   torsemide (DEMADEX) 5 MG tablet Take 5 mg by mouth every morning.    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 Tharon Aquas Trigt   Headache(784.0)    Heart murmur    History of migraine headaches    Hyperlipidemia    on statin therapy   Migraines    Thoracic ascending aortic aneurysm [facility])    CTA CHEST 05/11/12    Family History:  Mother and father- lived until their 14s, no known PMH Grandfather and sister- diabetes   Social History:   Lives alone, and can perform ADLs independently at baseline.  Currently retired  Denies use or hx of tobacco, alcohol, or illicit drug use.   Review of Systems: A complete ROS was negative except as per HPI.    Physical Exam: Blood pressure (!) 153/59, pulse (!) 55, temperature (!) 97.4 F (36.3 C), resp. rate (!) 31, SpO2 97 %.  Constitutional: alert, well-appearing, in no acute distress HENT: normocephalic, atraumatic, mucous membranes moist Eyes: conjunctiva non-erythematous, extraocular movements intact Neck: supple Cardiovascular: regular rate and rhythm, no m/r/g Pulmonary/Chest: normal work of breathing on room air, reduced breath sounds over right lower lung field, possible crackles on left lower lung fields.  Abdominal: soft, non-tender to palpation, non-distended MSK: normal bulk and tone Neurological: alert & oriented x 3 Skin: warm and dry, slightly decreased skin turgor, no pitting edema  Extremities: no swelling or pitting edema  Psych: normal behavior, normal affect  EKG: Sinus bradycardia with 1st degree A-V block  CXR: right middle lobe atelectasis or infiltrate  Assessment & Plan by Problem: Active Problems:   Community acquired pneumonia  [name] [name] is a 80 y.o. F with PMH of HTN, HLD, aortic stenosis s/p aortic valve replacement (EF 55-60% 03/06/21), thoracic ascending aortic aneurysm, atrial fibrillation,  and chronic anticoagulation on Eliquis presenting for SOB and productive cough, admitted for CAP.   CAP 80 yo patient with SOB and greenish productive cough x 5 days. Pt is afebrile, and denies chills or chest pain. Complains of SOB with ambulation. O2 sat 93 on RA and 97 with 2L [place] supplemental O2, does not use O2 at home. CXR positive for RML infiltrate. Benign physical exam. Differential includes CHF exacerbation given hx of porcine aortic valve in 2004 and elevated BNP of 2655 (no baseline BNP comparison), but less likely given most recent LVEF on 03/06/21 is 55-60% and no indication of fluid overload on exam and CXR more consistent with lobar pneumonia as opposed to vascular congestion. PE 2/2 DVT unlikely given chronic anticoagulation with Eliquis and no concerning physical exam findings.  -Continue IV azithromycin and ceftrixone (day 1 of 5) -Strep pneumonia urinary antigen ordered  -2L [place], maintain O2 >92% -Continued PRN home duoneb and  albuterol -delirium precautions   HFpEF Elevated BNP of 2655 (no baseline BNP comparison), 03/06/21 is 55-60%, normal left ventricular function, grade 2 diastolic dysfunction, moderate prosthetic valve aortic insuffiencey. She is on torsemide 5 mg qd at home. On exam she does not appear volume overloaded, has slightly decreased skin turgor noted, no appreciable peripheral edema on exam, possible left sided crackles. Pt notes recent decrease in PO intake. CXR more consistent with pneumonia, but cannot completely r/o vascular congestion. Will avoid aggressive diuresis today and reassess tomorrow. Weight remains stable, 53.1 kg -> 53.3 kg over the past month.  -Continue home dose of torsemide, will reevaluate in AM -trial of diuresis, IV Lasix 20 mg    AKI on CKD IIIb Baseline Cr 1.4-1.5, on admission Cr 1.97. Appears slightly dry on exam, but given hx of HFpEF and increased BMP, hold off on IV fluids. Continue to monitor.  -Renal U/S pending  -BMP  A.  Fib Asymptotic, no CP, SOB, palpitations. Continue amiodarone and Eliquis   HTN SBP 130-150s. Continue home lopressor    HLD Continue home pravastatin and torsemide   GERD Continue home Protonix  Hx of migraine Home sumatriptan 100 mg bid  Best Practice: Diet: Heart Healthy IVF: None,None VTE: eliquis 2.5  Code: Full  Signed: Lajean Manes, MD  Internal Medicine Resident, PGY-1 [facility]  Pager: [phone] 2:07 PM, 03/24/2021  After 5pm on weekdays and 1pm on weekends: On Call pager: [phone]

## 2021-03-24 NOTE — ED Triage Notes (Signed)
Pt reports SOB since Monday that is worse since last night.  States SOB increases with lying. Also reports productive cough with yellow sputum.

## 2021-03-24 NOTE — Plan of Care (Signed)
Patient with extensive cardiac history admitted with pneumonia. Troponins and BNP elevated. Tachypneic at rest but comfortable on 2L [place] O2.   Problem: Education: Goal: Knowledge of General Education information will improve Description: Including pain rating scale, medication(s)/side effects and non-pharmacologic comfort measures Outcome: Progressing   Problem: Health Behavior/Discharge Planning: Goal: Ability to manage health-related needs will improve Outcome: Progressing   Problem: Clinical Measurements: Goal: Ability to maintain clinical measurements within normal limits will improve Outcome: Progressing Goal: Will remain free from infection Outcome: Progressing Goal: Diagnostic test results will improve Outcome: Progressing Goal: Respiratory complications will improve Outcome: Progressing Goal: Cardiovascular complication will be avoided Outcome: Progressing   Problem: Activity: Goal: Risk for activity intolerance will decrease Outcome: Progressing   Problem: Nutrition: Goal: Adequate nutrition will be maintained Outcome: Progressing   Problem: Coping: Goal: Level of anxiety will decrease Outcome: Progressing   Problem: Elimination: Goal: Will not experience complications related to bowel motility Outcome: Progressing Goal: Will not experience complications related to urinary retention Outcome: Progressing   Problem: Pain Managment: Goal: General experience of comfort will improve Outcome: Progressing   Problem: Safety: Goal: Ability to remain free from injury will improve Outcome: Progressing   Problem: Skin Integrity: Goal: Risk for impaired skin integrity will decrease Outcome: Progressing

## 2021-03-24 NOTE — ED Provider Notes (Signed)
[place] EMERGENCY DEPARTMENT Provider Note   CSN: [id number] Arrival date & time: 03/24/21  5329     History Chief Complaint  Patient presents with   Shortness of Breath    [name] [name] is a 80 y.o. 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  Patient reports her shortness of breath is progressively worsening, it is most notable with exertion, does not happen with minimal activity such as rolling over in bed.  She is having associated productive cough of phlegm sputum.  She states that the last time she was having problems breathing she had pneumonia, this feels similar in some ways but also different.  She denies fever, chest pain, vomiting, abdominal pain, leg pain/swelling, hemoptysis, recent surgery/trauma, recent long travel, hormone use, personal hx of cancer, or hx of DVT/PE.  She is scheduled for a TEE in the near future to evaluate her valve.    HPI     Past Medical History:  Diagnosis Date   Aortic root dilatation ([place]) 05/11/12   CTA CHEST   Arthritis    Cardiac asystole, post DCCV requiring use of temp. external pacemaker leads 07/19/2012   Dysrhythmia    GERD (gastroesophageal reflux disease)    H/O aortic valve disorder 10/04/2002   Dr. 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   LEFT AND RIGHT HEART CATHETERIZATION WITH CORONARY ANGIOGRAM N/A 06/18/2012   Procedure: LEFT AND RIGHT HEART CATHETERIZATION WITH CORONARY ANGIOGRAM;  Surgeon: Lorretta Harp, MD;  Location: [facility] CATH LAB;  Service: Cardiovascular;  Laterality: N/A;   NM MYOCAR PERF WALL MOTION  10/29/2010   normal   REPLACEMENT ASCENDING AORTA  07/07/2012   Procedure: REPLACEMENT ASCENDING AORTA;  Surgeon:  Ivin Poot, MD;  Location: [place];  Service: Open Heart Surgery;  Laterality: N/A;  CIRCULATORY ARREST   TEE WITHOUT CARDIOVERSION  06/18/2012   Procedure: TRANSESOPHAGEAL ECHOCARDIOGRAM (TEE);  Surgeon: Sanda Klein, MD;  Location: [facility] ENDOSCOPY;  Service: Cardiovascular;  Laterality: N/A;  right and left heart cath after this procedure   TEE WITHOUT CARDIOVERSION  07/15/2012   Procedure: TRANSESOPHAGEAL ECHOCARDIOGRAM (TEE);  Surgeon: Pixie Casino, MD;  Location:  [facility] ENDOSCOPY;  Service: Cardiovascular;  Laterality: N/A;   TONSILLECTOMY       OB History   No obstetric history on file.     No family history on file.  Social History   Tobacco Use   Smoking status: Never   Smokeless tobacco: Never  Substance Use Topics   Alcohol use: No   Drug use: No    Home Medications Prior to Admission medications   Medication Sig Start Date End Date Taking? Authorizing Provider  albuterol (VENTOLIN HFA) 108 (90 Base) MCG/ACT inhaler Inhale 2 puffs into the lungs every 6 (six) hours as needed for wheezing or shortness of breath.    [provider]  amiodarone (PACERONE) 200 MG tablet Take 1/2 tablet by mouth daily only on Monday, Wednesday, Friday and Saturday 03/05/21   Fenton, Clint R, PA  apixaban (ELIQUIS) 2.5 MG TABS tablet Take 1 tablet (2.5 mg total) by mouth 2 (two) times daily. 09/27/20   Lorretta Harp, MD  azelastine (ASTELIN) 0.1 % nasal spray Place 2 sprays into both nostrils daily as needed for rhinitis or allergies. 09/05/20   [provider]  b complex vitamins capsule Take 1 capsule by mouth daily.    [provider]  Calcium Carbonate-Vit D-Min (CALTRATE MINIS PLUS MINERALS PO) Take 40 mcg by mouth every evening.    [provider]  cholecalciferol (VITAMIN D) 25 MCG (1000 UNIT) tablet Take 1,000 Units by mouth every evening.    [provider]  FARXIGA 10 MG TABS tablet Take 10 mg by mouth daily. 02/07/21   [provider]  Fe Fum-FA-B Cmp-C-Zn-Mg-Mn-Cu (HEMOCYTE PLUS) 106-1 MG CAPS Take 1 capsule by mouth daily. 01/18/21   [provider]  ipratropium (ATROVENT) 0.06 % nasal spray 2 sprays in each nostril every 6 hours if needed 02/26/21   Kozlow, Donnamarie Poag, MD  ipratropium-albuterol (DUONEB) 0.5-2.5 (3) MG/3ML SOLN Inhale 3 mLs into the lungs every 6 (six) hours as needed. 10/06/20   [provider]  metoprolol tartrate (LOPRESSOR) 25 MG tablet Take 1 tablet (25 mg total) by  mouth 2 (two) times daily. 06/05/20   Sueanne Margarita, MD  Multiple Vitamins-Minerals (HAIR/SKIN/NAILS) TABS Take 1 tablet by mouth daily at 2 am. BioSil    [provider]  Multiple Vitamins-Minerals (PRESERVISION AREDS 2+MULTI VIT) CAPS Take by mouth in the morning and at bedtime.    [provider]  pantoprazole (PROTONIX) 40 MG tablet TAKE ONE TABLET BY MOUTH ONCE DAILY BEFORE meal of choice 08/30/20   Lorretta Harp, MD  Polyethyl Glycol-Propyl Glycol (SYSTANE) 0.4-0.3 % SOLN Place 1 drop into both eyes See admin instructions. Five times a week    [provider]  pravastatin (PRAVACHOL) 10 MG tablet Take 10 mg by mouth every evening.    [provider]  SUMAtriptan (IMITREX) 100 MG tablet Take 100 mg by mouth every 2 (two) hours as needed for migraine. May repeat in 2 hours if headache persists or recurs.    [provider]  torsemide (DEMADEX) 5 MG tablet Take 5 mg by mouth every morning. 01/17/21   [provider]    Allergies    Other and Codeine  Review of Systems   Review of Systems  Constitutional:  Negative for chills and fever.  Respiratory:  Positive for cough and shortness of breath.   Cardiovascular:  Negative for chest pain and leg swelling.  Gastrointestinal:  Negative for abdominal pain and vomiting.  Genitourinary:  Negative for dysuria.  Neurological:  Negative for syncope.  All other systems reviewed and are negative.  Physical Exam Updated Vital Signs BP (!) 159/50   Pulse (!) 53   Temp (!) 97.4 F (36.3 C)   Resp (!) 27   SpO2 97%   Physical Exam Vitals and nursing note reviewed.  Constitutional:      General: She is not in acute distress.    Appearance: She is well-developed. She is not toxic-appearing.  HENT:     Head: Normocephalic and atraumatic.  Eyes:     General:        Right eye: No discharge.        Left eye: No discharge.     Conjunctiva/sclera: Conjunctivae normal.  Cardiovascular:      Rate and Rhythm: Regular rhythm. Bradycardia present.     Heart sounds: Murmur heard.  Pulmonary:     Effort: Bradypnea present.     Breath sounds: Decreased air movement (right side base) present. No wheezing, rhonchi or rales.     Comments: SPO2 90 to 92% on room air with tachypnea noted. Abdominal:     General: There is no distension.     Palpations: Abdomen is soft.     Tenderness: There is no abdominal tenderness.  Musculoskeletal:     Cervical back: Neck supple.     Comments: No significant pitting edema to the lower extremities.  Skin:    General: Skin is warm and dry.     Findings: No rash.  Neurological:     Mental Status: She is alert.     Comments: Clear speech.   Psychiatric:        Behavior: Behavior normal.    ED Results / Procedures / Treatments   Labs (all labs ordered are listed, but only abnormal results are displayed) Labs Reviewed  BASIC METABOLIC PANEL - Abnormal; Notable for the following components:      Result Value   Glucose, Bld 109 (*)    Creatinine, Ser 1.97 (*)    GFR, Estimated 25 (*)    All other components within normal limits  CBC - Abnormal; Notable for the following components:   RBC 3.15 (*)    Hemoglobin 10.1 (*)    HCT 31.6 (*)    MCV 100.3 (*)    All other components within normal limits  TROPONIN I (HIGH SENSITIVITY) - Abnormal; Notable for the following components:   Troponin I (High Sensitivity) 25 (*)    All other components within normal limits  BRAIN NATRIURETIC PEPTIDE    EKG EKG Interpretation  Date/Time:  Saturday March 24 2021 09:33:58 EDT Ventricular Rate:  52 PR Interval:  224 QRS Duration: 96 QT Interval:  482 QTC Calculation: 448 R Axis:   25 Text Interpretation: Sinus bradycardia with 1st degree A-V block Minimal voltage criteria for LVH, may be normal variant ( Cornell product ) Septal infarct , age undetermined Abnormal ECG No significant change since last tracing Confirmed by Isla Pence [phone]) on  03/24/2021 10:59:24 AM  Radiology DG Chest 2 View  Result Date: 03/24/2021 CLINICAL DATA:  Shortness of breath for 5 days. History of aortic root dilatation. EXAM: CHEST - 2 VIEW COMPARISON:  01/16/2021 FINDINGS: Median sternotomy and aortic valve replacement. Heart size is normal. There is mild perihilar peribronchial thickening and coarsened interstitial markings, stable in appearance. Small RIGHT pleural effusion, similar to previous exam. Interval resolution of small LEFT pleural effusion. There is atelectasis or early infiltrate in the RIGHT middle lobe. IMPRESSION: RIGHT middle lobe atelectasis or infiltrate. Small RIGHT pleural effusion. Electronically Signed   By: Nolon Nations M.D.   On: 03/24/2021 10:40    Procedures Procedures   Medications Ordered in ED Medications - No data to display  ED Course  I have reviewed the triage vital signs and the nursing notes.  Pertinent labs & imaging results that were available during my care of the patient were reviewed by me and considered in my medical decision making (see chart for details).    MDM Rules/Calculators/A&P                           Patient presents to the ED with complaints of dyspnea. Nontoxic, vitals w/ bradycardia & tachypnea. Borderline SPO2 90-92% however given tachypnea placed on supplemental oxygen.  DDX: Pneumonia, CHF, valve dysfunction, PE, ACS, pneumothorax, critical anemia.   Additional history obtained:  Additional history obtained from chart review & nursing note review.   EKG: No significant change compared to prior.   Lab Tests:  I reviewed and interpreted labs, which included:  CBC: Mild anemia similar to prior.  BMP: Renal function similar to prior.  Troponin: Mild elevation.   Imaging Studies ordered:  CXR ordered in triage I independently reviewed, formal radiology impression shows: RIGHT middle lobe atelectasis or infiltrate. Small RIGHT pleural effusion  ED Course:  Patient with findings  suggestive on pneumonia on CXR- dyspneic with productive cough, SpO2 low 90s with tachypnea at rest- requiring supplemental O2 via [place] for comfortable respirations. Will start IV abx and discuss w/ hospitalist service for admission.   12:05: CONSULT: Discussed with internal medicine residency service- accepts admission.   This is a shared visit with supervising physician Dr. Gilford Raid who has independently evaluated patient & provided guidance in evaluation/management/disposition, in agreement with care   Portions of this note were generated with Dragon dictation software. Dictation errors may occur despite best attempts at proofreading.  Final Clinical Impression(s) / ED Diagnoses Final diagnoses:  Community acquired pneumonia of right middle lobe of lung  AKI (acute kidney injury) [facility])    Rx / DC Orders ED Discharge Orders     None        Amaryllis Dyke, PA-C 03/25/21 1219    Isla Pence, MD 03/25/21 [phone]

## 2021-03-24 NOTE — ED Notes (Signed)
Pt placed on 2L [place]. O2 91% on RA. PA Sam aware.

## 2021-03-24 NOTE — Plan of Care (Signed)

## 2021-03-25 ENCOUNTER — Observation Stay (HOSPITAL_COMMUNITY): Payer: Medicare Other

## 2021-03-25 DIAGNOSIS — N1832 Chronic kidney disease, stage 3b: Secondary | ICD-10-CM | POA: Diagnosis not present

## 2021-03-25 DIAGNOSIS — J189 Pneumonia, unspecified organism: Secondary | ICD-10-CM | POA: Diagnosis not present

## 2021-03-25 DIAGNOSIS — N179 Acute kidney failure, unspecified: Secondary | ICD-10-CM

## 2021-03-25 DIAGNOSIS — N289 Disorder of kidney and ureter, unspecified: Secondary | ICD-10-CM | POA: Diagnosis not present

## 2021-03-25 DIAGNOSIS — J181 Lobar pneumonia, unspecified organism: Secondary | ICD-10-CM | POA: Diagnosis not present

## 2021-03-25 LAB — BASIC METABOLIC PANEL
Anion gap: 8 (ref 5–15)
BUN: 21 mg/dL (ref 8–23)
CO2: 27 mmol/L (ref 22–32)
Calcium: 9.2 mg/dL (ref 8.9–10.3)
Chloride: 102 mmol/L (ref 98–111)
Creatinine, Ser: 2.01 mg/dL — ABNORMAL HIGH (ref 0.44–1.00)
GFR, Estimated: 24 mL/min — ABNORMAL LOW (ref >60)
Glucose, Bld: 92 mg/dL (ref 70–99)
Potassium: 4.3 mmol/L (ref 3.5–5.1)
Sodium: 137 mmol/L (ref 135–145)

## 2021-03-25 LAB — STREP PNEUMONIAE URINARY ANTIGEN: Strep Pneumo Urinary Antigen: NEGATIVE

## 2021-03-25 MED ORDER — AZITHROMYCIN 250 MG PO TABS: 250.0000 mg | ORAL_TABLET | Freq: Every day | ORAL | Status: DC

## 2021-03-25 MED ORDER — DOXYCYCLINE HYCLATE 100 MG PO TABS
100.0000 mg | ORAL_TABLET | Freq: Two times a day (BID) | ORAL | Status: DC
  Administered 2021-03-25: 100 mg via ORAL
  Filled 2021-03-25: qty 1

## 2021-03-25 MED ORDER — AMOXICILLIN-POT CLAVULANATE 875-125 MG PO TABS: 1.0000 | ORAL_TABLET | Freq: Two times a day (BID) | ORAL | 0 refills | Status: DC

## 2021-03-25 MED ORDER — SODIUM CHLORIDE 0.9 % IV SOLN
1.0000 g | INTRAVENOUS | Status: DC
  Administered 2021-03-25: 1 g via INTRAVENOUS
  Filled 2021-03-25: qty 10

## 2021-03-25 MED ORDER — DOXYCYCLINE HYCLATE 100 MG PO TABS: 100.0000 mg | ORAL_TABLET | Freq: Two times a day (BID) | ORAL | 0 refills | Status: DC

## 2021-03-25 NOTE — Discharge Summary (Signed)
Name: [name] [name] MRN: [id number] DOB: 10-15-1940 80 y.o. PCP: [name], MD  Date of Admission: 03/24/2021  9:28 AM Date of Discharge:  03/25/21 Attending Physician: Dr. Daryll Drown  DISCHARGE DIAGNOSIS:  Primary Problem: CAP  Hospital Problems: Active Problems:   Community acquired pneumonia   HFpEF   AKI on CKD 3b     DISCHARGE MEDICATIONS:   Allergies as of 03/25/2021       Reactions   Other Nausea And Vomiting   Headache also  Also from artificial sweeetners   Codeine Nausea And Vomiting        Medication List     TAKE these medications    albuterol 108 (90 Base) MCG/ACT inhaler Commonly known as: VENTOLIN HFA Inhale 2 puffs into the lungs every 6 (six) hours as needed for wheezing or shortness of breath.   amiodarone 200 MG tablet Commonly known as: PACERONE Take 1/2 tablet by mouth daily only on Monday, Wednesday, Friday and Saturday   amoxicillin-clavulanate 875-125 MG tablet Commonly known as: Augmentin Take 1 tablet by mouth 2 (two) times daily. Start taking on: March 26, 2021   apixaban 2.5 MG Tabs tablet Commonly known as: ELIQUIS Take 1 tablet (2.5 mg total) by mouth 2 (two) times daily.   azelastine 0.1 % nasal spray Commonly known as: ASTELIN Place 2 sprays into both nostrils daily as needed for rhinitis or allergies.   b complex vitamins capsule Take 1 capsule by mouth daily.   CALTRATE MINIS PLUS MINERALS PO Take 40 mcg by mouth every evening.   cholecalciferol 25 MCG (1000 UNIT) tablet Commonly known as: VITAMIN D Take 1,000 Units by mouth every evening.   doxycycline 100 MG tablet Commonly known as: VIBRA-TABS Take 1 tablet (100 mg total) by mouth every 12 (twelve) hours.   Farxiga 10 MG Tabs tablet Generic drug: dapagliflozin propanediol Take 10 mg by mouth daily.   Hemocyte Plus 106-1 MG Caps Take 1 capsule by mouth daily.   ipratropium 0.06 % nasal spray Commonly known as: ATROVENT 2 sprays in each  nostril every 6 hours if needed   ipratropium-albuterol 0.5-2.5 (3) MG/3ML Soln Commonly known as: DUONEB Inhale 3 mLs into the lungs every 6 (six) hours as needed.   metoprolol tartrate 25 MG tablet Commonly known as: LOPRESSOR Take 1 tablet (25 mg total) by mouth 2 (two) times daily.   pantoprazole 40 MG tablet Commonly known as: PROTONIX TAKE ONE TABLET BY MOUTH ONCE DAILY BEFORE meal of choice   pravastatin 10 MG tablet Commonly known as: PRAVACHOL Take 10 mg by mouth every evening.   PreserVision AREDS 2+Multi Vit Caps Take by mouth in the morning and at bedtime.   Hair/Skin/Nails Tabs Take 1 tablet by mouth daily at 2 am. BioSil   SUMAtriptan 100 MG tablet Commonly known as: IMITREX Take 100 mg by mouth every 2 (two) hours as needed for migraine. May repeat in 2 hours if headache persists or recurs.   Systane 0.4-0.3 % Soln Generic drug: Polyethyl Glycol-Propyl Glycol Place 1 drop into both eyes See admin instructions. Five times a week   torsemide 5 MG tablet Commonly known as: DEMADEX Take 5 mg by mouth every morning.        DISPOSITION AND FOLLOW-UP:  Ms.Jeaneen F Meroney was [facility] from [facility] in [facility] condition. At the hospital follow up visit please address:  Follow-up Recommendations: Consults: none Labs: BMP, Strep pneumonia urinary antigen, blood cultures  Studies: none  Medications: amoxicillin-clavulanate,  doxycycline, f/u on torsemide dose/frequency adjustment   Follow-up Appointments: Dr [name]  Dr [facility] COURSE:  Patient Summary: CAP 80 yo patient with SOB and greenish productive cough x 5 days. Pt is afebrile, and denies chills or chest pain. Complains of SOB with ambulation. O2 sat 93 on RA and 97 with 2L [place] supplemental O2, does not use O2 at home. CXR positive for RML infiltrate. Benign physical exam. Differential includes CHF exacerbation given hx of porcine aortic valve in 2004 and elevated BNP of  2655 (no baseline BNP comparison), but less likely given most recent LVEF on 03/06/21 is 55-60% and no indication of fluid overload on exam and CXR more consistent with lobar pneumonia as opposed to vascular congestion. PE 2/2 DVT unlikely given chronic anticoagulation with Eliquis and no concerning physical exam findings. Started on IV azithro and ceftriaxone. Strep pneumonia urinary antigen ordered. She was able to ambulate without the O2, and would like to return home.    HFpEF Elevated BNP of 2655 (no baseline BNP comparison), 03/06/21 is 55-60%, normal left ventricular function, grade 2 diastolic dysfunction, moderate prosthetic valve aortic insuffiencey. She is on torsemide 5 mg qd at home. On exam she does not appear volume overloaded, has slightly decreased skin turgor noted, no appreciable peripheral edema on exam, possible left sided crackles. Pt notes recent decrease in PO intake. CXR more consistent with pneumonia, but cannot completely r/o vascular congestion. Will avoid aggressive diuresis today and reassess tomorrow. Weight remains stable, 53.1 kg -> 53.3 kg over the past month. Trial of diuresis, IV Lasix 20 mg done. Will advise pt to not take home torsemide in setting of infection and dehydration, and to reevaluate with PCP.    AKI on CKD IIIb Baseline Cr 1.4-1.5, on admission Cr 1.97 but stable. Appears slightly dry on exam, but given hx of HFpEF and increased BMP, hold off on IV fluids. Continue to monitor. Renal U/S negative.   A. Fib Asymptotic, no CP, SOB, palpitations. Continue amiodarone and Eliquis    HTN SBP 130-150s. Continue home lopressor     HLD Continue home pravastatin and torsemide    GERD Continue home Protonix   Hx of migraine Home sumatriptan 100 mg bid   DISCHARGE INSTRUCTIONS:   Discharge Instructions     (HEART FAILURE PATIENTS) Call MD:  Anytime you have any of the following symptoms: 1) 3 pound weight gain in 24 hours or 5 pounds in 1 week 2) shortness  of breath, with or without a dry hacking cough 3) swelling in the hands, feet or stomach 4) if you have to sleep on extra pillows at night in order to breathe.   Complete by: As directed    Call MD for:  difficulty breathing, headache or visual disturbances   Complete by: As directed    Call MD for:  persistant dizziness or light-headedness   Complete by: As directed    Call MD for:  persistant nausea and vomiting   Complete by: As directed    Call MD for:  redness, tenderness, or signs of infection (pain, swelling, redness, odor or green/yellow discharge around incision site)   Complete by: As directed    Call MD for:  severe uncontrolled pain   Complete by: As directed    Call MD for:  temperature >100.4   Complete by: As directed    Diet - low sodium heart healthy   Complete by: As directed    Discharge instructions   Complete  by: As directed    Ms. Melcher, it was a pleasure taking care of you during your time here. You came in with a pneumonia and were treated with antibiotics.  Please take note of the following:  1. I have sent prescriptions for two antibiotics for you to take for the next 3 days. One is doxycycline, which you need to take one tablet tonight, and then twice daily from tomorrow. The other is augmentin, which you need to take twice daily starting from tomorrow. Both of these were sent to your preferred pharmacy.  2. Please do not take your home torsemide for the next 2 days. I have reached out to Dr. [name] and he should be contacting you to set up a hospital follow up visit.  3. Please schedule a hospital follow up visit with your primary doctor, Dr. Jeryl Columbia, in 1 week.  4. Please make sure to go for your TEE this Tuesday to further evaluate your heart valves.   Increase activity slowly   Complete by: As directed        SUBJECTIVE:  No acute overnight events. Patient was seen at bedside during rounds this morning. Pt reports feeling well this AM. She has  some SOB and cough, but is improved since admission. Pt denies CP, palpitations, leg swelling and fever. No other complains or concerns at this time.   Discharge Vitals:   BP (!) 123/51 (BP Location: Left Arm)   Pulse 60   Temp 98 F (36.7 C) (Oral)   Resp 20   Ht 5\' 1"  (1.549 m)   Wt 52.8 kg   SpO2 91%   BMI 21.97 kg/m   OBJECTIVE:  Constitutional: alert, well-appearing, in no acute distress HENT: normocephalic, atraumatic, mucous membranes moist Eyes: conjunctiva non-erythematous, extraocular movements intact Neck: supple Cardiovascular: regular rate and rhythm, no m/r/g Pulmonary/Chest: normal work of breathing on room air, lungs clear to auscultation bilaterally Abdominal: soft, non-tender to palpation, non-distended MSK: normal bulk and tone Neurological: alert & oriented x 3 Skin: warm and dry Psych: normal behavior, normal affect  Pertinent Labs, Studies, and Procedures:  CBC Latest Ref Rng & Units 03/24/2021 03/16/2021 05/22/2020  WBC 4.0 - 10.5 K/uL 6.2 5.7 4.3  Hemoglobin 12.0 - 15.0 g/dL 10.1(L) 10.0(L) 11.3(L)  Hematocrit 36.0 - 46.0 % 31.6(L) 33.2(L) 34.8(L)  Platelets 150 - 400 K/uL 211 216 224    CMP Latest Ref Rng & Units 03/25/2021 03/24/2021 03/16/2021  Glucose 70 - 99 mg/dL 92 109(H) 91  BUN 8 - 23 mg/dL 21 22 24   Creatinine 0.44 - 1.00 mg/dL 2.01(H) 1.97(H) 1.92(H)  Sodium 135 - 145 mmol/L 137 138 142  Potassium 3.5 - 5.1 mmol/L 4.3 4.5 5.0  Chloride 98 - 111 mmol/L 102 107 102  CO2 22 - 32 mmol/L 27 24 23   Calcium 8.9 - 10.3 mg/dL 9.2 9.5 9.9  Total Protein 6.5 - 8.1 g/dL - - -  Total Bilirubin 0.3 - 1.2 mg/dL - - -  Alkaline Phos 38 - 126 U/L - - -  AST 15 - 41 U/L - - -  ALT 0 - 44 U/L - - -    DG Chest 2 View  Result Date: 03/24/2021 CLINICAL DATA:  Shortness of breath for 5 days. History of aortic root dilatation. EXAM: CHEST - 2 VIEW COMPARISON:  01/16/2021 FINDINGS: Median sternotomy and aortic valve replacement. Heart size is normal. There  is mild perihilar peribronchial thickening and coarsened interstitial markings, stable in appearance. Small RIGHT pleural  effusion, similar to previous exam. Interval resolution of small LEFT pleural effusion. There is atelectasis or early infiltrate in the RIGHT middle lobe. IMPRESSION: RIGHT middle lobe atelectasis or infiltrate. Small RIGHT pleural effusion. Electronically Signed   By: Nolon Nations M.D.   On: 03/24/2021 10:40   US RENAL  Result Date: 03/25/2021 CLINICAL DATA:  Acute kidney injury EXAM: RENAL / URINARY TRACT ULTRASOUND COMPLETE COMPARISON:  08/19/2020 FINDINGS: Right Kidney: Renal measurements: 7.4 x 3.1 x 3.9 cm = volume: 50 mL. Elevated cortical echogenicity diffusely. Simple cyst measuring 13 mm. No hydronephrosis Left Kidney: Renal measurements: 8.1 x 3.2 x 4 cm = volume: 50 mL. Echogenicity is elevated. No solid mass or hydronephrosis visualized. Cysts measuring up to 1 cm Bladder: Appears normal for degree of bladder distention. Other: Right pleural effusion, known from chest x-ray yesterday. 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 Electronically Signed   By: Monte Fantasia M.D.   On: 03/25/2021 10:29     Signed: Lajean Manes, MD Internal Medicine Resident, PGY-1 [facility]  Pager: [phone] 1:23 PM, 03/25/2021

## 2021-03-25 NOTE — Progress Notes (Signed)
Provided discharge education/instructions, all questions and concerns addressed, Pt not in acute distress, Pt weaned to room air with O2 sat of 92%, denies SOB. Pt discharged home with belongings accompanied by son.

## 2021-03-25 NOTE — Progress Notes (Signed)
  Date: 03/25/2021  Patient name: [name] [name]  Medical record number: [id number]  Date of birth: 05/09/1941   I have seen and evaluated [name] [name] and discussed their care with the Residency Team. Briefly, [name] [name] is an 80 year old woman who presented with worsening SOB X 5 days, sputum production, cough and was [name] to have a pneumonia.  She has an AVR and concern for AV insufficiency as well, however, she has limited signs of volume overload at this time.  She had a dose of IV lasix overnight with increased urination.  Her SOB and oxygen needs are much improved this morning.  Renal function is relatively stable, but remains above baseline.  She would like to return home today, has walked without oxygen and feels much better.   Vitals:   03/25/21 0800 03/25/21 0851  BP:    Pulse: 68 67  Resp: 19   Temp:    SpO2: 95%    Gen: Thin, elderly woman, NAD Eyes; Anicteric sclerae HENT: Neck is supple, JVD up to mandible, but difficult to assess CV: RR, NR, + systolic murmur with opening snap, best hear at sternal border, trace peripheral edema of the lower extremities Pulm: Crackles in left base, rales in right base and mid lung, otherwise clear without wheezing MSK: Normal tone and bulk Skin: No rash or wounds on exposed skin.   Assessment and Plan: I have seen and evaluated the patient as outlined above. I agree with the formulated Assessment and Plan as detailed in the residents' note, with the following changes:   1. CAP - Strep urinary antigen pending - Transition to oral Abx, augmentin and doxycycline today - Follow up blood cultures  2. AKI on CKD 3b - Hold torsemide for 2 days at home, resume if swelling occurs or weight gain - Repeat labs outpatient this week.   3. AI with prosthetic aortic valve - TEE planned for Tuesday, discuss with [name] [name] regarding restarting torsemide   Her other chronic medications were continued.  She will need to avoid  Azithromycin given her co-treatment with Amiodarone for Afib.  If she continues to do well, she would like to go home today.  She will need repeat blood work this week or next to evaluate her renal function.   [name] Falcon, MD 8/21/20229:20 AM

## 2021-03-25 NOTE — Plan of Care (Deleted)
Received Pt on bed, sleepy but arousable. VS taken and recorded. Pt was then alert and oriented x 4. Pt complained of pain to R eye and asked for warm wash cloth to wipe his face. Pt continued to complain of R eye pain and asking for eyedrops.  Colon Branch, RN called and she notified anesthesia. Ivery Quale called, left RN's callback number to voicemail for further orders.  Prevena was on and plugged to the wall. Noted leakage error, per PACU RN, PA was notified and made aware of this. LLE repositioned and elevated, Prevena turned on/off but continued to have leakage error.   Problem: Health Behavior/Discharge Planning: Goal: Ability to manage health-related needs will improve Outcome: Progressing   Problem: Clinical Measurements: Goal: Ability to maintain clinical measurements within normal limits will improve Outcome: Progressing   Problem: Activity: Goal: Risk for activity intolerance will decrease Outcome: Progressing   Problem: Nutrition: Goal: Adequate nutrition will be maintained Outcome: Progressing   Problem: Coping: Goal: Level of anxiety will decrease Outcome: Progressing   Problem: Elimination: Goal: Will not experience complications related to bowel motility Outcome: Progressing   Problem: Pain Managment: Goal: General experience of comfort will improve Outcome: Progressing   Problem: Safety: Goal: Ability to remain free from injury will improve Outcome: Progressing   Problem: Skin Integrity: Goal: Risk for impaired skin integrity will decrease Outcome: Progressing

## 2021-03-25 NOTE — Hospital Course (Signed)
CAP 80 yo patient with SOB and greenish productive cough x 5 days. Pt is afebrile, and denies chills or chest pain. Complains of SOB with ambulation. O2 sat 93 on RA and 97 with 2L [place] supplemental O2, does not use O2 at home. CXR positive for RML infiltrate. Benign physical exam. Differential includes CHF exacerbation given hx of porcine aortic valve in 2004 and elevated BNP of 2655 (no baseline BNP comparison), but less likely given most recent LVEF on 03/06/21 is 55-60% and no indication of fluid overload on exam and CXR more consistent with lobar pneumonia as opposed to vascular congestion. PE 2/2 DVT unlikely given chronic anticoagulation with Eliquis and no concerning physical exam findings. Started on IV azithro and ceftriaxone. Strep pneumonia urinary antigen ordered. She was able to ambulate without the O2, and would like to return home.    HFpEF Elevated BNP of 2655 (no baseline BNP comparison), 03/06/21 is 55-60%, normal left ventricular function, grade 2 diastolic dysfunction, moderate prosthetic valve aortic insuffiencey. She is on torsemide 5 mg qd at home. On exam she does not appear volume overloaded, has slightly decreased skin turgor noted, no appreciable peripheral edema on exam, possible left sided crackles. Pt notes recent decrease in PO intake. CXR more consistent with pneumonia, but cannot completely r/o vascular congestion. Will avoid aggressive diuresis today and reassess tomorrow. Weight remains stable, 53.1 kg -> 53.3 kg over the past month. Trial of diuresis, IV Lasix 20 mg done. Will advise pt to not take home torsemide in setting of infection and dehydration, and to reevaluate with PCP.    AKI on CKD IIIb Baseline Cr 1.4-1.5, on admission Cr 1.97 but stable. Appears slightly dry on exam, but given hx of HFpEF and increased BMP, hold off on IV fluids. Continue to monitor. Renal U/S negative.   A. Fib Asymptotic, no CP, SOB, palpitations. Continue amiodarone and Eliquis    HTN SBP  130-150s. Continue home lopressor     HLD Continue home pravastatin and torsemide    GERD Continue home Protonix   Hx of migraine Home sumatriptan 100 mg bid

## 2021-03-25 NOTE — Plan of Care (Signed)

## 2021-03-26 ENCOUNTER — Telehealth: Payer: Self-pay | Admitting: *Deleted

## 2021-03-26 NOTE — Telephone Encounter (Addendum)
-----   Message from Lorretta Harp, MD sent at 03/25/2021  5:15 PM EDT ----- Regarding: F/U Can you arrange F/U for [name] [name] with me later this week please.  JJB  Left message for pt to call to schedule follow up on Friday 03/30/21.

## 2021-03-26 NOTE — Telephone Encounter (Signed)
Spoke with pt, Follow up scheduled  

## 2021-03-27 ENCOUNTER — Ambulatory Visit (HOSPITAL_COMMUNITY): Payer: Medicare Other | Admitting: Certified Registered Nurse Anesthetist

## 2021-03-27 ENCOUNTER — Ambulatory Visit (HOSPITAL_BASED_OUTPATIENT_CLINIC_OR_DEPARTMENT_OTHER)
Admission: RE | Admit: 2021-03-27 | Discharge: 2021-03-27 | Disposition: A | Discharge status: Home / Self Care | Payer: Medicare Other | Source: Ambulatory Visit | Attending: Cardiovascular Disease | Admitting: Cardiovascular Disease

## 2021-03-27 ENCOUNTER — Encounter (HOSPITAL_COMMUNITY): Payer: Self-pay | Admitting: Cardiovascular Disease

## 2021-03-27 ENCOUNTER — Other Ambulatory Visit: Payer: Self-pay

## 2021-03-27 ENCOUNTER — Encounter (HOSPITAL_COMMUNITY)
Admission: RE | Disposition: A | Discharge status: Home / Self Care | Payer: Self-pay | Source: Home / Self Care | Attending: Cardiovascular Disease

## 2021-03-27 ENCOUNTER — Ambulatory Visit (HOSPITAL_COMMUNITY)
Admission: RE | Admit: 2021-03-27 | Discharge: 2021-03-27 | Disposition: A | Discharge status: Home / Self Care | Payer: Medicare Other | Attending: Cardiovascular Disease | Admitting: Cardiovascular Disease

## 2021-03-27 DIAGNOSIS — I340 Nonrheumatic mitral (valve) insufficiency: Secondary | ICD-10-CM | POA: Diagnosis not present

## 2021-03-27 DIAGNOSIS — I480 Paroxysmal atrial fibrillation: Secondary | ICD-10-CM | POA: Diagnosis not present

## 2021-03-27 DIAGNOSIS — Z885 Allergy status to narcotic agent status: Secondary | ICD-10-CM | POA: Diagnosis not present

## 2021-03-27 DIAGNOSIS — I483 Typical atrial flutter: Secondary | ICD-10-CM | POA: Diagnosis not present

## 2021-03-27 DIAGNOSIS — Z953 Presence of xenogenic heart valve: Secondary | ICD-10-CM | POA: Diagnosis not present

## 2021-03-27 DIAGNOSIS — E785 Hyperlipidemia, unspecified: Secondary | ICD-10-CM | POA: Insufficient documentation

## 2021-03-27 DIAGNOSIS — Z7901 Long term (current) use of anticoagulants: Secondary | ICD-10-CM | POA: Insufficient documentation

## 2021-03-27 DIAGNOSIS — I352 Nonrheumatic aortic (valve) stenosis with insufficiency: Secondary | ICD-10-CM

## 2021-03-27 DIAGNOSIS — Z79899 Other long term (current) drug therapy: Secondary | ICD-10-CM | POA: Diagnosis not present

## 2021-03-27 DIAGNOSIS — Z7984 Long term (current) use of oral hypoglycemic drugs: Secondary | ICD-10-CM | POA: Insufficient documentation

## 2021-03-27 DIAGNOSIS — I712 Thoracic aortic aneurysm, without rupture: Secondary | ICD-10-CM | POA: Insufficient documentation

## 2021-03-27 DIAGNOSIS — I351 Nonrheumatic aortic (valve) insufficiency: Secondary | ICD-10-CM

## 2021-03-27 DIAGNOSIS — Z8679 Personal history of other diseases of the circulatory system: Secondary | ICD-10-CM

## 2021-03-27 DIAGNOSIS — I080 Rheumatic disorders of both mitral and aortic valves: Secondary | ICD-10-CM | POA: Insufficient documentation

## 2021-03-27 HISTORY — PX: TEE WITHOUT CARDIOVERSION: SHX5443

## 2021-03-27 LAB — ECHO TEE
AV Mean grad: 13.4 mmHg
AV Peak grad: 26.4 mmHg
Ao pk vel: 2.57 m/s
Area-P 1/2: 3.7 cm2
P 1/2 time: 336 msec

## 2021-03-27 SURGERY — ECHOCARDIOGRAM, TRANSESOPHAGEAL
Anesthesia: General

## 2021-03-27 MED ORDER — PHENYLEPHRINE HCL-NACL 20-0.9 MG/250ML-% IV SOLN
INTRAVENOUS | Status: DC | PRN
  Administered 2021-03-27: 25 ug/min via INTRAVENOUS

## 2021-03-27 MED ORDER — SODIUM CHLORIDE 0.9 % IV SOLN
INTRAVENOUS | Status: DC
  Administered 2021-03-27: 500 mL via INTRAVENOUS

## 2021-03-27 MED ORDER — PROPOFOL 500 MG/50ML IV EMUL
INTRAVENOUS | Status: DC | PRN
  Administered 2021-03-27: 75 ug/kg/min via INTRAVENOUS

## 2021-03-27 MED ORDER — PHENYLEPHRINE HCL-NACL 20-0.9 MG/250ML-% IV SOLN: INTRAVENOUS | Status: DC | PRN

## 2021-03-27 NOTE — Transfer of Care (Signed)
Immediate Anesthesia Transfer of Care Note  Patient: [name] [name]  Procedure(s) Performed: TRANSESOPHAGEAL ECHOCARDIOGRAM (TEE)  Patient Location: Endoscopy Unit  Anesthesia Type:MAC  Level of Consciousness: drowsy and patient cooperative  Airway & Oxygen Therapy: Patient Spontanous Breathing and Patient connected to nasal cannula oxygen  Post-op Assessment: Report given to RN and Post -op Vital signs reviewed and stable  Post vital signs: Reviewed and stable  Last Vitals:  Vitals Value Taken Time  BP 115/32   Temp    Pulse 49 03/27/21 1137  Resp 30 03/27/21 1137  SpO2 96 % 03/27/21 1137    Last Pain:  Vitals:   03/27/21 1137  TempSrc:   PainSc: 0-No pain         Complications: No notable events documented.

## 2021-03-27 NOTE — Interval H&P Note (Signed)
History and Physical Interval Note:  03/27/2021 67:20 AM  [name] [name]  has presented today for surgery, with the diagnosis of [name].  The various methods of treatment have been discussed with the patient and family. After consideration of risks, benefits and other options for treatment, the patient has consented to  Procedure(s): TRANSESOPHAGEAL ECHOCARDIOGRAM (TEE) (N/A) as a surgical intervention.  The patient's history has been reviewed, patient examined, no change in status, stable for surgery.  I have reviewed the patient's chart and labs.  Questions were answered to the patient's satisfaction.     [name]

## 2021-03-27 NOTE — Progress Notes (Signed)
  Echocardiogram Echocardiogram Transesophageal has been performed.  [name] [name] 03/27/2021, 12:27 PM

## 2021-03-27 NOTE — Anesthesia Preprocedure Evaluation (Signed)
Anesthesia Evaluation  Patient identified by MRN, date of birth, ID band Patient awake    Reviewed: Allergy & Precautions, NPO status , Patient's Chart, lab work & pertinent test results  Airway Mallampati: II  TM Distance: >3 FB     Dental   Pulmonary asthma , pneumonia,    breath sounds clear to auscultation       Cardiovascular + dysrhythmias + Valvular Problems/Murmurs AS  Rhythm:Regular Rate:Normal     Neuro/Psych  Headaches,    GI/Hepatic Neg liver ROS, GERD  ,  Endo/Other    Renal/GU Renal disease     Musculoskeletal   Abdominal   Peds  Hematology   Anesthesia Other Findings   Reproductive/Obstetrics                             Anesthesia Physical Anesthesia Plan  ASA: 3  Anesthesia Plan: General   Post-op Pain Management:    Induction: Intravenous  PONV Risk Score and Plan: Propofol infusion  Airway Management Planned: Nasal Cannula and Simple Face Mask  Additional Equipment:   Intra-op Plan:   Post-operative Plan:   Informed Consent: I have reviewed the patients History and Physical, chart, labs and discussed the procedure including the risks, benefits and alternatives for the proposed anesthesia with the patient or authorized representative who has indicated his/her understanding and acceptance.     Dental advisory given  Plan Discussed with: CRNA and Anesthesiologist  Anesthesia Plan Comments:         Anesthesia Quick Evaluation

## 2021-03-27 NOTE — Anesthesia Postprocedure Evaluation (Signed)
Anesthesia Post Note  Patient: [name] [name]  Procedure(s) Performed: TRANSESOPHAGEAL ECHOCARDIOGRAM (TEE)     Patient location during evaluation: Endoscopy Anesthesia Type: General Level of consciousness: awake Pain management: pain level controlled Respiratory status: spontaneous breathing Cardiovascular status: stable Postop Assessment: no apparent nausea or vomiting Anesthetic complications: no   No notable events documented.  Last Vitals:  Vitals:   03/27/21 1155 03/27/21 1200  BP: (!) 117/46   Pulse: (!) 53 (!) 52  Resp: (!) 26 (!) 22  Temp:    SpO2: 94% 95%    Last Pain:  Vitals:   03/27/21 1146  TempSrc: Temporal  PainSc: 0-No pain                 Zorana Brockwell

## 2021-03-27 NOTE — Anesthesia Procedure Notes (Signed)
Procedure Name: MAC Date/Time: 03/27/2021 11:13 AM Performed by: Kathryne Hitch, CRNA Pre-anesthesia Checklist: Patient identified, Emergency Drugs available, Suction available and Patient being monitored Patient Re-evaluated:Patient Re-evaluated prior to induction Oxygen Delivery Method: Nasal cannula Preoxygenation: Pre-oxygenation with 100% oxygen Induction Type: IV induction Placement Confirmation: positive ETCO2 Dental Injury: Teeth and Oropharynx as per pre-operative assessment

## 2021-03-27 NOTE — Op Note (Signed)
INDICATIONS: bioprosthetic valve dysfunction  PROCEDURE:   Informed consent was obtained prior to the procedure. The risks, benefits and alternatives for the procedure were discussed and the patient comprehended these risks.  Risks include, but are not limited to, cough, sore throat, vomiting, nausea, somnolence, esophageal and stomach trauma or perforation, bleeding, low blood pressure, aspiration, pneumonia, infection, trauma to the teeth and death.    During this procedure the patient was administered IV propofol by Anesthesiology (Dr. Nyoka Cowden).  The transesophageal probe was inserted in the esophagus and stomach without difficulty and multiple views were obtained.  The patient was kept under observation until the patient left the procedure room.  The patient left the procedure room in stable condition.   Agitated microbubble saline contrast was not administered.  COMPLICATIONS:    There were no immediate complications.  FINDINGS:  Normal LV systolic function. Diastolic dysfunction with elevated mean LA pressure. Bioprosthetic aortic valve with calcific degeneration, moderate-to-severe AI and mild AS  RECOMMENDATIONS:     Continue evaluation for valve-in valve TAVR.  Time Spent Directly with the Patient:  30 minutes   [name] [name] 03/27/2021, 11:32 AM

## 2021-03-27 NOTE — Discharge Instructions (Signed)

## 2021-03-28 ENCOUNTER — Ambulatory Visit: Payer: Medicare Other | Admitting: Allergy and Immunology

## 2021-03-28 ENCOUNTER — Encounter (HOSPITAL_COMMUNITY): Payer: Self-pay | Admitting: Cardiovascular Disease

## 2021-03-30 ENCOUNTER — Encounter: Payer: Self-pay | Admitting: Cardiovascular Disease

## 2021-03-30 ENCOUNTER — Other Ambulatory Visit: Payer: Self-pay

## 2021-03-30 ENCOUNTER — Ambulatory Visit (INDEPENDENT_AMBULATORY_CARE_PROVIDER_SITE_OTHER): Payer: Medicare Other | Admitting: Cardiovascular Disease

## 2021-03-30 VITALS — BP 124/80 | HR 50 | Ht 61.0 in | Wt 116.6 lb

## 2021-03-30 DIAGNOSIS — I4819 Other persistent atrial fibrillation: Secondary | ICD-10-CM | POA: Diagnosis not present

## 2021-03-30 MED ORDER — TORSEMIDE 10 MG PO TABS: 10.0000 mg | ORAL_TABLET | Freq: Every morning | ORAL | 1 refills | Status: DC

## 2021-03-30 NOTE — Patient Instructions (Addendum)
Medication Instructions:  Increase Torsemide to 10 mg daily   *If you need a refill on your cardiac medications before your next appointment, please call your pharmacy*   Lab Work: BMET in one week   If you have labs (blood work) drawn today and your tests are completely normal, you will receive your results only by: [place] (if you have MyChart) OR A paper copy in the mail If you have any lab test that is abnormal or we need to change your treatment, we will call you to review the results.   Follow-Up: At [facility], you and your health needs are our priority.  As part of our continuing mission to provide you with exceptional heart care, we have created designated Provider Care Teams.  These Care Teams include your primary Cardiologist (physician) and Advanced Practice Providers (APPs -  Physician Assistants and Nurse Practitioners) who all work together to provide you with the care you need, when you need it.  We recommend signing up for the patient portal called "MyChart".  Sign up information is provided on this After Visit Summary.  MyChart is used to connect with patients for Virtual Visits (Telemedicine).  Patients are able to view lab/test results, encounter notes, upcoming appointments, etc.  Non-urgent messages can be sent to your provider as well.   To learn more about what you can do with MyChart, go to [email or web address].    Your next appointment:   2 months  The format for your next appointment:   In Person  Provider:   Quay Burow, MD

## 2021-03-30 NOTE — Progress Notes (Signed)
[name] [name] returns for follow-up of her recent TEE done this past Tuesday in the evaluation of prosthetic valve AI which she is symptomatic from.  This showed moderate to severe AI with mild AS.  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  She is very symptomatic with severe dyspnea.  She may be a candidate for "valve in valve" TAVR.  She has an appointment with Dr. Angelena Form to discuss this next week.  I am going to increase her torsemide from 5 to 10 mg a day and we will check a basic metabolic panel in 1 week.  I will see her back in 2 months for follow-up.  Lorretta Harp, M.D., [place], [facility], [place] [address] [place],   [id number]  [phone] 03/30/2021 12:29 PM

## 2021-04-02 DIAGNOSIS — I509 Heart failure, unspecified: Secondary | ICD-10-CM | POA: Diagnosis not present

## 2021-04-02 DIAGNOSIS — J90 Pleural effusion, not elsewhere classified: Secondary | ICD-10-CM | POA: Diagnosis not present

## 2021-04-02 DIAGNOSIS — J189 Pneumonia, unspecified organism: Secondary | ICD-10-CM | POA: Diagnosis not present

## 2021-04-02 DIAGNOSIS — N1832 Chronic kidney disease, stage 3b: Secondary | ICD-10-CM | POA: Diagnosis not present

## 2021-04-03 NOTE — Progress Notes (Signed)
[facility] Consult Note  Chief Complaint  Patient presents with   New Patient (Initial Visit)    Aortic valve disease   History of Present Illness:80 yo female with history of paroxysmal atrial fibrillation, thoracic aortic aneurysm, aortic valve disease s/p bioprosthetic AVR in 2004, GERD, hyperlipidemia who now has evidence of bioprosthetic aortic valve dysfunction and is here today as a new consult, referred by Dr. [name], for discussion regarding possible valve in valve TAVR. She underwent AVR in 2004 with a bioprosthetic AVR and aortic root replacement at that time. (21 mm Edwards Perimount [id number] bioprosthetic valve-Dr. Prescott Gum). She has recently had progressive dyspnea. Echo 03/06/21 with LVEF=55-60%, moderate LVH. Mild mitral regurgitation. The bioprosthetic aortic valve has at least moderate aortic insufficiency with moderate aortic stenosis, mean gradient 28 mmHg. TEE 03/27/21 showed moderate to severe AI with PHT 336 msec. Mean gradient 13.4 mmHg. Minimal carotid artery disease by dopplers August 2022. She has paroxysmal atrial fibrillation on Eliquis and amiodarone.   She tells me today that she has had progressive chest tightness, dyspnea on exertion and fatigue and LE edema. She is feeling somewhat better on Torsemide. She lives in [place] alone. Her son lives in [place]. She has some dental implants. She sees a dentist regularly and has no active dental issues. She is a retired [employer].   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 external pacemaker leads 07/19/2012   Dysrhythmia    GERD (gastroesophageal reflux disease)    H/O aortic valve disorder 10/04/2002   Dr. Tharon Aquas Trigt   Headache(784.0)    Heart murmur    History of migraine headaches     Hyperlipidemia    on statin therapy   Migraines    Thoracic ascending aortic aneurysm ([place])    CTA CHEST 05/11/12    Past Surgical History:  Procedure Laterality Date   ABDOMINAL HYSTERECTOMY     AORTIC VALVE REPLACEMENT  10/04/2002    Dr. 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   LEFT AND RIGHT HEART CATHETERIZATION WITH CORONARY ANGIOGRAM N/A 06/18/2012   Procedure: LEFT AND RIGHT HEART CATHETERIZATION WITH CORONARY ANGIOGRAM;  Surgeon: Lorretta Harp, MD;  Location: [facility] CATH LAB;  Service: Cardiovascular;  Laterality: N/A;   NM MYOCAR PERF WALL MOTION  10/29/2010   normal   REPLACEMENT ASCENDING AORTA  07/07/2012   Procedure: REPLACEMENT ASCENDING AORTA;  Surgeon: Ivin Poot, MD;  Location: [place];  Service: Open Heart Surgery;  Laterality: N/A;  CIRCULATORY ARREST   TEE WITHOUT CARDIOVERSION  06/18/2012   Procedure: TRANSESOPHAGEAL ECHOCARDIOGRAM (TEE);  Surgeon: Sanda Klein, MD;  Location: [facility] ENDOSCOPY;  Service: Cardiovascular;  Laterality: N/A;  right and left heart cath after this procedure   TEE WITHOUT CARDIOVERSION  07/15/2012   Procedure: TRANSESOPHAGEAL ECHOCARDIOGRAM (TEE);  Surgeon: Pixie Casino, MD;  Location: [place];  Service: Cardiovascular;  Laterality: N/A;  TEE WITHOUT CARDIOVERSION N/A 03/27/2021   Procedure: TRANSESOPHAGEAL ECHOCARDIOGRAM (TEE);  Surgeon: Sanda Klein, MD;  Location: [facility] ENDOSCOPY;  Service: Cardiovascular;  Laterality: N/A;   TONSILLECTOMY      Current Outpatient Medications  Medication Sig Dispense Refill   albuterol (VENTOLIN HFA) 108 (90 Base) MCG/ACT  inhaler Inhale 2 puffs into the lungs every 6 (six) hours as needed for wheezing or shortness of breath.     amiodarone (PACERONE) 200 MG tablet Take 1/2 tablet by mouth daily only on Monday, Wednesday, Friday and Saturday     apixaban (ELIQUIS) 2.5 MG TABS tablet Take 1 tablet (2.5 mg total) by mouth 2 (two) times daily. 60 tablet 11   azelastine (ASTELIN) 0.1 % nasal spray Place 2 sprays into both nostrils daily as needed for rhinitis or allergies.     b complex vitamins capsule Take 1 capsule by mouth daily.     Calcium Carbonate-Vit D-Min (CALTRATE MINIS PLUS MINERALS PO) Take 40 mcg by mouth every evening.     cholecalciferol (VITAMIN D) 25 MCG (1000 UNIT) tablet Take 1,000 Units by mouth every evening.     FARXIGA 10 MG TABS tablet Take 10 mg by mouth daily.     Fe Fum-FA-B Cmp-C-Zn-Mg-Mn-Cu (HEMOCYTE PLUS) 106-1 MG CAPS Take 1 capsule by mouth daily.     ipratropium (ATROVENT) 0.06 % nasal spray 2 sprays in each nostril every 6 hours if needed 15 mL 5   ipratropium-albuterol (DUONEB) 0.5-2.5 (3) MG/3ML SOLN Inhale 3 mLs into the lungs every 6 (six) hours as needed.     metoprolol tartrate (LOPRESSOR) 25 MG tablet Take 1 tablet (25 mg total) by mouth 2 (two) times daily. 180 tablet 3   Multiple Vitamins-Minerals (HAIR/SKIN/NAILS) TABS Take 1 tablet by mouth daily at 2 am. BioSil     Multiple Vitamins-Minerals (PRESERVISION AREDS 2+MULTI VIT) CAPS Take by mouth in the morning and at bedtime.     pantoprazole (PROTONIX) 40 MG tablet TAKE ONE TABLET BY MOUTH ONCE DAILY BEFORE meal of choice 90 tablet 2   Polyethyl Glycol-Propyl Glycol (SYSTANE) 0.4-0.3 % SOLN Place 1 drop into both eyes See admin instructions. Five times a week     pravastatin (PRAVACHOL) 10 MG tablet Take 10 mg by mouth every evening.     SUMAtriptan (IMITREX) 100 MG tablet Take 100 mg by mouth every 2 (two) hours as needed for migraine. May repeat in 2 hours if headache persists or recurs.     torsemide (DEMADEX) 10 MG tablet  Take 1 tablet (10 mg total) by mouth every morning. 90 tablet 1   amoxicillin-clavulanate (AUGMENTIN) 875-125 MG tablet Take 1 tablet by mouth 2 (two) times daily. (Patient not taking: Reported on 04/04/2021) 6 tablet 0   doxycycline (VIBRA-TABS) 100 MG tablet Take 1 tablet (100 mg total) by mouth every 12 (twelve) hours. (Patient not taking: Reported on 04/04/2021) 7 tablet 0   No current facility-administered medications for this visit.    Allergies  Allergen Reactions   Other Nausea And Vomiting    Headache also  Also from artificial sweeetners   Codeine Nausea And Vomiting    Social History   Socioeconomic History   Marital status: Widowed    Spouse name: Not on file   Number of children: 1   Years of education: Not on file   Highest education level: Not on file  Occupational History   Occupation: Retired-realtor  Tobacco Use   Smoking status: Never   Smokeless tobacco: Never  Substance and Sexual Activity   Alcohol use: No   Drug use: No   Sexual activity: Not Currently  Other Topics Concern   Not on file  Social History Narrative   Not on file   Social Determinants of Health   Financial Resource Strain: Not on file  Food Insecurity: Not on file  Transportation Needs: Not on file  Physical Activity: Not on file  Stress: Not on file  Social Connections: Not on file  Intimate Partner Violence: Not on file    Family History  Problem Relation Age of Onset   Heart attack Father     Review of Systems:  As stated in the HPI and otherwise negative.   BP 108/60   Pulse 60   Ht 5\' 1"  (1.549 m)   Wt 112 lb 6.4 oz (51 kg)   SpO2 97%   BMI 21.24 kg/m   Physical Examination: General: Well developed, well nourished, NAD  HEENT: OP clear, mucus membranes moist  SKIN: warm, dry. No rashes. Neuro: No focal deficits  Musculoskeletal: Muscle strength 5/5 all ext  Psychiatric: Mood and affect normal  Neck: No JVD, no carotid bruits, no thyromegaly, no  lymphadenopathy.  Lungs:Clear bilaterally, no wheezes, rhonci, crackles Cardiovascular: Regular rate and rhythm. Loud, harsh, late peaking systolic murmur, diastolic murmur Abdomen:Soft. Bowel sounds present. Non-tender.  Extremities: No lower extremity edema. Pulses are 2 + in the bilateral DP/PT.  EKG:  EKG is ordered today. The ekg ordered today demonstrates sinus, 1st degree AV bock. LVH  Echo 03/06/21:  1. Left ventricular ejection fraction, by estimation, is 55 to 60%. The  left ventricle has normal function. The left ventricle has no regional  wall motion abnormalities. There is moderate left ventricular hypertrophy.  Left ventricular diastolic  parameters are consistent with Grade II diastolic dysfunction  (pseudonormalization).   2. Right ventricular systolic function is mildly reduced. The right  ventricular size is normal. There is normal pulmonary artery systolic  pressure. The estimated right ventricular systolic pressure is 22.2 mmHg.   3. Left atrial size was mild to moderately dilated.   4. Right atrial size was mildly dilated.   5. The mitral valve is normal in structure. Mild mitral valve  regurgitation. No evidence of mitral stenosis.   6. There is a bioprosthetic aortic valve, the valve is heavily calcified.  Elevated mean gradient 28 mmHg. There is at least moderate eccentric  valvular regurgitation. There is not holodiastolic flow reversal present  in the descending thoracic aorta.  There is some restriction to valve opening due to calcification, but  suspect significant AI contributes to the mean gradient. Would consider  assessment by TEE.   7. Aortic dilatation noted. There is mild dilatation of the aortic root,  measuring 40 mm.   8. The inferior vena cava is normal in size with greater than 50%  respiratory variability, suggesting right atrial pressure of 3 mmHg.   FINDINGS   Left Ventricle: Left ventricular ejection fraction, by estimation, is 55  to  60%. The left ventricle has normal function. The left ventricle has no  regional wall motion abnormalities. The left ventricular internal cavity  size was normal in size. There is   moderate left ventricular hypertrophy. Left ventricular diastolic  parameters are consistent with Grade II diastolic dysfunction  (pseudonormalization).   Right Ventricle: The right ventricular size is normal. No increase in  right ventricular wall thickness. Right ventricular systolic function is  mildly reduced. There is normal pulmonary artery  systolic pressure. The  tricuspid regurgitant velocity is 2.87  m/s, and with an assumed right atrial pressure of 3 mmHg, the estimated  right ventricular systolic pressure is 10.2 mmHg.   Left Atrium: Left atrial size was mild to moderately dilated.   Right Atrium: Right atrial size was mildly dilated.   Pericardium: There is no evidence of pericardial effusion.   Mitral Valve: The mitral valve is normal in structure. There is mild  calcification of the mitral valve leaflet(s). Mild mitral annular  calcification. Mild mitral valve regurgitation. No evidence of mitral  valve stenosis.   Tricuspid Valve: The tricuspid valve is normal in structure. Tricuspid  valve regurgitation is trivial.   Aortic Valve: There is a bioprosthetic aortic valve, the valve is heavily  calcified. Elevated mean gradient 28 mmHg. There is at least moderate  eccentric valvular regurgitation. There is not holodiastolic flow reversal  present in the descending thoracic  aorta. There is some restriction to valve opening due to calcification,  but suspect significant AI contributes to the mean gradient. Would  consider assessment by TEE. The aortic valve has been repaired/replaced.  Aortic valve regurgitation is moderate.  Aortic regurgitation PHT measures 390 msec. Aortic valve mean gradient  measures 26.6 mmHg. Aortic valve peak gradient measures 49.1 mmHg. Aortic  valve area, by VTI  measures 0.90 cm.   Pulmonic Valve: The pulmonic valve was normal in structure. Pulmonic valve  regurgitation is trivial.   Aorta: Aortic dilatation noted. There is mild dilatation of the aortic  root, measuring 40 mm.   Venous: The inferior vena cava is normal in size with greater than 50%  respiratory variability, suggesting right atrial pressure of 3 mmHg.   IAS/Shunts: No atrial level shunt detected by color flow Doppler.      LEFT VENTRICLE  PLAX 2D  LVIDd:         4.80 cm  Diastology  LVIDs:         3.30 cm  LV e' medial:    4.03 cm/s  LV PW:         1.40 cm  LV E/e' medial:  19.7  LV IVS:        1.70 cm  LV e' lateral:   9.28 cm/s  LVOT diam:     2.10 cm  LV E/e' lateral: 8.5  LV SV:         92  LV SV Index:   61  LVOT Area:     3.46 cm      RIGHT VENTRICLE            IVC  RV S prime:     8.92 cm/s  IVC diam: 1.90 cm  TAPSE (M-mode): 1.4 cm  RVSP:           35.9 mmHg   LEFT ATRIUM             Index       RIGHT ATRIUM           Index  LA diam:        3.50 cm 2.33 cm/m  RA Pressure: 3.00 mmHg  LA Vol (A2C):   62.2 ml 41.36 ml/m RA Area:     15.80 cm  LA Vol (A4C):   56.6 ml 37.64 ml/m RA Volume:   41.30 ml  27.46 ml/m  LA Biplane Vol: 59.4 ml 39.50 ml/m   AORTIC VALVE  AV Area (Vmax):    0.87 cm  AV Area (Vmean):  0.91 cm  AV Area (VTI):     0.90 cm  AV Vmax:           350.20 cm/s  AV Vmean:          239.000 cm/s  AV VTI:            1.020 m  AV Peak Grad:      49.1 mmHg  AV Mean Grad:      26.6 mmHg  LVOT Vmax:         87.46 cm/s  LVOT Vmean:        62.860 cm/s  LVOT VTI:          0.264 m  LVOT/AV VTI ratio: 0.26  AI PHT:            390 msec     AORTA  Ao Root diam: 4.00 cm  Ao Asc diam:  3.10 cm   MITRAL VALVE               TRICUSPID VALVE  MV Area (PHT): 4.32 cm    TR Peak grad:   32.9 mmHg  MV Decel Time: 176 msec    TR Vmax:        287.00 cm/s  MV E velocity: 79.34 cm/s  Estimated RAP:  3.00 mmHg  MV A velocity: 48.76 cm/s  RVSP:            35.9 mmHg  MV E/A ratio:  1.63                             SHUNTS                             Systemic VTI:  0.26 m                             Systemic Diam: 2.10 cm   Transesophageal Echo 03/27/21:    1. Left ventricular ejection fraction, by estimation, is 60 to 65%. The  left ventricle has normal function. The left ventricle has no regional  wall motion abnormalities. Left ventricular diastolic parameters are  consistent with Grade II diastolic  dysfunction (pseudonormalization). Elevated left atrial pressure.   2. Right ventricular systolic function is mildly reduced. The right  ventricular size is normal. There is moderately elevated pulmonary artery  systolic pressure.   3. Left atrial size was mildly dilated. No left atrial/left atrial  appendage thrombus was detected.   4. Right atrial size was mildly dilated.   5. The mitral valve is normal in structure. Mild to moderate mitral valve  regurgitation.   6. The aortic valve has been repaired/replaced. There is moderate  calcification of the aortic valve. There is moderate thickening of the  aortic valve. Aortic valve regurgitation is moderate to severe and is  intraanular (there is no perivalvular leak).  Mild aortic valve stenosis. There is a 21 mm Edwards bioprosthetic valve  present in the aortic position. Aortic regurgitation PHT measures 336  msec. Aortic valve mean gradient measures 13.4 mmHg. Aortic valve Vmax  measures 2.57 m/s. Aortic valve  acceleration time measures 114 msec.   FINDINGS   Left Ventricle: Left ventricular ejection fraction, by estimation, is 60  to 65%. The left ventricle has normal function. The left ventricle has no  regional wall motion abnormalities. The left ventricular internal cavity  size was normal in size. There is   no left ventricular hypertrophy. Abnormal (paradoxical) septal motion  consistent with post-operative status. Left ventricular diastolic  parameters are consistent with  Grade II diastolic dysfunction  (pseudonormalization).   Right Ventricle: The right ventricular size is normal. No increase in  right ventricular wall thickness. Right ventricular systolic function is  mildly reduced. There is moderately elevated pulmonary artery systolic  pressure. The tricuspid regurgitant  velocity is 3.81 m/s, and with an assumed right atrial pressure of 5 mmHg,  the estimated right ventricular systolic pressure is 60.7 mmHg.   Left Atrium: Left atrial size was mildly dilated. No left atrial/left  atrial appendage thrombus was detected.   Right Atrium: Right atrial size was mildly dilated.   Pericardium: There is no evidence of pericardial effusion.   Mitral Valve: The mitral valve is normal in structure. Mild mitral annular  calcification. Mild to moderate mitral valve regurgitation, with  centrally-directed jet.   Tricuspid Valve: The tricuspid valve is normal in structure. Tricuspid  valve regurgitation is mild.   Aortic Valve: Unable to get an adequate measurement of the LVOT VTI  profile. The aortic valve has been repaired/replaced. There is moderate  calcification of the aortic valve. There is moderate thickening of the  aortic valve. Aortic valve regurgitation is   moderate to severe. Aortic regurgitation PHT measures 336 msec. Mild  aortic stenosis is present. Aortic valve mean gradient measures 13.4 mmHg.  Aortic valve peak gradient measures 26.4 mmHg. There is a 21 mm Edwards  Perimount [id number] bioprosthetic valve  present in the aortic position.   Pulmonic Valve: The pulmonic valve was normal in structure. Pulmonic valve  regurgitation is trivial.   Aorta: The aortic root, ascending aorta and aortic arch are all  structurally normal, with no evidence of dilitation or obstruction. There  is minimal (Grade I) layered plaque.   IAS/Shunts: No atrial level shunt detected by color flow Doppler.      AORTIC VALVE  AV Vmax:      257.02 cm/s  AV  Vmean:     169.276 cm/s  AV VTI:       0.711 m  AV Peak Grad: 26.4 mmHg  AV Mean Grad: 13.4 mmHg  AI PHT:       336 msec   MITRAL VALVE               TRICUSPID VALVE  MV Area (PHT): 3.70 cm    TR Peak grad:   58.1 mmHg  MV Decel Time: 205 msec    TR Vmax:        381.05 cm/s  MV E velocity: 86.80 cm/s  MV A velocity: 60.90 cm/s  MV E/A ratio:  1.43   Recent Labs: 12/20/2020: ALT 13; TSH 2.414 03/24/2021: B Natriuretic Peptide 2,655.4; Hemoglobin 10.1; Platelets 211 03/25/2021: BUN 21; Creatinine, Ser 2.01; Potassium 4.3; Sodium 137   Wt Readings from Last 3 Encounters:  04/04/21 112 lb 6.4 oz (51 kg)  03/30/21 116 lb 9.6 oz (52.9 kg)  03/27/21 116 lb 6.5 oz (52.8 kg)     Other studies Reviewed: Additional studies/ records that were reviewed today include: echo images, TEE images, office notes, EKG Review of the above records demonstrates: bioprosthetic AVR failure   STS Risk Score: Risk of Mortality: 7.314% Renal Failure: 4.441% Permanent Stroke: 2.503% Prolonged Ventilation: 19.791% DSW Infection: 0.077% Reoperation: 6.517% Morbidity or Mortality: 29.474% Short Length of Stay: 16.298% Long Length of Stay: 14.364%  Assessment  and Plan:   1. Severe Aortic Valve Stenosis: She has failure of her bioprosthetic AVR with severe AI and mild to moderate aortic stenosis. I have personally reviewed the echo images. The aortic valve leaflets are thickened. I think she would benefit from AVR. Given advanced age and prior open chest surgery, she is not a good candidate for conventional AVR by surgical approach. I think she may be a good candidate for "valve in valve" TAVR.   I have reviewed the natural history of aortic stenosis with the patient and their family members  who are present today. We have discussed the limitations of medical therapy and the poor prognosis associated with symptomatic aortic stenosis. We have reviewed potential treatment options, including palliative  medical therapy, conventional surgical aortic valve replacement, and transcatheter aortic valve replacement. We discussed treatment options in the context of the patient's specific comorbid medical conditions.   She would like to proceed with planning for TAVR. I will arrange a right and left heart [facility] at [facility] withDr. [name]. Risks and benefits of the cath procedure and the valve procedure are reviewed with the patient. After the cath, she  will have a cardiac CT, CTA of the chest/abdomen and pelvis, PT assessment and will then be referred to see Dr. Cyndia Bent.  She has had recent carotid artery dopplers.   BMET and CBC today. She will need to hold the Eliquis two days before her cardiac cath.      Current medicines are reviewed at length with the patient today.  The patient does not have concerns regarding medicines.  The following changes have been made:  no change  Labs/ tests ordered today include:   Orders Placed This Encounter  Procedures   CBC   Basic metabolic panel    Disposition:   F/U with the valve team.    Signed, Lauree Chandler, MD 04/04/2021 9:47 AM    [facility] [place], [place], [place]  [address] Phone: [phone]; Fax: [phone]

## 2021-04-03 NOTE — H&P (View-Only) (Signed)
[facility] Consult Note  Chief Complaint  Patient presents with   New Patient (Initial Visit)    Aortic valve disease   History of Present Illness:80 yo female with history of paroxysmal atrial fibrillation, thoracic aortic aneurysm, aortic valve disease s/p bioprosthetic AVR in 2004, GERD, hyperlipidemia who now has evidence of bioprosthetic aortic valve dysfunction and is here today as a new consult, referred by Dr. [name], for discussion regarding possible valve in valve TAVR. She underwent AVR in 2004 with a bioprosthetic AVR and aortic root replacement at that time. (21 mm Edwards Perimount [id number] bioprosthetic valve-Dr. Prescott Gum). She has recently had progressive dyspnea. Echo 03/06/21 with LVEF=55-60%, moderate LVH. Mild mitral regurgitation. The bioprosthetic aortic valve has at least moderate aortic insufficiency with moderate aortic stenosis, mean gradient 28 mmHg. TEE 03/27/21 showed moderate to severe AI with PHT 336 msec. Mean gradient 13.4 mmHg. Minimal carotid artery disease by dopplers August 2022. She has paroxysmal atrial fibrillation on Eliquis and amiodarone.   She tells me today that she has had progressive chest tightness, dyspnea on exertion and fatigue and LE edema. She is feeling somewhat better on Torsemide. She lives in [place] alone. Her son lives in [place]. She has some dental implants. She sees a dentist regularly and has no active dental issues. She is a retired [employer].   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 external pacemaker leads 07/19/2012   Dysrhythmia    GERD (gastroesophageal reflux disease)    H/O aortic valve disorder 10/04/2002   Dr. Tharon Aquas Trigt   Headache(784.0)    Heart murmur    History of migraine headaches     Hyperlipidemia    on statin therapy   Migraines    Thoracic ascending aortic aneurysm ([place])    CTA CHEST 05/11/12    Past Surgical History:  Procedure Laterality Date   ABDOMINAL HYSTERECTOMY     AORTIC VALVE REPLACEMENT  10/04/2002    Dr. 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   LEFT AND RIGHT HEART CATHETERIZATION WITH CORONARY ANGIOGRAM N/A 06/18/2012   Procedure: LEFT AND RIGHT HEART CATHETERIZATION WITH CORONARY ANGIOGRAM;  Surgeon: Lorretta Harp, MD;  Location: [facility] CATH LAB;  Service: Cardiovascular;  Laterality: N/A;   NM MYOCAR PERF WALL MOTION  10/29/2010   normal   REPLACEMENT ASCENDING AORTA  07/07/2012   Procedure: REPLACEMENT ASCENDING AORTA;  Surgeon: Ivin Poot, MD;  Location: [place];  Service: Open Heart Surgery;  Laterality: N/A;  CIRCULATORY ARREST   TEE WITHOUT CARDIOVERSION  06/18/2012   Procedure: TRANSESOPHAGEAL ECHOCARDIOGRAM (TEE);  Surgeon: Sanda Klein, MD;  Location: [facility] ENDOSCOPY;  Service: Cardiovascular;  Laterality: N/A;  right and left heart cath after this procedure   TEE WITHOUT CARDIOVERSION  07/15/2012   Procedure: TRANSESOPHAGEAL ECHOCARDIOGRAM (TEE);  Surgeon: Pixie Casino, MD;  Location: [place];  Service: Cardiovascular;  Laterality: N/A;  TEE WITHOUT CARDIOVERSION N/A 03/27/2021   Procedure: TRANSESOPHAGEAL ECHOCARDIOGRAM (TEE);  Surgeon: Sanda Klein, MD;  Location: [facility] ENDOSCOPY;  Service: Cardiovascular;  Laterality: N/A;   TONSILLECTOMY      Current Outpatient Medications  Medication Sig Dispense Refill   albuterol (VENTOLIN HFA) 108 (90 Base) MCG/ACT  inhaler Inhale 2 puffs into the lungs every 6 (six) hours as needed for wheezing or shortness of breath.     amiodarone (PACERONE) 200 MG tablet Take 1/2 tablet by mouth daily only on Monday, Wednesday, Friday and Saturday     apixaban (ELIQUIS) 2.5 MG TABS tablet Take 1 tablet (2.5 mg total) by mouth 2 (two) times daily. 60 tablet 11   azelastine (ASTELIN) 0.1 % nasal spray Place 2 sprays into both nostrils daily as needed for rhinitis or allergies.     b complex vitamins capsule Take 1 capsule by mouth daily.     Calcium Carbonate-Vit D-Min (CALTRATE MINIS PLUS MINERALS PO) Take 40 mcg by mouth every evening.     cholecalciferol (VITAMIN D) 25 MCG (1000 UNIT) tablet Take 1,000 Units by mouth every evening.     FARXIGA 10 MG TABS tablet Take 10 mg by mouth daily.     Fe Fum-FA-B Cmp-C-Zn-Mg-Mn-Cu (HEMOCYTE PLUS) 106-1 MG CAPS Take 1 capsule by mouth daily.     ipratropium (ATROVENT) 0.06 % nasal spray 2 sprays in each nostril every 6 hours if needed 15 mL 5   ipratropium-albuterol (DUONEB) 0.5-2.5 (3) MG/3ML SOLN Inhale 3 mLs into the lungs every 6 (six) hours as needed.     metoprolol tartrate (LOPRESSOR) 25 MG tablet Take 1 tablet (25 mg total) by mouth 2 (two) times daily. 180 tablet 3   Multiple Vitamins-Minerals (HAIR/SKIN/NAILS) TABS Take 1 tablet by mouth daily at 2 am. BioSil     Multiple Vitamins-Minerals (PRESERVISION AREDS 2+MULTI VIT) CAPS Take by mouth in the morning and at bedtime.     pantoprazole (PROTONIX) 40 MG tablet TAKE ONE TABLET BY MOUTH ONCE DAILY BEFORE meal of choice 90 tablet 2   Polyethyl Glycol-Propyl Glycol (SYSTANE) 0.4-0.3 % SOLN Place 1 drop into both eyes See admin instructions. Five times a week     pravastatin (PRAVACHOL) 10 MG tablet Take 10 mg by mouth every evening.     SUMAtriptan (IMITREX) 100 MG tablet Take 100 mg by mouth every 2 (two) hours as needed for migraine. May repeat in 2 hours if headache persists or recurs.     torsemide (DEMADEX) 10 MG tablet  Take 1 tablet (10 mg total) by mouth every morning. 90 tablet 1   amoxicillin-clavulanate (AUGMENTIN) 875-125 MG tablet Take 1 tablet by mouth 2 (two) times daily. (Patient not taking: Reported on 04/04/2021) 6 tablet 0   doxycycline (VIBRA-TABS) 100 MG tablet Take 1 tablet (100 mg total) by mouth every 12 (twelve) hours. (Patient not taking: Reported on 04/04/2021) 7 tablet 0   No current facility-administered medications for this visit.    Allergies  Allergen Reactions   Other Nausea And Vomiting    Headache also  Also from artificial sweeetners   Codeine Nausea And Vomiting    Social History   Socioeconomic History   Marital status: Widowed    Spouse name: Not on file   Number of children: 1   Years of education: Not on file   Highest education level: Not on file  Occupational History   Occupation: Retired-realtor  Tobacco Use   Smoking status: Never   Smokeless tobacco: Never  Substance and Sexual Activity   Alcohol use: No   Drug use: No   Sexual activity: Not Currently  Other Topics Concern   Not on file  Social History Narrative   Not on file   Social Determinants of Health   Financial Resource Strain: Not on file  Food Insecurity: Not on file  Transportation Needs: Not on file  Physical Activity: Not on file  Stress: Not on file  Social Connections: Not on file  Intimate Partner Violence: Not on file    Family History  Problem Relation Age of Onset   Heart attack Father     Review of Systems:  As stated in the HPI and otherwise negative.   BP 108/60   Pulse 60   Ht 5\' 1"  (1.549 m)   Wt 112 lb 6.4 oz (51 kg)   SpO2 97%   BMI 21.24 kg/m   Physical Examination: General: Well developed, well nourished, NAD  HEENT: OP clear, mucus membranes moist  SKIN: warm, dry. No rashes. Neuro: No focal deficits  Musculoskeletal: Muscle strength 5/5 all ext  Psychiatric: Mood and affect normal  Neck: No JVD, no carotid bruits, no thyromegaly, no  lymphadenopathy.  Lungs:Clear bilaterally, no wheezes, rhonci, crackles Cardiovascular: Regular rate and rhythm. Loud, harsh, late peaking systolic murmur, diastolic murmur Abdomen:Soft. Bowel sounds present. Non-tender.  Extremities: No lower extremity edema. Pulses are 2 + in the bilateral DP/PT.  EKG:  EKG is ordered today. The ekg ordered today demonstrates sinus, 1st degree AV bock. LVH  Echo 03/06/21:  1. Left ventricular ejection fraction, by estimation, is 55 to 60%. The  left ventricle has normal function. The left ventricle has no regional  wall motion abnormalities. There is moderate left ventricular hypertrophy.  Left ventricular diastolic  parameters are consistent with Grade II diastolic dysfunction  (pseudonormalization).   2. Right ventricular systolic function is mildly reduced. The right  ventricular size is normal. There is normal pulmonary artery systolic  pressure. The estimated right ventricular systolic pressure is 54.6 mmHg.   3. Left atrial size was mild to moderately dilated.   4. Right atrial size was mildly dilated.   5. The mitral valve is normal in structure. Mild mitral valve  regurgitation. No evidence of mitral stenosis.   6. There is a bioprosthetic aortic valve, the valve is heavily calcified.  Elevated mean gradient 28 mmHg. There is at least moderate eccentric  valvular regurgitation. There is not holodiastolic flow reversal present  in the descending thoracic aorta.  There is some restriction to valve opening due to calcification, but  suspect significant AI contributes to the mean gradient. Would consider  assessment by TEE.   7. Aortic dilatation noted. There is mild dilatation of the aortic root,  measuring 40 mm.   8. The inferior vena cava is normal in size with greater than 50%  respiratory variability, suggesting right atrial pressure of 3 mmHg.   FINDINGS   Left Ventricle: Left ventricular ejection fraction, by estimation, is 55  to  60%. The left ventricle has normal function. The left ventricle has no  regional wall motion abnormalities. The left ventricular internal cavity  size was normal in size. There is   moderate left ventricular hypertrophy. Left ventricular diastolic  parameters are consistent with Grade II diastolic dysfunction  (pseudonormalization).   Right Ventricle: The right ventricular size is normal. No increase in  right ventricular wall thickness. Right ventricular systolic function is  mildly reduced. There is normal pulmonary artery  systolic pressure. The  tricuspid regurgitant velocity is 2.87  m/s, and with an assumed right atrial pressure of 3 mmHg, the estimated  right ventricular systolic pressure is 54.6 mmHg.   Left Atrium: Left atrial size was mild to moderately dilated.   Right Atrium: Right atrial size was mildly dilated.   Pericardium: There is no evidence of pericardial effusion.   Mitral Valve: The mitral valve is normal in structure. There is mild  calcification of the mitral valve leaflet(s). Mild mitral annular  calcification. Mild mitral valve regurgitation. No evidence of mitral  valve stenosis.   Tricuspid Valve: The tricuspid valve is normal in structure. Tricuspid  valve regurgitation is trivial.   Aortic Valve: There is a bioprosthetic aortic valve, the valve is heavily  calcified. Elevated mean gradient 28 mmHg. There is at least moderate  eccentric valvular regurgitation. There is not holodiastolic flow reversal  present in the descending thoracic  aorta. There is some restriction to valve opening due to calcification,  but suspect significant AI contributes to the mean gradient. Would  consider assessment by TEE. The aortic valve has been repaired/replaced.  Aortic valve regurgitation is moderate.  Aortic regurgitation PHT measures 390 msec. Aortic valve mean gradient  measures 26.6 mmHg. Aortic valve peak gradient measures 49.1 mmHg. Aortic  valve area, by VTI  measures 0.90 cm.   Pulmonic Valve: The pulmonic valve was normal in structure. Pulmonic valve  regurgitation is trivial.   Aorta: Aortic dilatation noted. There is mild dilatation of the aortic  root, measuring 40 mm.   Venous: The inferior vena cava is normal in size with greater than 50%  respiratory variability, suggesting right atrial pressure of 3 mmHg.   IAS/Shunts: No atrial level shunt detected by color flow Doppler.      LEFT VENTRICLE  PLAX 2D  LVIDd:         4.80 cm  Diastology  LVIDs:         3.30 cm  LV e' medial:    4.03 cm/s  LV PW:         1.40 cm  LV E/e' medial:  19.7  LV IVS:        1.70 cm  LV e' lateral:   9.28 cm/s  LVOT diam:     2.10 cm  LV E/e' lateral: 8.5  LV SV:         92  LV SV Index:   61  LVOT Area:     3.46 cm      RIGHT VENTRICLE            IVC  RV S prime:     8.92 cm/s  IVC diam: 1.90 cm  TAPSE (M-mode): 1.4 cm  RVSP:           35.9 mmHg   LEFT ATRIUM             Index       RIGHT ATRIUM           Index  LA diam:        3.50 cm 2.33 cm/m  RA Pressure: 3.00 mmHg  LA Vol (A2C):   62.2 ml 41.36 ml/m RA Area:     15.80 cm  LA Vol (A4C):   56.6 ml 37.64 ml/m RA Volume:   41.30 ml  27.46 ml/m  LA Biplane Vol: 59.4 ml 39.50 ml/m   AORTIC VALVE  AV Area (Vmax):    0.87 cm  AV Area (Vmean):  0.91 cm  AV Area (VTI):     0.90 cm  AV Vmax:           350.20 cm/s  AV Vmean:          239.000 cm/s  AV VTI:            1.020 m  AV Peak Grad:      49.1 mmHg  AV Mean Grad:      26.6 mmHg  LVOT Vmax:         87.46 cm/s  LVOT Vmean:        62.860 cm/s  LVOT VTI:          0.264 m  LVOT/AV VTI ratio: 0.26  AI PHT:            390 msec     AORTA  Ao Root diam: 4.00 cm  Ao Asc diam:  3.10 cm   MITRAL VALVE               TRICUSPID VALVE  MV Area (PHT): 4.32 cm    TR Peak grad:   32.9 mmHg  MV Decel Time: 176 msec    TR Vmax:        287.00 cm/s  MV E velocity: 79.34 cm/s  Estimated RAP:  3.00 mmHg  MV A velocity: 48.76 cm/s  RVSP:            35.9 mmHg  MV E/A ratio:  1.63                             SHUNTS                             Systemic VTI:  0.26 m                             Systemic Diam: 2.10 cm   Transesophageal Echo 03/27/21:    1. Left ventricular ejection fraction, by estimation, is 60 to 65%. The  left ventricle has normal function. The left ventricle has no regional  wall motion abnormalities. Left ventricular diastolic parameters are  consistent with Grade II diastolic  dysfunction (pseudonormalization). Elevated left atrial pressure.   2. Right ventricular systolic function is mildly reduced. The right  ventricular size is normal. There is moderately elevated pulmonary artery  systolic pressure.   3. Left atrial size was mildly dilated. No left atrial/left atrial  appendage thrombus was detected.   4. Right atrial size was mildly dilated.   5. The mitral valve is normal in structure. Mild to moderate mitral valve  regurgitation.   6. The aortic valve has been repaired/replaced. There is moderate  calcification of the aortic valve. There is moderate thickening of the  aortic valve. Aortic valve regurgitation is moderate to severe and is  intraanular (there is no perivalvular leak).  Mild aortic valve stenosis. There is a 21 mm Edwards bioprosthetic valve  present in the aortic position. Aortic regurgitation PHT measures 336  msec. Aortic valve mean gradient measures 13.4 mmHg. Aortic valve Vmax  measures 2.57 m/s. Aortic valve  acceleration time measures 114 msec.   FINDINGS   Left Ventricle: Left ventricular ejection fraction, by estimation, is 60  to 65%. The left ventricle has normal function. The left ventricle has no  regional wall motion abnormalities. The left ventricular internal cavity  size was normal in size. There is   no left ventricular hypertrophy. Abnormal (paradoxical) septal motion  consistent with post-operative status. Left ventricular diastolic  parameters are consistent with  Grade II diastolic dysfunction  (pseudonormalization).   Right Ventricle: The right ventricular size is normal. No increase in  right ventricular wall thickness. Right ventricular systolic function is  mildly reduced. There is moderately elevated pulmonary artery systolic  pressure. The tricuspid regurgitant  velocity is 3.81 m/s, and with an assumed right atrial pressure of 5 mmHg,  the estimated right ventricular systolic pressure is 37.1 mmHg.   Left Atrium: Left atrial size was mildly dilated. No left atrial/left  atrial appendage thrombus was detected.   Right Atrium: Right atrial size was mildly dilated.   Pericardium: There is no evidence of pericardial effusion.   Mitral Valve: The mitral valve is normal in structure. Mild mitral annular  calcification. Mild to moderate mitral valve regurgitation, with  centrally-directed jet.   Tricuspid Valve: The tricuspid valve is normal in structure. Tricuspid  valve regurgitation is mild.   Aortic Valve: Unable to get an adequate measurement of the LVOT VTI  profile. The aortic valve has been repaired/replaced. There is moderate  calcification of the aortic valve. There is moderate thickening of the  aortic valve. Aortic valve regurgitation is   moderate to severe. Aortic regurgitation PHT measures 336 msec. Mild  aortic stenosis is present. Aortic valve mean gradient measures 13.4 mmHg.  Aortic valve peak gradient measures 26.4 mmHg. There is a 21 mm Edwards  Perimount [id number] bioprosthetic valve  present in the aortic position.   Pulmonic Valve: The pulmonic valve was normal in structure. Pulmonic valve  regurgitation is trivial.   Aorta: The aortic root, ascending aorta and aortic arch are all  structurally normal, with no evidence of dilitation or obstruction. There  is minimal (Grade I) layered plaque.   IAS/Shunts: No atrial level shunt detected by color flow Doppler.      AORTIC VALVE  AV Vmax:      257.02 cm/s  AV  Vmean:     169.276 cm/s  AV VTI:       0.711 m  AV Peak Grad: 26.4 mmHg  AV Mean Grad: 13.4 mmHg  AI PHT:       336 msec   MITRAL VALVE               TRICUSPID VALVE  MV Area (PHT): 3.70 cm    TR Peak grad:   58.1 mmHg  MV Decel Time: 205 msec    TR Vmax:        381.05 cm/s  MV E velocity: 86.80 cm/s  MV A velocity: 60.90 cm/s  MV E/A ratio:  1.43   Recent Labs: 12/20/2020: ALT 13; TSH 2.414 03/24/2021: B Natriuretic Peptide 2,655.4; Hemoglobin 10.1; Platelets 211 03/25/2021: BUN 21; Creatinine, Ser 2.01; Potassium 4.3; Sodium 137   Wt Readings from Last 3 Encounters:  04/04/21 112 lb 6.4 oz (51 kg)  03/30/21 116 lb 9.6 oz (52.9 kg)  03/27/21 116 lb 6.5 oz (52.8 kg)     Other studies Reviewed: Additional studies/ records that were reviewed today include: echo images, TEE images, office notes, EKG Review of the above records demonstrates: bioprosthetic AVR failure   STS Risk Score: Risk of Mortality: 7.314% Renal Failure: 4.441% Permanent Stroke: 2.503% Prolonged Ventilation: 19.791% DSW Infection: 0.077% Reoperation: 6.517% Morbidity or Mortality: 29.474% Short Length of Stay: 16.298% Long Length of Stay: 14.364%  Assessment  and Plan:   1. Severe Aortic Valve Stenosis: She has failure of her bioprosthetic AVR with severe AI and mild to moderate aortic stenosis. I have personally reviewed the echo images. The aortic valve leaflets are thickened. I think she would benefit from AVR. Given advanced age and prior open chest surgery, she is not a good candidate for conventional AVR by surgical approach. I think she may be a good candidate for "valve in valve" TAVR.   I have reviewed the natural history of aortic stenosis with the patient and their family members  who are present today. We have discussed the limitations of medical therapy and the poor prognosis associated with symptomatic aortic stenosis. We have reviewed potential treatment options, including palliative  medical therapy, conventional surgical aortic valve replacement, and transcatheter aortic valve replacement. We discussed treatment options in the context of the patient's specific comorbid medical conditions.   She would like to proceed with planning for TAVR. I will arrange a right and left heart catheter[facility] at [facility] withDr. [name]. Risks and benefits of the cath procedure and the valve procedure are reviewed with the patient. After the cath, she  will have a cardiac CT, CTA of the chest/abdomen and pelvis, PT assessment and will then be referred to see Dr. Cyndia Bent.  She has had recent carotid artery dopplers.   BMET and CBC today. She will need to hold the Eliquis two days before her cardiac cath.      Current medicines are reviewed at length with the patient today.  The patient does not have concerns regarding medicines.  The following changes have been made:  no change  Labs/ tests ordered today include:   Orders Placed This Encounter  Procedures   CBC   Basic metabolic panel    Disposition:   F/U with the valve team.    Signed, Lauree Chandler, MD 04/04/2021 9:47 AM    [facility] [place], [place], [place]  [address] Phone: [phone]; Fax: [phone]

## 2021-04-04 ENCOUNTER — Ambulatory Visit (INDEPENDENT_AMBULATORY_CARE_PROVIDER_SITE_OTHER): Payer: Medicare Other | Admitting: Cardiovascular Disease

## 2021-04-04 ENCOUNTER — Encounter: Payer: Self-pay | Admitting: Cardiovascular Disease

## 2021-04-04 ENCOUNTER — Other Ambulatory Visit: Payer: Self-pay

## 2021-04-04 ENCOUNTER — Encounter: Payer: Self-pay | Admitting: *Deleted

## 2021-04-04 VITALS — BP 108/60 | HR 60 | Ht 61.0 in | Wt 112.4 lb

## 2021-04-04 DIAGNOSIS — I351 Nonrheumatic aortic (valve) insufficiency: Secondary | ICD-10-CM

## 2021-04-04 DIAGNOSIS — Z01812 Encounter for preprocedural laboratory examination: Secondary | ICD-10-CM

## 2021-04-04 LAB — CBC
Hematocrit: 32.3 % — ABNORMAL LOW (ref 34.0–46.6)
Hemoglobin: 10.6 g/dL — ABNORMAL LOW (ref 11.1–15.9)
MCH: 32 pg (ref 26.6–33.0)
MCHC: 32.8 g/dL (ref 31.5–35.7)
MCV: 98 fL — ABNORMAL HIGH (ref 79–97)
Platelets: 173 10*3/uL (ref 150–450)
RBC: 3.31 x10E6/uL — ABNORMAL LOW (ref 3.77–5.28)
RDW: 13.7 % (ref 11.7–15.4)
WBC: 5.7 10*3/uL (ref 3.4–10.8)

## 2021-04-04 LAB — BASIC METABOLIC PANEL
BUN/Creatinine Ratio: 16 (ref 12–28)
BUN: 40 mg/dL — ABNORMAL HIGH (ref 8–27)
CO2: 23 mmol/L (ref 20–29)
Calcium: 9.8 mg/dL (ref 8.7–10.3)
Chloride: 98 mmol/L (ref 96–106)
Creatinine, Ser: 2.43 mg/dL — ABNORMAL HIGH (ref 0.57–1.00)
Glucose: 87 mg/dL (ref 65–99)
Potassium: 3.8 mmol/L (ref 3.5–5.2)
Sodium: 139 mmol/L (ref 134–144)
eGFR: 20 mL/min/{1.73_m2} — ABNORMAL LOW (ref >59)

## 2021-04-04 NOTE — Patient Instructions (Signed)
Medication Instructions:  No changes *If you need a refill on your cardiac medications before your next appointment, please call your pharmacy*   Lab Work: none If you have labs (blood work) drawn today and your tests are completely normal, you will receive your results only by: [place] (if you have MyChart) OR A paper copy in the mail If you have any lab test that is abnormal or we need to change your treatment, we will call you to review the results.   Testing/Procedures: none   Follow-Up: Per Structural Heart Team  Other Instructions Dr. Kennon Holter office will contact you regarding setting up the heart catheterization.

## 2021-04-05 ENCOUNTER — Telehealth: Payer: Self-pay | Admitting: *Deleted

## 2021-04-05 DIAGNOSIS — Z8679 Personal history of other diseases of the circulatory system: Secondary | ICD-10-CM

## 2021-04-05 DIAGNOSIS — Z01812 Encounter for preprocedural laboratory examination: Secondary | ICD-10-CM

## 2021-04-05 NOTE — Telephone Encounter (Signed)
Pt returned my phone call. Adv of lab results and recommendations. She has taken torsemide today. Will hold from now until after her labs are reviewed (BMET in one week) Informed her procedure will likely be postponed for now-she is scheduled for 9/8.  Lab appointment placed.  Results to be cc'd to Dr. [name].

## 2021-04-05 NOTE — Telephone Encounter (Signed)
-----   Message from [name] Blanks, MD sent at 04/05/2021 10:21 AM EDT ----- Renal function slightly worse on pre-cath labs. I would have her hold her Torsemide. Encourage increased po intake. Should delay scheduling cath until her BMET has been rechecked in one week. Will copy Dr. [name] on this as well since he will be doing her cath. [name] [name]

## 2021-04-05 NOTE — Telephone Encounter (Addendum)
I spoke with patient, see previous note.

## 2021-04-05 NOTE — Telephone Encounter (Signed)
Patient returning call.

## 2021-04-05 NOTE — Telephone Encounter (Addendum)
Patient is aware that I would not be able to reschedule cath from 04/12/21 until 04/30/21 with Dr [name]. Patient reports that if Dr [name] was unavailable to do cath, I could schedule with Dr Angelena Form. After discussion with patient, plan will be to repeat BMP 04/12/21 and reschedule cath from 04/12/21 to 04/19/21 with Dr Angelena Form, plan pre-procedure hydration. Patient aware I will follow up with her in the morning and review details.

## 2021-04-06 NOTE — Telephone Encounter (Addendum)
Due to cath lab schedule availability,and after discussion with patient,  [facility] has been rescheduled to April 19, 2021 with Dr Angelena Form, arrive 5:30 AM to allow time for pre-procedure hydration prior to 10:30 AM procedure. Reviewed plan with patient to continue to hold torsemide, repeat BMP 04/12/21, follow up with patient once 04/12/21 BMP results have been reviewed.   I have reviewed cath instructions for 04/19/21 with patient:  Cardiac catheterization with Dr Angelena Form has been scheduled at [facility] for: Thursday April 19, 2021 10:30 AM [place] Entrance [facility]) at: 5:30 AM-pre-procedure hydration  Medication instructions: Hold: Eliquis-none 04/17/21 until post procedure Farxiga-AM of procedure Torsemide-on hold as of 04/05/21  Except hold medications morning medications can be taken pre-cath with sips of water including aspirin 81 mg.        I will forward to Dr Angelena Form and Dr [name] for their review.

## 2021-04-06 NOTE — Telephone Encounter (Deleted)
Cardiac catheterization scheduled at [facility] for: [facility] Main Entrance A [facility]) at:   No solid food after midnight prior to cath, clear liquids until 5 AM day of procedure. CONTRAST ALLERGY:  Morning medications can be taken pre-cath with sips of water including aspirin 81 mg.    Confirmed patient has responsible adult to drive home post procedure and be with patient first 24 hours after arriving home.  Patients are allowed one visitor in the waiting room during the time they are at the hospital for their procedure. Both patient and visitor must wear a mask once they enter the hospital.   Patient reports does not currently have any symptoms concerning for COVID-19 and no household members with COVID-19 like illness.

## 2021-04-12 ENCOUNTER — Other Ambulatory Visit: Payer: Medicare Other | Admitting: *Deleted

## 2021-04-12 ENCOUNTER — Other Ambulatory Visit: Payer: Self-pay

## 2021-04-12 ENCOUNTER — Other Ambulatory Visit: Payer: Medicare Other

## 2021-04-12 DIAGNOSIS — Z8679 Personal history of other diseases of the circulatory system: Secondary | ICD-10-CM

## 2021-04-12 DIAGNOSIS — Z01812 Encounter for preprocedural laboratory examination: Secondary | ICD-10-CM | POA: Diagnosis not present

## 2021-04-12 LAB — BASIC METABOLIC PANEL
BUN/Creatinine Ratio: 13 (ref 12–28)
BUN: 23 mg/dL (ref 8–27)
CO2: 22 mmol/L (ref 20–29)
Calcium: 9.6 mg/dL (ref 8.7–10.3)
Chloride: 105 mmol/L (ref 96–106)
Creatinine, Ser: 1.72 mg/dL — ABNORMAL HIGH (ref 0.57–1.00)
Glucose: 95 mg/dL (ref 65–99)
Potassium: 4.7 mmol/L (ref 3.5–5.2)
Sodium: 136 mmol/L (ref 134–144)
eGFR: 30 mL/min/{1.73_m2} — ABNORMAL LOW (ref >59)

## 2021-04-16 ENCOUNTER — Telehealth: Payer: Self-pay | Admitting: *Deleted

## 2021-04-16 NOTE — Telephone Encounter (Signed)
Cardiac catheterization scheduled at [facility] for: Thursday April 19, 2021 10:30 AM [facility] Main Entrance A [facility]) at: 5:30 AM -pre-procedure hydration   No solid food after midnight prior to cath, clear liquids until 5 AM day of procedure.  Medication instructions: Hold: -Eliquis-none 04/17/21 until post procedure -Farxiga-AM of procedure -Torsemide-none 04/17/21 until post procedure *  Except hold medications morning medications can be taken pre-cath with sips of water including aspirin 81 mg.    Confirmed patient has responsible adult to drive home post procedure and be with patient first 24 hours after arriving home.  Patients are allowed one visitor in the waiting room during the time they are at the hospital for their procedure. Both patient and visitor must wear a mask once they enter the hospital.   Patient reports does not currently have any symptoms concerning for COVID-19 and no household members with COVID-19 like illness.      Reviewed procedure/mask/visitor instructions with patient.                          Per Dr Angelena Form instructions torsemide on hold starting 04/05/21.                        Patient reports on Saturday 04/14/21 weight gain of 4 pounds and shortness of breath-she took torsemide 5 mg Sat 04/14/21, Sun 04/15/21, and Monday 04/16/21 (today).                        Patient reports weight down 2 pounds today and shortness of breath better.                        If symptoms continue to improve, patient will plan to hold torsemide starting tomorrow until after procedure 04/19/21.

## 2021-04-19 ENCOUNTER — Encounter (HOSPITAL_COMMUNITY)
Admission: RE | Disposition: A | Discharge status: Home / Self Care | Payer: Self-pay | Source: Home / Self Care | Attending: Cardiovascular Disease

## 2021-04-19 ENCOUNTER — Telehealth: Payer: Self-pay

## 2021-04-19 ENCOUNTER — Other Ambulatory Visit: Payer: Self-pay

## 2021-04-19 ENCOUNTER — Ambulatory Visit (HOSPITAL_COMMUNITY)
Admission: RE | Admit: 2021-04-19 | Discharge: 2021-04-19 | Disposition: A | Discharge status: Home / Self Care | Payer: Medicare Other | Attending: Cardiovascular Disease | Admitting: Cardiovascular Disease

## 2021-04-19 ENCOUNTER — Encounter (HOSPITAL_COMMUNITY): Payer: Self-pay | Admitting: Cardiovascular Disease

## 2021-04-19 DIAGNOSIS — Z953 Presence of xenogenic heart valve: Secondary | ICD-10-CM | POA: Diagnosis not present

## 2021-04-19 DIAGNOSIS — Z7901 Long term (current) use of anticoagulants: Secondary | ICD-10-CM | POA: Diagnosis not present

## 2021-04-19 DIAGNOSIS — I352 Nonrheumatic aortic (valve) stenosis with insufficiency: Secondary | ICD-10-CM | POA: Insufficient documentation

## 2021-04-19 DIAGNOSIS — K219 Gastro-esophageal reflux disease without esophagitis: Secondary | ICD-10-CM | POA: Insufficient documentation

## 2021-04-19 DIAGNOSIS — Z79899 Other long term (current) drug therapy: Secondary | ICD-10-CM | POA: Insufficient documentation

## 2021-04-19 DIAGNOSIS — I351 Nonrheumatic aortic (valve) insufficiency: Secondary | ICD-10-CM | POA: Diagnosis not present

## 2021-04-19 DIAGNOSIS — E785 Hyperlipidemia, unspecified: Secondary | ICD-10-CM | POA: Insufficient documentation

## 2021-04-19 DIAGNOSIS — Z8249 Family history of ischemic heart disease and other diseases of the circulatory system: Secondary | ICD-10-CM | POA: Diagnosis not present

## 2021-04-19 DIAGNOSIS — Z7984 Long term (current) use of oral hypoglycemic drugs: Secondary | ICD-10-CM | POA: Insufficient documentation

## 2021-04-19 DIAGNOSIS — Z885 Allergy status to narcotic agent status: Secondary | ICD-10-CM | POA: Diagnosis not present

## 2021-04-19 DIAGNOSIS — I480 Paroxysmal atrial fibrillation: Secondary | ICD-10-CM | POA: Insufficient documentation

## 2021-04-19 HISTORY — PX: RIGHT HEART CATH AND CORONARY ANGIOGRAPHY: CATH118264

## 2021-04-19 LAB — POCT I-STAT EG7
Acid-base deficit: 3 mmol/L — ABNORMAL HIGH (ref 0.0–2.0)
Acid-base deficit: 3 mmol/L — ABNORMAL HIGH (ref 0.0–2.0)
Bicarbonate: 24.5 mmol/L (ref 20.0–28.0)
Bicarbonate: 24.5 mmol/L (ref 20.0–28.0)
Calcium, Ion: 1.22 mmol/L (ref 1.15–1.40)
Calcium, Ion: 1.29 mmol/L (ref 1.15–1.40)
HCT: 27 % — ABNORMAL LOW (ref 36.0–46.0)
HCT: 28 % — ABNORMAL LOW (ref 36.0–46.0)
Hemoglobin: 9.2 g/dL — ABNORMAL LOW (ref 12.0–15.0)
Hemoglobin: 9.5 g/dL — ABNORMAL LOW (ref 12.0–15.0)
O2 Saturation: 61 %
O2 Saturation: 74 %
Potassium: 4.2 mmol/L (ref 3.5–5.1)
Potassium: 4.4 mmol/L (ref 3.5–5.1)
Sodium: 141 mmol/L (ref 135–145)
Sodium: 141 mmol/L (ref 135–145)
TCO2: 26 mmol/L (ref 22–32)
TCO2: 26 mmol/L (ref 22–32)
pCO2, Ven: 54.1 mmHg (ref 44.0–60.0)
pCO2, Ven: 56.1 mmHg (ref 44.0–60.0)
pH, Ven: 7.249 — ABNORMAL LOW (ref 7.250–7.430)
pH, Ven: 7.264 (ref 7.250–7.430)
pO2, Ven: 37 mmHg (ref 32.0–45.0)
pO2, Ven: 46 mmHg — ABNORMAL HIGH (ref 32.0–45.0)

## 2021-04-19 LAB — POCT I-STAT 7, (LYTES, BLD GAS, ICA,H+H)
Acid-base deficit: 4 mmol/L — ABNORMAL HIGH (ref 0.0–2.0)
Bicarbonate: 22.1 mmol/L (ref 20.0–28.0)
Calcium, Ion: 1.26 mmol/L (ref 1.15–1.40)
HCT: 27 % — ABNORMAL LOW (ref 36.0–46.0)
Hemoglobin: 9.2 g/dL — ABNORMAL LOW (ref 12.0–15.0)
O2 Saturation: 98 %
Potassium: 4.2 mmol/L (ref 3.5–5.1)
Sodium: 141 mmol/L (ref 135–145)
TCO2: 23 mmol/L (ref 22–32)
pCO2 arterial: 45.9 mmHg (ref 32.0–48.0)
pH, Arterial: 7.291 — ABNORMAL LOW (ref 7.350–7.450)
pO2, Arterial: 111 mmHg — ABNORMAL HIGH (ref 83.0–108.0)

## 2021-04-19 SURGERY — RIGHT HEART CATH AND CORONARY ANGIOGRAPHY
Anesthesia: LOCAL

## 2021-04-19 MED ORDER — SODIUM CHLORIDE 0.9 % WEIGHT BASED INFUSION: 1.0000 mL/kg/h | INTRAVENOUS | Status: DC

## 2021-04-19 MED ORDER — HEPARIN (PORCINE) IN NACL 1000-0.9 UT/500ML-% IV SOLN
INTRAVENOUS | Status: DC | PRN
  Administered 2021-04-19 (×2): 500 mL

## 2021-04-19 MED ORDER — SODIUM CHLORIDE 0.9% FLUSH: 3.0000 mL | Freq: Two times a day (BID) | INTRAVENOUS | Status: DC

## 2021-04-19 MED ORDER — ASPIRIN 81 MG PO CHEW: 81.0000 mg | CHEWABLE_TABLET | ORAL | Status: DC

## 2021-04-19 MED ORDER — HEPARIN SODIUM (PORCINE) 1000 UNIT/ML IJ SOLN
INTRAMUSCULAR | Status: AC
  Filled 2021-04-19: qty 1

## 2021-04-19 MED ORDER — HYDRALAZINE HCL 20 MG/ML IJ SOLN: 10.0000 mg | INTRAMUSCULAR | Status: DC | PRN

## 2021-04-19 MED ORDER — ONDANSETRON HCL 4 MG/2ML IJ SOLN: 4.0000 mg | Freq: Four times a day (QID) | INTRAMUSCULAR | Status: DC | PRN

## 2021-04-19 MED ORDER — VERAPAMIL HCL 2.5 MG/ML IV SOLN
INTRAVENOUS | Status: DC | PRN
  Administered 2021-04-19: 10 mL via INTRA_ARTERIAL

## 2021-04-19 MED ORDER — ACETAMINOPHEN 325 MG PO TABS: 650.0000 mg | ORAL_TABLET | ORAL | Status: DC | PRN

## 2021-04-19 MED ORDER — SODIUM CHLORIDE 0.9 % IV SOLN: 250.0000 mL | INTRAVENOUS | Status: DC | PRN

## 2021-04-19 MED ORDER — SODIUM CHLORIDE 0.9 % WEIGHT BASED INFUSION
3.0000 mL/kg/h | INTRAVENOUS | Status: AC
  Administered 2021-04-19: 3 mL/kg/h via INTRAVENOUS

## 2021-04-19 MED ORDER — LABETALOL HCL 5 MG/ML IV SOLN: 10.0000 mg | INTRAVENOUS | Status: DC | PRN

## 2021-04-19 MED ORDER — VERAPAMIL HCL 2.5 MG/ML IV SOLN
INTRAVENOUS | Status: AC
  Filled 2021-04-19: qty 2

## 2021-04-19 MED ORDER — MIDAZOLAM HCL 2 MG/2ML IJ SOLN
INTRAMUSCULAR | Status: DC | PRN
  Administered 2021-04-19: 1 mg via INTRAVENOUS

## 2021-04-19 MED ORDER — SODIUM CHLORIDE 0.9 % IV SOLN: INTRAVENOUS | Status: AC

## 2021-04-19 MED ORDER — HEPARIN (PORCINE) IN NACL 1000-0.9 UT/500ML-% IV SOLN
INTRAVENOUS | Status: AC
  Filled 2021-04-19: qty 1000

## 2021-04-19 MED ORDER — SODIUM CHLORIDE 0.9% FLUSH: 3.0000 mL | INTRAVENOUS | Status: DC | PRN

## 2021-04-19 MED ORDER — TORSEMIDE 10 MG PO TABS: 20.0000 mg | ORAL_TABLET | Freq: Every morning | ORAL | 1 refills | Status: DC

## 2021-04-19 MED ORDER — IOHEXOL 350 MG/ML SOLN
INTRAVENOUS | Status: DC | PRN
  Administered 2021-04-19: 70 mL

## 2021-04-19 MED ORDER — MIDAZOLAM HCL 2 MG/2ML IJ SOLN
INTRAMUSCULAR | Status: AC
  Filled 2021-04-19: qty 2

## 2021-04-19 MED ORDER — FENTANYL CITRATE (PF) 100 MCG/2ML IJ SOLN
INTRAMUSCULAR | Status: AC
  Filled 2021-04-19: qty 2

## 2021-04-19 MED ORDER — HEPARIN SODIUM (PORCINE) 1000 UNIT/ML IJ SOLN
INTRAMUSCULAR | Status: DC | PRN
  Administered 2021-04-19: 4000 [IU] via INTRAVENOUS

## 2021-04-19 MED ORDER — LIDOCAINE HCL (PF) 1 % IJ SOLN
INTRAMUSCULAR | Status: AC
  Filled 2021-04-19: qty 30

## 2021-04-19 MED ORDER — FENTANYL CITRATE (PF) 100 MCG/2ML IJ SOLN
INTRAMUSCULAR | Status: DC | PRN
  Administered 2021-04-19: 25 ug via INTRAVENOUS

## 2021-04-19 MED ORDER — LIDOCAINE HCL (PF) 1 % IJ SOLN
INTRAMUSCULAR | Status: DC | PRN
  Administered 2021-04-19 (×2): 2 mL

## 2021-04-19 SURGICAL SUPPLY — 14 items
CATH 5FR JL3.5 JR4 ANG PIG MP (CATHETERS) ×1 IMPLANT
CATH BALLN WEDGE 5F 110CM (CATHETERS) ×1 IMPLANT
CATH DIAG 6FR JL4 (CATHETERS) ×1 IMPLANT
DEVICE RAD TR BAND REGULAR (VASCULAR PRODUCTS) ×1 IMPLANT
GLIDESHEATH SLEND SS 6F .021 (SHEATH) ×2 IMPLANT
GUIDEWIRE .025 260CM (WIRE) ×1 IMPLANT
GUIDEWIRE INQWIRE 1.5J.035X260 (WIRE) IMPLANT
INQWIRE 1.5J .035X260CM (WIRE) ×2
KIT HEART LEFT (KITS) ×2 IMPLANT
PACK CARDIAC CATHETERIZATION (CUSTOM PROCEDURE TRAY) ×2 IMPLANT
SHEATH GLIDE SLENDER 4/5FR (SHEATH) IMPLANT
SHEATH PROBE COVER 6X72 (BAG) ×1 IMPLANT
TRANSDUCER W/STOPCOCK (MISCELLANEOUS) ×2 IMPLANT
TUBING CIL FLEX 10 FLL-RA (TUBING) ×2 IMPLANT

## 2021-04-19 NOTE — Telephone Encounter (Signed)
Per Dr. Angelena Form, instructed the patient to INCREASE TORSEMIDE to 20 mg daily and to repeat BMET next Thursday at [facility] in [place].

## 2021-04-19 NOTE — Interval H&P Note (Signed)
History and Physical Interval Note:  04/19/2021 9:79 AM  [name] [name]  has presented today for surgery, with the diagnosis of aortic stenosis.  The various methods of treatment have been discussed with the patient and family. After consideration of risks, benefits and other options for treatment, the patient has consented to  Procedure(s): RIGHT/LEFT HEART CATH AND CORONARY ANGIOGRAPHY (N/A) as a surgical intervention.  The patient's history has been reviewed, patient examined, no change in status, stable for surgery.  I have reviewed the patient's chart and labs.  Questions were answered to the patient's satisfaction.    Cath Lab Visit (complete for each Cath Lab visit)  Clinical Evaluation Leading to the Procedure:   ACS: No.  Non-ACS:    Anginal Classification: CCS I  Anti-ischemic medical therapy: Minimal Therapy (1 class of medications)  Non-Invasive Test Results: No non-invasive testing performed  Prior CABG: No previous CABG        Lauree Chandler

## 2021-04-19 NOTE — Discharge Instructions (Addendum)
Will increase Torsemide to 20 mg daily.  Resume Eliquis tomorrow if no bleeding from cath site.  . Drink plenty of fluid for 48 hours and keep wrist elevated at heart level for 24 hours  Radial Site Care   This sheet gives you information about how to care for yourself after your procedure. Your health care provider may also give you more specific instructions. If you have problems or questions, contact your health care provider. What can I expect after the procedure? After the procedure, it is common to have: Bruising and tenderness at the catheter insertion area. Follow these instructions at home: Medicines Take over-the-counter and prescription medicines only as told by your health care provider. Insertion site care Follow instructions from your health care provider about how to take care of your insertion site. Make sure you: Wash your hands with soap and water before you change your bandage (dressing). If soap and water are not available, use hand sanitizer. remove your dressing as told by your health care provider. In 24 hours Check your insertion site every day for signs of infection. Check for: Redness, swelling, or pain. Fluid or blood. Pus or a bad smell. Warmth. Do not take baths, swim, or use a hot tub until your health care provider approves. You may shower 24-48 hours after the procedure, or as directed by your health care provider. Remove the dressing and gently wash the site with plain soap and water. Pat the area dry with a clean towel. Do not rub the site. That could cause bleeding. Do not apply powder or lotion to the site. Activity   For 24 hours after the procedure, or as directed by your health care provider: Do not flex or bend the affected arm. Do not push or pull heavy objects with the affected arm. Do not drive yourself home from the hospital or clinic. You may drive 24 hours after the procedure unless your health care provider tells you not to. Do not  operate machinery or power tools. Do not lift anything that is heavier than 10 lb (4.5 kg), or the limit that you are told, until your health care provider says that it is safe. For 4 days Ask your health care provider when it is okay to: Return to work or school. Resume usual physical activities or sports. Resume sexual activity. General instructions If the catheter site starts to bleed, raise your arm and put firm pressure on the site. If the bleeding does not stop, get help right away. This is a medical emergency. If you went home on the same day as your procedure, a responsible adult should be with you for the first 24 hours after you arrive home. Keep all follow-up visits as told by your health care provider. This is important. Contact a health care provider if: You have a fever. You have redness, swelling, or yellow drainage around your insertion site. Get help right away if: You have unusual pain at the radial site. The catheter insertion area swells very fast. The insertion area is bleeding, and the bleeding does not stop when you hold steady pressure on the area. Your arm or hand becomes pale, cool, tingly, or numb. These symptoms may represent a serious problem that is an emergency. Do not wait to see if the symptoms will go away. Get medical help right away. Call your local emergency services (911 in the [place] Do not drive yourself to the hospital. Summary After the procedure, it is common to have bruising and  tenderness at the site. Follow instructions from your health care provider about how to take care of your radial site wound. Check the wound every day for signs of infection. Do not lift anything that is heavier than 10 lb (4.5 kg), or the limit that you are told, until your health care provider says that it is safe. This information is not intended to replace advice given to you by your health care provider. Make sure you discuss any questions you have with your health care  provider. Document Revised: 08/27/2017 Document Reviewed: 08/27/2017 Elsevier Patient Education  2020 [employer].

## 2021-04-19 NOTE — Progress Notes (Signed)
Patient and son was given discharge instructions. Both verbalized understanding. 

## 2021-04-25 ENCOUNTER — Telehealth: Payer: Self-pay | Admitting: Cardiovascular Disease

## 2021-04-25 NOTE — Telephone Encounter (Signed)
New Message:    Patient said she had a Cath last Thursday(9-15-22_. She noticed a knot on her right wrist where she had her Cath. She wants to know if this normal?

## 2021-04-25 NOTE — Telephone Encounter (Signed)
Pt reports a hard knot on wrist at cath site. Nickel sized. First noted Monday.  Tender to touch, there is bruising from hand to elbow, which has been present since Thursday.  Was told to expect bruising because there was blood under the skin and they had to put a lot of pressure on it.  No new bruising/discoloration noted.  No tingling, numbness or weakness. Skin temp equal to other arm.     I adv that it is to be expected sometimes to have a knot and to monitor for any new bleeding, or warmth or redness, swelling or weakness or if knot increases in size.  She will call back if any changes or any other concerns.

## 2021-04-26 DIAGNOSIS — I351 Nonrheumatic aortic (valve) insufficiency: Secondary | ICD-10-CM | POA: Diagnosis not present

## 2021-04-27 LAB — BASIC METABOLIC PANEL
BUN/Creatinine Ratio: 11 — ABNORMAL LOW (ref 12–28)
BUN: 24 mg/dL (ref 8–27)
CO2: 25 mmol/L (ref 20–29)
Calcium: 9.8 mg/dL (ref 8.7–10.3)
Chloride: 96 mmol/L (ref 96–106)
Creatinine, Ser: 2.17 mg/dL — ABNORMAL HIGH (ref 0.57–1.00)
Glucose: 103 mg/dL — ABNORMAL HIGH (ref 65–99)
Potassium: 4.2 mmol/L (ref 3.5–5.2)
Sodium: 137 mmol/L (ref 134–144)
eGFR: 22 mL/min/{1.73_m2} — ABNORMAL LOW (ref >59)

## 2021-05-02 DIAGNOSIS — H04123 Dry eye syndrome of bilateral lacrimal glands: Secondary | ICD-10-CM | POA: Diagnosis not present

## 2021-05-02 DIAGNOSIS — H469 Unspecified optic neuritis: Secondary | ICD-10-CM | POA: Diagnosis not present

## 2021-05-02 NOTE — Progress Notes (Signed)
SHe has an apt with [facility] 9/29

## 2021-05-03 ENCOUNTER — Encounter: Payer: Self-pay | Admitting: Cardiovascular Disease

## 2021-05-03 DIAGNOSIS — D631 Anemia in chronic kidney disease: Secondary | ICD-10-CM | POA: Diagnosis not present

## 2021-05-03 DIAGNOSIS — E875 Hyperkalemia: Secondary | ICD-10-CM | POA: Diagnosis not present

## 2021-05-03 DIAGNOSIS — I350 Nonrheumatic aortic (valve) stenosis: Secondary | ICD-10-CM | POA: Diagnosis not present

## 2021-05-03 DIAGNOSIS — N184 Chronic kidney disease, stage 4 (severe): Secondary | ICD-10-CM | POA: Diagnosis not present

## 2021-05-03 DIAGNOSIS — N2581 Secondary hyperparathyroidism of renal origin: Secondary | ICD-10-CM | POA: Diagnosis not present

## 2021-05-03 DIAGNOSIS — I129 Hypertensive chronic kidney disease with stage 1 through stage 4 chronic kidney disease, or unspecified chronic kidney disease: Secondary | ICD-10-CM | POA: Diagnosis not present

## 2021-05-09 ENCOUNTER — Telehealth: Payer: Self-pay | Admitting: Cardiovascular Disease

## 2021-05-09 DIAGNOSIS — I351 Nonrheumatic aortic (valve) insufficiency: Secondary | ICD-10-CM

## 2021-05-09 NOTE — Telephone Encounter (Signed)
Patient wanted to talk to Dr. Camillia Herter Nurse about having the heart surgery discussed at her appointment 04/04/21.

## 2021-05-09 NOTE — Telephone Encounter (Signed)
Reiterated to the patient Nephrology records have not been received.  Will call again in the morning and request records. After clearance is obtained, will arrange further TAVR work up and call patient. She was grateful for call and agrees with plan.

## 2021-05-10 NOTE — Telephone Encounter (Signed)
Records requested and received. Per Dr. Carolin Sicks, [place] to proceed with TAVR. TAVR scans ordered and will arrange with hydration and call patient.

## 2021-05-14 NOTE — Telephone Encounter (Signed)
Error

## 2021-05-15 ENCOUNTER — Other Ambulatory Visit: Payer: Self-pay

## 2021-05-15 DIAGNOSIS — N289 Disorder of kidney and ureter, unspecified: Secondary | ICD-10-CM

## 2021-05-15 DIAGNOSIS — I350 Nonrheumatic aortic (valve) stenosis: Secondary | ICD-10-CM

## 2021-05-15 NOTE — Telephone Encounter (Signed)
Arranged CT scans, PT appointment and appointment with Dr. Cyndia Bent. Reviewed all instructions and appointments with patient. She understands a letter has been released. She will review and call back if she has questions. She was grateful for call and agrees with plan.

## 2021-05-16 DIAGNOSIS — N184 Chronic kidney disease, stage 4 (severe): Secondary | ICD-10-CM | POA: Diagnosis not present

## 2021-05-16 DIAGNOSIS — E785 Hyperlipidemia, unspecified: Secondary | ICD-10-CM | POA: Diagnosis not present

## 2021-05-16 DIAGNOSIS — I4891 Unspecified atrial fibrillation: Secondary | ICD-10-CM | POA: Diagnosis not present

## 2021-05-16 DIAGNOSIS — I350 Nonrheumatic aortic (valve) stenosis: Secondary | ICD-10-CM | POA: Diagnosis not present

## 2021-05-16 DIAGNOSIS — Z79899 Other long term (current) drug therapy: Secondary | ICD-10-CM | POA: Diagnosis not present

## 2021-05-16 DIAGNOSIS — I272 Pulmonary hypertension, unspecified: Secondary | ICD-10-CM | POA: Diagnosis not present

## 2021-05-16 DIAGNOSIS — Z23 Encounter for immunization: Secondary | ICD-10-CM | POA: Diagnosis not present

## 2021-05-16 DIAGNOSIS — Z0000 Encounter for general adult medical examination without abnormal findings: Secondary | ICD-10-CM | POA: Diagnosis not present

## 2021-05-25 ENCOUNTER — Ambulatory Visit (HOSPITAL_COMMUNITY)
Admission: RE | Admit: 2021-05-25 | Discharge: 2021-05-25 | Disposition: A | Discharge status: Home / Self Care | Payer: Medicare Other | Source: Ambulatory Visit | Attending: Cardiovascular Disease | Admitting: Cardiovascular Disease

## 2021-05-25 DIAGNOSIS — N289 Disorder of kidney and ureter, unspecified: Secondary | ICD-10-CM

## 2021-05-25 DIAGNOSIS — I351 Nonrheumatic aortic (valve) insufficiency: Secondary | ICD-10-CM | POA: Insufficient documentation

## 2021-05-25 DIAGNOSIS — I350 Nonrheumatic aortic (valve) stenosis: Secondary | ICD-10-CM

## 2021-05-25 DIAGNOSIS — K5730 Diverticulosis of large intestine without perforation or abscess without bleeding: Secondary | ICD-10-CM | POA: Diagnosis not present

## 2021-05-25 LAB — BASIC METABOLIC PANEL
Anion gap: 11 (ref 5–15)
BUN: 33 mg/dL — ABNORMAL HIGH (ref 8–23)
CO2: 26 mmol/L (ref 22–32)
Calcium: 9.5 mg/dL (ref 8.9–10.3)
Chloride: 100 mmol/L (ref 98–111)
Creatinine, Ser: 2.09 mg/dL — ABNORMAL HIGH (ref 0.44–1.00)
GFR, Estimated: 23 mL/min — ABNORMAL LOW (ref >60)
Glucose, Bld: 104 mg/dL — ABNORMAL HIGH (ref 70–99)
Potassium: 3.5 mmol/L (ref 3.5–5.1)
Sodium: 137 mmol/L (ref 135–145)

## 2021-05-25 MED ORDER — SODIUM CHLORIDE 0.9 % WEIGHT BASED INFUSION: 1.0000 mL/kg/h | INTRAVENOUS | Status: DC

## 2021-05-25 MED ORDER — IOHEXOL 350 MG/ML SOLN
100.0000 mL | Freq: Once | INTRAVENOUS | Status: AC | PRN
  Administered 2021-05-25: 100 mL via INTRAVENOUS

## 2021-05-25 MED ORDER — SODIUM CHLORIDE 0.9 % WEIGHT BASED INFUSION
3.0000 mL/kg/h | INTRAVENOUS | Status: AC
  Administered 2021-05-25: 3 mL/kg/h via INTRAVENOUS

## 2021-05-25 NOTE — Progress Notes (Signed)
Called CT and reported to [place] ms [place] would be ready for CT at [phone]

## 2021-05-28 DIAGNOSIS — B354 Tinea corporis: Secondary | ICD-10-CM | POA: Diagnosis not present

## 2021-05-29 DIAGNOSIS — B354 Tinea corporis: Secondary | ICD-10-CM | POA: Diagnosis not present

## 2021-05-29 DIAGNOSIS — Z79899 Other long term (current) drug therapy: Secondary | ICD-10-CM | POA: Diagnosis not present

## 2021-06-13 ENCOUNTER — Other Ambulatory Visit: Payer: Self-pay

## 2021-06-13 ENCOUNTER — Institutional Professional Consult (permissible substitution) (INDEPENDENT_AMBULATORY_CARE_PROVIDER_SITE_OTHER): Payer: Medicare Other | Admitting: Surgery

## 2021-06-13 ENCOUNTER — Encounter: Payer: Self-pay | Admitting: Surgery

## 2021-06-13 VITALS — BP 104/44 | HR 55 | Resp 20 | Ht 61.0 in

## 2021-06-13 DIAGNOSIS — I350 Nonrheumatic aortic (valve) stenosis: Secondary | ICD-10-CM

## 2021-06-13 DIAGNOSIS — I351 Nonrheumatic aortic (valve) insufficiency: Secondary | ICD-10-CM

## 2021-06-13 DIAGNOSIS — R0609 Other forms of dyspnea: Secondary | ICD-10-CM

## 2021-06-13 DIAGNOSIS — R600 Localized edema: Secondary | ICD-10-CM

## 2021-06-13 DIAGNOSIS — R0789 Other chest pain: Secondary | ICD-10-CM

## 2021-06-13 NOTE — Progress Notes (Signed)
Pre Surgical Assessment: 5 M Walk Test  42M=16.56ft  5 Meter Walk Test- trial 1: 5.84 seconds 5 Meter Walk Test- trial 2: 8.84 seconds 5 Meter Walk Test- trial 3: 8.01 seconds 5 Meter Walk Test Average: 7.56 seconds

## 2021-06-14 NOTE — Progress Notes (Signed)
Surgical Instructions    Your procedure is scheduled on Tuesday, November 15th.  Report to [facility] Main Entrance "A" at 11:45 A.M., then check in with the Admitting office.  Call this number if you have problems the morning of surgery:  [phone]   If you have any questions prior to your surgery date call [phone]: Open Monday-Friday 8am-4pm    Remember:  Do not eat or drink after midnight the night before your surgery     Take these medicines the morning of surgery with A SIP OF WATER NONE  If needed: Albuterol inhaler - bring with you on day of surgery   Follow your surgeon's instructions on when to stop Eliquis.  Stop Eliquis on 06/14/21.  Your last dose should be taken on 06/13/21.  As of today, STOP taking any Aspirin (unless otherwise instructed by your surgeon) Aleve, Naproxen, Ibuprofen, Motrin, Advil, Goody's, BC's, all herbal medications, fish oil, and all vitamins.   DAY OF SURGERY         Do not wear jewelry, makeup, or nail polish Do not wear lotions, powders, perfumes, or deodorant. Do not shave 48 hours prior to surgery.   Do not bring valuables to the hospital.             [facility] is not responsible for any belongings or valuables.  Do NOT Smoke (Tobacco/Vaping)  24 hours prior to your procedure  If you use a CPAP at night, you may bring your mask for your overnight stay.   Contacts, glasses, hearing aids, dentures or partials may not be worn into surgery, please bring cases for these belongings   For patients admitted to the hospital, discharge time will be determined by your treatment team.   Patients discharged the day of surgery will not be allowed to drive home, and someone needs to stay with them for 24 hours.  NO VISITORS WILL BE ALLOWED IN PRE-OP WHERE PATIENTS ARE PREPPED FOR SURGERY.  ONLY 1 SUPPORT PERSON MAY BE PRESENT IN THE WAITING ROOM WHILE YOU ARE IN SURGERY.  IF YOU ARE TO BE ADMITTED, ONCE YOU ARE IN YOUR ROOM YOU WILL BE  ALLOWED TWO (2) VISITORS. 1 (ONE) VISITOR MAY STAY OVERNIGHT BUT MUST ARRIVE TO THE ROOM BY 8pm.  Minor children may have two parents present. Special consideration for safety and communication needs will be reviewed on a case by case basis.  Special instructions:    Oral Hygiene is also important to reduce your risk of infection.  Remember - BRUSH YOUR TEETH THE MORNING OF SURGERY WITH YOUR REGULAR TOOTHPASTE   [facility]- Preparing For Surgery  Before surgery, you can play an important role. Because skin is not sterile, your skin needs to be as free of germs as possible. You can reduce the number of germs on your skin by washing with CHG (chlorahexidine gluconate) Soap before surgery.  CHG is an antiseptic cleaner which kills germs and bonds with the skin to continue killing germs even after washing.     Please do not use if you have an allergy to CHG or antibacterial soaps. If your skin becomes reddened/irritated stop using the CHG.  Do not shave (including legs and underarms) for at least 48 hours prior to first CHG shower. It is OK to shave your face.  Please follow these instructions carefully.     Shower the NIGHT BEFORE SURGERY and the MORNING OF SURGERY with CHG Soap.   If you chose to wash your hair,  wash your hair first as usual with your normal shampoo. After you shampoo, rinse your hair and body thoroughly to remove the shampoo.  Then [employer] and genitals (private parts) with your normal soap and rinse thoroughly to remove soap.  After that Use CHG Soap as you would any other liquid soap. You can apply CHG directly to the skin and wash gently with a scrungie or a clean washcloth.   Apply the CHG Soap to your body ONLY FROM THE NECK DOWN.  Do not use on open wounds or open sores. Avoid contact with your eyes, ears, mouth and genitals (private parts). Wash Face and genitals (private parts)  with your normal soap.   Wash thoroughly, paying special attention to the area where  your surgery will be performed.  Thoroughly rinse your body with warm water from the neck down.  DO NOT shower/wash with your normal soap after using and rinsing off the CHG Soap.  Pat yourself dry with a CLEAN TOWEL.  Wear CLEAN PAJAMAS to bed the night before surgery  Place CLEAN SHEETS on your bed the night before your surgery  DO NOT SLEEP WITH PETS.   Day of Surgery:  Take a shower with CHG soap. Wear Clean/Comfortable clothing the morning of surgery Do not apply any deodorants/lotions.   Remember to brush your teeth WITH YOUR REGULAR TOOTHPASTE.   Please read over the following fact sheets that you were given.

## 2021-06-15 ENCOUNTER — Other Ambulatory Visit: Payer: Self-pay

## 2021-06-15 ENCOUNTER — Encounter (HOSPITAL_COMMUNITY)
Admission: RE | Admit: 2021-06-15 | Discharge: 2021-06-15 | Disposition: A | Discharge status: Home / Self Care | Payer: Medicare Other | Source: Ambulatory Visit | Attending: Cardiovascular Disease | Admitting: Cardiovascular Disease

## 2021-06-15 ENCOUNTER — Encounter (HOSPITAL_COMMUNITY): Payer: Self-pay

## 2021-06-15 ENCOUNTER — Ambulatory Visit (HOSPITAL_COMMUNITY)
Admission: RE | Admit: 2021-06-15 | Discharge: 2021-06-15 | Disposition: A | Discharge status: Home / Self Care | Payer: Medicare Other | Source: Ambulatory Visit | Attending: Cardiovascular Disease | Admitting: Cardiovascular Disease

## 2021-06-15 ENCOUNTER — Encounter: Payer: Self-pay | Admitting: Surgery

## 2021-06-15 VITALS — BP 118/55 | HR 55 | Temp 97.9°F | Resp 18 | Ht 61.0 in | Wt 109.9 lb

## 2021-06-15 DIAGNOSIS — R600 Localized edema: Secondary | ICD-10-CM | POA: Insufficient documentation

## 2021-06-15 DIAGNOSIS — K219 Gastro-esophageal reflux disease without esophagitis: Secondary | ICD-10-CM | POA: Insufficient documentation

## 2021-06-15 DIAGNOSIS — I4892 Unspecified atrial flutter: Secondary | ICD-10-CM | POA: Insufficient documentation

## 2021-06-15 DIAGNOSIS — I351 Nonrheumatic aortic (valve) insufficiency: Secondary | ICD-10-CM | POA: Insufficient documentation

## 2021-06-15 DIAGNOSIS — Z01818 Encounter for other preprocedural examination: Secondary | ICD-10-CM | POA: Diagnosis not present

## 2021-06-15 DIAGNOSIS — N189 Chronic kidney disease, unspecified: Secondary | ICD-10-CM | POA: Diagnosis not present

## 2021-06-15 DIAGNOSIS — I350 Nonrheumatic aortic (valve) stenosis: Secondary | ICD-10-CM | POA: Diagnosis not present

## 2021-06-15 DIAGNOSIS — Z20822 Contact with and (suspected) exposure to covid-19: Secondary | ICD-10-CM | POA: Diagnosis not present

## 2021-06-15 DIAGNOSIS — R0789 Other chest pain: Secondary | ICD-10-CM | POA: Diagnosis not present

## 2021-06-15 DIAGNOSIS — Z953 Presence of xenogenic heart valve: Secondary | ICD-10-CM | POA: Insufficient documentation

## 2021-06-15 DIAGNOSIS — R0609 Other forms of dyspnea: Secondary | ICD-10-CM | POA: Diagnosis not present

## 2021-06-15 DIAGNOSIS — I517 Cardiomegaly: Secondary | ICD-10-CM | POA: Insufficient documentation

## 2021-06-15 DIAGNOSIS — D649 Anemia, unspecified: Secondary | ICD-10-CM | POA: Insufficient documentation

## 2021-06-15 DIAGNOSIS — E785 Hyperlipidemia, unspecified: Secondary | ICD-10-CM | POA: Insufficient documentation

## 2021-06-15 HISTORY — DX: Chronic kidney disease, unspecified: N18.9

## 2021-06-15 HISTORY — DX: Anemia, unspecified: D64.9

## 2021-06-15 HISTORY — DX: Dyspnea, unspecified: R06.00

## 2021-06-15 LAB — CBC
HCT: 32.1 % — ABNORMAL LOW (ref 36.0–46.0)
Hemoglobin: 10 g/dL — ABNORMAL LOW (ref 12.0–15.0)
MCH: 31 pg (ref 26.0–34.0)
MCHC: 31.2 g/dL (ref 30.0–36.0)
MCV: 99.4 fL (ref 80.0–100.0)
Platelets: 203 10*3/uL (ref 150–400)
RBC: 3.23 MIL/uL — ABNORMAL LOW (ref 3.87–5.11)
RDW: 14.9 % (ref 11.5–15.5)
WBC: 5.3 10*3/uL (ref 4.0–10.5)
nRBC: 0 % (ref 0.0–0.2)

## 2021-06-15 LAB — COMPREHENSIVE METABOLIC PANEL
ALT: 20 U/L (ref 0–44)
AST: 29 U/L (ref 15–41)
Albumin: 3.8 g/dL (ref 3.5–5.0)
Alkaline Phosphatase: 67 U/L (ref 38–126)
Anion gap: 11 (ref 5–15)
BUN: 29 mg/dL — ABNORMAL HIGH (ref 8–23)
CO2: 22 mmol/L (ref 22–32)
Calcium: 9.1 mg/dL (ref 8.9–10.3)
Chloride: 101 mmol/L (ref 98–111)
Creatinine, Ser: 2.02 mg/dL — ABNORMAL HIGH (ref 0.44–1.00)
GFR, Estimated: 24 mL/min — ABNORMAL LOW (ref >60)
Glucose, Bld: 95 mg/dL (ref 70–99)
Potassium: 4.2 mmol/L (ref 3.5–5.1)
Sodium: 134 mmol/L — ABNORMAL LOW (ref 135–145)
Total Bilirubin: 0.9 mg/dL (ref 0.3–1.2)
Total Protein: 6.8 g/dL (ref 6.5–8.1)

## 2021-06-15 LAB — BLOOD GAS, ARTERIAL
Acid-Base Excess: 0.9 mmol/L (ref 0.0–2.0)
Bicarbonate: 24.7 mmol/L (ref 20.0–28.0)
Drawn by: 602861
FIO2: 21
O2 Saturation: 96.1 %
Patient temperature: 37
pCO2 arterial: 36.9 mmHg (ref 32.0–48.0)
pH, Arterial: 7.44 (ref 7.350–7.450)
pO2, Arterial: 77.6 mmHg — ABNORMAL LOW (ref 83.0–108.0)

## 2021-06-15 LAB — TYPE AND SCREEN
ABO/RH(D): B NEG
Antibody Screen: NEGATIVE

## 2021-06-15 LAB — URINALYSIS, ROUTINE W REFLEX MICROSCOPIC
Bilirubin Urine: NEGATIVE
Glucose, UA: 50 mg/dL — AB
Hgb urine dipstick: NEGATIVE
Ketones, ur: NEGATIVE mg/dL
Leukocytes,Ua: NEGATIVE
Nitrite: NEGATIVE
Protein, ur: NEGATIVE mg/dL
Specific Gravity, Urine: 1.004 — ABNORMAL LOW (ref 1.005–1.030)
pH: 6 (ref 5.0–8.0)

## 2021-06-15 LAB — SARS CORONAVIRUS 2 (TAT 6-24 HRS): SARS Coronavirus 2: NEGATIVE

## 2021-06-15 LAB — SURGICAL PCR SCREEN
MRSA, PCR: NEGATIVE
Staphylococcus aureus: NEGATIVE

## 2021-06-15 LAB — PROTIME-INR
INR: 1.1 (ref 0.8–1.2)
Prothrombin Time: 14.2 seconds (ref 11.4–15.2)

## 2021-06-15 LAB — BRAIN NATRIURETIC PEPTIDE: B Natriuretic Peptide: 2830.7 pg/mL — ABNORMAL HIGH (ref 0.0–100.0)

## 2021-06-15 NOTE — Progress Notes (Signed)
Patient ID: [name] [name], female   DOB: Dec 21, 1940, 80 y.o.   MRN: [id number]  [facility]         [address]       [place] [address]             [phone]          CARDIOTHORACIC SURGERY CONSULTATION REPORT  PCP is [name], MD Referring Provider is Lauree Chandler, MD Primary Cardiologist is Quay Burow, MD  Reason for consultation: Severe prosthetic valve aortic stenosis  HPI:   The patient is an 80 year old woman with a history of paroxysmal atrial fibrillation on Eliquis, stage III chronic kidney disease, thoracic aortic aneurysm, status post biological Bentall procedure in 2004 by Dr. Darcey Nora who was referred for consideration of valve in valve TAVR.  She has a 21 mm Edwards Perimount 7200 bioprosthetic valve.  She had an echocardiogram on 03/06/2021 that showed the bioprosthetic aortic valve to be heavily calcified.  The mean gradient was 28 mmHg with at least moderate eccentric aortic insufficiency.  Aortic root was noted to be 40 mm.  She underwent TEE on 03/27/2021 for further evaluation of the valve which showed moderate to severe insufficiency with no paravalvular leak.  The mean gradient was 13.4 mmHg with an AI pressure half-time of 336 ms.  Left ventricular ejection fraction was 60 to 65%.  There is grade 2 diastolic dysfunction.  There was mild to moderate mitral regurgitation.  She is here today with her son she reports progressive exertional chest tightness and shortness of breath as well as fatigue and lower extremity edema.  She has felt better since starting on torsemide.  She lives alone in [place] and takes care of her self.  She has a son in [place] who is here today.  She sees a dentist regularly and has no active dental issues.  Past Medical History:  Diagnosis Date   Anemia    Aortic root dilatation ([place]) 05/11/2012   CTA CHEST   Arthritis    Cardiac asystole,  post DCCV requiring use of temp. external pacemaker leads 07/19/2012   Chronic kidney disease    Stage 4   Dyspnea    Dysrhythmia    GERD (gastroesophageal reflux disease)    H/O aortic valve disorder 10/04/2002   Dr. Tharon Aquas Trigt   Headache(784.0)    Heart murmur    History of migraine headaches    Hyperlipidemia    on statin therapy   Migraines    Thoracic ascending aortic aneurysm    CTA CHEST 05/11/12    Past Surgical History:  Procedure Laterality Date   ABDOMINAL HYSTERECTOMY     AORTIC VALVE REPLACEMENT  10/04/2002    Dr. 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   LEFT AND RIGHT HEART CATHETERIZATION WITH CORONARY ANGIOGRAM N/A 06/18/2012   Procedure: LEFT AND RIGHT HEART CATHETERIZATION WITH CORONARY ANGIOGRAM;  Surgeon: Lorretta Harp, MD;  Location: [facility] CATH LAB;  Service: Cardiovascular;  Laterality: N/A;   NM MYOCAR PERF WALL MOTION  10/29/2010  normal   REPLACEMENT ASCENDING AORTA  07/07/2012   Procedure: REPLACEMENT ASCENDING AORTA;  Surgeon: Ivin Poot, MD;  Location: [place];  Service: Open Heart Surgery;  Laterality: N/A;  CIRCULATORY ARREST   RIGHT HEART CATH AND CORONARY ANGIOGRAPHY N/A 04/19/2021   Procedure: RIGHT HEART CATH AND CORONARY ANGIOGRAPHY;  Surgeon: Burnell Blanks, MD;  Location: [place] CV LAB;  Service: Cardiovascular;  Laterality: N/A;   TEE WITHOUT CARDIOVERSION  06/18/2012   Procedure: TRANSESOPHAGEAL ECHOCARDIOGRAM (TEE);  Surgeon: Sanda Klein, MD;  Location: [facility] ENDOSCOPY;  Service: Cardiovascular;  Laterality: N/A;  right and left heart cath after this  procedure   TEE WITHOUT CARDIOVERSION  07/15/2012   Procedure: TRANSESOPHAGEAL ECHOCARDIOGRAM (TEE);  Surgeon: Pixie Casino, MD;  Location: [facility] ENDOSCOPY;  Service: Cardiovascular;  Laterality: N/A;   TEE WITHOUT CARDIOVERSION N/A 03/27/2021   Procedure: TRANSESOPHAGEAL ECHOCARDIOGRAM (TEE);  Surgeon: Sanda Klein, MD;  Location: [facility] ENDOSCOPY;  Service: Cardiovascular;  Laterality: N/A;   TONSILLECTOMY      Family History  Problem Relation Age of Onset   Heart attack Father     Social History   Socioeconomic History   Marital status: Widowed    Spouse name: Not on file   Number of children: 1   Years of education: Not on file   Highest education level: Not on file  Occupational History   Occupation: Retired-realtor  Tobacco Use   Smoking status: Never   Smokeless tobacco: Never  Vaping Use   Vaping Use: Never used  Substance and Sexual Activity   Alcohol use: No   Drug use: No   Sexual activity: Not Currently  Other Topics Concern   Not on file  Social History Narrative   Not on file   Social Determinants of Health   Financial Resource Strain: Not on file  Food Insecurity: Not on file  Transportation Needs: Not on file  Physical Activity: Not on file  Stress: Not on file  Social Connections: Not on file  Intimate Partner Violence: Not on file    Prior to Admission medications   Medication Sig Start Date End Date Taking? Authorizing Provider  albuterol (VENTOLIN HFA) 108 (90 Base) MCG/ACT inhaler Inhale 2 puffs into the lungs every 6 (six) hours as needed for wheezing or shortness of breath.   Yes [provider]  amiodarone (PACERONE) 200 MG tablet Take 1/2 tablet by mouth daily only on Monday, Wednesday, Friday and Saturday 03/05/21  Yes Fenton, Clint R, PA  apixaban (ELIQUIS) 2.5 MG TABS tablet Take 1 tablet (2.5 mg total) by mouth 2 (two) times daily. 09/27/20  Yes Lorretta Harp, MD  azelastine (ASTELIN) 0.1 % nasal spray Place 2 sprays into both  nostrils daily as needed for allergies. 09/05/20  Yes [provider]  b complex vitamins capsule Take 1 capsule by mouth daily.   Yes [provider]  Calcium Carbonate-Vit D-Min (CALTRATE MINIS PLUS MINERALS PO) Take 40 mcg by mouth every morning.   Yes [provider]  cholecalciferol (VITAMIN D) 25 MCG (1000 UNIT) tablet Take 1,000 Units by mouth every evening.   Yes [provider]  FARXIGA 10 MG TABS tablet Take 10 mg by mouth daily. 02/07/21  Yes [provider]  Fe Fum-FA-B Cmp-C-Zn-Mg-Mn-Cu (HEMOCYTE PLUS) 106-1 MG CAPS Take 1 capsule by mouth daily. 01/18/21  Yes [provider]  ipratropium (ATROVENT) 0.06 % nasal spray 2 sprays in each nostril every 6 hours if needed 02/26/21  Yes Kozlow,  Donnamarie Poag, MD  ipratropium-albuterol (DUONEB) 0.5-2.5 (3) MG/3ML SOLN Inhale 3 mLs into the lungs every 6 (six) hours as needed (shortness of breath or wheezing). 10/06/20  Yes [provider]  metoprolol tartrate (LOPRESSOR) 25 MG tablet Take 1 tablet (25 mg total) by mouth 2 (two) times daily. 06/05/20  Yes Turner, Eber Hong, MD  multivitamin-lutein (OCUVITE-LUTEIN) CAPS capsule Take 1 capsule by mouth daily.   Yes [provider]  pantoprazole (PROTONIX) 40 MG tablet TAKE ONE TABLET BY MOUTH ONCE DAILY BEFORE meal of choice 08/30/20  Yes Lorretta Harp, MD  Polyethyl Glycol-Propyl Glycol (SYSTANE) 0.4-0.3 % SOLN Place 1 drop into both eyes 2 (two) times daily as needed (dry eyes).   Yes [provider]  pravastatin (PRAVACHOL) 10 MG tablet Take 10 mg by mouth every evening.   Yes [provider]  SUMAtriptan (IMITREX) 100 MG tablet Take 100 mg by mouth every 2 (two) hours as needed for migraine. May repeat in 2 hours if headache persists or recurs.   Yes [provider]  torsemide (DEMADEX) 10 MG tablet Take 2 tablets (20 mg total) by mouth every morning. 04/19/21  Yes Burnell Blanks, MD  triamcinolone cream  (KENALOG) 0.1 % Apply 1 application topically daily as needed for rash. 04/03/21  Yes [provider]    Current Outpatient Medications  Medication Sig Dispense Refill   albuterol (VENTOLIN HFA) 108 (90 Base) MCG/ACT inhaler Inhale 2 puffs into the lungs every 6 (six) hours as needed for wheezing or shortness of breath.     amiodarone (PACERONE) 200 MG tablet Take 1/2 tablet by mouth daily only on Monday, Wednesday, Friday and Saturday     apixaban (ELIQUIS) 2.5 MG TABS tablet Take 1 tablet (2.5 mg total) by mouth 2 (two) times daily. 60 tablet 11   azelastine (ASTELIN) 0.1 % nasal spray Place 2 sprays into both nostrils daily as needed for allergies.     b complex vitamins capsule Take 1 capsule by mouth daily.     Calcium Carbonate-Vit D-Min (CALTRATE MINIS PLUS MINERALS PO) Take 40 mcg by mouth every morning.     cholecalciferol (VITAMIN D) 25 MCG (1000 UNIT) tablet Take 1,000 Units by mouth every evening.     FARXIGA 10 MG TABS tablet Take 10 mg by mouth daily.     Fe Fum-FA-B Cmp-C-Zn-Mg-Mn-Cu (HEMOCYTE PLUS) 106-1 MG CAPS Take 1 capsule by mouth daily.     ipratropium (ATROVENT) 0.06 % nasal spray 2 sprays in each nostril every 6 hours if needed 15 mL 5   ipratropium-albuterol (DUONEB) 0.5-2.5 (3) MG/3ML SOLN Inhale 3 mLs into the lungs every 6 (six) hours as needed (shortness of breath or wheezing).     metoprolol tartrate (LOPRESSOR) 25 MG tablet Take 1 tablet (25 mg total) by mouth 2 (two) times daily. 180 tablet 3   multivitamin-lutein (OCUVITE-LUTEIN) CAPS capsule Take 1 capsule by mouth daily.     pantoprazole (PROTONIX) 40 MG tablet TAKE ONE TABLET BY MOUTH ONCE DAILY BEFORE meal of choice 90 tablet 2   Polyethyl Glycol-Propyl Glycol (SYSTANE) 0.4-0.3 % SOLN Place 1 drop into both eyes 2 (two) times daily as needed (dry eyes).     pravastatin (PRAVACHOL) 10 MG tablet Take 10 mg by mouth every evening.     SUMAtriptan (IMITREX) 100 MG tablet Take 100 mg by mouth every 2 (two)  hours as needed for migraine. May repeat in 2 hours if headache persists or recurs.  torsemide (DEMADEX) 10 MG tablet Take 2 tablets (20 mg total) by mouth every morning. 90 tablet 1   triamcinolone cream (KENALOG) 0.1 % Apply 1 application topically daily as needed for rash.     No current facility-administered medications for this visit.    Allergies  Allergen Reactions   Other Nausea And Vomiting    Headache also  Also from artificial sweeetners   Codeine Nausea And Vomiting      Review of Systems:   General:  normal appetite, + decreased energy, no weight gain, no weight loss, no fever  Cardiac:  + chest tightness with exertion, no chest pain at rest, +SOB with  exertion, no resting SOB, no PND, no orthopnea, no palpitations, no arrhythmia, + atrial fibrillation, + LE edema, no dizzy spells, no syncope  Respiratory:  + shortness of breath, no home oxygen, no productive cough, no dry cough, no bronchitis, no wheezing, no hemoptysis, no asthma, no pain with inspiration or cough, no sleep apnea, no CPAP at night  GI:   no difficulty swallowing, n reflux, ono frequent heartburn, no hiatal hernia, no abdominal pain, no constipation, no diarrhea, no hematochezia, no hematemesis, no melena  GU:   no dysuria,  no frequency, no urinary tract infection, no hematuria,  no kidney stones, + kidney disease  Vascular:  no pain suggestive of claudication, no pain in feet, no leg cramps, no varicose veins, no DVT, no non-healing foot ulcer  Neuro:   no stroke, no TIA's, no seizures, no headaches, no temporary blindness one eye,  no slurred speech, no peripheral neuropathy, no chronic pain, no instability of gait, no memory/cognitive dysfunction  Musculoskeletal: no arthritis, no joint swelling, no myalgias, no difficulty walking, mormal mobility   Skin:   no rash, no itching, no skin infections, no pressure sores or ulcerations  Psych:   no anxiety, no depression, no nervousness, no unusual recent  stress  Eyes:   no blurry vision, no floaters, no recent vision changes, + wears glasses or contacts  ENT:   + hearing loss, no loose or painful teeth, no dentures, last saw dentist 04/2021  Hematologic:  no easy bruising, no abnormal bleeding, no clotting disorder, no frequent epistaxis  Endocrine:  no diabetes, does not check CBG's at home     Physical Exam:   BP (!) 104/44 (BP Location: Right Arm, Patient Position: Sitting)   Pulse (!) 55   Resp 20   Ht 5\' 1"  (1.549 m)   SpO2 95%   BMI 20.78 kg/m   General:  Thin,  well-appearing  HEENT:  Unremarkable, NCAT, PERLA, EOMI  Neck:   no JVD, no bruits, no adenopathy   Chest:   clear to auscultation, symmetrical breath sounds, no wheezes, no rhonchi   CV:   RRR, 3/6 systolic murmur RSB, no diastolic murmur  Abdomen:  soft, non-tender, no masses   Extremities:  warm, well-perfused, pulses palpable at ankle, no lower extremity edema  Rectal/GU  Deferred  Neuro:   Grossly non-focal and symmetrical throughout  Skin:   Clean and dry, no rashes, no breakdown  Diagnostic Tests:  ECHOCARDIOGRAM REPORT         Patient Name:   [name] [name] Date of Exam: 03/06/2021  Medical Rec #:  [id number]         Height:       61.0 in  Accession #:    [id number]        Weight:  117.0 lb  Date of Birth:  04/22/1941         BSA:          1.504 m  Patient Age:    19 years          BP:           110/62 mmHg  Patient Gender: F                 HR:           49 bpm.  Exam Location:  [place]   Procedure: 2D Echo, Cardiac Doppler and Color Doppler   Indications:    Z95.2 AVR     History:        Patient has prior history of Echocardiogram examinations,  most                  recent 07/19/2020. H/o AVR, Arrythmias:Atrial  Fibrillation; Risk                  Factors:Hypertension and Dyslipidemia.     Sonographer:    Coralyn Helling RDCS  Referring Phys: [place]     1. Left ventricular ejection fraction, by  estimation, is 55 to 60%. The  left ventricle has normal function. The left ventricle has no regional  wall motion abnormalities. There is moderate left ventricular hypertrophy.  Left ventricular diastolic  parameters are consistent with Grade II diastolic dysfunction  (pseudonormalization).   2. Right ventricular systolic function is mildly reduced. The right  ventricular size is normal. There is normal pulmonary artery systolic  pressure. The estimated right ventricular systolic pressure is 00.8 mmHg.   3. Left atrial size was mild to moderately dilated.   4. Right atrial size was mildly dilated.   5. The mitral valve is normal in structure. Mild mitral valve  regurgitation. No evidence of mitral stenosis.   6. There is a bioprosthetic aortic valve, the valve is heavily calcified.  Elevated mean gradient 28 mmHg. There is at least moderate eccentric  valvular regurgitation. There is not holodiastolic flow reversal present  in the descending thoracic aorta.  There is some restriction to valve opening due to calcification, but  suspect significant AI contributes to the mean gradient. Would consider  assessment by TEE.   7. Aortic dilatation noted. There is mild dilatation of the aortic root,  measuring 40 mm.   8. The inferior vena cava is normal in size with greater than 50%  respiratory variability, suggesting right atrial pressure of 3 mmHg.   FINDINGS   Left Ventricle: Left ventricular ejection fraction, by estimation, is 55  to 60%. The left ventricle has normal function. The left ventricle has no  regional wall motion abnormalities. The left ventricular internal cavity  size was normal in size. There is   moderate left ventricular hypertrophy. Left ventricular diastolic  parameters are consistent with Grade II diastolic dysfunction  (pseudonormalization).   Right Ventricle: The right ventricular size is normal. No increase in  right ventricular wall thickness. Right  ventricular systolic function is  mildly reduced. There is normal pulmonary artery systolic pressure. The  tricuspid regurgitant velocity is 2.87  m/s, and with an assumed right atrial pressure of 3 mmHg, the estimated  right ventricular systolic pressure is 67.6 mmHg.   Left Atrium: Left atrial size was mild to moderately dilated.   Right Atrium: Right atrial size was mildly dilated.   Pericardium: There is no  evidence of pericardial effusion.   Mitral Valve: The mitral valve is normal in structure. There is mild  calcification of the mitral valve leaflet(s). Mild mitral annular  calcification. Mild mitral valve regurgitation. No evidence of mitral  valve stenosis.   Tricuspid Valve: The tricuspid valve is normal in structure. Tricuspid  valve regurgitation is trivial.   Aortic Valve: There is a bioprosthetic aortic valve, the valve is heavily  calcified. Elevated mean gradient 28 mmHg. There is at least moderate  eccentric valvular regurgitation. There is not holodiastolic flow reversal  present in the descending thoracic  aorta. There is some restriction to valve opening due to calcification,  but suspect significant AI contributes to the mean gradient. Would  consider assessment by TEE. The aortic valve has been repaired/replaced.  Aortic valve regurgitation is moderate.  Aortic regurgitation PHT measures 390 msec. Aortic valve mean gradient  measures 26.6 mmHg. Aortic valve peak gradient measures 49.1 mmHg. Aortic  valve area, by VTI measures 0.90 cm.   Pulmonic Valve: The pulmonic valve was normal in structure. Pulmonic valve  regurgitation is trivial.   Aorta: Aortic dilatation noted. There is mild dilatation of the aortic  root, measuring 40 mm.   Venous: The inferior vena cava is normal in size with greater than 50%  respiratory variability, suggesting right atrial pressure of 3 mmHg.   IAS/Shunts: No atrial level shunt detected by color flow Doppler.      LEFT  VENTRICLE  PLAX 2D  LVIDd:         4.80 cm  Diastology  LVIDs:         3.30 cm  LV e' medial:    4.03 cm/s  LV PW:         1.40 cm  LV E/e' medial:  19.7  LV IVS:        1.70 cm  LV e' lateral:   9.28 cm/s  LVOT diam:     2.10 cm  LV E/e' lateral: 8.5  LV SV:         92  LV SV Index:   61  LVOT Area:     3.46 cm      RIGHT VENTRICLE            IVC  RV S prime:     8.92 cm/s  IVC diam: 1.90 cm  TAPSE (M-mode): 1.4 cm  RVSP:           35.9 mmHg   LEFT ATRIUM             Index       RIGHT ATRIUM           Index  LA diam:        3.50 cm 2.33 cm/m  RA Pressure: 3.00 mmHg  LA Vol (A2C):   62.2 ml 41.36 ml/m RA Area:     15.80 cm  LA Vol (A4C):   56.6 ml 37.64 ml/m RA Volume:   41.30 ml  27.46 ml/m  LA Biplane Vol: 59.4 ml 39.50 ml/m   AORTIC VALVE  AV Area (Vmax):    0.87 cm  AV Area (Vmean):   0.91 cm  AV Area (VTI):     0.90 cm  AV Vmax:           350.20 cm/s  AV Vmean:          239.000 cm/s  AV VTI:            1.020 m  AV Peak Grad:      49.1 mmHg  AV Mean Grad:      26.6 mmHg  LVOT Vmax:         87.46 cm/s  LVOT Vmean:        62.860 cm/s  LVOT VTI:          0.264 m  LVOT/AV VTI ratio: 0.26  AI PHT:            390 msec     AORTA  Ao Root diam: 4.00 cm  Ao Asc diam:  3.10 cm   MITRAL VALVE               TRICUSPID VALVE  MV Area (PHT): 4.32 cm    TR Peak grad:   32.9 mmHg  MV Decel Time: 176 msec    TR Vmax:        287.00 cm/s  MV E velocity: 79.34 cm/s  Estimated RAP:  3.00 mmHg  MV A velocity: 48.76 cm/s  RVSP:           35.9 mmHg  MV E/A ratio:  1.63                             SHUNTS                             Systemic VTI:  0.26 m                             Systemic Diam: 2.10 cm   Loralie Champagne MD  Electronically signed by Loralie Champagne MD  Signature Date/Time: 03/06/2021/4:37:55 PM         Final        TRANSESOPHOGEAL ECHO REPORT         Patient Name:   [name] [name] Date of Exam: 03/27/2021  Medical Rec #:  [id number]         Height:        61.0 in  Accession #:    [id number]        Weight:       116.4 lb  Date of Birth:  [birth year]         BSA:          1.501 m  Patient Age:    17 years          BP:           143/55 mmHg  Patient Gender: F                 HR:           47 bpm.  Exam Location:  Outpatient   Procedure: Transesophageal Echo, Cardiac Doppler and Color Doppler   Indications:     I35.2 Nonrheumatic aortic (valve) stenosis with  insufficiency     History:         Patient has prior history of Echocardiogram examinations,  most                   recent 03/06/2021. Aortic Valve Disease; Risk                   Factors:Hypertension and Dyslipidemia.                   Aortic Valve: 21 mm Edwards Perimount [phone]  bioprosthetic  valve                   is present in the aortic position.     Sonographer:     Tiffany Dance RVT  Referring Phys:  [id number]  Diagnosing Phys: Sanda Klein MD   PROCEDURE: The transesophogeal probe was passed without difficulty through  the esophogus of the patient. Sedation performed by different physician.  The patient was monitored while under deep sedation. Anesthestetic  sedation was provided intravenously by  Anesthesiology: 68.64mg  of Propofol. The patient developed no  complications during the procedure.   IMPRESSIONS     1. Left ventricular ejection fraction, by estimation, is 60 to 65%. The  left ventricle has normal function. The left ventricle has no regional  wall motion abnormalities. Left ventricular diastolic parameters are  consistent with Grade II diastolic  dysfunction (pseudonormalization). Elevated left atrial pressure.   2. Right ventricular systolic function is mildly reduced. The right  ventricular size is normal. There is moderately elevated pulmonary artery  systolic pressure.   3. Left atrial size was mildly dilated. No left atrial/left atrial  appendage thrombus was detected.   4. Right atrial size was mildly dilated.   5. The mitral valve  is normal in structure. Mild to moderate mitral valve  regurgitation.   6. The aortic valve has been repaired/replaced. There is moderate  calcification of the aortic valve. There is moderate thickening of the  aortic valve. Aortic valve regurgitation is moderate to severe and is  intraanular (there is no perivalvular leak).  Mild aortic valve stenosis. There is a 21 mm Edwards bioprosthetic valve  present in the aortic position. Aortic regurgitation PHT measures 336  msec. Aortic valve mean gradient measures 13.4 mmHg. Aortic valve Vmax  measures 2.57 m/s. Aortic valve  acceleration time measures 114 msec.   FINDINGS   Left Ventricle: Left ventricular ejection fraction, by estimation, is 60  to 65%. The left ventricle has normal function. The left ventricle has no  regional wall motion abnormalities. The left ventricular internal cavity  size was normal in size. There is   no left ventricular hypertrophy. Abnormal (paradoxical) septal motion  consistent with post-operative status. Left ventricular diastolic  parameters are consistent with Grade II diastolic dysfunction  (pseudonormalization).   Right Ventricle: The right ventricular size is normal. No increase in  right ventricular wall thickness. Right ventricular systolic function is  mildly reduced. There is moderately elevated pulmonary artery systolic  pressure. The tricuspid regurgitant  velocity is 3.81 m/s, and with an assumed right atrial pressure of 5 mmHg,  the estimated right ventricular systolic pressure is 02.7 mmHg.   Left Atrium: Left atrial size was mildly dilated. No left atrial/left  atrial appendage thrombus was detected.   Right Atrium: Right atrial size was mildly dilated.   Pericardium: There is no evidence of pericardial effusion.   Mitral Valve: The mitral valve is normal in structure. Mild mitral annular  calcification. Mild to moderate mitral valve regurgitation, with  centrally-directed jet.    Tricuspid Valve: The tricuspid valve is normal in structure. Tricuspid  valve regurgitation is mild.   Aortic Valve: Unable to get an adequate measurement of the LVOT VTI  profile. The aortic valve has been repaired/replaced. There is moderate  calcification of the aortic valve. There is moderate thickening of the  aortic valve. Aortic valve regurgitation is   moderate to severe. Aortic regurgitation PHT measures 336 msec. Mild  aortic stenosis  is present. Aortic valve mean gradient measures 13.4 mmHg.  Aortic valve peak gradient measures 26.4 mmHg. There is a 21 mm Edwards  Perimount [id number] bioprosthetic valve  present in the aortic position.   Pulmonic Valve: The pulmonic valve was normal in structure. Pulmonic valve  regurgitation is trivial.   Aorta: The aortic root, ascending aorta and aortic arch are all  structurally normal, with no evidence of dilitation or obstruction. There  is minimal (Grade I) layered plaque.   IAS/Shunts: No atrial level shunt detected by color flow Doppler.      AORTIC VALVE  AV Vmax:      257.02 cm/s  AV Vmean:     169.276 cm/s  AV VTI:       0.711 m  AV Peak Grad: 26.4 mmHg  AV Mean Grad: 13.4 mmHg  AI PHT:       336 msec   MITRAL VALVE               TRICUSPID VALVE  MV Area (PHT): 3.70 cm    TR Peak grad:   58.1 mmHg  MV Decel Time: 205 msec    TR Vmax:        381.05 cm/s  MV E velocity: 86.80 cm/s  MV A velocity: 60.90 cm/s  MV E/A ratio:  1.43   Mihai Croitoru MD  Electronically signed by Sanda Klein MD  Signature Date/Time: 03/27/2021/12:52:26 PM         Final     Physicians  Panel Physicians Referring Physician Case Authorizing Physician  Burnell Blanks, MD (Primary)     Procedures  RIGHT HEART CATH AND CORONARY ANGIOGRAPHY   Conclusion      Hemodynamic findings consistent with moderate pulmonary hypertension.   No angiographic evidence of CAD Elevated right heart pressures   Recommendations: Will  continue workup for TAVR. Will increase Torsemide to 20 mg daily. BMET next week then proceed with pre-TAVR CT scans. Resume Eliquis tomorrow.    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 Planning for TAVR  Operator: Dr. Lenna Sciara Assisting: Dr. Angelena Form  Procedure: The risks, benefits, complications, treatment options, and expected outcomes were discussed with the patient. The patient and/or family concurred with the proposed plan, giving informed consent. The patient was brought to the cath lab after IV hydration was given. The patient was sedated with Versed and Fentanyl. The IV catheter present in the right antecubital vein was changed for a 6 French sheath. Right heart catheterization performed with a balloon tipped catheter. The right wrist was prepped and draped in a sterile fashion. 1% lidocaine was used for local anesthesia. Using the modified Seldinger access technique, a 6 French Slender sheath was placed in the right radial artery. 3 mg Verapamil was given through the sheath. 4000 units IV heparin was given. Standard diagnostic catheters were used to perform selective coronary angiography. A pigtail catheter was used to perform a left ventricular angiogram. The sheath was removed from the right radial artery and a Terumo hemostasis band was applied at the arteriotomy site on the right wrist.      Estimated blood loss <50 mL.   During this procedure medications were administered to achieve and maintain moderate conscious sedation while the patient's heart rate, blood pressure, and oxygen saturation were continuously monitored and I  was present face-to-face 100% of this time.   Medications (Filter: Administrations occurring from 0925 to 1019 on  04/19/21) Heparin (Porcine) in NaCl 1000-0.9 UT/500ML-% SOLN (mL) Total volume:  1,000 mL Date/Time Rate/Dose/Volume Action   04/19/21 0929 500 mL Given   0929 500 mL Given    midazolam (VERSED) injection (mg) Total dose:  1 mg Date/Time Rate/Dose/Volume Action   04/19/21 0931 1 mg Given    fentaNYL (SUBLIMAZE) injection (mcg) Total dose:  25 mcg Date/Time Rate/Dose/Volume Action   04/19/21 0932 25 mcg Given    lidocaine (PF) (XYLOCAINE) 1 % injection (mL) Total volume:  4 mL Date/Time Rate/Dose/Volume Action   04/19/21 0938 2 mL Given   0941 2 mL Given    Radial Cocktail/Verapamil only (mL) Total volume:  10 mL Date/Time Rate/Dose/Volume Action   04/19/21 0941 10 mL Given    heparin sodium (porcine) injection (Units) Total dose:  4,000 Units Date/Time Rate/Dose/Volume Action   04/19/21 0943 4,000 Units Given    iohexol (OMNIPAQUE) 350 MG/ML injection (mL) Total volume:  70 mL Date/Time Rate/Dose/Volume Action   04/19/21 1013 70 mL Given    Sedation Time  Sedation Time Physician-1: 36 minutes 25 seconds Contrast  Medication Name Total Dose  iohexol (OMNIPAQUE) 350 MG/ML injection 70 mL   Radiation/Fluoro  Fluoro time: 5.8 (min) DAP: 9038 (mGycm2) Cumulative Air Kerma: 132 (mGy) Coronary Findings  Diagnostic Dominance: Right Left Anterior Descending  Vessel is large. Vessel is angiographically normal.  Left Circumflex  Vessel is large. Vessel is angiographically normal.  Right Coronary Artery  Vessel is large. Vessel is angiographically normal.  Intervention  No interventions have been documented. Right Heart  Right Heart Pressures Hemodynamic findings consistent with moderate pulmonary hypertension.  Right Atrium Right atrial pressure is elevated.   Coronary Diagrams  Diagnostic Dominance: Right Intervention  Implants     No implant documentation for this case.   Syngo Images   Show images for CARDIAC CATHETERIZATION Images on Long  Term Storage   Show images for Crumbley, [employer] F Link to Procedure Log  Procedure Log    Hemo Data  Flowsheet Row Most Recent Value  Fick Cardiac Output 5.64 L/min  Fick Cardiac Output Index 3.84 (L/min)/BSA  RA A Wave 15 mmHg  RA V Wave 15 mmHg  RA Mean 13 mmHg  RV Systolic Pressure 69 mmHg  RV Diastolic Pressure 9 mmHg  RV EDP 15 mmHg  PA Systolic Pressure 70 mmHg  PA Diastolic Pressure 23 mmHg  PA Mean 38 mmHg  PW A Wave 24 mmHg  PW V Wave 45 mmHg  PW Mean 26 mmHg  AO Systolic Pressure 149 mmHg  AO Diastolic Pressure 43 mmHg  AO Mean 69 mmHg  QP/QS 0.65  TPVR Index 9.88 HRUI  TSVR Index 17.94 HRUI  PVR SVR Ratio 0.21  TPVR/TSVR Ratio 0.55    ADDENDUM REPORT: 05/28/2021 09:59   ADDENDUM: Extracardiac findings will be described separately under dictation for contemporaneously obtained CTA chest, abdomen and pelvis dated 05/25/2021. Please see that report for full description of relevant extracardiac findings.     Electronically Signed   By: Vinnie Langton M.D.   On: 05/28/2021 09:59    Addended by Etheleen Mayhew, MD on 05/28/2021 10:02 AM   Study Result  Narrative & Impression  CLINICAL DATA:  Aortic stenosis and regurgitation, with prior cardiothoracic surgeries in 2004 and 2013.   EXAM: Cardiac TAVR CT   TECHNIQUE: The patient was scanned on a [employer]  240 slice scanner. A 120 kV retrospective scan was triggered in the descending thoracic aorta at 111 HU's. Gantry rotation speed was 270 msecs and collimation was .9 mm. No beta blockade or nitro were given. The 3D data set was reconstructed in 5% intervals of the R-R cycle. Systolic and diastolic phases were analyzed on a dedicated work station using MPR, MIP and VRT modes. The patient received 100 cc of contrast.   FINDINGS: Prosthetic aortic Valve: There is a 21 mm Edwards Perimount 2700 Valve in the Aortic position with appropriate leaflet excursion and mild thickening on the right  and left leaflets   LVOT calcification: None   Annular calcification: None   Aortic Annulus Measurements (prosthetic)   Major annulus diameter: 20 mm   Minor annulus diameter:19 mm   Annular perimeter: 60 mm   Geometric orifice area: 2.7 cm2   Aortic Root Measurements   Sinus of Valsalva perimeter (native): 114 mm   Prosthetic SoV perimeter 69 mm   Sinotubular Junction: 25 mm   Ascending Thoracic Aorta: 29 mm   Aortic Arch: 42 mm   Descending Thoracic Aorta: 25 mm   There are invaginations into the ascending aorta without coarctation likely reflective of prior ascending aortic surgical intervention.   Sinus of Valsalva Measurements:   Sinus to Commissure   Right coronary cusp width: 21 mm   Left coronary cusp width: 21 mm   Non coronary cusp width: 21 mm   Coronary Artery Height above Annulus:   Left Main: 18 mm   VTC: 12 mm   Right Coronary: 17 mm   VTC: 5 mm   Coronary Arteries:   Coronary Artery Calcium score: 0   Left main: 0   Left anterior descending artery: 0   Left circumflex artery: 0   Right coronary artery: 0   Total: 0   Percentile: 1st for age, sex, and race matched control.   Optimum Fluoroscopic Angle for Delivery: LAO 16 CAU 12   Valves for structural team consideration: 23 mm Edwards Sapien, 23 mm Evolut PRO   IMPRESSION: 1. Prosthetic aortic valve evaluation with findings pertinent to Valve in Valve TAVR procedure are detailed above.   2. 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   RECOMMENDATIONS:   Coronary artery calcium (CAC) score is a strong predictor of incident coronary heart disease (CHD) and provides predictive information beyond traditional risk factors. CAC scoring is reasonable to use in the decision to withhold, postpone, or initiate statin therapy in intermediate-risk or selected borderline-risk asymptomatic adults (age 40-75 years and  LDL-C >=70 to <190 mg/dL) who do not have diabetes or established atherosclerotic cardiovascular disease (ASCVD).* In intermediate-risk (10-year ASCVD risk >=7.5% to <20%) adults or selected borderline-risk (10-year ASCVD risk >=5% to <7.5%) adults in whom a CAC score is measured for the purpose of making a treatment decision the following recommendations have been made:   If CAC = 0, it is reasonable to withhold statin therapy and reassess in 5 to 10 years, as long as higher risk conditions are absent (diabetes mellitus, family history of premature CHD in first degree relatives (males <55 years; females <65 years), cigarette smoking, LDL >=190 mg/dL or other independent risk factors).   If CAC is 1 to 99, it is reasonable to initiate statin therapy for patients >=54 years of age.   If CAC is >=100 or >=75th percentile, it is reasonable to initiate statin therapy at any age.  Cardiology referral should be considered for patients with CAC scores >=400 or >=75th percentile.   *2018 AHA/ACC/AACVPR/AAPA/ABC/ACPM/ADA/AGS/APhA/ASPC/NLA/PCNA Guideline on the Management of Blood Cholesterol: A Report of the American College of Cardiology/American Heart Association Task Force on Clinical Practice Guidelines. J Am Coll Cardiol. 2019;73(24):3168-3209.   Mahesh  Chandrasekhar   Electronically Signed: By: Rudean Haskell M.D. On: 05/27/2021 15:19    Narrative & Impression  CLINICAL DATA:  80 year old female with history of non rheumatic aortic valve insufficiency. Preprocedural study prior to potential transcatheter aortic valve replacement (TAVR) procedure.   EXAM: CT ANGIOGRAPHY CHEST, ABDOMEN AND PELVIS   TECHNIQUE: Multidetector CT imaging through the chest, abdomen and pelvis was performed using the standard protocol during bolus administration of intravenous contrast. Multiplanar reconstructed images and MIPs were obtained and reviewed to evaluate the vascular  anatomy.   CONTRAST:  127mL OMNIPAQUE IOHEXOL 350 MG/ML SOLN   COMPARISON:  CTA of the chest, abdomen and pelvis 06/06/2020.   FINDINGS: CTA CHEST FINDINGS   Cardiovascular: Heart size is normal. There is no significant pericardial fluid, thickening or pericardial calcification. Atherosclerotic calcifications in the thoracic aorta. No definite coronary artery calcifications. Status post median sternotomy for Bentall procedure with bioprosthetic aortic valve noted. 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   Mediastinum/Lymph Nodes: No pathologically enlarged mediastinal or hilar lymph nodes. Esophagus is unremarkable in appearance. No axillary lymphadenopathy.   Lungs/Pleura: There is a mosaic attenuation throughout the lung parenchyma, suggesting areas of air trapping from small airways disease. No acute consolidative airspace disease. No pleural effusions. No suspicious appearing pulmonary nodules or masses are noted   Musculoskeletal/Soft Tissues: Median sternotomy wires. There are no aggressive appearing lytic or blastic lesions noted in the visualized portions of the skeleton.   CTA ABDOMEN AND PELVIS FINDINGS   Hepatobiliary: No suspicious cystic or solid hepatic lesions. No intra or extrahepatic biliary ductal dilatation. Gallbladder is normal in appearance.   Pancreas: No pancreatic mass. No pancreatic ductal dilatation. No pancreatic or peripancreatic fluid collections or inflammatory changes.   Spleen: Unremarkable.   Adrenals/Urinary Tract: Low-attenuation lesions in both kidneys measuring up to 1.7 cm in the upper pole of the left kidney, compatible with simple cysts. No suspicious renal lesions. No hydroureteronephrosis. Urinary bladder is normal in appearance. Bilateral adrenal glands are normal in appearance.   Stomach/Bowel: The appearance of the stomach is normal. There is no pathologic dilatation of  small bowel or colon. Numerous colonic diverticulae are noted, particularly in the sigmoid colon, without surrounding inflammatory changes to suggest an acute diverticulitis at this time. The appendix is not confidently identified and may be surgically absent. Regardless, there are no inflammatory changes noted adjacent to the cecum to suggest the presence of an acute appendicitis at this time.   Vascular/Lymphatic: Aortic atherosclerosis, with vascular findings and measurements pertinent to potential TAVR procedure, as detailed below. No aneurysm or dissection noted in the abdominal or pelvic vasculature. No lymphadenopathy noted in the abdomen or pelvis.   Reproductive: Status post hysterectomy. Ovaries are not confidently identified may be surgically absent or atrophic.   Other: No significant volume of ascites.  No pneumoperitoneum.   Musculoskeletal: There are no aggressive appearing lytic or blastic lesions noted in the visualized portions of the skeleton.   VASCULAR MEASUREMENTS PERTINENT TO TAVR:   AORTA:   Minimal Aortic Diameter-13 x 12 mm   Severity of Aortic Calcification-mild   RIGHT PELVIS:   Right Common Iliac Artery -  Minimal Diameter-9.3 x 8.2 mm   Tortuosity-mild   Calcification-mild   Right External Iliac Artery -   Minimal Diameter-7.4 x 7.3 mm   Tortuosity-severe   Calcification-none   Right Common Femoral Artery -   Minimal Diameter-6.6 x 7.0 mm   Tortuosity-mild   Calcification-none   LEFT PELVIS:   Left Common Iliac Artery -   Minimal Diameter-9.7 x 8.9 mm   Tortuosity-severe   Calcification-mild   Left External Iliac Artery -   Minimal Diameter-7.7 x 7.4 mm   Tortuosity-moderate   Calcification-none   Left Common Femoral Artery -   Minimal Diameter-7.5 x 7.7 mm   Tortuosity-mild   Calcification-none   Review of the MIP images confirms the above findings.   IMPRESSION: 1. Vascular findings and measurements  pertinent to potential TAVR procedure, as detailed above. 2. Status post Bentall procedure. 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 Recommend semi-annual imaging followup by CTA or MRA and referral to cardiothoracic surgery if not already obtained. This recommendation follows 2010 ACCF/AHA/AATS/ACR/ASA/SCA/SCAI/SIR/STS/SVM Guidelines for the Diagnosis and Management of Patients With Thoracic Aortic Disease. Circulation. 2010; 121: P824-M35. Aortic aneurysm NOS (ICD10-I71.9). 3. The appearance of the lungs suggests air trapping from small airways disease. 4. Aortic atherosclerosis. 5. Colonic diverticulosis without evidence of acute diverticulitis at this time. 6. Additional incidental findings, as above.     Electronically Signed   By: Vinnie Langton M.D.   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  I have personally reviewed her 2D echo, TEE, cardiac catheterization, and CTA studies.  TEE shows a calcified aortic valve prosthesis with restricted leaflet mobility and severe regurgitation with a pressure half-time of 336 ms.  The mean gradient is 13.4 mmHg.  Left ventricular ejection fraction is normal with grade 2 diastolic dysfunction.  There is mild to moderate mitral regurgitation with a centrally directed jet.  Cardiac catheterization shows no coronary disease with moderate pulmonary  hypertension with a PA pressure of 70/23 and a mean pulmonary wedge pressure of 26 with a V wave of 45.  I agree that aortic valve replacement is indicated in this patient for relief of her progressive symptoms and to prevent left ventricular deterioration.  Given her advanced age and prior cardiac surgery I think the best option for treating her is valve in valve TAVR.  Her 21 mm prosthetic valve is suitable for a 23 mm Edwards SAPIEN 3 valve.  Her abdominal and pelvic CTA shows adequate pelvic vascular access to allow transfemoral insertion.  The patient and her son were counseled at length regarding treatment alternatives for management of severe symptomatic aortic stenosis. The risks and benefits of surgical intervention has been discussed in detail. Long-term prognosis with medical therapy was discussed. Alternative approaches such as conventional surgical aortic valve replacement, transcatheter aortic valve replacement, and palliative medical therapy were compared and contrasted at length. This discussion was placed in the context of the patient's own specific clinical presentation and past medical history. All of their questions have  been addressed.   Following the decision to proceed with transcatheter aortic valve replacement, a discussion was held regarding what types of management strategies would be attempted intraoperatively in the event of life-threatening complications, including whether or not the patient would be considered a candidate for the use of cardiopulmonary bypass and/or conversion to open sternotomy for attempted surgical intervention.  Given her age, stage 3-4 CKD and prior sternotomy for Bentall procedure I do not think she would be a candidate for emergent sternotomy to manage any intraoperative complications.  The patient is aware of the fact that transient use of cardiopulmonary bypass may be necessary. The patient has been advised of a variety of complications that might develop  including but not limited to risks of death, stroke, paravalvular leak, aortic dissection or other major vascular complications, aortic annulus rupture, device embolization, cardiac rupture or perforation, mitral regurgitation, acute myocardial infarction, arrhythmia, heart block or bradycardia requiring permanent pacemaker placement, congestive heart failure, respiratory failure, renal failure, pneumonia, infection, other late complications related to structural valve deterioration or migration, or other complications that might ultimately cause a temporary or permanent loss of functional independence or other long term morbidity. The patient provides full informed consent for the procedure as described and all questions were answered.      Plan:  She will be scheduled for valve in valve TAVR using a SAPIEN 3 valve on 06/19/2021.  I spent 60 minutes performing this consultation and > 50% of this time was spent face to face counseling and coordinating the care of this patient's severe symptomatic prosthetic aortic valve insufficiency.   Gaye Pollack, MD 06/13/2021

## 2021-06-15 NOTE — Progress Notes (Addendum)
PCP - [name] Cardiologist - Quay Burow  Chest x-ray - 06/15/21 EKG - 06/15/21 ECHO - 03/27/21 Cardiac Cath - 04/19/21  Blood Thinner Instructions: Last dose of Eliquis 06/13/21, per patient   COVID TEST- 06/15/21   Anesthesia review: yes, heart history  Patient denies fever, cough and chest pain at PAT appointment  Patient stated she has shortness of breath, ongoing for several months, reason for surgery. Not a new symptom.   All instructions explained to the patient, with a verbal understanding of the material. Patient agrees to go over the instructions while at home for a better understanding. Patient also instructed to self quarantine after being tested for COVID-19. The opportunity to ask questions was provided.

## 2021-06-18 MED ORDER — CEFAZOLIN SODIUM-DEXTROSE 2-4 GM/100ML-% IV SOLN
2.0000 g | INTRAVENOUS | Status: AC
  Administered 2021-06-19: 2 g via INTRAVENOUS
  Filled 2021-06-18: qty 100

## 2021-06-18 MED ORDER — POTASSIUM CHLORIDE 2 MEQ/ML IV SOLN
80.0000 meq | INTRAVENOUS | Status: DC
  Filled 2021-06-18: qty 40

## 2021-06-18 MED ORDER — HEPARIN 30,000 UNITS/1000 ML (OHS) CELLSAVER SOLUTION
Status: DC
  Filled 2021-06-18: qty 1000

## 2021-06-18 MED ORDER — DEXMEDETOMIDINE HCL IN NACL 400 MCG/100ML IV SOLN
0.1000 ug/kg/h | INTRAVENOUS | Status: AC
  Administered 2021-06-19: 1 ug/kg/h via INTRAVENOUS
  Filled 2021-06-18: qty 100

## 2021-06-18 MED ORDER — MAGNESIUM SULFATE 50 % IJ SOLN
40.0000 meq | INTRAMUSCULAR | Status: DC
  Filled 2021-06-18: qty 9.85

## 2021-06-18 MED ORDER — NOREPINEPHRINE 4 MG/250ML-% IV SOLN
0.0000 ug/min | INTRAVENOUS | Status: AC
  Administered 2021-06-19: 1 ug/min via INTRAVENOUS
  Filled 2021-06-18: qty 250

## 2021-06-18 NOTE — H&P (Signed)
[address]       [place] [address]             [phone]      Cardiothoracic Surgery Admission History and Physical   PCP is [name], MD  Referring Provider is Lauree Chandler, MD  Primary Cardiologist is Quay Burow, MD  Reason for admission:  Severe prosthetic valve aortic stenosis   HPI:  The patient is an 80 year old woman with a history of paroxysmal atrial fibrillation on Eliquis, stage III chronic kidney disease, thoracic aortic aneurysm, status post biological Bentall procedure in 2004 by Dr. Darcey Nora who was referred for consideration of valve in valve TAVR. She has a 21 mm Edwards Perimount 7200 bioprosthetic valve. She had an echocardiogram on 03/06/2021 that showed the bioprosthetic aortic valve to be heavily calcified. The mean gradient was 28 mmHg with at least moderate eccentric aortic insufficiency. Aortic root was noted to be 40 mm. She underwent TEE on 03/27/2021 for further evaluation of the valve which showed moderate to severe insufficiency with no paravalvular leak. The mean gradient was 13.4 mmHg with an AI pressure half-time of 336 ms. Left ventricular ejection fraction was 60 to 65%. There is grade 2 diastolic dysfunction. There was mild to moderate mitral regurgitation.  She reports progressive exertional chest tightness and shortness of breath as well as fatigue and lower extremity edema. She has felt better since starting on torsemide. She lives alone in [place] and takes care of her self. She has a son in [place] who is here today. She sees a dentist regularly and has no active dental issues.      Past Medical History:  Diagnosis Date   Anemia    Aortic root dilatation ([place]) 05/11/2012   CTA CHEST   Arthritis    Cardiac asystole, post DCCV requiring use of temp. external pacemaker leads 07/19/2012   Chronic kidney disease    Stage 4   Dyspnea    Dysrhythmia    GERD (gastroesophageal reflux disease)    H/O  aortic valve disorder 10/04/2002   Dr. Tharon Aquas Trigt   Headache(784.0)    Heart murmur    History of migraine headaches    Hyperlipidemia    on statin therapy   Migraines    Thoracic ascending aortic aneurysm    CTA CHEST 05/11/12        Past Surgical History:  Procedure Laterality Date   ABDOMINAL HYSTERECTOMY     AORTIC VALVE REPLACEMENT  10/04/2002    Dr. 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   LEFT AND RIGHT HEART CATHETERIZATION WITH CORONARY ANGIOGRAM N/A 06/18/2012   Procedure: LEFT AND RIGHT HEART CATHETERIZATION WITH CORONARY ANGIOGRAM; Surgeon: Lorretta Harp, MD; Location: [facility] CATH LAB; Service: Cardiovascular; Laterality: N/A;   NM MYOCAR PERF WALL MOTION  10/29/2010   normal   REPLACEMENT ASCENDING AORTA  07/07/2012   Procedure: REPLACEMENT ASCENDING AORTA; Surgeon: Ivin Poot, MD; Location: [place]; Service: Open Heart Surgery; Laterality: N/A; CIRCULATORY ARREST   RIGHT HEART CATH AND CORONARY ANGIOGRAPHY N/A 04/19/2021   Procedure: RIGHT HEART CATH AND CORONARY ANGIOGRAPHY; Surgeon: Burnell Blanks,  MD; Location: [facility] CV LAB; Service: Cardiovascular; Laterality: N/A;   TEE WITHOUT CARDIOVERSION  06/18/2012   Procedure: TRANSESOPHAGEAL ECHOCARDIOGRAM (TEE); Surgeon: Sanda Klein, MD; Location: [facility] ENDOSCOPY; Service: Cardiovascular; Laterality: N/A; right and left heart cath after this procedure   TEE WITHOUT CARDIOVERSION  07/15/2012   Procedure: TRANSESOPHAGEAL ECHOCARDIOGRAM (TEE); Surgeon: Pixie Casino, MD; Location: [facility] ENDOSCOPY; Service: Cardiovascular; Laterality: N/A;   TEE  WITHOUT CARDIOVERSION N/A 03/27/2021   Procedure: TRANSESOPHAGEAL ECHOCARDIOGRAM (TEE); Surgeon: Sanda Klein, MD; Location: [facility] ENDOSCOPY; Service: Cardiovascular; Laterality: N/A;   TONSILLECTOMY          Family History  Problem Relation Age of Onset   Heart attack Father    Social History        Socioeconomic History   Marital status: Widowed    Spouse name: Not on file   Number of children: 1   Years of education: Not on file   Highest education level: Not on file  Occupational History   Occupation: Retired-realtor  Tobacco Use   Smoking status: Never   Smokeless tobacco: Never  Vaping Use   Vaping Use: Never used  Substance and Sexual Activity   Alcohol use: No   Drug use: No   Sexual activity: Not Currently  Other Topics Concern   Not on file  Social History Narrative   Not on file   Social Determinants of Health   Financial Resource Strain: Not on file  Food Insecurity: Not on file  Transportation Needs: Not on file  Physical Activity: Not on file  Stress: Not on file  Social Connections: Not on file  Intimate Partner Violence: Not on file          Prior to Admission medications   Medication Sig Start Date End Date Taking? Authorizing Provider  albuterol (VENTOLIN HFA) 108 (90 Base) MCG/ACT inhaler Inhale 2 puffs into the lungs every 6 (six) hours as needed for wheezing or shortness of breath.   Yes [provider]  amiodarone (PACERONE) 200 MG tablet Take 1/2 tablet by mouth daily only on Monday, Wednesday, Friday and Saturday 03/05/21  Yes Fenton, Clint R, PA  apixaban (ELIQUIS) 2.5 MG TABS tablet Take 1 tablet (2.5 mg total) by mouth 2 (two) times daily. 09/27/20  Yes Lorretta Harp, MD  azelastine (ASTELIN) 0.1 % nasal spray Place 2 sprays into both nostrils daily as needed for allergies. 09/05/20  Yes [provider]  b complex vitamins capsule Take 1 capsule by mouth daily.   Yes [provider]  Calcium Carbonate-Vit D-Min  (CALTRATE MINIS PLUS MINERALS PO) Take 40 mcg by mouth every morning.   Yes [provider]  cholecalciferol (VITAMIN D) 25 MCG (1000 UNIT) tablet Take 1,000 Units by mouth every evening.   Yes [provider]  FARXIGA 10 MG TABS tablet Take 10 mg by mouth daily. 02/07/21  Yes [provider]  Fe Fum-FA-B Cmp-C-Zn-Mg-Mn-Cu (HEMOCYTE PLUS) 106-1 MG CAPS Take 1 capsule by mouth daily. 01/18/21  Yes [provider]  ipratropium (ATROVENT) 0.06 % nasal spray 2 sprays in each nostril every 6 hours if needed 02/26/21  Yes Kozlow, Donnamarie Poag, MD  ipratropium-albuterol (DUONEB) 0.5-2.5 (3) MG/3ML SOLN Inhale 3 mLs into the lungs every 6 (six) hours as needed (shortness of breath or wheezing). 10/06/20  Yes [provider]  metoprolol tartrate (LOPRESSOR) 25 MG tablet Take 1 tablet (25 mg total) by mouth 2 (two) times daily. 06/05/20  Yes Sueanne Margarita, MD  multivitamin-lutein (  OCUVITE-LUTEIN) CAPS capsule Take 1 capsule by mouth daily.   Yes [provider]  pantoprazole (PROTONIX) 40 MG tablet TAKE ONE TABLET BY MOUTH ONCE DAILY BEFORE meal of choice 08/30/20  Yes Lorretta Harp, MD  Polyethyl Glycol-Propyl Glycol (SYSTANE) 0.4-0.3 % SOLN Place 1 drop into both eyes 2 (two) times daily as needed (dry eyes).   Yes [provider]  pravastatin (PRAVACHOL) 10 MG tablet Take 10 mg by mouth every evening.   Yes [provider]  SUMAtriptan (IMITREX) 100 MG tablet Take 100 mg by mouth every 2 (two) hours as needed for migraine. May repeat in 2 hours if headache persists or recurs.   Yes [provider]  torsemide (DEMADEX) 10 MG tablet Take 2 tablets (20 mg total) by mouth every morning. 04/19/21  Yes Burnell Blanks, MD  triamcinolone cream (KENALOG) 0.1 % Apply 1 application topically daily as needed for rash. 04/03/21  Yes [provider]         Current Outpatient Medications  Medication Sig Dispense Refill   albuterol  (VENTOLIN HFA) 108 (90 Base) MCG/ACT inhaler Inhale 2 puffs into the lungs every 6 (six) hours as needed for wheezing or shortness of breath.     amiodarone (PACERONE) 200 MG tablet Take 1/2 tablet by mouth daily only on Monday, Wednesday, Friday and Saturday     apixaban (ELIQUIS) 2.5 MG TABS tablet Take 1 tablet (2.5 mg total) by mouth 2 (two) times daily. 60 tablet 11   azelastine (ASTELIN) 0.1 % nasal spray Place 2 sprays into both nostrils daily as needed for allergies.     b complex vitamins capsule Take 1 capsule by mouth daily.     Calcium Carbonate-Vit D-Min (CALTRATE MINIS PLUS MINERALS PO) Take 40 mcg by mouth every morning.     cholecalciferol (VITAMIN D) 25 MCG (1000 UNIT) tablet Take 1,000 Units by mouth every evening.     FARXIGA 10 MG TABS tablet Take 10 mg by mouth daily.     Fe Fum-FA-B Cmp-C-Zn-Mg-Mn-Cu (HEMOCYTE PLUS) 106-1 MG CAPS Take 1 capsule by mouth daily.     ipratropium (ATROVENT) 0.06 % nasal spray 2 sprays in each nostril every 6 hours if needed 15 mL 5   ipratropium-albuterol (DUONEB) 0.5-2.5 (3) MG/3ML SOLN Inhale 3 mLs into the lungs every 6 (six) hours as needed (shortness of breath or wheezing).     metoprolol tartrate (LOPRESSOR) 25 MG tablet Take 1 tablet (25 mg total) by mouth 2 (two) times daily. 180 tablet 3   multivitamin-lutein (OCUVITE-LUTEIN) CAPS capsule Take 1 capsule by mouth daily.     pantoprazole (PROTONIX) 40 MG tablet TAKE ONE TABLET BY MOUTH ONCE DAILY BEFORE meal of choice 90 tablet 2   Polyethyl Glycol-Propyl Glycol (SYSTANE) 0.4-0.3 % SOLN Place 1 drop into both eyes 2 (two) times daily as needed (dry eyes).     pravastatin (PRAVACHOL) 10 MG tablet Take 10 mg by mouth every evening.     SUMAtriptan (IMITREX) 100 MG tablet Take 100 mg by mouth every 2 (two) hours as needed for migraine. May repeat in 2 hours if headache persists or recurs.     torsemide (DEMADEX) 10 MG tablet Take 2 tablets (20 mg total) by mouth every morning. 90 tablet 1    triamcinolone cream (KENALOG) 0.1 % Apply 1 application topically daily as needed for rash.     No current facility-administered medications for this visit.        Allergies  Allergen  Reactions   Other Nausea And Vomiting    Headache also  Also from artificial sweeetners   Codeine Nausea And Vomiting   Review of Systems:  General: normal appetite, + decreased energy, no weight gain, no weight loss, no fever  Cardiac: + chest tightness with exertion, no chest pain at rest, +SOB with exertion, no resting SOB, no PND, no orthopnea, no palpitations, no arrhythmia, + atrial fibrillation, + LE edema, no dizzy spells, no syncope  Respiratory: + shortness of breath, no home oxygen, no productive cough, no dry cough, no bronchitis, no wheezing, no hemoptysis, no asthma, no pain with inspiration or cough, no sleep apnea, no CPAP at night  GI: no difficulty swallowing, n reflux, ono frequent heartburn, no hiatal hernia, no abdominal pain, no constipation, no diarrhea, no hematochezia, no hematemesis, no melena  GU: no dysuria, no frequency, no urinary tract infection, no hematuria, no kidney stones, + kidney disease  Vascular: no pain suggestive of claudication, no pain in feet, no leg cramps, no varicose veins, no DVT, no non-healing foot ulcer  Neuro: no stroke, no TIA's, no seizures, no headaches, no temporary blindness one eye, no slurred speech, no peripheral neuropathy, no chronic pain, no instability of gait, no memory/cognitive dysfunction  Musculoskeletal: no arthritis, no joint swelling, no myalgias, no difficulty walking, mormal mobility  Skin: no rash, no itching, no skin infections, no pressure sores or ulcerations  Psych: no anxiety, no depression, no nervousness, no unusual recent stress  Eyes: no blurry vision, no floaters, no recent vision changes, + wears glasses or contacts  ENT: + hearing loss, no loose or painful teeth, no dentures, last saw dentist 04/2021  Hematologic: no easy  bruising, no abnormal bleeding, no clotting disorder, no frequent epistaxis  Endocrine: no diabetes, does not check CBG's at home  Physical Exam:  BP (!) 104/44 (BP Location: Right Arm, Patient Position: Sitting)  Pulse (!) 55  Resp 20  Ht 5\' 1"  (1.549 m)  SpO2 95%  BMI 20.78 kg/m  General: Thin, well-appearing  HEENT: Unremarkable, NCAT, PERLA, EOMI  Neck: no JVD, no bruits, no adenopathy  Chest: clear to auscultation, symmetrical breath sounds, no wheezes, no rhonchi  CV: RRR, 3/6 systolic murmur RSB, no diastolic murmur  Abdomen: soft, non-tender, no masses  Extremities: warm, well-perfused, pulses palpable at ankle, no lower extremity edema  Rectal/GU Deferred  Neuro: Grossly non-focal and symmetrical throughout  Skin: Clean and dry, no rashes, no breakdown  Diagnostic Tests:  ECHOCARDIOGRAM REPORT     Patient Name: [name] [name] Date of Exam: 03/06/2021  Medical Rec #: [id number] Height: 61.0 in  Accession #: [id number] Weight: 117.0 lb  Date of Birth: May 27, 1941 BSA: 1.504 m  Patient Age: 20 years BP: 110/62 mmHg  Patient Gender: F HR: 49 bpm.  Exam Location: [address]   Procedure: 2D Echo, Cardiac Doppler and Color Doppler   Indications: Z95.2 AVR   History: Patient has prior history of Echocardiogram examinations,  most  recent 07/19/2020. H/o AVR, Arrythmias:Atrial  Fibrillation; Risk  Factors:Hypertension and Dyslipidemia.   Sonographer: Coralyn Helling RDCS  Referring Phys: [place]    1. Left ventricular ejection fraction, by estimation, is 55 to 60%. The  left ventricle has normal function. The left ventricle has no regional  wall motion abnormalities. There is moderate left ventricular hypertrophy.  Left ventricular diastolic  parameters are consistent with Grade II diastolic dysfunction  (pseudonormalization).  2. Right ventricular systolic function is mildly  reduced. The right  ventricular size is normal. There is  normal pulmonary artery systolic  pressure. The estimated right ventricular systolic pressure is 82.4 mmHg.  3. Left atrial size was mild to moderately dilated.  4. Right atrial size was mildly dilated.  5. The mitral valve is normal in structure. Mild mitral valve  regurgitation. No evidence of mitral stenosis.  6. There is a bioprosthetic aortic valve, the valve is heavily calcified.  Elevated mean gradient 28 mmHg. There is at least moderate eccentric  valvular regurgitation. There is not holodiastolic flow reversal present  in the descending thoracic aorta.  There is some restriction to valve opening due to calcification, but  suspect significant AI contributes to the mean gradient. Would consider  assessment by TEE.  7. Aortic dilatation noted. There is mild dilatation of the aortic root,  measuring 40 mm.  8. The inferior vena cava is normal in size with greater than 50%  respiratory variability, suggesting right atrial pressure of 3 mmHg.   FINDINGS  Left Ventricle: Left ventricular ejection fraction, by estimation, is 55  to 60%. The left ventricle has normal function. The left ventricle has no  regional wall motion abnormalities. The left ventricular internal cavity  size was normal in size. There is  moderate left ventricular hypertrophy. Left ventricular diastolic  parameters are consistent with Grade II diastolic dysfunction  (pseudonormalization).   Right Ventricle: The right ventricular size is normal. No increase in  right ventricular wall thickness. Right ventricular systolic function is  mildly reduced. There is normal pulmonary artery systolic pressure. The  tricuspid regurgitant velocity is 2.87  m/s, and with an assumed right atrial pressure of 3 mmHg, the estimated  right ventricular systolic pressure is 23.5 mmHg.   Left Atrium: Left atrial size was mild to moderately dilated.   Right Atrium: Right atrial size was mildly dilated.   Pericardium: There is no  evidence of pericardial effusion.   Mitral Valve: The mitral valve is normal in structure. There is mild  calcification of the mitral valve leaflet(s). Mild mitral annular  calcification. Mild mitral valve regurgitation. No evidence of mitral  valve stenosis.   Tricuspid Valve: The tricuspid valve is normal in structure. Tricuspid  valve regurgitation is trivial.   Aortic Valve: There is a bioprosthetic aortic valve, the valve is heavily  calcified. Elevated mean gradient 28 mmHg. There is at least moderate  eccentric valvular regurgitation. There is not holodiastolic flow reversal  present in the descending thoracic  aorta. There is some restriction to valve opening due to calcification,  but suspect significant AI contributes to the mean gradient. Would  consider assessment by TEE. The aortic valve has been repaired/replaced.  Aortic valve regurgitation is moderate.  Aortic regurgitation PHT measures 390 msec. Aortic valve mean gradient  measures 26.6 mmHg. Aortic valve peak gradient measures 49.1 mmHg. Aortic  valve area, by VTI measures 0.90 cm.   Pulmonic Valve: The pulmonic valve was normal in structure. Pulmonic valve  regurgitation is trivial.   Aorta: Aortic dilatation noted. There is mild dilatation of the aortic  root, measuring 40 mm.   Venous: The inferior vena cava is normal in size with greater than 50%  respiratory variability, suggesting right atrial pressure of 3 mmHg.   IAS/Shunts: No atrial level shunt detected by color flow Doppler.    LEFT VENTRICLE  PLAX 2D  LVIDd: 4.80 cm Diastology  LVIDs: 3.30 cm LV e' medial: 4.03 cm/s  LV PW: 1.40 cm LV E/e'  medial: 19.7  LV IVS: 1.70 cm LV e' lateral: 9.28 cm/s  LVOT diam: 2.10 cm LV E/e' lateral: 8.5  LV SV: 92  LV SV Index: 61  LVOT Area: 3.46 cm    RIGHT VENTRICLE IVC  RV S prime: 8.92 cm/s IVC diam: 1.90 cm  TAPSE (M-mode): 1.4 cm  RVSP: 35.9 mmHg   LEFT ATRIUM Index RIGHT ATRIUM Index  LA diam:  3.50 cm 2.33 cm/m RA Pressure: 3.00 mmHg  LA Vol (A2C): 62.2 ml 41.36 ml/m RA Area: 15.80 cm  LA Vol (A4C): 56.6 ml 37.64 ml/m RA Volume: 41.30 ml 27.46 ml/m  LA Biplane Vol: 59.4 ml 39.50 ml/m  AORTIC VALVE  AV Area (Vmax): 0.87 cm  AV Area (Vmean): 0.91 cm  AV Area (VTI): 0.90 cm  AV Vmax: 350.20 cm/s  AV Vmean: 239.000 cm/s  AV VTI: 1.020 m  AV Peak Grad: 49.1 mmHg  AV Mean Grad: 26.6 mmHg  LVOT Vmax: 87.46 cm/s  LVOT Vmean: 62.860 cm/s  LVOT VTI: 0.264 m  LVOT/AV VTI ratio: 0.26  AI PHT: 390 msec   AORTA  Ao Root diam: 4.00 cm  Ao Asc diam: 3.10 cm   MITRAL VALVE TRICUSPID VALVE  MV Area (PHT): 4.32 cm TR Peak grad: 32.9 mmHg  MV Decel Time: 176 msec TR Vmax: 287.00 cm/s  MV E velocity: 79.34 cm/s Estimated RAP: 3.00 mmHg  MV A velocity: 48.76 cm/s RVSP: 35.9 mmHg  MV E/A ratio: 1.63  SHUNTS  Systemic VTI: 0.26 m  Systemic Diam: 2.10 cm   Loralie Champagne MD  Electronically signed by Loralie Champagne MD  Signature Date/Time: 03/06/2021/4:37:55 PM     Final  TRANSESOPHOGEAL ECHO REPORT     Patient Name: DORELLA LASTER Date of Exam: 03/27/2021  Medical Rec #: [id number] Height: 61.0 in  Accession #: [id number] Weight: 116.4 lb  Date of Birth: November 25, 1940 BSA: 1.501 m  Patient Age: 59 years BP: 143/55 mmHg  Patient Gender: F HR: 47 bpm.  Exam Location: Outpatient   Procedure: Transesophageal Echo, Cardiac Doppler and Color Doppler   Indications: I35.2 Nonrheumatic aortic (valve) stenosis with  insufficiency   History: Patient has prior history of Echocardiogram examinations,  most  recent 03/06/2021. Aortic Valve Disease; Risk  Factors:Hypertension and Dyslipidemia.  Aortic Valve: 21 mm Edwards Perimount L7200 bioprosthetic  valve  is present in the aortic position.   Sonographer: Tiffany Dance RVT  Referring Phys: [id number]  Diagnosing Phys: Sanda Klein MD   PROCEDURE: The transesophogeal probe was passed without difficulty through   the esophogus of the patient. Sedation performed by different physician.  The patient was monitored while under deep sedation. Anesthestetic  sedation was provided intravenously by  Anesthesiology: 68.64mg  of Propofol. The patient developed no  complications during the procedure.   IMPRESSIONS    1. Left ventricular ejection fraction, by estimation, is 60 to 65%. The  left ventricle has normal function. The left ventricle has no regional  wall motion abnormalities. Left ventricular diastolic parameters are  consistent with Grade II diastolic  dysfunction (pseudonormalization). Elevated left atrial pressure.  2. Right ventricular systolic function is mildly reduced. The right  ventricular size is normal. There is moderately elevated pulmonary artery  systolic pressure.  3. Left atrial size was mildly dilated. No left atrial/left atrial  appendage thrombus was detected.  4. Right atrial size was mildly dilated.  5. The mitral valve is normal in structure. Mild to moderate mitral valve  regurgitation.  6. The  aortic valve has been repaired/replaced. There is moderate  calcification of the aortic valve. There is moderate thickening of the  aortic valve. Aortic valve regurgitation is moderate to severe and is  intraanular (there is no perivalvular leak).  Mild aortic valve stenosis. There is a 21 mm Edwards bioprosthetic valve  present in the aortic position. Aortic regurgitation PHT measures 336  msec. Aortic valve mean gradient measures 13.4 mmHg. Aortic valve Vmax  measures 2.57 m/s. Aortic valve  acceleration time measures 114 msec.   FINDINGS  Left Ventricle: Left ventricular ejection fraction, by estimation, is 60  to 65%. The left ventricle has normal function. The left ventricle has no  regional wall motion abnormalities. The left ventricular internal cavity  size was normal in size. There is  no left ventricular hypertrophy. Abnormal (paradoxical) septal motion  consistent  with post-operative status. Left ventricular diastolic  parameters are consistent with Grade II diastolic dysfunction  (pseudonormalization).   Right Ventricle: The right ventricular size is normal. No increase in  right ventricular wall thickness. Right ventricular systolic function is  mildly reduced. There is moderately elevated pulmonary artery systolic  pressure. The tricuspid regurgitant  velocity is 3.81 m/s, and with an assumed right atrial pressure of 5 mmHg,  the estimated right ventricular systolic pressure is 37.6 mmHg.   Left Atrium: Left atrial size was mildly dilated. No left atrial/left  atrial appendage thrombus was detected.   Right Atrium: Right atrial size was mildly dilated.   Pericardium: There is no evidence of pericardial effusion.   Mitral Valve: The mitral valve is normal in structure. Mild mitral annular  calcification. Mild to moderate mitral valve regurgitation, with  centrally-directed jet.   Tricuspid Valve: The tricuspid valve is normal in structure. Tricuspid  valve regurgitation is mild.   Aortic Valve: Unable to get an adequate measurement of the LVOT VTI  profile. The aortic valve has been repaired/replaced. There is moderate  calcification of the aortic valve. There is moderate thickening of the  aortic valve. Aortic valve regurgitation is  moderate to severe. Aortic regurgitation PHT measures 336 msec. Mild  aortic stenosis is present. Aortic valve mean gradient measures 13.4 mmHg.  Aortic valve peak gradient measures 26.4 mmHg. There is a 21 mm Edwards  Perimount [id number] bioprosthetic valve  present in the aortic position.   Pulmonic Valve: The pulmonic valve was normal in structure. Pulmonic valve  regurgitation is trivial.   Aorta: The aortic root, ascending aorta and aortic arch are all  structurally normal, with no evidence of dilitation or obstruction. There  is minimal (Grade I) layered plaque.   IAS/Shunts: No atrial level shunt  detected by color flow Doppler.    AORTIC VALVE  AV Vmax: 257.02 cm/s  AV Vmean: 169.276 cm/s  AV VTI: 0.711 m  AV Peak Grad: 26.4 mmHg  AV Mean Grad: 13.4 mmHg  AI PHT: 336 msec   MITRAL VALVE TRICUSPID VALVE  MV Area (PHT): 3.70 cm TR Peak grad: 58.1 mmHg  MV Decel Time: 205 msec TR Vmax: 381.05 cm/s  MV E velocity: 86.80 cm/s  MV A velocity: 60.90 cm/s  MV E/A ratio: 1.43   Mihai Croitoru MD  Electronically signed by Sanda Klein MD  Signature Date/Time: 03/27/2021/12:52:26 PM     Final  Physicians  Panel Physicians Referring Physician Case Authorizing Physician  Burnell Blanks, MD (Primary)    Procedures  RIGHT HEART CATH AND CORONARY ANGIOGRAPHY  Conclusion   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  No angiographic evidence of CAD Elevated right heart pressures Recommendations: Will continue workup for TAVR. Will increase Torsemide to 20 mg daily. BMET next week then proceed with pre-TAVR CT scans. Resume Eliquis tomorrow.  Indications  History of aortic valve replacement with bioprosthetic valve [Z95.3 (ICD-10-CM)]  Nonrheumatic aortic valve insufficiency [I35.1 (ICD-10-CM)]  Procedural Details  Technical Details Indication: Bioprosthetic AVR in 2004, now with severe AI and moderate AS. Planning for TAVR  Operator: Dr. Lenna Sciara Assisting: Dr. Angelena Form  Procedure: The risks, benefits, complications, treatment options, and expected outcomes were discussed with the patient. The patient and/or family concurred with the proposed plan, giving informed consent. The patient was brought to the cath lab after IV hydration was given. The patient was sedated with Versed and Fentanyl. The IV catheter present in the right antecubital vein was changed for a 6 French sheath. Right heart catheterization performed with a balloon tipped catheter. The right wrist was prepped and draped in a sterile fashion. 1% lidocaine was used for local  anesthesia. Using the modified Seldinger access technique, a 6 French Slender sheath was placed in the right radial artery. 3 mg Verapamil was given through the sheath. 4000 units IV heparin was given. Standard diagnostic catheters were used to perform selective coronary angiography. A pigtail catheter was used to perform a left ventricular angiogram. The sheath was removed from the right radial artery and a Terumo hemostasis band was applied at the arteriotomy site on the right wrist.      Estimated blood loss <50 mL.   During this procedure medications were administered to achieve and maintain moderate conscious sedation while the patient's heart rate, blood pressure, and oxygen saturation were continuously monitored and I was present face-to-face 100% of this time.  Medications  (Filter: Administrations occurring from 0925 to 1019 on 04/19/21)  Heparin (Porcine) in NaCl 1000-0.9 UT/500ML-% SOLN (mL)  Total volume: 1,000 mL  Date/Time Rate/Dose/Volume Action   04/19/21 0929 500 mL Given   0929 500 mL Given   midazolam (VERSED) injection (mg)  Total dose: 1 mg  Date/Time Rate/Dose/Volume Action   04/19/21 0931 1 mg Given   fentaNYL (SUBLIMAZE) injection (mcg)  Total dose: 25 mcg  Date/Time Rate/Dose/Volume Action   04/19/21 0932 25 mcg Given   lidocaine (PF) (XYLOCAINE) 1 % injection (mL)  Total volume: 4 mL  Date/Time Rate/Dose/Volume Action   04/19/21 0938 2 mL Given   0941 2 mL Given   Radial Cocktail/Verapamil only (mL)  Total volume: 10 mL  Date/Time Rate/Dose/Volume Action   04/19/21 0941 10 mL Given   heparin sodium (porcine) injection (Units)  Total dose: 4,000 Units  Date/Time Rate/Dose/Volume Action   04/19/21 0943 4,000 Units Given   iohexol (OMNIPAQUE) 350 MG/ML injection (mL)  Total volume: 70 mL  Date/Time Rate/Dose/Volume Action   04/19/21 1013 70 mL Given   Sedation Time  Sedation Time Physician-1: 36 minutes 25 seconds  Contrast  Medication Name Total Dose   iohexol (OMNIPAQUE) 350 MG/ML injection 70 mL  Radiation/Fluoro  Fluoro time: 5.8 (min)  DAP: 9038 (mGycm2)  Cumulative Air Kerma: 132 (mGy)  Coronary Findings  Diagnostic  Dominance: Right  Left Anterior Descending  Vessel is large. Vessel is angiographically normal.  Left Circumflex  Vessel is large. Vessel is angiographically normal.  Right Coronary Artery  Vessel is large. Vessel is angiographically normal.  Intervention  No interventions have been documented.  Right Heart  Right Heart Pressures Hemodynamic findings consistent with moderate pulmonary hypertension.  Right Atrium Right atrial pressure is elevated.  Coronary Diagrams  Diagnostic  Dominance: Right  Intervention  Implants  No implant documentation for this case.   Syngo Images  Show images for CARDIAC CATHETERIZATION  Images on Long Term Storage  Show images for Frankowski, Arriona F Link to Procedure Log    Procedure Log  Hemo Data  Flowsheet Row Most Recent Value  Fick Cardiac Output 5.64 L/min  Fick Cardiac Output Index 3.84 (L/min)/BSA  RA A Wave 15 mmHg  RA V Wave 15 mmHg  RA Mean 13 mmHg  RV Systolic Pressure 69 mmHg  RV Diastolic Pressure 9 mmHg  RV EDP 15 mmHg  PA Systolic Pressure 70 mmHg  PA Diastolic Pressure 23 mmHg  PA Mean 38 mmHg  PW A Wave 24 mmHg  PW V Wave 45 mmHg  PW Mean 26 mmHg  AO Systolic Pressure 536 mmHg  AO Diastolic Pressure 43 mmHg  AO Mean 69 mmHg  QP/QS 0.65  TPVR Index 9.88 HRUI  TSVR Index 17.94 HRUI  PVR SVR Ratio 0.21  TPVR/TSVR Ratio 0.55   ADDENDUM REPORT: 05/28/2021 09:59  ADDENDUM:  Extracardiac findings will be described separately under dictation  for contemporaneously obtained CTA chest, abdomen and pelvis dated  05/25/2021. Please see that report for full description of relevant  extracardiac findings.  Electronically Signed  By: Vinnie Langton M.D.  On: 05/28/2021 09:59   Addended by Etheleen Mayhew, MD on 05/28/2021 10:02 AM  Study Result   Narrative & Impression  CLINICAL DATA: Aortic stenosis and regurgitation, with prior  cardiothoracic surgeries in 2004 and 2013.  EXAM:  Cardiac TAVR CT  TECHNIQUE:  The patient was scanned on a Siemens Force 144 slice scanner. A 120  kV retrospective scan was triggered in the descending thoracic aorta  at 111 HU's. Gantry rotation speed was 270 msecs and collimation was  .9 mm. No beta blockade or nitro were given. The 3D data set was  reconstructed in 5% intervals of the R-R cycle. Systolic and  diastolic phases were analyzed on a dedicated work station using  MPR, MIP and VRT modes. The patient received 100 cc of contrast.  FINDINGS:  Prosthetic aortic Valve: There is a 21 mm Edwards Perimount 2700  Valve in the Aortic position with appropriate leaflet excursion and  mild thickening on the right and left leaflets  LVOT calcification: None  Annular calcification: None  Aortic Annulus Measurements (prosthetic)  Major annulus diameter: 20 mm  Minor annulus diameter:19 mm  Annular perimeter: 60 mm  Geometric orifice area: 2.7 cm2  Aortic Root Measurements  Sinus of Valsalva perimeter (native): 114 mm  Prosthetic SoV perimeter 69 mm  Sinotubular Junction: 25 mm  Ascending Thoracic Aorta: 29 mm  Aortic Arch: 42 mm  Descending Thoracic Aorta: 25 mm  There are invaginations into the ascending aorta without coarctation  likely reflective of prior ascending aortic surgical intervention.  Sinus of Valsalva Measurements:  Sinus to Commissure  Right coronary cusp width: 21 mm  Left coronary cusp width: 21 mm  Non coronary cusp width: 21 mm  Coronary Artery Height above Annulus:  Left Main: 18 mm  VTC: 12 mm  Right Coronary: 17 mm  VTC: 5 mm  Coronary Arteries:  Coronary Artery Calcium score: 0  Left main: 0  Left anterior descending artery: 0  Left circumflex artery: 0  Right coronary artery: 0  Total: 0  Percentile: 1st for age, sex, and race matched control.  Optimum  Fluoroscopic  Angle for Delivery: LAO 16 CAU 12  Valves for structural team consideration: 23 mm Edwards Sapien, 23  mm Evolut PRO  IMPRESSION:  1. Prosthetic aortic valve evaluation with findings pertinent to  Valve in Valve TAVR procedure are detailed above.  2. 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  RECOMMENDATIONS:  Coronary artery calcium (CAC) score is a strong predictor of  incident coronary heart disease (CHD) and provides predictive  information beyond traditional risk factors. CAC scoring is  reasonable to use in the decision to withhold, postpone, or initiate  statin therapy in intermediate-risk or selected borderline-risk  asymptomatic adults (age 28-75 years and LDL-C >=70 to <190 mg/dL)  who do not have diabetes or established atherosclerotic  cardiovascular disease (ASCVD).* In intermediate-risk (10-year ASCVD  risk >=7.5% to <20%) adults or selected borderline-risk (10-year  ASCVD risk >=5% to <7.5%) adults in whom a CAC score is measured for  the purpose of making a treatment decision the following  recommendations have been made:  If CAC = 0, it is reasonable to withhold statin therapy and reassess  in 5 to 10 years, as long as higher risk conditions are absent  (diabetes mellitus, family history of premature CHD in first degree  relatives (males <55 years; females <65 years), cigarette smoking,  LDL >=190 mg/dL or other independent risk factors).  If CAC is 1 to 99, it is reasonable to initiate statin therapy for  patients >=43 years of age.  If CAC is >=100 or >=75th percentile, it is reasonable to initiate  statin therapy at any age.  Cardiology referral should be considered for patients with CAC  scores >=400 or >=75th percentile.  *2018 AHA/ACC/AACVPR/AAPA/ABC/ACPM/ADA/AGS/APhA/ASPC/NLA/PCNA  Guideline on the Management of Blood Cholesterol: A Report of the  American College of Cardiology/American  Heart Association Task Force  on Clinical Practice Guidelines. J Am Coll Cardiol.  2019;73(24):3168-3209.  Mahesh Chandrasekhar  Electronically Signed:  By: Rudean Haskell M.D.  On: 05/27/2021 15:19   Narrative & Impression  CLINICAL DATA: 80 year old female with history of non rheumatic  aortic valve insufficiency. Preprocedural study prior to potential  transcatheter aortic valve replacement (TAVR) procedure.  EXAM:  CT ANGIOGRAPHY CHEST, ABDOMEN AND PELVIS  TECHNIQUE:  Multidetector CT imaging through the chest, abdomen and pelvis was  performed using the standard protocol during bolus administration of  intravenous contrast. Multiplanar reconstructed images and MIPs were  obtained and reviewed to evaluate the vascular anatomy.  CONTRAST: 157mL OMNIPAQUE IOHEXOL 350 MG/ML SOLN  COMPARISON: CTA of the chest, abdomen and pelvis 06/06/2020.  FINDINGS:  CTA CHEST FINDINGS  Cardiovascular: Heart size is normal. There is no significant  pericardial fluid, thickening or pericardial calcification.  Atherosclerotic calcifications in the thoracic aorta. No definite  coronary artery calcifications. Status post median sternotomy for  Bentall procedure with bioprosthetic aortic valve noted. 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  Mediastinum/Lymph Nodes: No pathologically enlarged mediastinal or  hilar lymph nodes. Esophagus is unremarkable in appearance. No  axillary lymphadenopathy.  Lungs/Pleura: There is a mosaic attenuation throughout the lung  parenchyma, suggesting areas of air trapping from small airways  disease. No acute consolidative airspace disease. No pleural  effusions. No suspicious appearing pulmonary nodules or masses are  noted  Musculoskeletal/Soft Tissues: Median sternotomy wires. There are no  aggressive appearing lytic or blastic lesions noted in the  visualized portions of the skeleton.  CTA ABDOMEN AND  PELVIS FINDINGS  Hepatobiliary: No suspicious cystic or solid hepatic lesions. No  intra or extrahepatic biliary ductal dilatation. Gallbladder is  normal in appearance.  Pancreas: No pancreatic mass. No pancreatic ductal dilatation. No  pancreatic or peripancreatic fluid collections or inflammatory  changes.  Spleen: Unremarkable.  Adrenals/Urinary Tract: Low-attenuation lesions in both kidneys  measuring up to 1.7 cm in the upper pole of the left kidney,  compatible with simple cysts. No suspicious renal lesions. No  hydroureteronephrosis. Urinary bladder is normal in appearance.  Bilateral adrenal glands are normal in appearance.  Stomach/Bowel: The appearance of the stomach is normal. There is no  pathologic dilatation of small bowel or colon. Numerous colonic  diverticulae are noted, particularly in the sigmoid colon, without  surrounding inflammatory changes to suggest an acute diverticulitis  at this time. The appendix is not confidently identified and may be  surgically absent. Regardless, there are no inflammatory changes  noted adjacent to the cecum to suggest the presence of an acute  appendicitis at this time.  Vascular/Lymphatic: Aortic atherosclerosis, with vascular findings  and measurements pertinent to potential TAVR procedure, as detailed  below. No aneurysm or dissection noted in the abdominal or pelvic  vasculature. No lymphadenopathy noted in the abdomen or pelvis.  Reproductive: Status post hysterectomy. Ovaries are not confidently  identified may be surgically absent or atrophic.  Other: No significant volume of ascites. No pneumoperitoneum.  Musculoskeletal: There are no aggressive appearing lytic or blastic  lesions noted in the visualized portions of the skeleton.  VASCULAR MEASUREMENTS PERTINENT TO TAVR:  AORTA:  Minimal Aortic Diameter-13 x 12 mm  Severity of Aortic Calcification-mild  RIGHT PELVIS:  Right Common Iliac Artery -  Minimal Diameter-9.3 x  8.2 mm  Tortuosity-mild  Calcification-mild  Right External Iliac Artery -  Minimal Diameter-7.4 x 7.3 mm  Tortuosity-severe  Calcification-none  Right Common Femoral Artery -  Minimal Diameter-6.6 x 7.0 mm  Tortuosity-mild  Calcification-none  LEFT PELVIS:  Left Common Iliac Artery -  Minimal Diameter-9.7 x 8.9 mm  Tortuosity-severe  Calcification-mild  Left External Iliac Artery -  Minimal Diameter-7.7 x 7.4 mm  Tortuosity-moderate  Calcification-none  Left Common Femoral Artery -  Minimal Diameter-7.5 x 7.7 mm  Tortuosity-mild  Calcification-none  Review of the MIP images confirms the above findings.  IMPRESSION:  1. Vascular findings and measurements pertinent to potential TAVR  procedure, as detailed above.  2. Status post Bentall procedure. 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 Recommend  semi-annual imaging followup by CTA or MRA and referral to  cardiothoracic surgery if not already obtained. This recommendation  follows 2010 ACCF/AHA/AATS/ACR/ASA/SCA/SCAI/SIR/STS/SVM Guidelines  for the Diagnosis and Management of Patients With Thoracic Aortic  Disease. Circulation. 2010; 121: D782-U23. Aortic aneurysm NOS  (ICD10-I71.9).  3. The appearance of the lungs suggests air trapping from small  airways disease.  4. Aortic atherosclerosis.  5. Colonic diverticulosis without evidence of acute diverticulitis  at this time.  6. Additional incidental findings, as above.  Electronically Signed  By: Vinnie Langton M.D.  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 I have personally reviewed her 2D echo, TEE, cardiac catheterization, and CTA studies. TEE shows a calcified aortic valve prosthesis with restricted leaflet mobility and severe regurgitation with a pressure half-time of 336 ms. The mean gradient is 13.4 mmHg. Left ventricular ejection fraction is normal with grade 2 diastolic dysfunction. There is mild to moderate mitral regurgitation with a centrally directed jet. Cardiac catheterization shows no coronary disease with moderate pulmonary hypertension with a PA pressure of 70/23 and a mean pulmonary wedge pressure of 26 with a V wave of 45. I agree that aortic valve replacement is indicated in this patient for relief of her progressive symptoms and to prevent left ventricular deterioration. Given her advanced age and prior cardiac surgery I think the best option for treating her is valve in valve TAVR. Her 21 mm prosthetic valve is suitable for a 23 mm Edwards SAPIEN 3 valve. Her abdominal and pelvic CTA shows adequate pelvic vascular access to allow transfemoral insertion.  The patient and her son were counseled at length regarding treatment alternatives for management of severe symptomatic aortic stenosis. The risks and benefits of surgical intervention has been discussed in detail. Long-term prognosis with medical therapy was discussed. Alternative approaches such as conventional surgical aortic valve replacement, transcatheter aortic valve replacement, and palliative medical therapy were compared and contrasted at length. This discussion was placed in the context of the patient's own specific clinical presentation and past medical history. All of their questions have been addressed.  Following the decision to proceed with transcatheter aortic valve replacement, a  discussion was held regarding what types of management strategies would be attempted intraoperatively in the event of life-threatening complications, including whether or not the patient would be considered a candidate for the use of cardiopulmonary bypass and/or conversion to open sternotomy for attempted surgical intervention. Given her age, stage 3-4 CKD and prior sternotomy for Bentall procedure I do not think she would be a candidate for emergent sternotomy to manage any intraoperative complications. The patient is aware of the fact that transient use of cardiopulmonary bypass may be necessary. The patient has been advised of a variety of complications that might develop including but not limited to risks of death, stroke, paravalvular leak, aortic dissection or other major vascular complications, aortic annulus rupture, device embolization, cardiac rupture or perforation, mitral regurgitation, acute myocardial infarction, arrhythmia, heart block or bradycardia requiring permanent pacemaker placement, congestive heart failure, respiratory failure, renal failure, pneumonia, infection, other late complications related to structural valve deterioration or migration, or other complications that might ultimately cause a temporary or permanent loss of functional independence or other long term morbidity. The patient provides full informed consent for the procedure as described and all questions were answered.   Plan:   Valve in valve TAVR using a SAPIEN 3 valve.  Gaye Pollack, MD

## 2021-06-18 NOTE — Anesthesia Preprocedure Evaluation (Addendum)
Anesthesia Evaluation  Patient identified by MRN, date of birth, ID band Patient awake    Reviewed: Allergy & Precautions, NPO status , Patient's Chart, lab work & pertinent test results  History of Anesthesia Complications Negative for: history of anesthetic complications  Airway Mallampati: II  TM Distance: >3 FB Neck ROM: Full    Dental  (+) Dental Advisory Given, Teeth Intact   Pulmonary asthma ,    Pulmonary exam normal        Cardiovascular + dysrhythmias Atrial Fibrillation + Valvular Problems/Murmurs (s/p AVR 2004) MR and AI  Rhythm:Regular Rate:Normal + Systolic murmurs  '22 TEE - EF 60 to 65%. Grade II diastolic dysfunction (pseudonormalization). Right ventricular systolic function is mildly reduced. There is moderately elevated pulmonary artery systolic pressure. Left atrial size was mildly dilated. RA size was mildly dilated. Mild to moderate mitral valve regurgitation. The aortic valve has been repaired/replaced. Aortic valve regurgitation is moderate to severe and is intraanular (there is no perivalvular leak). Mild aortic valve stenosis. There is a 21 mm Edwards bioprosthetic valve present in the aortic position. Aortic regurgitation PHT measures 336  msec. Aortic valve mean gradient measures 13.4 mmHg. Aortic valve Vmax measures 2.57 m/s. Aortic valve acceleration time measures 114 msec.   No CAD or carotid stenosis on investigations    Neuro/Psych  Headaches, negative psych ROS   GI/Hepatic Neg liver ROS, GERD  Controlled and Medicated,  Endo/Other  negative endocrine ROS  Renal/GU CRFRenal disease     Musculoskeletal  (+) Arthritis ,   Abdominal   Peds  Hematology  (+) anemia ,  On eliquis    Anesthesia Other Findings   Reproductive/Obstetrics                            Anesthesia Physical Anesthesia Plan  ASA: 3  Anesthesia Plan: MAC   Post-op Pain Management:     Induction:   PONV Risk Score and Plan: 2 and Propofol infusion and Treatment may vary due to age or medical condition  Airway Management Planned: Nasal Cannula and Natural Airway  Additional Equipment: None  Intra-op Plan:   Post-operative Plan:   Informed Consent: I have reviewed the patients History and Physical, chart, labs and discussed the procedure including the risks, benefits and alternatives for the proposed anesthesia with the patient or authorized representative who has indicated his/her understanding and acceptance.       Plan Discussed with: CRNA and Anesthesiologist  Anesthesia Plan Comments:       Anesthesia Quick Evaluation

## 2021-06-18 NOTE — Progress Notes (Signed)
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  History includes never smoker, murmur/AV disease (s/p porcine 21 mm aVR 08/10/02 for severe AS; moderate-severe AR, mild AS 03/27/21), ascending thoracic aorta (s/p resection and grafting of TAA with 30 mm Hemashield straight graft 07/07/12; 4.0 cm aortic arch 05/25/21 CTA), aflutter (post-TAA repair, s/p DCCV 1/44/31 complicated by post-cardioversion asystole requiring temporary pacing; s/p DCCV 06/05/20), HLD, dyspnea, CKD (stage IV), anemia, GERD.  Reported last Eliquis 06/13/21.  06/15/2021 presurgical COVID-19 test negative.  Anesthesia team to evaluate on the day of surgery.   VS: BP (!) 118/55   Pulse (!) 55   Temp 36.6 C (Oral)   Resp 18   Ht 5\' 1"  (1.549 m)   Wt 49.9 kg   SpO2 99%   BMI 20.77 kg/m   PROVIDERS: [name], MD is PCP  Quay Burow, MD is primary cardiologist Malka So, PA is [facility] provider Lauree Chandler, MD is structural heart cardiologist   LABS: Labs reviewed: Acceptable for surgery. Cr 2.02, but consistent with previous results. H/H 10/32.1, also consistent with previous results. BNP 2,830.7.  (all labs ordered are listed, but only abnormal results are displayed)  Labs Reviewed  CBC - Abnormal; Notable for the following components:      Result Value   RBC 3.23 (*)    Hemoglobin 10.0 (*)    HCT 32.1 (*)    All other components within normal limits  COMPREHENSIVE METABOLIC PANEL - Abnormal; Notable for the following components:   Sodium 134 (*)    BUN 29 (*)     Creatinine, Ser 2.02 (*)    GFR, Estimated 24 (*)    All other components within normal limits  URINALYSIS, ROUTINE W REFLEX MICROSCOPIC - Abnormal; Notable for the following components:   Color, Urine STRAW (*)    Specific Gravity, Urine 1.004 (*)    Glucose, UA 50 (*)    All other components within normal limits  BLOOD GAS, ARTERIAL - Abnormal; Notable for the following components:   pO2, Arterial 77.6 (*)    Allens test (pass/fail) BRACHIAL ARTERY (*)    All other components within normal limits  BRAIN NATRIURETIC PEPTIDE - Abnormal; Notable for the following components:   B Natriuretic Peptide 2,830.7 (*)    All other components within normal limits  SARS CORONAVIRUS 2 (TAT 6-24 HRS)  SURGICAL PCR SCREEN  PROTIME-INR  TYPE AND SCREEN    Preoperative CXR and EKG noted.    CV: CT Coronary 05/25/21: IMPRESSION: 1. Prosthetic aortic valve evaluation with findings pertinent to Valve in Valve TAVR procedure are detailed above. [See full report] 2. 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    Cardiac cath 04/19/21:   Hemodynamic findings consistent with moderate pulmonary hypertension.  No angiographic evidence of CAD Elevated right heart pressures  Recommendations: Will continue workup for TAVR. Will increase Torsemide to 20 mg daily. BMET next week then proceed with pre-TAVR CT scans. Resume Eliquis tomorrow.     TEE 03/27/21: IMPRESSIONS   1.  Left ventricular ejection fraction, by estimation, is 60 to 65%. The  left ventricle has normal function. The left ventricle has no regional  wall motion abnormalities. Left ventricular diastolic parameters are  consistent with Grade II diastolic  dysfunction (pseudonormalization). Elevated left atrial pressure.   2. Right ventricular systolic function is mildly reduced. The right  ventricular size is normal. There is moderately elevated pulmonary artery  systolic pressure.    3. Left atrial size was mildly dilated. No left atrial/left atrial  appendage thrombus was detected.   4. Right atrial size was mildly dilated.   5. The mitral valve is normal in structure. Mild to moderate mitral valve  regurgitation.   6. The aortic valve has been repaired/replaced. There is moderate  calcification of the aortic valve. There is moderate thickening of the  aortic valve. Aortic valve regurgitation is moderate to severe and is  intraanular (there is no perivalvular leak).  Mild aortic valve stenosis. There is a 21 mm Edwards bioprosthetic valve  present in the aortic position. Aortic regurgitation PHT measures 336  msec. Aortic valve mean gradient measures 13.4 mmHg. Aortic valve Vmax  measures 2.57 m/s. Aortic valve  acceleration time measures 114 msec.    Carotid US 03/19/21: Summary:  Right Carotid: The extracranial vessels were near-normal with only minimal wall thickening or plaque.  Left Carotid: The extracranial vessels were near-normal with only minimal wall thickening or plaque. The bilateral ICA's are noted to be tortuous.  Vertebrals:  Bilateral vertebral arteries demonstrate antegrade flow.  Subclavians: Normal flow hemodynamics were seen in bilateral subclavian arteries.    Past Medical History:  Diagnosis Date   Anemia    Aortic root dilatation ([place]) 05/11/2012   CTA CHEST   Arthritis    Cardiac asystole, post DCCV requiring use of temp. external pacemaker leads 07/19/2012   Chronic kidney disease    Stage 4   Dyspnea    Dysrhythmia    GERD (gastroesophageal reflux disease)    H/O aortic valve disorder 10/04/2002   Dr. Tharon Aquas Trigt   Headache(784.0)    Heart murmur    History of migraine headaches    Hyperlipidemia    on statin therapy   Migraines    Thoracic ascending aortic aneurysm    CTA CHEST 05/11/12    Past Surgical History:  Procedure Laterality Date   ABDOMINAL HYSTERECTOMY     AORTIC VALVE REPLACEMENT  10/04/2002    Dr.  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   LEFT AND RIGHT HEART CATHETERIZATION WITH CORONARY ANGIOGRAM N/A 06/18/2012   Procedure: LEFT AND RIGHT HEART CATHETERIZATION WITH CORONARY ANGIOGRAM;  Surgeon: Lorretta Harp, MD;  Location: [facility] CATH LAB;  Service: Cardiovascular;  Laterality: N/A;   NM MYOCAR PERF WALL MOTION  10/29/2010   normal   REPLACEMENT ASCENDING AORTA  07/07/2012   Procedure: REPLACEMENT ASCENDING AORTA;  Surgeon: Ivin Poot, MD;  Location: [place];  Service: Open Heart Surgery;  Laterality: N/A;  CIRCULATORY ARREST   RIGHT HEART CATH AND CORONARY ANGIOGRAPHY N/A 04/19/2021   Procedure: RIGHT HEART CATH AND CORONARY ANGIOGRAPHY;  Surgeon: Burnell Blanks, MD;  Location: [place] CV LAB;  Service: Cardiovascular;  Laterality: N/A;   TEE WITHOUT CARDIOVERSION  06/18/2012   Procedure: TRANSESOPHAGEAL ECHOCARDIOGRAM (TEE);  Surgeon: Sanda Klein, MD;  Location: [facility] ENDOSCOPY;  Service: Cardiovascular;  Laterality: N/A;  right and left heart cath after this procedure   TEE WITHOUT CARDIOVERSION  07/15/2012   Procedure: TRANSESOPHAGEAL ECHOCARDIOGRAM (TEE);  Surgeon: Pixie Casino, MD;  Location: [facility] ENDOSCOPY;  Service: Cardiovascular;  Laterality: N/A;   TEE WITHOUT CARDIOVERSION N/A 03/27/2021   Procedure: TRANSESOPHAGEAL ECHOCARDIOGRAM (TEE);  Surgeon: Sanda Klein, MD;  Location: [facility] ENDOSCOPY;  Service: Cardiovascular;  Laterality: N/A;   TONSILLECTOMY      MEDICATIONS:  albuterol (VENTOLIN HFA) 108 (90 Base) MCG/ACT inhaler   amiodarone (PACERONE) 200 MG tablet   apixaban (ELIQUIS) 2.5  MG TABS tablet   azelastine (ASTELIN) 0.1 % nasal spray   b complex vitamins capsule   Calcium Carbonate-Vit D-Min (CALTRATE MINIS PLUS MINERALS PO)   cholecalciferol (VITAMIN D) 25 MCG (1000 UNIT) tablet   FARXIGA 10 MG TABS tablet   Fe Fum-FA-B Cmp-C-Zn-Mg-Mn-Cu (HEMOCYTE PLUS) 106-1 MG CAPS   ipratropium (ATROVENT) 0.06 % nasal spray   ipratropium-albuterol (DUONEB) 0.5-2.5 (3) MG/3ML SOLN   metoprolol tartrate (LOPRESSOR) 25 MG tablet   multivitamin-lutein (OCUVITE-LUTEIN) CAPS capsule   pantoprazole (PROTONIX) 40 MG tablet   Polyethyl Glycol-Propyl Glycol (SYSTANE) 0.4-0.3 % SOLN   pravastatin (PRAVACHOL) 10 MG tablet   SUMAtriptan (IMITREX) 100 MG tablet   torsemide (DEMADEX) 10 MG tablet   triamcinolone cream (KENALOG) 0.1 %   No current facility-administered medications for this encounter.    [START ON 06/19/2021] ceFAZolin (ANCEF) IVPB 2g/100 mL premix   [START ON 06/19/2021] dexmedetomidine (PRECEDEX) 400 MCG/100ML (4 mcg/mL) infusion   [START ON 06/19/2021] heparin 30,000 units/NS 1000 mL solution for CELLSAVER   [START ON 06/19/2021] magnesium sulfate (IV Push/IM) injection 40 mEq   [START ON 06/19/2021] norepinephrine (LEVOPHED) 4mg  in 263mL premix infusion   [START ON 06/19/2021] potassium chloride injection 80 mEq    Myra Gianotti, PA-C Surgical Short Stay/Anesthesiology [facility] Phone [phone] [facility] Phone [phone] 06/18/2021 11:01 AM

## 2021-06-19 ENCOUNTER — Encounter (HOSPITAL_COMMUNITY)
Admission: RE | Disposition: A | Discharge status: Home / Self Care | Payer: Self-pay | Source: Home / Self Care | Attending: Cardiovascular Disease

## 2021-06-19 ENCOUNTER — Encounter (HOSPITAL_COMMUNITY): Payer: Self-pay | Admitting: Cardiovascular Disease

## 2021-06-19 ENCOUNTER — Inpatient Hospital Stay (HOSPITAL_COMMUNITY): Payer: Medicare Other

## 2021-06-19 ENCOUNTER — Inpatient Hospital Stay (HOSPITAL_COMMUNITY): Payer: Medicare Other | Admitting: Anesthesiology

## 2021-06-19 ENCOUNTER — Other Ambulatory Visit: Payer: Self-pay | Admitting: Physician Assistant

## 2021-06-19 ENCOUNTER — Inpatient Hospital Stay (HOSPITAL_COMMUNITY)
Admission: RE | Admit: 2021-06-19 | Discharge: 2021-06-20 | DRG: 266 | Disposition: A | Discharge status: Home / Self Care | Payer: Medicare Other | Attending: Cardiovascular Disease | Admitting: Cardiovascular Disease

## 2021-06-19 DIAGNOSIS — G43909 Migraine, unspecified, not intractable, without status migrainosus: Secondary | ICD-10-CM | POA: Diagnosis not present

## 2021-06-19 DIAGNOSIS — I350 Nonrheumatic aortic (valve) stenosis: Secondary | ICD-10-CM

## 2021-06-19 DIAGNOSIS — Z79899 Other long term (current) drug therapy: Secondary | ICD-10-CM | POA: Diagnosis not present

## 2021-06-19 DIAGNOSIS — Z952 Presence of prosthetic heart valve: Secondary | ICD-10-CM

## 2021-06-19 DIAGNOSIS — I5033 Acute on chronic diastolic (congestive) heart failure: Secondary | ICD-10-CM

## 2021-06-19 DIAGNOSIS — I4819 Other persistent atrial fibrillation: Secondary | ICD-10-CM | POA: Diagnosis not present

## 2021-06-19 DIAGNOSIS — N1832 Chronic kidney disease, stage 3b: Secondary | ICD-10-CM | POA: Diagnosis present

## 2021-06-19 DIAGNOSIS — R0609 Other forms of dyspnea: Secondary | ICD-10-CM | POA: Diagnosis not present

## 2021-06-19 DIAGNOSIS — K219 Gastro-esophageal reflux disease without esophagitis: Secondary | ICD-10-CM | POA: Diagnosis present

## 2021-06-19 DIAGNOSIS — R0789 Other chest pain: Secondary | ICD-10-CM

## 2021-06-19 DIAGNOSIS — Z006 Encounter for examination for normal comparison and control in clinical research program: Secondary | ICD-10-CM | POA: Diagnosis not present

## 2021-06-19 DIAGNOSIS — I483 Typical atrial flutter: Secondary | ICD-10-CM | POA: Diagnosis not present

## 2021-06-19 DIAGNOSIS — T82857A Stenosis of cardiac prosthetic devices, implants and grafts, initial encounter: Principal | ICD-10-CM | POA: Diagnosis present

## 2021-06-19 DIAGNOSIS — I7121 Aneurysm of the ascending aorta, without rupture: Secondary | ICD-10-CM | POA: Diagnosis not present

## 2021-06-19 DIAGNOSIS — T82897A Other specified complication of cardiac prosthetic devices, implants and grafts, initial encounter: Secondary | ICD-10-CM | POA: Diagnosis present

## 2021-06-19 DIAGNOSIS — Z90710 Acquired absence of both cervix and uterus: Secondary | ICD-10-CM

## 2021-06-19 DIAGNOSIS — I700 Atherosclerosis of aorta: Secondary | ICD-10-CM | POA: Diagnosis present

## 2021-06-19 DIAGNOSIS — I480 Paroxysmal atrial fibrillation: Secondary | ICD-10-CM | POA: Diagnosis not present

## 2021-06-19 DIAGNOSIS — R600 Localized edema: Secondary | ICD-10-CM | POA: Diagnosis not present

## 2021-06-19 DIAGNOSIS — E785 Hyperlipidemia, unspecified: Secondary | ICD-10-CM | POA: Diagnosis not present

## 2021-06-19 DIAGNOSIS — Z7901 Long term (current) use of anticoagulants: Secondary | ICD-10-CM | POA: Diagnosis not present

## 2021-06-19 DIAGNOSIS — I130 Hypertensive heart and chronic kidney disease with heart failure and stage 1 through stage 4 chronic kidney disease, or unspecified chronic kidney disease: Secondary | ICD-10-CM | POA: Diagnosis not present

## 2021-06-19 DIAGNOSIS — I083 Combined rheumatic disorders of mitral, aortic and tricuspid valves: Secondary | ICD-10-CM | POA: Diagnosis present

## 2021-06-19 DIAGNOSIS — Z953 Presence of xenogenic heart valve: Secondary | ICD-10-CM

## 2021-06-19 DIAGNOSIS — Z8679 Personal history of other diseases of the circulatory system: Secondary | ICD-10-CM

## 2021-06-19 DIAGNOSIS — I351 Nonrheumatic aortic (valve) insufficiency: Secondary | ICD-10-CM

## 2021-06-19 DIAGNOSIS — Y831 Surgical operation with implant of artificial internal device as the cause of abnormal reaction of the patient, or of later complication, without mention of misadventure at the time of the procedure: Secondary | ICD-10-CM | POA: Diagnosis present

## 2021-06-19 DIAGNOSIS — T8209XA Other mechanical complication of heart valve prosthesis, initial encounter: Secondary | ICD-10-CM

## 2021-06-19 DIAGNOSIS — Z8249 Family history of ischemic heart disease and other diseases of the circulatory system: Secondary | ICD-10-CM

## 2021-06-19 DIAGNOSIS — I7122 Aneurysm of the aortic arch, without rupture: Secondary | ICD-10-CM | POA: Diagnosis not present

## 2021-06-19 DIAGNOSIS — Z9889 Other specified postprocedural states: Secondary | ICD-10-CM

## 2021-06-19 HISTORY — PX: TRANSCATHETER AORTIC VALVE REPLACEMENT, TRANSFEMORAL: SHX6400

## 2021-06-19 HISTORY — DX: Other mechanical complication of heart valve prosthesis, initial encounter: T82.09XA

## 2021-06-19 HISTORY — PX: INTRAOPERATIVE TRANSTHORACIC ECHOCARDIOGRAM: SHX6523

## 2021-06-19 HISTORY — DX: Paroxysmal atrial fibrillation: I48.0

## 2021-06-19 HISTORY — DX: Presence of prosthetic heart valve: Z95.2

## 2021-06-19 LAB — CBC
HCT: 25.3 % — ABNORMAL LOW (ref 36.0–46.0)
Hemoglobin: 8.2 g/dL — ABNORMAL LOW (ref 12.0–15.0)
MCH: 31.9 pg (ref 26.0–34.0)
MCHC: 32.4 g/dL (ref 30.0–36.0)
MCV: 98.4 fL (ref 80.0–100.0)
Platelets: 150 10*3/uL (ref 150–400)
RBC: 2.57 MIL/uL — ABNORMAL LOW (ref 3.87–5.11)
RDW: 14.9 % (ref 11.5–15.5)
WBC: 6.2 10*3/uL (ref 4.0–10.5)
nRBC: 0 % (ref 0.0–0.2)

## 2021-06-19 LAB — POCT I-STAT, CHEM 8
BUN: 28 mg/dL — ABNORMAL HIGH (ref 8–23)
BUN: 25 mg/dL — ABNORMAL HIGH (ref 8–23)
Calcium, Ion: 1.22 mmol/L (ref 1.15–1.40)
Calcium, Ion: 1.23 mmol/L (ref 1.15–1.40)
Chloride: 104 mmol/L (ref 98–111)
Chloride: 109 mmol/L (ref 98–111)
Creatinine, Ser: 2.2 mg/dL — ABNORMAL HIGH (ref 0.44–1.00)
Creatinine, Ser: 1.9 mg/dL — ABNORMAL HIGH (ref 0.44–1.00)
Glucose, Bld: 131 mg/dL — ABNORMAL HIGH (ref 70–99)
Glucose, Bld: 90 mg/dL (ref 70–99)
HCT: 27 % — ABNORMAL LOW (ref 36.0–46.0)
HCT: 21 % — ABNORMAL LOW (ref 36.0–46.0)
Hemoglobin: 9.2 g/dL — ABNORMAL LOW (ref 12.0–15.0)
Hemoglobin: 7.1 g/dL — ABNORMAL LOW (ref 12.0–15.0)
Potassium: 3.7 mmol/L (ref 3.5–5.1)
Potassium: 3.5 mmol/L (ref 3.5–5.1)
Sodium: 141 mmol/L (ref 135–145)
Sodium: 143 mmol/L (ref 135–145)
TCO2: 27 mmol/L (ref 22–32)
TCO2: 23 mmol/L (ref 22–32)

## 2021-06-19 LAB — ECHOCARDIOGRAM LIMITED
AR max vel: 2.56 cm2
AV Area VTI: 2.61 cm2
AV Area mean vel: 2.39 cm2
AV Mean grad: 13 mmHg
AV Peak grad: 23.9 mmHg
Ao pk vel: 2.44 m/s
P 1/2 time: 259 msec

## 2021-06-19 SURGERY — IMPLANTATION, AORTIC VALVE, TRANSCATHETER, FEMORAL APPROACH
Anesthesia: Monitor Anesthesia Care | Site: Chest

## 2021-06-19 MED ORDER — CHLORHEXIDINE GLUCONATE 4 % EX LIQD: 60.0000 mL | Freq: Once | CUTANEOUS | Status: DC

## 2021-06-19 MED ORDER — CHLORHEXIDINE GLUCONATE 0.12 % MT SOLN
OROMUCOSAL | Status: AC
  Administered 2021-06-19: 15 mL via OROMUCOSAL
  Filled 2021-06-19: qty 15

## 2021-06-19 MED ORDER — ACETAMINOPHEN 325 MG PO TABS
650.0000 mg | ORAL_TABLET | Freq: Four times a day (QID) | ORAL | Status: DC | PRN
  Administered 2021-06-20: 650 mg via ORAL
  Filled 2021-06-19: qty 2

## 2021-06-19 MED ORDER — LIDOCAINE HCL 1 % IJ SOLN
INTRAMUSCULAR | Status: AC
  Filled 2021-06-19: qty 20

## 2021-06-19 MED ORDER — PHENYLEPHRINE 40 MCG/ML (10ML) SYRINGE FOR IV PUSH (FOR BLOOD PRESSURE SUPPORT)
PREFILLED_SYRINGE | INTRAVENOUS | Status: DC | PRN
  Administered 2021-06-19: 40 ug via INTRAVENOUS
  Administered 2021-06-19: 20 ug via INTRAVENOUS
  Administered 2021-06-19: 80 ug via INTRAVENOUS
  Administered 2021-06-19: 40 ug via INTRAVENOUS

## 2021-06-19 MED ORDER — PROPOFOL 500 MG/50ML IV EMUL
INTRAVENOUS | Status: DC | PRN
  Administered 2021-06-19: 10 ug/kg/min via INTRAVENOUS

## 2021-06-19 MED ORDER — PRAVASTATIN SODIUM 10 MG PO TABS: 10.0000 mg | ORAL_TABLET | Freq: Every evening | ORAL | Status: DC

## 2021-06-19 MED ORDER — ONDANSETRON HCL 4 MG/2ML IJ SOLN: 4.0000 mg | Freq: Four times a day (QID) | INTRAMUSCULAR | Status: DC | PRN

## 2021-06-19 MED ORDER — SODIUM CHLORIDE 0.9 % IV SOLN: 250.0000 mL | INTRAVENOUS | Status: DC | PRN

## 2021-06-19 MED ORDER — 0.9 % SODIUM CHLORIDE (POUR BTL) OPTIME
TOPICAL | Status: DC | PRN
  Administered 2021-06-19: 1000 mL

## 2021-06-19 MED ORDER — SODIUM CHLORIDE 0.9 % IV SOLN: INTRAVENOUS | Status: AC

## 2021-06-19 MED ORDER — SODIUM CHLORIDE 0.9% FLUSH
3.0000 mL | Freq: Two times a day (BID) | INTRAVENOUS | Status: DC
  Administered 2021-06-20: 3 mL via INTRAVENOUS

## 2021-06-19 MED ORDER — FENTANYL CITRATE (PF) 250 MCG/5ML IJ SOLN
INTRAMUSCULAR | Status: AC
  Filled 2021-06-19: qty 5

## 2021-06-19 MED ORDER — AMIODARONE HCL 200 MG PO TABS
100.0000 mg | ORAL_TABLET | Freq: Every day | ORAL | Status: DC
  Administered 2021-06-20: 100 mg via ORAL
  Filled 2021-06-19: qty 1

## 2021-06-19 MED ORDER — SODIUM CHLORIDE 0.9 % IV SOLN: INTRAVENOUS | Status: DC

## 2021-06-19 MED ORDER — HEPARIN 6000 UNIT IRRIGATION SOLUTION
Status: DC | PRN
  Administered 2021-06-19 (×3): 1

## 2021-06-19 MED ORDER — FENTANYL CITRATE (PF) 250 MCG/5ML IJ SOLN
INTRAMUSCULAR | Status: DC | PRN
  Administered 2021-06-19: 50 ug via INTRAVENOUS

## 2021-06-19 MED ORDER — HEPARIN 6000 UNIT IRRIGATION SOLUTION
Status: AC
  Filled 2021-06-19: qty 1500

## 2021-06-19 MED ORDER — PHENYLEPHRINE 40 MCG/ML (10ML) SYRINGE FOR IV PUSH (FOR BLOOD PRESSURE SUPPORT)
PREFILLED_SYRINGE | INTRAVENOUS | Status: AC
  Filled 2021-06-19: qty 30

## 2021-06-19 MED ORDER — PANTOPRAZOLE SODIUM 40 MG PO TBEC
40.0000 mg | DELAYED_RELEASE_TABLET | Freq: Every day | ORAL | Status: DC
  Administered 2021-06-20: 40 mg via ORAL
  Filled 2021-06-19: qty 1

## 2021-06-19 MED ORDER — IODIXANOL 320 MG/ML IV SOLN
INTRAVENOUS | Status: DC | PRN
  Administered 2021-06-19: 100 mL

## 2021-06-19 MED ORDER — FUROSEMIDE 10 MG/ML IJ SOLN
INTRAMUSCULAR | Status: AC
  Filled 2021-06-19: qty 8

## 2021-06-19 MED ORDER — LIDOCAINE HCL 1 % IJ SOLN
INTRAMUSCULAR | Status: DC | PRN
  Administered 2021-06-19: 15 mL

## 2021-06-19 MED ORDER — CEFAZOLIN SODIUM-DEXTROSE 2-4 GM/100ML-% IV SOLN
2.0000 g | Freq: Once | INTRAVENOUS | Status: AC
  Administered 2021-06-20: 2 g via INTRAVENOUS
  Filled 2021-06-19: qty 100

## 2021-06-19 MED ORDER — NITROGLYCERIN IN D5W 200-5 MCG/ML-% IV SOLN
0.0000 ug/min | INTRAVENOUS | Status: DC
Start: 2021-06-19 — End: 2021-06-20

## 2021-06-19 MED ORDER — PHENYLEPHRINE 40 MCG/ML (10ML) SYRINGE FOR IV PUSH (FOR BLOOD PRESSURE SUPPORT)
PREFILLED_SYRINGE | INTRAVENOUS | Status: AC
  Filled 2021-06-19: qty 10

## 2021-06-19 MED ORDER — FUROSEMIDE 10 MG/ML IJ SOLN
60.0000 mg | Freq: Once | INTRAMUSCULAR | Status: AC
  Administered 2021-06-19: 60 mg via INTRAVENOUS

## 2021-06-19 MED ORDER — POTASSIUM CHLORIDE CRYS ER 20 MEQ PO TBCR
40.0000 meq | EXTENDED_RELEASE_TABLET | Freq: Once | ORAL | Status: DC
  Filled 2021-06-19: qty 2

## 2021-06-19 MED ORDER — HEPARIN SODIUM (PORCINE) 1000 UNIT/ML IJ SOLN
INTRAMUSCULAR | Status: DC | PRN
  Administered 2021-06-19: 7000 [IU] via INTRAVENOUS

## 2021-06-19 MED ORDER — LIDOCAINE 2% (20 MG/ML) 5 ML SYRINGE
INTRAMUSCULAR | Status: DC | PRN
  Administered 2021-06-19: 40 mg via INTRAVENOUS

## 2021-06-19 MED ORDER — LACTATED RINGERS IV SOLN: INTRAVENOUS | Status: DC

## 2021-06-19 MED ORDER — ACETAMINOPHEN 650 MG RE SUPP: 650.0000 mg | Freq: Four times a day (QID) | RECTAL | Status: DC | PRN

## 2021-06-19 MED ORDER — SODIUM CHLORIDE 0.9% FLUSH: 3.0000 mL | INTRAVENOUS | Status: DC | PRN

## 2021-06-19 MED ORDER — DAPAGLIFLOZIN PROPANEDIOL 10 MG PO TABS
10.0000 mg | ORAL_TABLET | Freq: Every day | ORAL | Status: DC
  Administered 2021-06-20: 10 mg via ORAL
  Filled 2021-06-19: qty 1

## 2021-06-19 MED ORDER — PROTAMINE SULFATE 10 MG/ML IV SOLN
INTRAVENOUS | Status: DC | PRN
  Administered 2021-06-19: 70 mg via INTRAVENOUS

## 2021-06-19 MED ORDER — ALBUMIN HUMAN 5 % IV SOLN: INTRAVENOUS | Status: DC | PRN

## 2021-06-19 MED ORDER — CHLORHEXIDINE GLUCONATE 4 % EX LIQD: 30.0000 mL | CUTANEOUS | Status: DC

## 2021-06-19 MED ORDER — ONDANSETRON HCL 4 MG/2ML IJ SOLN
INTRAMUSCULAR | Status: DC | PRN
  Administered 2021-06-19: 4 mg via INTRAVENOUS

## 2021-06-19 MED ORDER — CHLORHEXIDINE GLUCONATE 0.12 % MT SOLN: 15.0000 mL | Freq: Once | OROMUCOSAL | Status: AC

## 2021-06-19 SURGICAL SUPPLY — 61 items
ADH SKN CLS APL DERMABOND .7 (GAUZE/BANDAGES/DRESSINGS) ×2
APL PRP STRL LF DISP 70% ISPRP (MISCELLANEOUS) ×2
BAG COUNTER SPONGE SURGICOUNT (BAG) ×3 IMPLANT
BAG DECANTER FOR FLEXI CONT (MISCELLANEOUS) IMPLANT
BAG SPNG CNTER NS LX DISP (BAG) ×2
BAG SURGICOUNT SPONGE COUNTING (BAG) ×1
BLADE CLIPPER SURG (BLADE) IMPLANT
BLADE OSCILLATING /SAGITTAL (BLADE) IMPLANT
BLADE STERNUM SYSTEM 6 (BLADE) IMPLANT
CABLE ADAPT CONN TEMP 6FT (ADAPTER) ×4 IMPLANT
CATH DIAG EXPO 6F AL1 (CATHETERS) IMPLANT
CATH DIAG EXPO 6F VENT PIG 145 (CATHETERS) ×8 IMPLANT
CATH INFINITI 6F AL2 (CATHETERS) IMPLANT
CATH S G BIP PACING (CATHETERS) ×4 IMPLANT
CHLORAPREP W/TINT 26 (MISCELLANEOUS) ×4 IMPLANT
CLOSURE MYNX CONTROL 6F/7F (Vascular Products) ×2 IMPLANT
CNTNR URN SCR LID CUP LEK RST (MISCELLANEOUS) ×4 IMPLANT
CONT SPEC 4OZ STRL OR WHT (MISCELLANEOUS) ×8
COVER BACK TABLE 80X110 HD (DRAPES) ×4 IMPLANT
DECANTER SPIKE VIAL GLASS SM (MISCELLANEOUS) ×4 IMPLANT
DERMABOND ADVANCED (GAUZE/BANDAGES/DRESSINGS) ×2
DERMABOND ADVANCED .7 DNX12 (GAUZE/BANDAGES/DRESSINGS) ×2 IMPLANT
DEVICE CLOSURE PERCLS PRGLD 6F (VASCULAR PRODUCTS) ×4 IMPLANT
DRSG TEGADERM 4X4.75 (GAUZE/BANDAGES/DRESSINGS) ×6 IMPLANT
ELECT REM PT RETURN 9FT ADLT (ELECTROSURGICAL) ×4
ELECTRODE REM PT RTRN 9FT ADLT (ELECTROSURGICAL) ×2 IMPLANT
GAUZE SPONGE 4X4 12PLY STRL (GAUZE/BANDAGES/DRESSINGS) ×4 IMPLANT
GLOVE SURG ENC MOIS LTX SZ7.5 (GLOVE) ×4 IMPLANT
GLOVE SURG ENC MOIS LTX SZ8 (GLOVE) IMPLANT
GLOVE SURG ORTHO LTX SZ7.5 (GLOVE) IMPLANT
GOWN STRL REUS W/ TWL LRG LVL3 (GOWN DISPOSABLE) IMPLANT
GOWN STRL REUS W/ TWL XL LVL3 (GOWN DISPOSABLE) ×2 IMPLANT
GOWN STRL REUS W/TWL LRG LVL3 (GOWN DISPOSABLE)
GOWN STRL REUS W/TWL XL LVL3 (GOWN DISPOSABLE) ×4
GUIDEWIRE SAFE TJ AMPLATZ EXST (WIRE) ×4 IMPLANT
KIT BASIN OR (CUSTOM PROCEDURE TRAY) ×4 IMPLANT
KIT HEART LEFT (KITS) ×4 IMPLANT
KIT SAPIAN 3 ULTRA RESILIA 23 (Valve) ×2 IMPLANT
KIT TURNOVER KIT B (KITS) ×4 IMPLANT
NS IRRIG 1000ML POUR BTL (IV SOLUTION) ×4 IMPLANT
PACK ENDO MINOR (CUSTOM PROCEDURE TRAY) ×4 IMPLANT
PAD ARMBOARD 7.5X6 YLW CONV (MISCELLANEOUS) ×8 IMPLANT
PAD ELECT DEFIB RADIOL ZOLL (MISCELLANEOUS) ×4 IMPLANT
PERCLOSE PROGLIDE 6F (VASCULAR PRODUCTS) ×8
POSITIONER HEAD DONUT 9IN (MISCELLANEOUS) ×4 IMPLANT
SET MICROPUNCTURE 5F STIFF (MISCELLANEOUS) ×4 IMPLANT
SHEATH BRITE TIP 7FR 35CM (SHEATH) ×4 IMPLANT
SHEATH PINNACLE 6F 10CM (SHEATH) ×4 IMPLANT
SHEATH PINNACLE 8F 10CM (SHEATH) ×4 IMPLANT
SLEEVE REPOSITIONING LENGTH 30 (MISCELLANEOUS) ×4 IMPLANT
STOPCOCK MORSE 400PSI 3WAY (MISCELLANEOUS) ×8 IMPLANT
SUT PROLENE 6 0 C 1 30 (SUTURE) IMPLANT
SUT SILK  1 MH (SUTURE) ×4
SUT SILK 1 MH (SUTURE) ×2 IMPLANT
SYR 50ML LL SCALE MARK (SYRINGE) ×4 IMPLANT
SYR BULB IRRIG 60ML STRL (SYRINGE) IMPLANT
TOWEL GREEN STERILE (TOWEL DISPOSABLE) ×8 IMPLANT
TRANSDUCER W/STOPCOCK (MISCELLANEOUS) ×8 IMPLANT
WIRE AMPLATZ SS-J .035X180CM (WIRE) ×2 IMPLANT
WIRE EMERALD 3MM-J .035X150CM (WIRE) ×4 IMPLANT
WIRE EMERALD 3MM-J .035X260CM (WIRE) ×4 IMPLANT

## 2021-06-19 NOTE — Op Note (Signed)
[facility]ART AND VASCULAR CENTER   MULTIDISCIPLINARY HEART VALVE TEAM   TAVR OPERATIVE NOTE   Date of Procedure:  06/19/2021  Preoperative Diagnosis: Severe Aortic Stenosis   Postoperative Diagnosis: Same   Procedure:   Transcatheter Aortic Valve in Valve Replacement - Percutaneous Left Transfemoral Approach  Edwards Sapien 3 Ultra ResiliaTHV (size 23 mm, model # 9755RSL, serial # [id number])   Co-Surgeons:  Gaye Pollack, MD and /  Lauree Chandler, MD   Anesthesiologist:  Raechel Ache, MD  Echocardiographer:  Osborne Oman, MD  Pre-operative Echo Findings: Severe aortic stenosis Normal left ventricular systolic function  Post-operative Echo Findings: Trivial paravalvular leak Normal left ventricular systolic function   BRIEF CLINICAL NOTE AND INDICATIONS FOR SURGERY  This 80 year old woman has stage D, severe, symptomatic prosthetic aortic valve insufficiency with mild to moderate stenosis and New York Heart Association class III symptoms of exertional fatigue and shortness of breath consistent with chronic diastolic congestive heart failure. I have personally reviewed her 2D echo, TEE, cardiac catheterization, and CTA studies. TEE shows a calcified aortic valve prosthesis with restricted leaflet mobility and severe regurgitation with a pressure half-time of 336 ms. The mean gradient is 13.4 mmHg. Left ventricular ejection fraction is normal with grade 2 diastolic dysfunction. There is mild to moderate mitral regurgitation with a centrally directed jet. Cardiac catheterization shows no coronary disease with moderate pulmonary hypertension with a PA pressure of 70/23 and a mean pulmonary wedge pressure of 26 with a V wave of 45. I agree that aortic valve replacement is indicated in this patient for relief of her progressive symptoms and to prevent left ventricular deterioration. Given her advanced age and prior cardiac surgery I think the best option for treating her is valve in  valve TAVR. Her 21 mm prosthetic valve is suitable for a 23 mm Edwards SAPIEN 3 valve. Her abdominal and pelvic CTA shows adequate pelvic vascular access to allow transfemoral insertion.   The patient and her son were counseled at length regarding treatment alternatives for management of severe symptomatic aortic stenosis. The risks and benefits of surgical intervention has been discussed in detail. Long-term prognosis with medical therapy was discussed. Alternative approaches such as conventional surgical aortic valve replacement, transcatheter aortic valve replacement, and palliative medical therapy were compared and contrasted at length. This discussion was placed in the context of the patient's own specific clinical presentation and past medical history. All of their questions have been addressed.   Following the decision to proceed with transcatheter aortic valve replacement, a discussion was held regarding what types of management strategies would be attempted intraoperatively in the event of life-threatening complications, including whether or not the patient would be considered a candidate for the use of cardiopulmonary bypass and/or conversion to open sternotomy for attempted surgical intervention. Given her age, stage 3-4 CKD and prior sternotomy for Bentall procedure I do not think she would be a candidate for emergent sternotomy to manage any intraoperative complications. The patient is aware of the fact that transient use of cardiopulmonary bypass may be necessary. The patient has been advised of a variety of complications that might develop including but not limited to risks of death, stroke, paravalvular leak, aortic dissection or other major vascular complications, aortic annulus rupture, device embolization, cardiac rupture or perforation, mitral regurgitation, acute myocardial infarction, arrhythmia, heart block or bradycardia requiring permanent pacemaker placement, congestive heart failure,  respiratory failure, renal failure, pneumonia, infection, other late complications related to structural valve deterioration or migration, or other complications that  might ultimately cause a temporary or permanent loss of functional independence or other long term morbidity. The patient provides full informed consent for the procedure as described and all questions were answered.     DETAILS OF THE OPERATIVE PROCEDURE  PREPARATION:    The patient was brought to the operating room on the above mentioned date and appropriate monitoring was established by the anesthesia team. The patient was placed in the supine position on the operating table.  Intravenous antibiotics were administered. The patient was monitored closely throughout the procedure under conscious sedation.    Baseline transthoracic echocardiogram was performed. The patient's abdomen and both groins were prepped and draped in a sterile manner. A time out procedure was performed.   PERIPHERAL ACCESS:    Using the modified Seldinger technique, femoral arterial and venous access was obtained with placement of 6 Fr sheaths on the right side.  A pigtail diagnostic catheter was passed through the right arterial sheath under fluoroscopic guidance into the aortic root.  A temporary transvenous pacemaker catheter was passed through the right femoral venous sheath under fluoroscopic guidance into the right ventricle.  The pacemaker was tested to ensure stable lead placement and pacemaker capture.   TRANSFEMORAL ACCESS:   Percutaneous transfemoral access and sheath placement was performed using ultrasound guidance.  The left common femoral artery was cannulated using a micropuncture needle and appropriate location was verified using hand injection angiogram.  A pair of Abbott Perclose percutaneous closure devices were placed and a 6 French sheath replaced into the femoral artery.  The patient was heparinized systemically and ACT verified > 250  seconds.    A 14 Fr transfemoral E-sheath was introduced into the left common femoral artery after progressively dilating over an Amplatz superstiff wire. An AL-1 catheter was used to direct a straight-tip exchange length wire across the native aortic valve into the left ventricle. This was exchanged out for a pigtail catheter and position was confirmed in the LV apex. Simultaneous LV and Ao pressures were recorded.  The pigtail catheter was exchanged for an Amplatz Extra-stiff wire in the LV apex.   BALLOON AORTIC VALVULOPLASTY:   Not performed   TRANSCATHETER HEART VALVE DEPLOYMENT:   An Edwards Sapien 3 Ultra transcatheter heart valve (size 23 mm) was prepared and crimped per manufacturer's guidelines, and the proper orientation of the valve is confirmed on the [employer] delivery system. The valve was advanced through the introducer sheath using normal technique until in an appropriate position in the abdominal aorta beyond the sheath tip. The balloon was then retracted and using the fine-tuning wheel was centered on the valve. The valve was then advanced across the aortic arch using appropriate flexion of the catheter. The valve was carefully positioned across the aortic valve annulus. The Commander catheter was retracted using normal technique. Once final position of the valve has been confirmed by angiographic assessment, the valve is deployed while temporarily holding ventilation and during rapid ventricular pacing to maintain systolic blood pressure < 50 mmHg and pulse pressure < 10 mmHg. The balloon inflation is held for >3 seconds after reaching full deployment volume. Once the balloon has fully deflated the balloon is retracted into the ascending aorta and valve function is assessed using echocardiography. There is felt to be trivial paravalvular leak and no central aortic insufficiency.  The patient's hemodynamic recovery following valve deployment is good.  The deployment balloon and  guidewire are both removed.    PROCEDURE COMPLETION:   The sheath was removed  and femoral artery closure performed.  Protamine was administered once femoral arterial repair was complete. The temporary pacemaker, pigtail catheters and femoral sheaths were removed with manual pressure used for hemostasis.  A Mynx femoral closure device was utilized following removal of the diagnostic sheath in the right femoral artery.  The patient tolerated the procedure well and is transported to the cath lab recovery area in stable condition. There were no immediate intraoperative complications. All sponge instrument and needle counts are verified correct at completion of the operation.   No blood products were administered during the operation.  The patient received intravenous contrast during the procedure.   Gaye Pollack, MD 06/19/2021

## 2021-06-19 NOTE — Progress Notes (Signed)
Pt arrived to unit from Cath lab, A/O x 4,  CCMD called ,CHG given, pt oriented to unit, Bilateral groin level 0, Pt SB Base line Will continue to monitor.   Albin Felling Miroslav Gin, RN    06/19/21 1759  Vitals  Temp 97.6 F (36.4 C)  Temp Source Axillary  BP 128/82  MAP (mmHg) 96  BP Location Left Arm  BP Method Automatic  Patient Position (if appropriate) Lying  Pulse Rate (!) 57  Pulse Rate Source Monitor  ECG Heart Rate (!) 58  Resp 20  Level of Consciousness  Level of Consciousness Alert  Oxygen Therapy  SpO2 92 %  O2 Device Nasal Cannula  O2 Flow Rate (L/min) 2 L/min  Pain Assessment  Pain Scale 0-10  Pain Score 0  MEWS Score  MEWS Temp 0  MEWS Systolic 0  MEWS Pulse 0  MEWS RR 0  MEWS LOC 0  MEWS Score 0  MEWS Score Color Nyoka Cowden

## 2021-06-19 NOTE — CV Procedure (Signed)
[facility]  TAVR OPERATIVE NOTE   Date of Procedure:  06/19/2021  Preoperative Diagnosis: Severe Aortic Stenosis   Postoperative Diagnosis: Same   Procedure:   Transcatheter Aortic Valve Replacement - Transfemoral Approach  Edwards Sapien 3 THV (size 23 mm, model # T562222, serial # [id number])   Co-Surgeons:  Lauree Chandler, MD and Gaye Pollack, MD  Anesthesiologist:  Fransisco Beau  Echocardiographer:  Gasper Sells  Pre-operative Echo Findings: Severe aortic stenosis normal left ventricular systolic function  Post-operative Echo Findings: Trivial paravalvular leak Normal left ventricular systolic function   BRIEF CLINICAL NOTE AND INDICATIONS FOR SURGERY  80 yo female with history of paroxysmal atrial fibrillation, thoracic aortic aneurysm, aortic valve disease s/p bioprosthetic AVR in 2004, GERD, hyperlipidemia with bioprosthetic aortic valve dysfunction. She is here today for TAVR. She underwent AVR in 2004 with a bioprosthetic AVR and aortic root replacement at that time. (21 mm Edwards Perimount T2671 bioprosthetic valve-Dr. Prescott Gum). She has recently had progressive dyspnea. Echo 03/06/21 with LVEF=55-60%, moderate LVH. Mild mitral regurgitation. The bioprosthetic aortic valve has at least moderate aortic insufficiency with moderate aortic stenosis, mean gradient 28 mmHg. TEE 03/27/21 showed moderate to severe AI with PHT 336 msec. Mean gradient 13.4 mmHg. Minimal carotid artery disease by dopplers August 2022. She has paroxysmal atrial fibrillation on Eliquis and amiodarone.    During the course of the patient's preoperative work up they have been evaluated comprehensively by a multidisciplinary team of specialists coordinated through the [facility] in the [facility].  They have been demonstrated to suffer from symptomatic severe aortic stenosis as noted above. The patient has been counseled extensively as  to the relative risks and benefits of all options for the treatment of severe aortic stenosis including long term medical therapy, conventional surgery for aortic valve replacement, and transcatheter aortic valve replacement.  The patient has been independently evaluated by Dr. Cyndia Bent with CT surgery and they are felt to be at high risk for conventional surgical aortic valve replacement. The surgeon indicated the patient would be a poor candidate for conventional surgery. Based upon review of all of the patient's preoperative diagnostic tests they are felt to be candidate for transcatheter aortic valve replacement using the transfemoral approach as an alternative to high risk conventional surgery.    Following the decision to proceed with transcatheter aortic valve replacement, a discussion has been held regarding what types of management strategies would be attempted intraoperatively in the event of life-threatening complications, including whether or not the patient would be considered a candidate for the use of cardiopulmonary bypass and/or conversion to open sternotomy for attempted surgical intervention.  The patient has been advised of a variety of complications that might develop peculiar to this approach including but not limited to risks of death, stroke, paravalvular leak, aortic dissection or other major vascular complications, aortic annulus rupture, device embolization, cardiac rupture or perforation, acute myocardial infarction, arrhythmia, heart block or bradycardia requiring permanent pacemaker placement, congestive heart failure, respiratory failure, renal failure, pneumonia, infection, other late complications related to structural valve deterioration or migration, or other complications that might ultimately cause a temporary or permanent loss of functional independence or other long term morbidity.  The patient provides full informed consent for the procedure as described and all questions were  answered preoperatively.    DETAILS OF THE OPERATIVE PROCEDURE  PREPARATION:   The patient is brought to the operating room on the above mentioned date and central monitoring  was established by the anesthesia team including placement of a radial arterial line. The patient is placed in the supine position on the operating table.  Intravenous antibiotics are administered. Conscious sedation is used.   Baseline transthoracic echocardiogram was performed. The patient's chest, abdomen, both groins, and both lower extremities are prepared and draped in a sterile manner. A time out procedure is performed.   PERIPHERAL ACCESS:   Using the modified Seldinger technique, femoral arterial and venous access were obtained with placement of a 6 Fr sheath in the artery and a 7 Fr sheath in the vein on the right side using u/s guidance.  A pigtail diagnostic catheter was passed through the femoral arterial sheath under fluoroscopic guidance into the aortic root.  A temporary transvenous pacemaker catheter was passed through the femoral venous sheath under fluoroscopic guidance into the right ventricle.  The pacemaker was tested to ensure stable lead placement and pacemaker capture.   TRANSFEMORAL ACCESS:  A micropuncture kit was used to gain access to the left femoral artery using u/s guidance. Position confirmed with angiography. Pre-closure with double ProGlide closure devices. The patient was heparinized systemically and ACT verified > 250 seconds.    A 14 Fr transfemoral E-sheath was introduced into the left femoral artery after progressively dilating over an Amplatz superstiff wire. An AL-1 catheter was used to direct a straight-tip exchange length wire across the native aortic valve into the left ventricle. This was exchanged out for a pigtail catheter and position was confirmed in the LV apex. Simultaneous LV and Ao pressures were recorded.  The pigtail catheter was then exchanged for an Amplatz Extra-stiff  wire in the LV apex.   TRANSCATHETER HEART VALVE DEPLOYMENT:  An Edwards Sapien 3 THV (size 23 mm) was prepared and crimped per manufacturer's guidelines, and the proper orientation of the valve is confirmed on the [employer] delivery system. The valve was advanced through the introducer sheath using normal technique until in an appropriate position in the abdominal aorta beyond the sheath tip. The balloon was then retracted and using the fine-tuning wheel was centered on the valve. The valve was then advanced across the aortic arch using appropriate flexion of the catheter. The valve was carefully positioned across the aortic valve annulus with depth determined by fluoroscopy of the old valve struts. The Commander catheter was retracted using normal technique. Once final position of the valve has been confirmed by angiographic assessment, the valve is deployed during rapid ventricular pacing to maintain systolic blood pressure < 50 mmHg and pulse pressure < 10 mmHg. The balloon inflation is held for >3 seconds after reaching full deployment volume. Once the balloon has fully deflated the balloon is retracted into the ascending aorta and valve function is assessed using TTE. There is felt to be trivial paravalvular leak and no central aortic insufficiency.  The patient's hemodynamic recovery following valve deployment is good.  The deployment balloon and guidewire are both removed. Echo demostrated acceptable post-procedural gradients, stable mitral valve function, and trivial AI.   PROCEDURE COMPLETION:  The sheath was then removed and closure devices were completed. Protamine was administered once femoral arterial repair was complete. The temporary pacemaker, pigtail catheters and femoral sheaths were removed with a Mynx closure device placed in the artery and manual pressure used for venous hemostasis.    The patient tolerated the procedure well and is transported to the surgical intensive care in  stable condition. There were no immediate intraoperative complications. All sponge instrument and needle counts  are verified correct at completion of the operation.   No blood products were administered during the operation.  The patient no intravenous contrast during the procedure.  Lauree Chandler MD 06/19/2021 4:11 PM

## 2021-06-19 NOTE — Anesthesia Postprocedure Evaluation (Signed)
Anesthesia Post Note  Patient: [name] [name]  Procedure(s) Performed: TRANSCATHETER AORTIC VALVE REPLACEMENT, TRANSFEMORAL USING A 23MM EDWARDS VALVE (Chest) INTRAOPERATIVE TRANSTHORACIC ECHOCARDIOGRAM     Patient location during evaluation: PACU Anesthesia Type: MAC Level of consciousness: awake and alert Pain management: pain level controlled Vital Signs Assessment: post-procedure vital signs reviewed and stable Respiratory status: spontaneous breathing, nonlabored ventilation, respiratory function stable and patient connected to nasal cannula oxygen Cardiovascular status: stable and blood pressure returned to baseline Anesthetic complications: no   No notable events documented.  Last Vitals:  Vitals:   06/19/21 1735 06/19/21 1759  BP: (!) 110/40 128/82  Pulse: (!) 51 (!) 57  Resp: (!) 25 20  Temp:  36.4 C  SpO2: 92% 92%    Last Pain:  Vitals:   06/19/21 1759  TempSrc: Axillary  PainSc: 0-No pain                 Audry Pili

## 2021-06-19 NOTE — Progress Notes (Signed)
[name] [name] aware of post op H and H. Blood drawn from arterial line for CBC, chem 8 and type and screen, samples given to lab

## 2021-06-19 NOTE — Progress Notes (Signed)
  [facility] VALVE TEAM  Patient doing well s/p TAVR. She is hemodynamically stable however post-operative ISTAT Hb down to 7.1 from 9.2. Groin sites stable with no signs of hematoma or bleeding. Telemetry with no high grade block although she is bradycardic which appears to be at her baseline. EKG in process now. Arterial line remains in place therefore will obtain CBC and H&H. Will also ensure type and screen are available if transfusion is needed. Once stable, will plan to DC arterial line and transfer to 4E. Plan for early ambulation after bedrest completed and hopeful discharge over the next 24-48 hours.   Kathyrn Drown NP-C Structural Heart Team  Pager: [phone]

## 2021-06-19 NOTE — Progress Notes (Signed)
  Echocardiogram 2D Echocardiogram has been performed.  [name] [name] M 06/19/2021, 3:49 PM

## 2021-06-19 NOTE — Anesthesia Procedure Notes (Signed)
Arterial Line Insertion Start/End11/15/2022 1:00 PM, 06/19/2021 1:27 PM Performed by: Leonor Liv, CRNA, CRNA  Patient location: Pre-op. Emergency situation Lidocaine 1% used for infiltration Right, radial was placed Catheter size: 20 G Hand hygiene performed , maximum sterile barriers used  and Seldinger technique used Allen's test indicative of satisfactory collateral circulation Attempts: 1 Procedure performed without using ultrasound guided technique. Following insertion, Biopatch and dressing applied. Post procedure complications: local hematoma. Patient tolerated the procedure well with no immediate complications. Additional procedure comments: Bruising noted to insertion site, pressure applied along with ice pack.

## 2021-06-19 NOTE — Transfer of Care (Signed)
Immediate Anesthesia Transfer of Care Note  Patient: [name] [name]  Procedure(s) Performed: TRANSCATHETER AORTIC VALVE REPLACEMENT, TRANSFEMORAL USING A 23MM EDWARDS VALVE (Chest) INTRAOPERATIVE TRANSTHORACIC ECHOCARDIOGRAM  Patient Location: Cath Lab  Anesthesia Type:MAC  Level of Consciousness: awake, alert  and oriented  Airway & Oxygen Therapy: Patient Spontanous Breathing and Patient connected to nasal cannula oxygen  Post-op Assessment: Report given to RN and Post -op Vital signs reviewed and stable  Post vital signs: Reviewed and stable  Last Vitals:  Vitals Value Taken Time  BP 106/37 06/19/21 1619  Temp 36.3 C 06/19/21 1619  Pulse 47 06/19/21 1624  Resp 11 06/19/21 1624  SpO2 94 % 06/19/21 1624  Vitals shown include unvalidated device data.  Last Pain:  Vitals:   06/19/21 1619  TempSrc: Temporal  PainSc: Asleep      Patients Stated Pain Goal: 0 (67/20/94 [id number])  Complications: No notable events documented.

## 2021-06-19 NOTE — Anesthesia Procedure Notes (Signed)
Procedure Name: MAC Date/Time: 06/19/2021 2:27 PM Performed by: Leonor Liv, CRNA Pre-anesthesia Checklist: Patient identified, Emergency Drugs available, Suction available, Patient being monitored and Timeout performed Patient Re-evaluated:Patient Re-evaluated prior to induction Oxygen Delivery Method: Simple face mask Placement Confirmation: positive ETCO2 Dental Injury: Teeth and Oropharynx as per pre-operative assessment

## 2021-06-20 ENCOUNTER — Encounter (HOSPITAL_COMMUNITY): Payer: Self-pay | Admitting: Cardiovascular Disease

## 2021-06-20 ENCOUNTER — Ambulatory Visit: Payer: Medicare Other | Admitting: Cardiovascular Disease

## 2021-06-20 ENCOUNTER — Inpatient Hospital Stay (HOSPITAL_COMMUNITY): Payer: Medicare Other

## 2021-06-20 ENCOUNTER — Other Ambulatory Visit: Payer: Self-pay

## 2021-06-20 DIAGNOSIS — T82857A Stenosis of cardiac prosthetic devices, implants and grafts, initial encounter: Secondary | ICD-10-CM | POA: Diagnosis not present

## 2021-06-20 DIAGNOSIS — Z952 Presence of prosthetic heart valve: Secondary | ICD-10-CM

## 2021-06-20 DIAGNOSIS — I351 Nonrheumatic aortic (valve) insufficiency: Secondary | ICD-10-CM | POA: Diagnosis not present

## 2021-06-20 DIAGNOSIS — I5033 Acute on chronic diastolic (congestive) heart failure: Secondary | ICD-10-CM | POA: Diagnosis not present

## 2021-06-20 DIAGNOSIS — I7122 Aneurysm of the aortic arch, without rupture: Secondary | ICD-10-CM | POA: Diagnosis not present

## 2021-06-20 DIAGNOSIS — Z006 Encounter for examination for normal comparison and control in clinical research program: Secondary | ICD-10-CM | POA: Diagnosis not present

## 2021-06-20 LAB — CBC
HCT: 26.8 % — ABNORMAL LOW (ref 36.0–46.0)
Hemoglobin: 8.7 g/dL — ABNORMAL LOW (ref 12.0–15.0)
MCH: 31.9 pg (ref 26.0–34.0)
MCHC: 32.5 g/dL (ref 30.0–36.0)
MCV: 98.2 fL (ref 80.0–100.0)
Platelets: 153 10*3/uL (ref 150–400)
RBC: 2.73 MIL/uL — ABNORMAL LOW (ref 3.87–5.11)
RDW: 15 % (ref 11.5–15.5)
WBC: 4.6 10*3/uL (ref 4.0–10.5)
nRBC: 0 % (ref 0.0–0.2)

## 2021-06-20 LAB — ECHOCARDIOGRAM COMPLETE
AR max vel: 1.22 cm2
AV Area VTI: 1.29 cm2
AV Area mean vel: 1.25 cm2
AV Mean grad: 23 mmHg
AV Peak grad: 38.9 mmHg
Ao pk vel: 3.12 m/s
Area-P 1/2: 4.33 cm2
Calc EF: 62.3 %
Height: 61 in
S' Lateral: 3.3 cm
Single Plane A2C EF: 58.2 %
Single Plane A4C EF: 63.8 %
Weight: 1785.3 oz

## 2021-06-20 LAB — MAGNESIUM: Magnesium: 2.2 mg/dL (ref 1.7–2.4)

## 2021-06-20 LAB — BASIC METABOLIC PANEL
Anion gap: 9 (ref 5–15)
BUN: 24 mg/dL — ABNORMAL HIGH (ref 8–23)
CO2: 26 mmol/L (ref 22–32)
Calcium: 8.6 mg/dL — ABNORMAL LOW (ref 8.9–10.3)
Chloride: 100 mmol/L (ref 98–111)
Creatinine, Ser: 1.89 mg/dL — ABNORMAL HIGH (ref 0.44–1.00)
GFR, Estimated: 26 mL/min — ABNORMAL LOW (ref >60)
Glucose, Bld: 100 mg/dL — ABNORMAL HIGH (ref 70–99)
Potassium: 3.5 mmol/L (ref 3.5–5.1)
Sodium: 135 mmol/L (ref 135–145)

## 2021-06-20 MED FILL — Potassium Chloride Inj 2 mEq/ML: INTRAVENOUS | Qty: 40 | Status: AC

## 2021-06-20 MED FILL — Heparin Sodium (Porcine) Inj 1000 Unit/ML: Qty: 1000 | Status: AC

## 2021-06-20 MED FILL — Magnesium Sulfate Inj 50%: INTRAMUSCULAR | Qty: 10 | Status: AC

## 2021-06-20 NOTE — Discharge Summary (Addendum)
[facility] VALVE TEAM  Discharge Summary    Patient ID: [name] [name] MRN: [id number]; DOB: Mar 22, 1941  Admit date: 06/19/2021 Discharge date: 06/20/2021  Primary Care Provider: [name], MD  Primary Cardiologist: Quay Burow, MD / Dr. Angelena Form & Dr. 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   Paroxysmal atrial fibrillation (HCC)   Hyperlipidemia   History of aortic valve replacement with bioprosthetic valve   Severe aortic valve regurgitation   Prosthetic valve dysfunction   Acute on chronic diastolic heart failure (HCC)   Allergies Allergies  Allergen Reactions   Other Nausea And Vomiting    Headache also  Also from artificial sweeetners   Codeine Nausea And Vomiting    Diagnostic Studies/Procedures      TAVR OPERATIVE NOTE     Date of Procedure:                06/19/2021   Preoperative Diagnosis:      Severe Aortic Stenosis    Postoperative Diagnosis:    Same    Procedure:        Transcatheter Aortic Valve in Valve Replacement - Percutaneous Left Transfemoral Approach             Edwards Sapien 3 Ultra ResiliaTHV (size 23 mm, model # [id number], serial # [id number])              Co-Surgeons:                        Gaye Pollack, MD and /  Lauree Chandler, MD     Anesthesiologist:                  Raechel Ache, MD   Echocardiographer:              Osborne Oman, MD   Pre-operative Echo Findings: Severe aortic stenosis Normal left ventricular systolic function   Post-operative Echo Findings: Trivial paravalvular leak Normal left ventricular systolic function   _____________    Echo 06/20/21: completed but pending formal read at the time of discharge   History of Present Illness     [name] [name] is a 80 y.o. female  with a history of PAF on amio & Eliquis, thoracic aortic aneurysm, CKD stage IIIb, aortic valve disease s/p bioprosthetic AVR (21 mm Edwards Perimount L7200 bioprosthetic valve-Dr. 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   She is status post biological Bentall procedure in 2004 by Dr. Prescott Gum. She has a 21 mm Edwards Perimount 7200 bioprosthetic valve.  She had an echocardiogram on 03/06/2021 that showed the bioprosthetic aortic valve to be heavily calcified.  The mean gradient was 28 mmHg with at least moderate eccentric aortic insufficiency.  Aortic root was noted to be 40 mm.  She underwent TEE on 03/27/2021 for further evaluation of the valve which showed moderate to severe insufficiency with no paravalvular leak.  The mean gradient was 13.4 mmHg with an AI pressure half-time of 336 ms.  Left ventricular ejection fraction was 60 to 65%.  There is grade 2 diastolic dysfunction.  There was mild to moderate mitral regurgitation. [facility] 04/19/21 showed no  CAD. Hemodynamic findings consistent with moderate pulmonary hypertension and elevated right heart pressures. Her torsemide was increased to 20 mg daily at that time.   The patient has been evaluated by the multidisciplinary valve team and felt to have severe, symptomatic aortic stenosis and to be a suitable candidate for TAVR, which was set up for 06/19/21.   Hospital Course     Consultants: none   Severe AS: s/p successful valve valve TAVR with a 23 mm Edwards Sapien 3 Ultra Resilia THV via the TF approach on 06/19/21. Post operative echo pending. Groin sites are stable. ECG with sinus and no high grade heart block. Resumed on home Eliquis. Plan for discharge home today with close follow up in the office next week.   Acute on chronic diastolic CHF: as evidenced by an extremely elevated BNP >2800 on pre admission lab work and cardiomegaly  on pre op CXR. LVEDP was 26 during TAVR. This has been treated with TAVR as well as one dose of IV lasix 60 mg daily. Resume home torsemide 20mg  daily and Farixga.   Hypoxia: noted to be hypoxic overnight with sleep. I took 02 off her this AM for >10 min and 02 stayed over 92% at rest. May be related to volume overload. CXR did show possible COPD. Will continue to monitor. Consider ordering a sleep study in the outpatient setting.  PAF: maintaining sinus. Continue amio and resumed on Eliquis 2.5 mg BID.   CKD stage IIIb: creat improved with IV lasix: 2.20--> 1.89.  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 Recommend semi-annual imaging followup by CTA or MRA. This will be followed over time.  _____________  Discharge Vitals Blood pressure (!) 97/46, pulse (!) 58, temperature 97.8 F (36.6 C), temperature source Oral, resp. rate 16, height 5\' 1"  (1.549 m), weight 50.6 kg, SpO2 95 %.  Filed Weights   06/19/21 1151 06/20/21 0404  Weight: 49.9 kg 50.6 kg     GEN: Well nourished, well developed, in no acute distress HEENT: normal Neck: no JVD or masses Cardiac: RRR; 3/6 SEM. No rubs, or gallops,no edema  Respiratory:  clear to auscultation bilaterally, normal work of breathing GI: soft, nontender, nondistended, + BS MS: no deformity or atrophy Skin: warm and dry, no rash.  Groin sites clear without hematoma or ecchymosis Neuro:  Alert and Oriented x 3, Strength and sensation are intact Psych: euthymic mood, full affect   Labs & Radiologic Studies    CBC Recent Labs    06/19/21 1712 06/20/21 0137  WBC 6.2 4.6  HGB 8.2* 8.7*  HCT 25.3* 26.8*  MCV 98.4 98.2  PLT 150 353   Basic Metabolic Panel Recent Labs    06/19/21 1638 06/20/21 0137  NA 143 135  K 3.5 3.5  CL 109 100  CO2  --  26  GLUCOSE 90 100*  BUN 25* 24*  CREATININE 1.90* 1.89*  CALCIUM  --  8.6*  MG  --  2.2   Liver Function  Tests No results for input(s): AST, ALT, ALKPHOS, BILITOT, PROT, ALBUMIN in the last 72 hours. No results for input(s): LIPASE, AMYLASE in the last 72 hours. Cardiac Enzymes No results for input(s): CKTOTAL, CKMB, CKMBINDEX, TROPONINI in the last 72 hours. BNP Invalid input(s): POCBNP D-Dimer No results for input(s): DDIMER in the last 72 hours. Hemoglobin A1C No results for input(s): HGBA1C in the last 72 hours. Fasting Lipid Panel No results for  input(s): CHOL, HDL, LDLCALC, TRIG, CHOLHDL, LDLDIRECT in the last 72 hours. Thyroid Function Tests No results for input(s): TSH, T4TOTAL, T3FREE, THYROIDAB in the last 72 hours.  Invalid input(s): FREET3 _____________  DG Chest 2 View  Result Date: 06/17/2021 CLINICAL DATA:  Preoperative assessment. EXAM: CHEST - 2 VIEW COMPARISON:  03/24/2021. FINDINGS: The heart is enlarged atherosclerotic calcification of the aorta is noted. The pulmonary vasculature is stable. There is hyperinflation of the lungs with increased AP diameter of the chest suggesting chronic obstructive pulmonary disease. No consolidation, effusion, or pneumothorax. Sternotomy wires are present over the midline and a cardiac valve prosthesis is noted. No acute osseous abnormality. IMPRESSION: 1. No acute infiltrate. 2. Cardiomegaly. 3. Findings suggestive of chronic obstructive pulmonary disease. Electronically Signed   By: Brett Fairy M.D.   On: 06/17/2021 23:30   CT CORONARY MORPH W/CTA COR W/SCORE W/CA W/CM &/OR WO/CM  Addendum Date: 05/28/2021   ADDENDUM REPORT: 05/28/2021 09:59 ADDENDUM: Extracardiac findings will be described separately under dictation for contemporaneously obtained CTA chest, abdomen and pelvis dated 05/25/2021. Please see that report for full description of relevant extracardiac findings. Electronically Signed   By: Vinnie Langton M.D.   On: 05/28/2021 09:59   Result Date: 05/28/2021 CLINICAL DATA:  Aortic stenosis and regurgitation, with prior  cardiothoracic surgeries in 2004 and 2013. EXAM: Cardiac TAVR CT TECHNIQUE: The patient was scanned on a Siemens Force 841 slice scanner. A 120 kV retrospective scan was triggered in the descending thoracic aorta at 111 HU's. Gantry rotation speed was 270 msecs and collimation was .9 mm. No beta blockade or nitro were given. The 3D data set was reconstructed in 5% intervals of the R-R cycle. Systolic and diastolic phases were analyzed on a dedicated work station using MPR, MIP and VRT modes. The patient received 100 cc of contrast. FINDINGS: Prosthetic aortic Valve: There is a 21 mm Edwards Perimount 2700 Valve in the Aortic position with appropriate leaflet excursion and mild thickening on the right and left leaflets LVOT calcification: None Annular calcification: None Aortic Annulus Measurements (prosthetic) Major annulus diameter: 20 mm Minor annulus diameter:19 mm Annular perimeter: 60 mm Geometric orifice area: 2.7 cm2 Aortic Root Measurements Sinus of Valsalva perimeter (native): 114 mm Prosthetic SoV perimeter 69 mm Sinotubular Junction: 25 mm Ascending Thoracic Aorta: 29 mm Aortic Arch: 42 mm Descending Thoracic Aorta: 25 mm There are invaginations into the ascending aorta without coarctation likely reflective of prior ascending aortic surgical intervention. Sinus of Valsalva Measurements: Sinus to Commissure Right coronary cusp width: 21 mm Left coronary cusp width: 21 mm Non coronary cusp width: 21 mm Coronary Artery Height above Annulus: Left Main: 18 mm VTC: 12 mm Right Coronary: 17 mm VTC: 5 mm Coronary Arteries: Coronary Artery Calcium score: 0 Left main: 0 Left anterior descending artery: 0 Left circumflex artery: 0 Right coronary artery: 0 Total: 0 Percentile: 1st for age, sex, and race matched control. Optimum Fluoroscopic Angle for Delivery: LAO 16 CAU 12 Valves for structural team consideration: 23 mm Edwards Sapien, 23 mm Evolut PRO IMPRESSION: 1. Prosthetic aortic valve evaluation with findings  pertinent to Valve in Valve TAVR procedure are detailed above. 2. 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 RECOMMENDATIONS: Coronary artery calcium (CAC) score is a strong predictor of incident coronary heart disease (CHD) and provides predictive information beyond traditional risk factors. CAC scoring is reasonable to use in the decision to withhold, postpone, or initiate statin therapy in  intermediate-risk or selected borderline-risk asymptomatic adults (age 99-75 years and LDL-C >=70 to <190 mg/dL) who do not have diabetes or established atherosclerotic cardiovascular disease (ASCVD).* In intermediate-risk (10-year ASCVD risk >=7.5% to <20%) adults or selected borderline-risk (10-year ASCVD risk >=5% to <7.5%) adults in whom a CAC score is measured for the purpose of making a treatment decision the following recommendations have been made: If CAC = 0, it is reasonable to withhold statin therapy and reassess in 5 to 10 years, as long as higher risk conditions are absent (diabetes mellitus, family history of premature CHD in first degree relatives (males <55 years; females <65 years), cigarette smoking, LDL >=190 mg/dL or other independent risk factors). If CAC is 1 to 99, it is reasonable to initiate statin therapy for patients >=48 years of age. If CAC is >=100 or >=75th percentile, it is reasonable to initiate statin therapy at any age. Cardiology referral should be considered for patients with CAC scores >=400 or >=75th percentile. *2018 AHA/ACC/AACVPR/AAPA/ABC/ACPM/ADA/AGS/APhA/ASPC/NLA/PCNA Guideline on the Management of Blood Cholesterol: A Report of the American College of Cardiology/American Heart Association Task Force on Clinical Practice Guidelines. J Am Coll Cardiol. 2019;73(24):3168-3209. Mahesh  Chandrasekhar Electronically Signed: By: Rudean Haskell M.D. On: 05/27/2021 15:19   CT ANGIO CHEST AORTA W/CM & OR WO/CM  Result Date:  05/28/2021 CLINICAL DATA:  80 year old female with history of non rheumatic aortic valve insufficiency. Preprocedural study prior to potential transcatheter aortic valve replacement (TAVR) procedure. EXAM: CT ANGIOGRAPHY CHEST, ABDOMEN AND PELVIS TECHNIQUE: Multidetector CT imaging through the chest, abdomen and pelvis was performed using the standard protocol during bolus administration of intravenous contrast. Multiplanar reconstructed images and MIPs were obtained and reviewed to evaluate the vascular anatomy. CONTRAST:  158mL OMNIPAQUE IOHEXOL 350 MG/ML SOLN COMPARISON:  CTA of the chest, abdomen and pelvis 06/06/2020. FINDINGS: CTA CHEST FINDINGS Cardiovascular: Heart size is normal. There is no significant pericardial fluid, thickening or pericardial calcification. Atherosclerotic calcifications in the thoracic aorta. No definite coronary artery calcifications. Status post median sternotomy for Bentall procedure with bioprosthetic aortic valve noted. 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 Mediastinum/Lymph Nodes: No pathologically enlarged mediastinal or hilar lymph nodes. Esophagus is unremarkable in appearance. No axillary lymphadenopathy. Lungs/Pleura: There is a mosaic attenuation throughout the lung parenchyma, suggesting areas of air trapping from small airways disease. No acute consolidative airspace disease. No pleural effusions. No suspicious appearing pulmonary nodules or masses are noted Musculoskeletal/Soft Tissues: Median sternotomy wires. There are no aggressive appearing lytic or blastic lesions noted in the visualized portions of the skeleton. CTA ABDOMEN AND PELVIS FINDINGS Hepatobiliary: No suspicious cystic or solid hepatic lesions. No intra or extrahepatic biliary ductal dilatation. Gallbladder is normal in appearance. Pancreas: No pancreatic mass. No pancreatic ductal dilatation. No pancreatic or peripancreatic fluid collections or  inflammatory changes. Spleen: Unremarkable. Adrenals/Urinary Tract: Low-attenuation lesions in both kidneys measuring up to 1.7 cm in the upper pole of the left kidney, compatible with simple cysts. No suspicious renal lesions. No hydroureteronephrosis. Urinary bladder is normal in appearance. Bilateral adrenal glands are normal in appearance. Stomach/Bowel: The appearance of the stomach is normal. There is no pathologic dilatation of small bowel or colon. Numerous colonic diverticulae are noted, particularly in the sigmoid colon, without surrounding inflammatory changes to suggest an acute diverticulitis at this time. The appendix is not confidently identified and may be surgically absent. Regardless, there are no inflammatory changes noted adjacent to the cecum to suggest the presence  of an acute appendicitis at this time. Vascular/Lymphatic: Aortic atherosclerosis, with vascular findings and measurements pertinent to potential TAVR procedure, as detailed below. No aneurysm or dissection noted in the abdominal or pelvic vasculature. No lymphadenopathy noted in the abdomen or pelvis. Reproductive: Status post hysterectomy. Ovaries are not confidently identified may be surgically absent or atrophic. Other: No significant volume of ascites.  No pneumoperitoneum. Musculoskeletal: There are no aggressive appearing lytic or blastic lesions noted in the visualized portions of the skeleton. VASCULAR MEASUREMENTS PERTINENT TO TAVR: AORTA: Minimal Aortic Diameter-13 x 12 mm Severity of Aortic Calcification-mild RIGHT PELVIS: Right Common Iliac Artery - Minimal Diameter-9.3 x 8.2 mm Tortuosity-mild Calcification-mild Right External Iliac Artery - Minimal Diameter-7.4 x 7.3 mm Tortuosity-severe Calcification-none Right Common Femoral Artery - Minimal Diameter-6.6 x 7.0 mm Tortuosity-mild Calcification-none LEFT PELVIS: Left Common Iliac Artery - Minimal Diameter-9.7 x 8.9 mm Tortuosity-severe Calcification-mild Left External  Iliac Artery - Minimal Diameter-7.7 x 7.4 mm Tortuosity-moderate Calcification-none Left Common Femoral Artery - Minimal Diameter-7.5 x 7.7 mm Tortuosity-mild Calcification-none Review of the MIP images confirms the above findings. IMPRESSION: 1. Vascular findings and measurements pertinent to potential TAVR procedure, as detailed above. 2. Status post Bentall procedure. 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 Recommend semi-annual imaging followup by CTA or MRA and referral to cardiothoracic surgery if not already obtained. This recommendation follows 2010 ACCF/AHA/AATS/ACR/ASA/SCA/SCAI/SIR/STS/SVM Guidelines for the Diagnosis and Management of Patients With Thoracic Aortic Disease. Circulation. 2010; 121: D149-F02. Aortic aneurysm NOS (ICD10-I71.9). 3. The appearance of the lungs suggests air trapping from small airways disease. 4. Aortic atherosclerosis. 5. Colonic diverticulosis without evidence of acute diverticulitis at this time. 6. Additional incidental findings, as above. Electronically Signed   By: Vinnie Langton M.D.   On: 05/28/2021 10:48   ECHOCARDIOGRAM LIMITED  Result Date: 06/19/2021    ECHOCARDIOGRAM LIMITED REPORT   Patient Name:   [name] [name] Date of Exam: 06/19/2021 Medical Rec #:  [id number]         Height:       61.0 in Accession #:    [id number]        Weight:       110.0 lb Date of Birth:  10/16/1940         BSA:          1.465 m Patient Age:    80 years          BP:           157/32 mmHg Patient Gender: F                 HR:           66 bpm. Exam Location:  Inpatient Procedure: Transesophageal Echo, Cardiac Doppler and Color Doppler Indications:     Nonrheumatic aortic valve disorder I35.9  History:         Patient has prior history of Echocardiogram examinations, most                  recent 03/27/2021. Arrythmias:Atrial Fibrillation. Ascending                  aortic root repair in 2013. GERD. Chronic kidney disease.                   Aortic Valve: Valve in Valve TAVR original valve 21 mm Edwards                  Perimount  L7200 bioprosthetic placed on 07/07/2012. 50mm                  Edwards Sapien valve placed on 06/19/2021 valve is present in                  the aortic position.  Sonographer:     Darlina Sicilian RDCS Referring Phys:  [place] Diagnosing Phys: Rudean Haskell MD IMPRESSIONS  1. Prior to the procedure there was a 21 mm Edards Perimount L7200 bioprosthetic. Aortic valve regurgitation was moderate, eccentric but central in nature with a regurgitant fraction of 34%. Mean gradient of 22 mmHg, Peak gradient of 43 mmHg, DVI 0.43, EOA 1.9 cm2 but in the setting of significant aortic regurgitation. EOA by 2D planimetry 0.9 cm2.  2. Post procedure there was a 90mm Edwards Sapien valve placed on 06/19/2021 valve present in the aortic position. Mean gradients improved to 10 mmHg, Peak gradient 19 mmHg, DVI 0.61, EOA 2.8 cm2. There is mild PVL at the 11-12 o'clock position. Improvement in aortic regurgitation and stenosis post procedure.  3. Left ventricular ejection fraction, by estimation, is 55 to 60%. The left ventricle has normal function. The left ventricle has no regional wall motion abnormalities.  4. The mitral valve is normal in structure. Mild mitral valve regurgitation.  5. Right ventricular systolic function is mildly reduced. The right ventricular size is normal. FINDINGS  Left Ventricle: Left ventricular ejection fraction, by estimation, is 55 to 60%. The left ventricle has normal function. The left ventricle has no regional wall motion abnormalities. The left ventricular internal cavity size was normal in size. Right Ventricle: The right ventricular size is normal. Right ventricular systolic function is mildly reduced. Mitral Valve: The mitral valve is normal in structure. Mild mitral valve regurgitation. Aortic Valve: Aortic valve regurgitation is moderate. Aortic regurgitation PHT measures 259  msec. Aortic valve mean gradient measures 13.0 mmHg. Aortic valve peak gradient measures 23.9 mmHg. Aortic valve area, by VTI measures 2.61 cm. There is a Valve in Valve TAVR original valve 21 mm Edwards Perimount L7200 bioprosthetic placed on 07/07/2012. 68mm Edwards Sapien valve placed on 06/19/2021 valve present in the aortic position. Pulmonic Valve: Pulmonic valve regurgitation is trivial. LEFT VENTRICLE PLAX 2D LVOT diam:     2.40 cm LV SV:         160 LV SV Index:   109 LVOT Area:     4.52 cm  AORTIC VALVE AV Area (Vmax):    2.56 cm AV Area (Vmean):   2.39 cm AV Area (VTI):     2.61 cm AV Vmax:           244.33 cm/s AV Vmean:          165.000 cm/s AV VTI:            0.613 m AV Peak Grad:      23.9 mmHg AV Mean Grad:      13.0 mmHg LVOT Vmax:         138.50 cm/s LVOT Vmean:        87.050 cm/s LVOT VTI:          0.354 m LVOT/AV VTI ratio: 0.58 AI PHT:            259 msec  SHUNTS Systemic VTI:  0.35 m Systemic Diam: 2.40 cm Rudean Haskell MD Electronically signed by Rudean Haskell MD Signature Date/Time: 06/19/2021/7:15:32 PM    Final    Structural Heart Procedure  Result  Date: 06/19/2021 See surgical note for result.  CT ANGIO ABDOMEN PELVIS  W &/OR WO CONTRAST  Result Date: 05/28/2021 CLINICAL DATA:  80 year old female with history of non rheumatic aortic valve insufficiency. Preprocedural study prior to potential transcatheter aortic valve replacement (TAVR) procedure. EXAM: CT ANGIOGRAPHY CHEST, ABDOMEN AND PELVIS TECHNIQUE: Multidetector CT imaging through the chest, abdomen and pelvis was performed using the standard protocol during bolus administration of intravenous contrast. Multiplanar reconstructed images and MIPs were obtained and reviewed to evaluate the vascular anatomy. CONTRAST:  153mL OMNIPAQUE IOHEXOL 350 MG/ML SOLN COMPARISON:  CTA of the chest, abdomen and pelvis 06/06/2020. FINDINGS: CTA CHEST FINDINGS Cardiovascular: Heart size is normal. There is no significant  pericardial fluid, thickening or pericardial calcification. Atherosclerotic calcifications in the thoracic aorta. No definite coronary artery calcifications. Status post median sternotomy for Bentall procedure with bioprosthetic aortic valve noted. 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 Mediastinum/Lymph Nodes: No pathologically enlarged mediastinal or hilar lymph nodes. Esophagus is unremarkable in appearance. No axillary lymphadenopathy. Lungs/Pleura: There is a mosaic attenuation throughout the lung parenchyma, suggesting areas of air trapping from small airways disease. No acute consolidative airspace disease. No pleural effusions. No suspicious appearing pulmonary nodules or masses are noted Musculoskeletal/Soft Tissues: Median sternotomy wires. There are no aggressive appearing lytic or blastic lesions noted in the visualized portions of the skeleton. CTA ABDOMEN AND PELVIS FINDINGS Hepatobiliary: No suspicious cystic or solid hepatic lesions. No intra or extrahepatic biliary ductal dilatation. Gallbladder is normal in appearance. Pancreas: No pancreatic mass. No pancreatic ductal dilatation. No pancreatic or peripancreatic fluid collections or inflammatory changes. Spleen: Unremarkable. Adrenals/Urinary Tract: Low-attenuation lesions in both kidneys measuring up to 1.7 cm in the upper pole of the left kidney, compatible with simple cysts. No suspicious renal lesions. No hydroureteronephrosis. Urinary bladder is normal in appearance. Bilateral adrenal glands are normal in appearance. Stomach/Bowel: The appearance of the stomach is normal. There is no pathologic dilatation of small bowel or colon. Numerous colonic diverticulae are noted, particularly in the sigmoid colon, without surrounding inflammatory changes to suggest an acute diverticulitis at this time. The appendix is not confidently identified and may be surgically absent. Regardless, there are no  inflammatory changes noted adjacent to the cecum to suggest the presence of an acute appendicitis at this time. Vascular/Lymphatic: Aortic atherosclerosis, with vascular findings and measurements pertinent to potential TAVR procedure, as detailed below. No aneurysm or dissection noted in the abdominal or pelvic vasculature. No lymphadenopathy noted in the abdomen or pelvis. Reproductive: Status post hysterectomy. Ovaries are not confidently identified may be surgically absent or atrophic. Other: No significant volume of ascites.  No pneumoperitoneum. Musculoskeletal: There are no aggressive appearing lytic or blastic lesions noted in the visualized portions of the skeleton. VASCULAR MEASUREMENTS PERTINENT TO TAVR: AORTA: Minimal Aortic Diameter-13 x 12 mm Severity of Aortic Calcification-mild RIGHT PELVIS: Right Common Iliac Artery - Minimal Diameter-9.3 x 8.2 mm Tortuosity-mild Calcification-mild Right External Iliac Artery - Minimal Diameter-7.4 x 7.3 mm Tortuosity-severe Calcification-none Right Common Femoral Artery - Minimal Diameter-6.6 x 7.0 mm Tortuosity-mild Calcification-none LEFT PELVIS: Left Common Iliac Artery - Minimal Diameter-9.7 x 8.9 mm Tortuosity-severe Calcification-mild Left External Iliac Artery - Minimal Diameter-7.7 x 7.4 mm Tortuosity-moderate Calcification-none Left Common Femoral Artery - Minimal Diameter-7.5 x 7.7 mm Tortuosity-mild Calcification-none Review of the MIP images confirms the above findings. IMPRESSION: 1. Vascular findings and measurements pertinent to potential TAVR procedure, as detailed above. 2. Status post Bentall procedure. 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 Recommend semi-annual imaging followup by CTA or MRA and referral to cardiothoracic surgery if not already obtained. This recommendation follows 2010 ACCF/AHA/AATS/ACR/ASA/SCA/SCAI/SIR/STS/SVM Guidelines for the Diagnosis and Management of Patients With  Thoracic Aortic Disease. Circulation. 2010; 121: J825-K53. Aortic aneurysm NOS (ICD10-I71.9). 3. The appearance of the lungs suggests air trapping from small airways disease. 4. Aortic atherosclerosis. 5. Colonic diverticulosis without evidence of acute diverticulitis at this time. 6. Additional incidental findings, as above. Electronically Signed   By: Vinnie Langton M.D.   On: 05/28/2021 10:48   Disposition   Pt is being discharged home today in good condition.  Follow-up Plans & Appointments     Follow-up Information     Tommie Raymond, NP. Go on 06/27/2021.   Specialty: Cardiology Why: at 1:30pm. Please arrive to your appointment by 1:15pm. Contact information: [place] [address] [phone]                  Discharge Medications   Allergies as of 06/20/2021       Reactions   Other Nausea And Vomiting   Headache also  Also from artificial sweeetners   Codeine Nausea And Vomiting        Medication List     TAKE these medications    albuterol 108 (90 Base) MCG/ACT inhaler Commonly known as: VENTOLIN HFA Inhale 2 puffs into the lungs every 6 (six) hours as needed for wheezing or shortness of breath.   amiodarone 200 MG tablet Commonly known as: PACERONE Take 1/2 tablet by mouth daily only on Monday, Wednesday, Friday and Saturday   apixaban 2.5 MG Tabs tablet Commonly known as: ELIQUIS Take 1 tablet (2.5 mg total) by mouth 2 (two) times daily.   azelastine 0.1 % nasal spray Commonly known as: ASTELIN Place 2 sprays into both nostrils daily as needed for allergies.   b complex vitamins capsule Take 1 capsule by mouth daily.   CALTRATE MINIS PLUS MINERALS PO Take 40 mcg by mouth every morning.   cholecalciferol 25 MCG (1000 UNIT) tablet Commonly known as: VITAMIN D Take 1,000 Units by mouth every evening.   Farxiga 10 MG Tabs tablet Generic drug: dapagliflozin propanediol Take 10 mg by mouth daily.   Hemocyte  Plus 106-1 MG Caps Take 1 capsule by mouth daily.   ipratropium 0.06 % nasal spray Commonly known as: ATROVENT 2 sprays in each nostril every 6 hours if needed   ipratropium-albuterol 0.5-2.5 (3) MG/3ML Soln Commonly known as: DUONEB Inhale 3 mLs into the lungs every 6 (six) hours as needed (shortness of breath or wheezing).   metoprolol tartrate 25 MG tablet Commonly known as: LOPRESSOR Take 1 tablet (25 mg total) by mouth 2 (two) times daily.   multivitamin-lutein Caps capsule Take 1 capsule by mouth daily.   pantoprazole 40 MG tablet Commonly known as: PROTONIX TAKE ONE TABLET BY MOUTH ONCE DAILY BEFORE meal of choice   pravastatin 10 MG tablet Commonly known as: PRAVACHOL Take 10 mg by mouth every evening.   SUMAtriptan 100 MG tablet Commonly known as: IMITREX Take 100 mg by mouth every 2 (two) hours as needed for migraine. May repeat in 2 hours if headache persists or recurs.   Systane 0.4-0.3 % Soln Generic drug: Polyethyl Glycol-Propyl Glycol Place 1 drop into both eyes 2 (two) times daily as needed (dry eyes).   torsemide 10 MG tablet Commonly known as: DEMADEX Take  2 tablets (20 mg total) by mouth every morning.   triamcinolone cream 0.1 % Commonly known as: KENALOG Apply 1 application topically daily as needed for rash.       Outstanding Labs/Studies   None   Duration of Discharge Encounter   Greater than 30 minutes including physician time.  Signed, Angelena Form, PA-C 06/20/2021, 10:55 AM [phone]   I have personally seen and examined this patient. I agree with the assessment and plan as outlined above.  Doing well one day post TAVR. BP stable. Tele reviewed. Sinus with no high grade AV block. Groins stable. O2 sats low 90s on RA. She did get one dose of IV Lasix yesterday due to elevated LVEDP.  Echo today. If stable, resume home Lasix and d/c home today.   Lauree Chandler 06/20/2021 11:39 AM

## 2021-06-20 NOTE — Progress Notes (Signed)
Discharge instructions (including medications) discussed with and copy provided to patient/caregiver 

## 2021-06-20 NOTE — Progress Notes (Signed)
CARDIAC REHAB PHASE I   PRE:  Rate/Rhythm: 71 SB  BP:  Sitting: 97/46      SaO2: 95 RA  MODE:  Ambulation: 300 ft   POST:  Rate/Rhythm: 77 SR  BP:  Sitting: 128/57    SaO2: 83 RA --> 94 RA   Pt ambulated 358ft in hallway standby assist with slow gait. Pt took one short rest break c/o unsteadiness. Pt sats on return from walk 83 on RA, quickly able to recover to mid 90s. Pt states she has a way to monitor her oxygen at home. Reviewed site care, restrictions and exercise guidelines. Will refer to [facility] [place].  [id number] [name] Falco, RN BSN 06/20/2021 10:55 AM

## 2021-06-20 NOTE — Progress Notes (Signed)
Patient ambulated in hall 500 ft, independent after set-up. Patient required 2 liters of oxygen, saturation level low 90s, heart rate in the 70s. Patient complaint of dryness at the nares, humidification added to oxygen. Patient returned to room and sitting on side of the bed. Patient desats on room air to low 80's. Patient placed on 2 liters of oxygen and oxygen level now in upper 90s. Will continue to monitor.

## 2021-06-20 NOTE — Plan of Care (Signed)
  Problem: Education: Goal: Knowledge of General Education information will improve Description: Including pain rating scale, medication(s)/side effects and non-pharmacologic comfort measures Outcome: Adequate for Discharge   

## 2021-06-21 ENCOUNTER — Telehealth: Payer: Self-pay | Admitting: Cardiology

## 2021-06-21 ENCOUNTER — Telehealth (HOSPITAL_COMMUNITY): Payer: Self-pay

## 2021-06-21 NOTE — Telephone Encounter (Signed)
Per phase I cardiac rehab, fax cardiac rehab referral to [place] [facility]. °

## 2021-06-21 NOTE — Telephone Encounter (Signed)
  [place] VALVE TEAM   Patient contacted regarding discharge from [facility] on 06/20/21.  Patient understands to follow up with a structural heart APP on Kathyrn Drown, NP-C at 1:30pm at [facility].  Patient understands discharge instructions? yes Patient understands medications and regimen? yes Patient understands to bring all medications to this visit? yes  Kathyrn Drown NP-C Structural Heart Team  Pager: [phone]

## 2021-06-27 ENCOUNTER — Other Ambulatory Visit: Payer: Self-pay

## 2021-06-27 ENCOUNTER — Ambulatory Visit (INDEPENDENT_AMBULATORY_CARE_PROVIDER_SITE_OTHER): Payer: Medicare Other | Admitting: Cardiology

## 2021-06-27 ENCOUNTER — Encounter: Payer: Self-pay | Admitting: Cardiology

## 2021-06-27 VITALS — BP 92/54 | HR 56 | Ht 61.0 in | Wt 107.0 lb

## 2021-06-27 DIAGNOSIS — E782 Mixed hyperlipidemia: Secondary | ICD-10-CM

## 2021-06-27 DIAGNOSIS — Z952 Presence of prosthetic heart valve: Secondary | ICD-10-CM | POA: Diagnosis not present

## 2021-06-27 DIAGNOSIS — I5033 Acute on chronic diastolic (congestive) heart failure: Secondary | ICD-10-CM

## 2021-06-27 DIAGNOSIS — N289 Disorder of kidney and ureter, unspecified: Secondary | ICD-10-CM | POA: Diagnosis not present

## 2021-06-27 DIAGNOSIS — I7121 Aneurysm of the ascending aorta, without rupture: Secondary | ICD-10-CM | POA: Diagnosis not present

## 2021-06-27 DIAGNOSIS — I351 Nonrheumatic aortic (valve) insufficiency: Secondary | ICD-10-CM

## 2021-06-27 DIAGNOSIS — I480 Paroxysmal atrial fibrillation: Secondary | ICD-10-CM

## 2021-06-27 MED ORDER — METOPROLOL TARTRATE 25 MG PO TABS: 12.5000 mg | ORAL_TABLET | Freq: Two times a day (BID) | ORAL | 3 refills | Status: DC

## 2021-06-27 NOTE — Patient Instructions (Addendum)
Medication Instructions:  Your physician has recommended you make the following change in your medication:   ** Decrease your Metoprolol Tartrate 25mg  twice daily to12.5mg  (1/2 tablet by mouth twice daily)  *If you need a refill on your cardiac medications before your next appointment, please call your pharmacy*   Lab Work: None ordered.  If you have labs (blood work) drawn today and your tests are completely normal, you will receive your results only by: [place] (if you have MyChart) OR A paper copy in the mail If you have any lab test that is abnormal or we need to change your treatment, we will call you to review the results.   Testing/Procedures: None ordered.    Follow-Up: At [facility], you and your health needs are our priority.  As part of our continuing mission to provide you with exceptional heart care, we have created designated Provider Care Teams.  These Care Teams include your primary Cardiologist (physician) and Advanced Practice Providers (APPs -  Physician Assistants and Nurse Practitioners) who all work together to provide you with the care you need, when you need it.  We recommend signing up for the patient portal called "MyChart".  Sign up information is provided on this After Visit Summary.  MyChart is used to connect with patients for Virtual Visits (Telemedicine).  Patients are able to view lab/test results, encounter notes, upcoming appointments, etc.  Non-urgent messages can be sent to your provider as well.   To learn more about what you can do with MyChart, go to [email or web address].    Your next appointment:   Follow up as scheduled

## 2021-06-27 NOTE — Progress Notes (Signed)
[facility] [place]                                     Cardiology Office Note:    Date:  06/27/2021   ID:  AMENDA DUCLOS, DOB [birth year], MRN [id number]  PCP:  [name], MD  [facility] Cardiologist:  Quay Burow, MD / Dr. Angelena Form & Dr. Cyndia Bent (TAVR)  [facility] Electrophysiologist:  None   Referring MD: [name], MD   Chief Complaint  Patient presents with   Follow-up    1 week s/p TAVR   History of Present Illness:    [name] [name] is a 80 y.o. female with a hx of PAF on amio & Eliquis, thoracic aortic aneurysm, CKD stage IIIb, aortic valve disease s/p bioprosthetic AVR (21 mm Edwards Perimount G4010 bioprosthetic valve-Dr. 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    She is status post biological Bentall procedure in 2004 by Dr. Prescott Gum. She has a 21 mm Edwards Perimount 7200 bioprosthetic valve.  She had an echocardiogram on 03/06/2021 that showed the bioprosthetic aortic valve to be heavily calcified. The mean gradient was 28 mmHg with at least moderate eccentric aortic insufficiency.  Aortic root was noted to be 40 mm. She underwent TEE on 03/27/2021 for further evaluation of the valve which showed moderate to severe insufficiency with no paravalvular leak.  The mean gradient was 13.4 mmHg with an AI pressure half-time of 336 ms.  Left ventricular ejection fraction was 60 to 65%.  There is grade 2 diastolic dysfunction.  There was mild to moderate mitral regurgitation. [facility] 04/19/21 showed no CAD. Hemodynamic findings consistent with moderate pulmonary hypertension and elevated right heart pressures. Her torsemide was increased to 20 mg daily at that time.    The patient was evaluated by the multidisciplinary valve team and felt to have severe, symptomatic aortic stenosis. She underwent TAVR  06/19/21 with a 23 mm Edwards Sapien 3 Ultra Resilia THV via the TF approach. Post operative echo showed stable valve placement with mean gradient at 75mmHg, peak at 68mmHg, AVA by VTI 0.41 and no PVL. She was resumed on home Eliquis at prior dose.   Today, she presents alone and reports that she has been doing well since discharge. She states that she is able to climb her stairs with no SOB and no longer needs to stop for a break. BP is on the softer side today and she reports some mild dizziness with change pf position. We discussed making a change to her medications. She has been taking SBE with Amox given prior AVR however not on her current medication list whih we will update. She denies chest pain, SOB, palpitations, LE edema, orthopnea, or syncope.    Past Medical History:  Diagnosis Date   Anemia    Aortic root dilatation ([place]) 05/11/2012   CTA CHEST   Arthritis    Cardiac asystole, post DCCV requiring use of temp. external pacemaker leads 07/19/2012   Chronic kidney disease    Stage 4   GERD (gastroesophageal reflux disease)    H/O aortic valve disorder 10/04/2002   Dr. Tharon Aquas Trigt   History of migraine headaches    Hyperlipidemia    on statin therapy   Migraines  PAF (paroxysmal atrial fibrillation) (HCC)    Prosthetic valve dysfunction    S/P valve in valve TAVR (transcatheter aortic valve replacement) 06/19/2021   s/p valve in valve TAVR with a 14mm Edwards S3UR via the TF approach via the TF approach by Drs. McAlhany & Bartle   Thoracic ascending aortic aneurysm    CTA CHEST 05/11/12    Past Surgical History:  Procedure Laterality Date   ABDOMINAL HYSTERECTOMY     AORTIC VALVE REPLACEMENT  10/04/2002    Dr. 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   INTRAOPERATIVE TRANSTHORACIC ECHOCARDIOGRAM N/A 06/19/2021   Procedure: INTRAOPERATIVE TRANSTHORACIC ECHOCARDIOGRAM;  Surgeon: Burnell Blanks, MD;  Location: [facility];  Service: Open Heart Surgery;  Laterality: N/A;   LEFT AND RIGHT HEART CATHETERIZATION WITH CORONARY ANGIOGRAM N/A 06/18/2012   Procedure: LEFT AND RIGHT HEART CATHETERIZATION WITH CORONARY ANGIOGRAM;  Surgeon: Lorretta Harp, MD;  Location: [facility] CATH LAB;  Service: Cardiovascular;  Laterality: N/A;   NM MYOCAR PERF WALL MOTION  10/29/2010   normal   REPLACEMENT ASCENDING AORTA  07/07/2012   Procedure: REPLACEMENT ASCENDING AORTA;  Surgeon: Ivin Poot, MD;  Location: [facility];  Service: Open Heart Surgery;  Laterality: N/A;  CIRCULATORY ARREST   RIGHT HEART CATH AND CORONARY ANGIOGRAPHY N/A 04/19/2021   Procedure: RIGHT HEART CATH AND CORONARY ANGIOGRAPHY;  Surgeon: Burnell Blanks, MD;  Location: [facility] CV LAB;  Service: Cardiovascular;  Laterality: N/A;   TEE WITHOUT CARDIOVERSION  06/18/2012   Procedure: TRANSESOPHAGEAL ECHOCARDIOGRAM (TEE);  Surgeon: Sanda Klein, MD;  Location: [facility] ENDOSCOPY;  Service: Cardiovascular;  Laterality: N/A;  right and left heart cath after this procedure   TEE WITHOUT CARDIOVERSION  07/15/2012   Procedure: TRANSESOPHAGEAL ECHOCARDIOGRAM (TEE);  Surgeon: Pixie Casino, MD;  Location: [facility] ENDOSCOPY;  Service: Cardiovascular;  Laterality: N/A;   TEE WITHOUT CARDIOVERSION N/A 03/27/2021   Procedure: TRANSESOPHAGEAL ECHOCARDIOGRAM (TEE);  Surgeon: Sanda Klein, MD;  Location: [facility];  Service: Cardiovascular;  Laterality: N/A;   TONSILLECTOMY     TRANSCATHETER AORTIC VALVE REPLACEMENT, TRANSFEMORAL N/A 06/19/2021   Procedure: TRANSCATHETER AORTIC VALVE REPLACEMENT, TRANSFEMORAL USING A 23MM EDWARDS VALVE;   Surgeon: Burnell Blanks, MD;  Location: [facility];  Service: Open Heart Surgery;  Laterality: N/A;    Current Medications: Current Meds  Medication Sig   albuterol (VENTOLIN HFA) 108 (90 Base) MCG/ACT inhaler Inhale 2 puffs into the lungs every 6 (six) hours as needed for wheezing or shortness of breath.   amiodarone (PACERONE) 200 MG tablet Take 1/2 tablet by mouth daily only on Monday, Wednesday, Friday and Saturday   amoxicillin (AMOXIL) 500 MG tablet Take 500 mg by mouth. Pt will need Amoxicillin 2 grams prior to dental procedures   apixaban (ELIQUIS) 2.5 MG TABS tablet Take 1 tablet (2.5 mg total) by mouth 2 (two) times daily.   azelastine (ASTELIN) 0.1 % nasal spray Place 2 sprays into both nostrils daily as needed for allergies.   b complex vitamins capsule Take 1 capsule by mouth daily.   Calcium Carbonate-Vit D-Min (CALTRATE MINIS PLUS MINERALS PO) Take 40  mcg by mouth every morning.   cholecalciferol (VITAMIN D) 25 MCG (1000 UNIT) tablet Take 1,000 Units by mouth every evening.   FARXIGA 10 MG TABS tablet Take 10 mg by mouth daily.   Fe Fum-FA-B Cmp-C-Zn-Mg-Mn-Cu (HEMOCYTE PLUS) 106-1 MG CAPS Take 1 capsule by mouth daily.   ipratropium (ATROVENT) 0.06 % nasal spray 2 sprays in each nostril every 6 hours if needed   ipratropium-albuterol (DUONEB) 0.5-2.5 (3) MG/3ML SOLN Inhale 3 mLs into the lungs every 6 (six) hours as needed (shortness of breath or wheezing).   multivitamin-lutein (OCUVITE-LUTEIN) CAPS capsule Take 1 capsule by mouth daily.   pantoprazole (PROTONIX) 40 MG tablet TAKE ONE TABLET BY MOUTH ONCE DAILY BEFORE meal of choice   Polyethyl Glycol-Propyl Glycol (SYSTANE) 0.4-0.3 % SOLN Place 1 drop into both eyes 2 (two) times daily as needed (dry eyes).   pravastatin (PRAVACHOL) 10 MG tablet Take 10 mg by mouth every evening.   SUMAtriptan (IMITREX) 100 MG tablet Take 100 mg by mouth every 2 (two) hours as needed for migraine. May repeat in 2 hours if headache persists  or recurs.   torsemide (DEMADEX) 10 MG tablet Take 2 tablets (20 mg total) by mouth every morning.   [DISCONTINUED] metoprolol tartrate (LOPRESSOR) 25 MG tablet Take 1 tablet (25 mg total) by mouth 2 (two) times daily.   [DISCONTINUED] triamcinolone cream (KENALOG) 0.1 % Apply 1 application topically daily as needed for rash.     Allergies:   Other and Codeine   Social History   Socioeconomic History   Marital status: Widowed    Spouse name: Not on file   Number of children: 1   Years of education: Not on file   Highest education level: Not on file  Occupational History   Occupation: Retired-realtor  Tobacco Use   Smoking status: Never   Smokeless tobacco: Never  Vaping Use   Vaping Use: Never used  Substance and Sexual Activity   Alcohol use: No   Drug use: No   Sexual activity: Not Currently  Other Topics Concern   Not on file  Social History Narrative   Not on file   Social Determinants of Health   Financial Resource Strain: Not on file  Food Insecurity: Not on file  Transportation Needs: Not on file  Physical Activity: Not on file  Stress: Not on file  Social Connections: Not on file     Family History: The patient's family history includes Heart attack in her father.  ROS:   Please see the history of present illness.    All other systems reviewed and are negative.  EKGs/Labs/Other Studies Reviewed:    The following studies were reviewed today:  Echocardiogram 06/20/21:   1. There is a 23 mm Edwards Sapien prosthetic (TAVR) valve present in the  aortic position. Effective orifice area, by VTI measures 1.29 cm. Aortic  valve mean gradient measures 26 mmHg, peak gradient 36 mmHg. Aortic valve  Vmax measures 3.12 m/s. DVI  0.41. No PVL.   2. Left ventricular ejection fraction, by estimation, is 60 to 65%. The  left ventricle has normal function. The left ventricle has no regional  wall motion abnormalities. There is moderate concentric left ventricular   hypertrophy. Left ventricular  diastolic parameters are consistent with Grade II diastolic dysfunction  (pseudonormalization).   3. Right ventricular systolic function is normal. The right ventricular  size is normal. There is normal pulmonary artery systolic pressure. The  estimated right ventricular systolic pressure is 29.0  mmHg.   4. Left atrial size was severely dilated.   5. Right atrial size was mildly dilated.   6. The mitral valve is normal in structure. Trivial mitral valve  regurgitation. No evidence of mitral stenosis.    TAVR OPERATIVE NOTE     Date of Procedure:                06/19/2021   Preoperative Diagnosis:      Severe Aortic Stenosis    Postoperative Diagnosis:    Same    Procedure:        Transcatheter Aortic Valve in Valve Replacement - Percutaneous Left Transfemoral Approach             Edwards Sapien 3 Ultra ResiliaTHV (size 23 mm, model # 9755RSL, serial # [id number])              Co-Surgeons:                        Gaye Pollack, MD and /  Lauree Chandler, MD     Anesthesiologist:                  Raechel Ache, MD   Echocardiographer:              Osborne Oman, MD   Pre-operative Echo Findings: Severe aortic stenosis Normal left ventricular systolic function   Post-operative Echo Findings: Trivial paravalvular leak Normal left ventricular systolic function    EKG:  EKG is ordered today.  The ekg ordered today demonstrates SB with HR 56bpm, PAC.   Recent Labs: 12/20/2020: TSH 2.414 06/15/2021: ALT 20; B Natriuretic Peptide 2,830.7 06/20/2021: BUN 24; Creatinine, Ser 1.89; Hemoglobin 8.7; Magnesium 2.2; Platelets 153; Potassium 3.5; Sodium 135  Recent Lipid Panel No results found for: CHOL, TRIG, HDL, CHOLHDL, VLDL, LDLCALC, LDLDIRECT   Physical Exam:    VS:  BP (!) 92/54   Pulse (!) 56   Ht 5\' 1"  (1.549 m)   Wt 107 lb (48.5 kg)   SpO2 95%   BMI 20.22 kg/m     Wt Readings from Last 3 Encounters:  06/27/21 107 lb (48.5 kg)   06/20/21 111 lb 9.3 oz (50.6 kg)  06/15/21 109 lb 14.4 oz (49.9 kg)    General: Petite, NAD Neck: Negative for carotid bruits. No JVD Lungs:Clear to ausculation bilaterally. Breathing is unlabored. Cardiovascular: RRR with S1 S2. + murmur Extremities: No edema.  Neuro: Alert and oriented. No focal deficits. No facial asymmetry. MAE spontaneously. Psych: Responds to questions appropriately with normal affect.    ASSESSMENT/PLAN:    Severe AS: s/p successful valve valve TAVR with a 23 mm Edwards Sapien 3 Ultra Resilia THV via the TF approach on 06/19/21. Post operative echo with stable valve placement with mean gradient at 2mmHg, peak at 51mmHg, AVA by VTI 0.41 and no PVL. Resumed on home Eliquis at prior dose. Groin site healing nicely. SBE with Amoxicillin 2g one hour prior to dental cleanings and procedures. Follow up 1 month with echo.    Chronic diastolic CHF: Elevated BNP >2800 on PAT labs prior to TAVR. LVEDP was 26 during TAVR treated with one dose of IV lasix 60 mg daily. Resume home torsemide 20mg  daily and Farixga. Appears euvolemic on exam today. Continue current regimen.     PAF: Maintaining sinus. She is bradycardic today with some mild dizziness on change position. Will decrease metoprolol to 12.5mg  BID. Continue Amio and Eliquis 2.5 mg BID.  TSH 2.41 on 12/20/20 and CXR with no chronic changes.   Hypotension: Runs rather soft at baseline however with some mild dizziness, will decrease metoprolol to 12.5mg  BID.    CKD stage IIIb: Creat 1.89 on hospital discharge which appears to be at her baseline. She sees nephrology in a few weeks.    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 Recommend semi-annual imaging followup by CTA or MRA. This will be followed over time. Plan to re-scan in 6 months.    Medication Adjustments/Labs and Tests Ordered: Current medicines are reviewed at length with the patient  today.  Concerns regarding medicines are outlined above.  Orders Placed This Encounter  Procedures   EKG 12-Lead    Meds ordered this encounter  Medications   metoprolol tartrate (LOPRESSOR) 25 MG tablet    Sig: Take 0.5 tablets (12.5 mg total) by mouth 2 (two) times daily.    Dispense:  180 tablet    Refill:  3     Patient Instructions  Medication Instructions:  Your physician has recommended you make the following change in your medication:   ** Decrease your Metoprolol Tartrate 25mg  twice daily to12.5mg  (1/2 tablet by mouth twice daily)  *If you need a refill on your cardiac medications before your next appointment, please call your pharmacy*   Lab Work: None ordered.  If you have labs (blood work) drawn today and your tests are completely normal, you will receive your results only by: [place] (if you have MyChart) OR A paper copy in the mail If you have any lab test that is abnormal or we need to change your treatment, we will call you to review the results.   Testing/Procedures: None ordered.    Follow-Up: At [facility], you and your health needs are our priority.  As part of our continuing mission to provide you with exceptional heart care, we have created designated Provider Care Teams.  These Care Teams include your primary Cardiologist (physician) and Advanced Practice Providers (APPs -  Physician Assistants and Nurse Practitioners) who all work together to provide you with the care you need, when you need it.  We recommend signing up for the patient portal called "MyChart".  Sign up information is provided on this After Visit Summary.  MyChart is used to connect with patients for Virtual Visits (Telemedicine).  Patients are able to view lab/test results, encounter notes, upcoming appointments, etc.  Non-urgent messages can be sent to your provider as well.   To learn more about what you can do with MyChart, go to [email or web address].    Your next  appointment:   Follow up as scheduled   Signed, Kathyrn Drown, NP  06/27/2021 2:15 PM    [place]

## 2021-07-05 ENCOUNTER — Ambulatory Visit (HOSPITAL_COMMUNITY)
Admission: RE | Admit: 2021-07-05 | Discharge: 2021-07-05 | Disposition: A | Discharge status: Home / Self Care | Payer: Medicare Other | Source: Ambulatory Visit | Attending: Physician Assistant | Admitting: Physician Assistant

## 2021-07-05 ENCOUNTER — Encounter (HOSPITAL_COMMUNITY): Payer: Self-pay | Admitting: Physician Assistant

## 2021-07-05 ENCOUNTER — Other Ambulatory Visit (HOSPITAL_COMMUNITY): Payer: Self-pay | Admitting: Physician Assistant

## 2021-07-05 ENCOUNTER — Other Ambulatory Visit: Payer: Self-pay

## 2021-07-05 VITALS — BP 90/68 | HR 107 | Ht 61.0 in | Wt 106.6 lb

## 2021-07-05 DIAGNOSIS — Z79899 Other long term (current) drug therapy: Secondary | ICD-10-CM | POA: Diagnosis not present

## 2021-07-05 DIAGNOSIS — Z7901 Long term (current) use of anticoagulants: Secondary | ICD-10-CM | POA: Insufficient documentation

## 2021-07-05 DIAGNOSIS — I5032 Chronic diastolic (congestive) heart failure: Secondary | ICD-10-CM | POA: Diagnosis not present

## 2021-07-05 DIAGNOSIS — I350 Nonrheumatic aortic (valve) stenosis: Secondary | ICD-10-CM | POA: Insufficient documentation

## 2021-07-05 DIAGNOSIS — D6869 Other thrombophilia: Secondary | ICD-10-CM | POA: Diagnosis not present

## 2021-07-05 DIAGNOSIS — I484 Atypical atrial flutter: Secondary | ICD-10-CM | POA: Diagnosis not present

## 2021-07-05 DIAGNOSIS — I4819 Other persistent atrial fibrillation: Secondary | ICD-10-CM | POA: Diagnosis not present

## 2021-07-05 DIAGNOSIS — Z953 Presence of xenogenic heart valve: Secondary | ICD-10-CM | POA: Insufficient documentation

## 2021-07-05 DIAGNOSIS — I483 Typical atrial flutter: Secondary | ICD-10-CM | POA: Insufficient documentation

## 2021-07-05 LAB — CBC
HCT: 37.6 % (ref 36.0–46.0)
Hemoglobin: 12 g/dL (ref 12.0–15.0)
MCH: 31.3 pg (ref 26.0–34.0)
MCHC: 31.9 g/dL (ref 30.0–36.0)
MCV: 98.2 fL (ref 80.0–100.0)
Platelets: 200 10*3/uL (ref 150–400)
RBC: 3.83 MIL/uL — ABNORMAL LOW (ref 3.87–5.11)
RDW: 14.2 % (ref 11.5–15.5)
WBC: 5.4 10*3/uL (ref 4.0–10.5)
nRBC: 0 % (ref 0.0–0.2)

## 2021-07-05 LAB — TSH: TSH: 1.986 u[IU]/mL (ref 0.350–4.500)

## 2021-07-05 LAB — COMPREHENSIVE METABOLIC PANEL
ALT: 16 U/L (ref 0–44)
AST: 28 U/L (ref 15–41)
Albumin: 4 g/dL (ref 3.5–5.0)
Alkaline Phosphatase: 65 U/L (ref 38–126)
Anion gap: 11 (ref 5–15)
BUN: 37 mg/dL — ABNORMAL HIGH (ref 8–23)
CO2: 28 mmol/L (ref 22–32)
Calcium: 9.8 mg/dL (ref 8.9–10.3)
Chloride: 100 mmol/L (ref 98–111)
Creatinine, Ser: 2.32 mg/dL — ABNORMAL HIGH (ref 0.44–1.00)
GFR, Estimated: 21 mL/min — ABNORMAL LOW (ref >60)
Glucose, Bld: 140 mg/dL — ABNORMAL HIGH (ref 70–99)
Potassium: 3.6 mmol/L (ref 3.5–5.1)
Sodium: 139 mmol/L (ref 135–145)
Total Bilirubin: 0.6 mg/dL (ref 0.3–1.2)
Total Protein: 7.6 g/dL (ref 6.5–8.1)

## 2021-07-05 MED ORDER — AMIODARONE HCL 200 MG PO TABS: 200.0000 mg | ORAL_TABLET | Freq: Every day | ORAL | 1 refills | Status: DC

## 2021-07-05 NOTE — Progress Notes (Signed)
Primary Care Physician: [name], MD Primary Cardiologist: Dr [name] Primary Electrophysiologist: none Referring Physician: [facility] triage/Dr Viviana Simpler Caldera is a 80 y.o. female with a history of thoracic aortic aneurysm and AS s/p repair 2004, paroxysmal atrial fibrillation, atrial flutter who presents for follow up in the [facility]. The patient was initially diagnosed with atrial fibrillation in 2004 in the setting of her valve surgery. She had done well since that time with no known recurrence of afib. Patient has a CHADS2VASC score of 3. Patient was in her usual state of health until 05/14/20 when she began having heart "fluttering." These symptoms felt similar to her palpitations after her surgery. ECG 05/15/20 shows afib with RVR. There were no triggers that she could identify. She was started on Eliquis for stroke prevention. Patient is s/p DCCV on 06/05/20.   On follow up today, patient seen by Dr [name] and symptoms of fluid retention and SOB were felt to be related to her valve disease. She underwent "valve in valve" TAVR on 06/19/21. She states that since the surgery she feels much better with resolution of her SOB. Over the past week, she has noted times of elevated heart rate. She doesn't feel bad during theses episodes but just feels her heart racing. She also has periods of slow, regular heart rate.   Today, she denies symptoms of chest pain, orthopnea, PND, lower extremity edema, dizziness, presyncope, syncope, snoring, daytime somnolence, bleeding, or neurologic sequela. The patient is tolerating medications without difficulties and is otherwise without complaint today.    Atrial Fibrillation Risk Factors:  she does not have symptoms or diagnosis of sleep apnea. she does not have a history of rheumatic fever. she does not have a history of alcohol use.   she has a BMI of Body mass index is 20.14 kg/m.[name] Filed Weights   07/05/21  1326  Weight: 48.4 kg     Family History  Problem Relation Age of Onset   Heart attack Father      Atrial Fibrillation Management history:  Previous antiarrhythmic drugs: amiodarone Previous cardioversions: 2013, 06/05/20 Previous ablations: none CHADS2VASC score: 4 Anticoagulation history: Eliquis   Past Medical History:  Diagnosis Date   Anemia    Aortic root dilatation (HCC) 05/11/2012   CTA CHEST   Arthritis    Cardiac asystole, post DCCV requiring use of temp. external pacemaker leads 07/19/2012   Chronic kidney disease    Stage 4   GERD (gastroesophageal reflux disease)    H/O aortic valve disorder 10/04/2002   Dr. 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 McAlhany & Bartle   Thoracic ascending aortic aneurysm    CTA CHEST 05/11/12   Past Surgical History:  Procedure Laterality Date   ABDOMINAL HYSTERECTOMY     AORTIC VALVE REPLACEMENT  10/04/2002    Dr. 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   INTRAOPERATIVE TRANSTHORACIC ECHOCARDIOGRAM N/A 06/19/2021   Procedure: INTRAOPERATIVE TRANSTHORACIC ECHOCARDIOGRAM;  Surgeon: Burnell Blanks, MD;  Location: [place];  Service: Open Heart Surgery;  Laterality: N/A;   LEFT AND RIGHT HEART CATHETERIZATION WITH CORONARY ANGIOGRAM N/A  06/18/2012   Procedure: LEFT AND RIGHT HEART CATHETERIZATION WITH CORONARY ANGIOGRAM;  Surgeon: Lorretta Harp, MD;  Location: [facility] CATH LAB;  Service: Cardiovascular;  Laterality: N/A;   NM MYOCAR PERF WALL MOTION  10/29/2010   normal   REPLACEMENT ASCENDING AORTA  07/07/2012   Procedure: REPLACEMENT ASCENDING AORTA;  Surgeon: Ivin Poot, MD;  Location: [place];  Service: Open Heart Surgery;  Laterality: N/A;  CIRCULATORY ARREST   RIGHT HEART CATH AND CORONARY ANGIOGRAPHY N/A 04/19/2021   Procedure: RIGHT HEART CATH AND CORONARY ANGIOGRAPHY;  Surgeon: Burnell Blanks, MD;  Location: [place] CV LAB;  Service: Cardiovascular;  Laterality: N/A;   TEE WITHOUT CARDIOVERSION  06/18/2012   Procedure: TRANSESOPHAGEAL ECHOCARDIOGRAM (TEE);  Surgeon: Sanda Klein, MD;  Location: [facility] ENDOSCOPY;  Service: Cardiovascular;  Laterality: N/A;  right and left heart cath after this procedure   TEE WITHOUT CARDIOVERSION  07/15/2012   Procedure: TRANSESOPHAGEAL ECHOCARDIOGRAM (TEE);  Surgeon: Pixie Casino, MD;  Location: [facility] ENDOSCOPY;  Service: Cardiovascular;  Laterality: N/A;   TEE WITHOUT CARDIOVERSION N/A 03/27/2021   Procedure: TRANSESOPHAGEAL ECHOCARDIOGRAM (TEE);  Surgeon: Sanda Klein, MD;  Location: [place];  Service: Cardiovascular;  Laterality: N/A;   TONSILLECTOMY     TRANSCATHETER AORTIC VALVE REPLACEMENT, TRANSFEMORAL N/A 06/19/2021   Procedure: TRANSCATHETER AORTIC VALVE REPLACEMENT, TRANSFEMORAL USING A 23MM EDWARDS VALVE;  Surgeon: Burnell Blanks, MD;  Location: [place];  Service: Open Heart Surgery;  Laterality: N/A;    Current Outpatient Medications  Medication Sig Dispense Refill   albuterol (VENTOLIN HFA) 108 (90 Base) MCG/ACT inhaler Inhale 2 puffs into the lungs every 6 (six) hours as needed for wheezing or shortness of breath.     amoxicillin (AMOXIL) 500 MG tablet Take 500 mg by mouth. Pt will need Amoxicillin 2 grams prior to dental procedures     apixaban  (ELIQUIS) 2.5 MG TABS tablet Take 1 tablet (2.5 mg total) by mouth 2 (two) times daily. 60 tablet 11   azelastine (ASTELIN) 0.1 % nasal spray Place 2 sprays into both nostrils daily as needed for allergies.     b complex vitamins capsule Take 1 capsule by mouth daily.     Calcium Carbonate-Vit D-Min (CALTRATE MINIS PLUS MINERALS PO) Take 40 mcg by mouth every morning.     cholecalciferol (VITAMIN D) 25 MCG (1000 UNIT) tablet Take 1,000 Units by mouth every evening.     FARXIGA 10 MG TABS tablet Take 10 mg by mouth daily.     Fe Fum-FA-B Cmp-C-Zn-Mg-Mn-Cu (HEMOCYTE PLUS) 106-1 MG CAPS Take 1 capsule by mouth daily.     ipratropium (ATROVENT) 0.06 % nasal spray 2 sprays in each nostril every 6 hours if needed 15 mL 5   ipratropium-albuterol (DUONEB) 0.5-2.5 (3) MG/3ML SOLN Inhale 3 mLs into the lungs every 6 (six) hours as needed (shortness of breath or wheezing).     metoprolol tartrate (LOPRESSOR) 25 MG tablet Take 0.5 tablets (12.5 mg total) by mouth 2 (two) times daily. 180 tablet 3   multivitamin-lutein (OCUVITE-LUTEIN) CAPS capsule Take 1 capsule by mouth  daily.     pantoprazole (PROTONIX) 40 MG tablet TAKE ONE TABLET BY MOUTH ONCE DAILY BEFORE meal of choice 90 tablet 2   Polyethyl Glycol-Propyl Glycol (SYSTANE) 0.4-0.3 % SOLN Place 1 drop into both eyes 2 (two) times daily as needed (dry eyes).     pravastatin (PRAVACHOL) 10 MG tablet Take 10 mg by mouth every evening.     SUMAtriptan (IMITREX) 100 MG tablet Take 100 mg by mouth every 2 (two) hours as needed for migraine. May repeat in 2 hours if headache persists or recurs.     torsemide (DEMADEX) 10 MG tablet Take 2 tablets (20 mg total) by mouth every morning. 90 tablet 1   amiodarone (PACERONE) 200 MG tablet TAKE 1 TABLET BY MOUTH EVERY DAY 90 tablet 2   No current facility-administered medications for this encounter.    Allergies  Allergen Reactions   Other Nausea And Vomiting    Headache also  Also from artificial sweeetners    Codeine Nausea And Vomiting    Social History   Socioeconomic History   Marital status: Widowed    Spouse name: Not on file   Number of children: 1   Years of education: Not on file   Highest education level: Not on file  Occupational History   Occupation: Retired-realtor  Tobacco Use   Smoking status: Never   Smokeless tobacco: Never  Vaping Use   Vaping Use: Never used  Substance and Sexual Activity   Alcohol use: No   Drug use: No   Sexual activity: Not Currently  Other Topics Concern   Not on file  Social History Narrative   Not on file   Social Determinants of Health   Financial Resource Strain: Not on file  Food Insecurity: Not on file  Transportation Needs: Not on file  Physical Activity: Not on file  Stress: Not on file  Social Connections: Not on file  Intimate Partner Violence: Not on file     ROS- All systems are reviewed and negative except as per the HPI above.  Physical Exam: Vitals:   07/05/21 1326  BP: 90/68  Pulse: (!) 107  Weight: 48.4 kg  Height: 5\' 1"  (1.549 m)    GEN- The patient is a well appearing elderly female, alert and oriented x 3 today.   HEENT-head normocephalic, atraumatic, sclera clear, conjunctiva pink, hearing intact, trachea midline. Lungs- Clear to ausculation bilaterally, normal work of breathing Heart- irregular rate and rhythm, no rubs or gallops, systolic murmur   GI- soft, NT, ND, + BS Extremities- no clubbing, cyanosis, or edema MS- no significant deformity or atrophy Skin- no rash or lesion Psych- euthymic mood, full affect Neuro- strength and sensation are intact   Wt Readings from Last 3 Encounters:  07/05/21 48.4 kg  06/27/21 48.5 kg  06/20/21 50.6 kg    EKG today demonstrates  Atypical atrial flutter Vent. rate 107 BPM PR interval 320 ms QRS duration 104 ms QT/QTcB 400/534 ms  Echo 06/20/21 demonstrated   1. There is a 23 mm Edwards Sapien prosthetic (TAVR) valve present in the aortic position.  Effective orifice area, by VTI measures 1.29 cm. Aortic valve mean gradient measures 26 mmHg, peak gradient 36 mmHg. Aortic valve Vmax measures 3.12 m/s. DVI  0.41. No PVL.   2. Left ventricular ejection fraction, by estimation, is 60 to 65%. The  left ventricle has normal function. The left ventricle has no regional  wall motion abnormalities. There is moderate concentric left ventricular hypertrophy. Left  ventricular diastolic parameters are consistent with Grade II diastolic dysfunction  (pseudonormalization).   3. Right ventricular systolic function is normal. The right ventricular  size is normal. There is normal pulmonary artery systolic pressure. The estimated right ventricular systolic pressure is 04.8 mmHg.   4. Left atrial size was severely dilated.   5. Right atrial size was mildly dilated.   6. The mitral valve is normal in structure. Trivial mitral valve  regurgitation. No evidence of mitral stenosis.   Comparison(s): A prior study was performed on 06/19/21. PVL has resolved. DVI is similar to pre-valve in valve procedure, but without central aortic regurgitation.   Epic records are reviewed at length today  CHA2DS2-VASc Score = 4  The patient's score is based upon: CHF History: 1 HTN History: 0 Diabetes History: 0 Stroke History: 0 Vascular Disease History: 0 Age Score: 2 Gender Score: 1       ASSESSMENT AND PLAN: 1. Persistent Atrial Fibrillation/typical atrial flutter The patient's CHA2DS2-VASc score is 4, indicating a 4.8% annual risk of stroke.   Patient in atypical atrial flutter today. She appears paroxysmal based on her home heart rates and intermittent symptoms.  Will increase amiodarone to 200 mg daily temporarily. If she becomes persistent, can consider DCCV after 07/11/21.  Continue Eliquis 2.5 mg BID (age, weight) Continue Lopressor 12.5 mg BID  2. Secondary Hypercoagulable State (ICD10:  D68.69) The patient is at significant risk for  stroke/thromboembolism based upon her CHA2DS2-VASc Score of 4.  Continue Apixaban (Eliquis).   3. Severe AS S/p valve in valve TAVR 06/19/21  4. Chronic diastolic CHF Appears euvolemic post valve surgery.    Follow up in the AF clinic in one week.    [facility] [address] [place], [place] [address] [phone] 07/05/2021 4:45 PM

## 2021-07-05 NOTE — Patient Instructions (Signed)
Increase Amiodarone to 200mg  once a day

## 2021-07-09 DIAGNOSIS — B354 Tinea corporis: Secondary | ICD-10-CM | POA: Diagnosis not present

## 2021-07-11 ENCOUNTER — Other Ambulatory Visit: Payer: Self-pay

## 2021-07-11 ENCOUNTER — Ambulatory Visit (HOSPITAL_COMMUNITY)
Admission: RE | Admit: 2021-07-11 | Discharge: 2021-07-11 | Disposition: A | Discharge status: Home / Self Care | Payer: Medicare Other | Source: Ambulatory Visit | Attending: Physician Assistant | Admitting: Physician Assistant

## 2021-07-11 DIAGNOSIS — J810 Acute pulmonary edema: Secondary | ICD-10-CM | POA: Diagnosis not present

## 2021-07-11 DIAGNOSIS — I484 Atypical atrial flutter: Secondary | ICD-10-CM

## 2021-07-11 DIAGNOSIS — I4892 Unspecified atrial flutter: Secondary | ICD-10-CM | POA: Insufficient documentation

## 2021-07-11 DIAGNOSIS — Z79899 Other long term (current) drug therapy: Secondary | ICD-10-CM | POA: Diagnosis not present

## 2021-07-11 MED ORDER — AMIODARONE HCL 200 MG PO TABS: 200.0000 mg | ORAL_TABLET | Freq: Two times a day (BID) | ORAL | 2 refills | Status: DC

## 2021-07-11 NOTE — Progress Notes (Signed)
Patient returns for ECG after increasing amiodarone. She remains in rate controlled atrial flutter with variable block HR 74, QRS 112, QTc 470. She is apprehensive about having DCCV given the flash pulmonary edema she had previously. Will increase amiodarone to 200 mg BID. If she does not chemically convert, she is agreeable to DCCV. F/u in AF clinic 1-2 weeks.

## 2021-07-11 NOTE — Patient Instructions (Signed)
Increase amiodarone to 200mg  twice a day with food

## 2021-07-12 ENCOUNTER — Encounter (HOSPITAL_COMMUNITY): Payer: Medicare Other | Admitting: Physician Assistant

## 2021-07-20 ENCOUNTER — Encounter (HOSPITAL_COMMUNITY): Payer: Self-pay | Admitting: Physician Assistant

## 2021-07-20 ENCOUNTER — Other Ambulatory Visit: Payer: Self-pay

## 2021-07-20 ENCOUNTER — Ambulatory Visit (HOSPITAL_COMMUNITY)
Admission: RE | Admit: 2021-07-20 | Discharge: 2021-07-20 | Disposition: A | Discharge status: Home / Self Care | Payer: Medicare Other | Source: Ambulatory Visit | Attending: Physician Assistant | Admitting: Physician Assistant

## 2021-07-20 VITALS — BP 116/70 | HR 60 | Ht 61.0 in | Wt 109.0 lb

## 2021-07-20 DIAGNOSIS — I4819 Other persistent atrial fibrillation: Secondary | ICD-10-CM | POA: Insufficient documentation

## 2021-07-20 DIAGNOSIS — Z952 Presence of prosthetic heart valve: Secondary | ICD-10-CM | POA: Diagnosis not present

## 2021-07-20 DIAGNOSIS — I455 Other specified heart block: Secondary | ICD-10-CM | POA: Diagnosis not present

## 2021-07-20 DIAGNOSIS — I4892 Unspecified atrial flutter: Secondary | ICD-10-CM | POA: Insufficient documentation

## 2021-07-20 DIAGNOSIS — I350 Nonrheumatic aortic (valve) stenosis: Secondary | ICD-10-CM | POA: Insufficient documentation

## 2021-07-20 DIAGNOSIS — Z79899 Other long term (current) drug therapy: Secondary | ICD-10-CM | POA: Diagnosis not present

## 2021-07-20 DIAGNOSIS — I484 Atypical atrial flutter: Secondary | ICD-10-CM

## 2021-07-20 DIAGNOSIS — D6869 Other thrombophilia: Secondary | ICD-10-CM | POA: Insufficient documentation

## 2021-07-20 DIAGNOSIS — Z7901 Long term (current) use of anticoagulants: Secondary | ICD-10-CM | POA: Diagnosis not present

## 2021-07-20 NOTE — Patient Instructions (Signed)
Continue amiodarone at 200mg  twice a day until January 5th then reduce to 200mg  once a day   Cardioversion scheduled for Thursday, January 5th  -come to afib clinic for labs at [place] at the [employer] and go to admitting at [place] not eat or drink anything after midnight the night prior to your procedure.  - Take all your morning medication (except diabetic medications) with a sip of water prior to arrival.  - You will not be able to drive home after your procedure.  - Do NOT miss any doses of your blood thinner - if you should miss a dose please notify our office immediately.  - If you feel as if you go back into normal rhythm prior to scheduled cardioversion, please notify our office immediately. If your procedure is canceled in the cardioversion suite you will be charged a cancellation fee.  Patients will be asked to: to mask in public and hand hygiene (no longer quarantine) in the 3 days prior to surgery, to report if any COVID-19-like illness or household contacts to COVID-19 to determine need for testing

## 2021-07-20 NOTE — Progress Notes (Signed)
Primary Care Physician: [name], MD Primary Cardiologist: Dr [name] Primary Electrophysiologist: none Referring Physician: [facility] triage/Dr Viviana Simpler Doughman is a 80 y.o. female with a history of thoracic aortic aneurysm and AS s/p repair 2004, paroxysmal atrial fibrillation, atrial flutter who presents for follow up in the [facility]. The patient was initially diagnosed with atrial fibrillation in 2004 in the setting of her valve surgery. She had done well since that time with no known recurrence of afib. Patient has a CHADS2VASC score of 3. Patient was in her usual state of health until 05/14/20 when she began having heart "fluttering." These symptoms felt similar to her palpitations after her surgery. ECG 05/15/20 shows afib with RVR. There were no triggers that she could identify. She was started on Eliquis for stroke prevention. Patient is s/p DCCV on 06/05/20.   Patient seen by Dr [name] and symptoms of fluid retention and SOB were felt to be related to her valve disease. She underwent "valve in valve" TAVR on 06/19/21. She states that since the surgery she feels much better with resolution of her SOB.   On follow up today, patient remains in atrial flutter with palpitations. She feels persistent now with constant symptoms. She denies any missed doses of anticoagulation.   Today, she denies symptoms of chest pain, orthopnea, PND, lower extremity edema, dizziness, presyncope, syncope, snoring, daytime somnolence, bleeding, or neurologic sequela. The patient is tolerating medications without difficulties and is otherwise without complaint today.    Atrial Fibrillation Risk Factors:  she does not have symptoms or diagnosis of sleep apnea. she does not have a history of rheumatic fever. she does not have a history of alcohol use.   she has a BMI of Body mass index is 20.6 kg/m.[name] Filed Weights   07/20/21 0925  Weight: 49.4 kg     Family  History  Problem Relation Age of Onset   Heart attack Father      Atrial Fibrillation Management history:  Previous antiarrhythmic drugs: amiodarone Previous cardioversions: 2013, 06/05/20 Previous ablations: none CHADS2VASC score: 4 Anticoagulation history: Eliquis   Past Medical History:  Diagnosis Date   Anemia    Aortic root dilatation (HCC) 05/11/2012   CTA CHEST   Arthritis    Cardiac asystole, post DCCV requiring use of temp. external pacemaker leads 07/19/2012   Chronic kidney disease    Stage 4   GERD (gastroesophageal reflux disease)    H/O aortic valve disorder 10/04/2002   Dr. 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 McAlhany & Bartle   Thoracic ascending aortic aneurysm    CTA CHEST 05/11/12   Past Surgical History:  Procedure Laterality Date   ABDOMINAL HYSTERECTOMY     AORTIC VALVE REPLACEMENT  10/04/2002    Dr. 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 CATH LAB;  Service: Cardiovascular;  Laterality: N/A;   NM MYOCAR PERF WALL MOTION  10/29/2010   normal   REPLACEMENT ASCENDING AORTA  07/07/2012   Procedure: REPLACEMENT ASCENDING AORTA;  Surgeon: Ivin Poot, MD;  Location: [place];  Service: Open Heart Surgery;  Laterality: N/A;  CIRCULATORY ARREST   RIGHT HEART CATH AND CORONARY ANGIOGRAPHY N/A 04/19/2021   Procedure: RIGHT HEART CATH AND CORONARY ANGIOGRAPHY;  Surgeon: Burnell Blanks, MD;  Location: [place] CV LAB;  Service: Cardiovascular;  Laterality: N/A;   TEE WITHOUT CARDIOVERSION  06/18/2012   Procedure: TRANSESOPHAGEAL ECHOCARDIOGRAM (TEE);  Surgeon: Sanda Klein, MD;  Location[facility] ENDOSCOPY;  Service: Cardiovascular;  Laterality: N/A;  right and left heart cath after this procedure   TEE WITHOUT CARDIOVERSION  07/15/2012   Procedure: TRANSESOPHAGEAL ECHOCARDIOGRAM (TEE);  Surgeon: Pixie Casino, MD;  Location: [facility] ENDOSCOPY;  Service: Cardiovascular;  Laterality: N/A;   TEE WITHOUT CARDIOVERSION N/A 03/27/2021   Procedure: TRANSESOPHAGEAL ECHOCARDIOGRAM (TEE);  Surgeon: Sanda Klein, MD;  Location: [place];  Service: Cardiovascular;  Laterality: N/A;   TONSILLECTOMY     TRANSCATHETER AORTIC VALVE REPLACEMENT, TRANSFEMORAL N/A 06/19/2021   Procedure: TRANSCATHETER AORTIC VALVE REPLACEMENT, TRANSFEMORAL USING A 23MM EDWARDS VALVE;  Surgeon: Burnell Blanks, MD;  Location: [place];  Service: Open Heart Surgery;  Laterality: N/A;    Current Outpatient Medications  Medication Sig Dispense Refill   albuterol (VENTOLIN HFA) 108 (90 Base) MCG/ACT inhaler Inhale 2 puffs into the lungs every 6 (six) hours as needed for wheezing or shortness of breath.     amiodarone (PACERONE) 200 MG tablet Take 1 tablet (200 mg total) by mouth 2 (two) times daily. 90 tablet 2   amoxicillin (AMOXIL) 500 MG tablet Take 500 mg by mouth. Pt  will need Amoxicillin 2 grams prior to dental procedures     apixaban (ELIQUIS) 2.5 MG TABS tablet Take 1 tablet (2.5 mg total) by mouth 2 (two) times daily. 60 tablet 11   azelastine (ASTELIN) 0.1 % nasal spray Place 2 sprays into both nostrils daily as needed for allergies.     b complex vitamins capsule Take 1 capsule by mouth daily.     Calcium Carbonate-Vit D-Min (CALTRATE MINIS PLUS MINERALS PO) Take 40 mcg by mouth every morning.     cholecalciferol (VITAMIN D) 25 MCG (1000 UNIT) tablet Take 1,000 Units by mouth every evening.     FARXIGA 10 MG TABS tablet Take 10 mg by mouth daily.     Fe Fum-FA-B Cmp-C-Zn-Mg-Mn-Cu (HEMOCYTE PLUS) 106-1 MG CAPS Take 1 capsule by mouth daily.     ipratropium (ATROVENT) 0.06 % nasal spray 2 sprays in each nostril every 6 hours if needed 15 mL 5   ipratropium-albuterol (DUONEB) 0.5-2.5 (3) MG/3ML SOLN Inhale 3 mLs into the lungs every 6 (six) hours as needed (shortness of breath or wheezing).     metoprolol tartrate (LOPRESSOR) 25 MG tablet Take 0.5 tablets (12.5 mg total) by mouth 2 (two) times daily. 180 tablet 3  multivitamin-lutein (OCUVITE-LUTEIN) CAPS capsule Take 1 capsule by mouth daily.     pantoprazole (PROTONIX) 40 MG tablet TAKE ONE TABLET BY MOUTH ONCE DAILY BEFORE meal of choice 90 tablet 2   Polyethyl Glycol-Propyl Glycol (SYSTANE) 0.4-0.3 % SOLN Place 1 drop into both eyes 2 (two) times daily as needed (dry eyes).     pravastatin (PRAVACHOL) 10 MG tablet Take 10 mg by mouth every evening.     SUMAtriptan (IMITREX) 100 MG tablet Take 100 mg by mouth every 2 (two) hours as needed for migraine. May repeat in 2 hours if headache persists or recurs.     torsemide (DEMADEX) 10 MG tablet Take 2 tablets (20 mg total) by mouth every morning. 90 tablet 1   No current facility-administered medications for this encounter.    Allergies  Allergen Reactions   Other Nausea And Vomiting    Headache also  Also from artificial sweeetners   Codeine Nausea  And Vomiting    Social History   Socioeconomic History   Marital status: Widowed    Spouse name: Not on file   Number of children: 1   Years of education: Not on file   Highest education level: Not on file  Occupational History   Occupation: Retired-realtor  Tobacco Use   Smoking status: Never   Smokeless tobacco: Never  Vaping Use   Vaping Use: Never used  Substance and Sexual Activity   Alcohol use: No   Drug use: No   Sexual activity: Not Currently  Other Topics Concern   Not on file  Social History Narrative   Not on file   Social Determinants of Health   Financial Resource Strain: Not on file  Food Insecurity: Not on file  Transportation Needs: Not on file  Physical Activity: Not on file  Stress: Not on file  Social Connections: Not on file  Intimate Partner Violence: Not on file     ROS- All systems are reviewed and negative except as per the HPI above.  Physical Exam: Vitals:   07/20/21 0925  BP: 116/70  Pulse: 60  Weight: 49.4 kg  Height: 5\' 1"  (1.549 m)   GEN- The patient is a well appearing elderly female, alert and oriented x 3 today.   HEENT-head normocephalic, atraumatic, sclera clear, conjunctiva pink, hearing intact, trachea midline. Lungs- Clear to ausculation bilaterally, normal work of breathing Heart- irregular rate and rhythm, no murmurs, rubs or gallops  GI- soft, NT, ND, + BS Extremities- no clubbing, cyanosis, or edema MS- no significant deformity or atrophy Skin- no rash or lesion Psych- euthymic mood, full affect Neuro- strength and sensation are intact   Wt Readings from Last 3 Encounters:  07/20/21 49.4 kg  07/05/21 48.4 kg  06/27/21 48.5 kg    EKG today demonstrates  Atrial flutter with variable block Vent. rate 60 BPM PR interval * ms QRS duration 106 ms QT/QTcB 446/446 ms  Echo 06/20/21 demonstrated   1. There is a 23 mm Edwards Sapien prosthetic (TAVR) valve present in the aortic position. Effective orifice  area, by VTI measures 1.29 cm. Aortic valve mean gradient measures 26 mmHg, peak gradient 36 mmHg. Aortic valve Vmax measures 3.12 m/s. DVI  0.41. No PVL.   2. Left ventricular ejection fraction, by estimation, is 60 to 65%. The  left ventricle has normal function. The left ventricle has no regional  wall motion abnormalities. There is moderate concentric left ventricular hypertrophy. Left ventricular diastolic parameters are consistent with Grade II diastolic dysfunction  (  pseudonormalization).   3. Right ventricular systolic function is normal. The right ventricular  size is normal. There is normal pulmonary artery systolic pressure. The estimated right ventricular systolic pressure is 06.0 mmHg.   4. Left atrial size was severely dilated.   5. Right atrial size was mildly dilated.   6. The mitral valve is normal in structure. Trivial mitral valve  regurgitation. No evidence of mitral stenosis.   Comparison(s): A prior study was performed on 06/19/21. PVL has resolved. DVI is similar to pre-valve in valve procedure, but without central aortic regurgitation.   Epic records are reviewed at length today  CHA2DS2-VASc Score = 4  The patient's score is based upon: CHF History: 1 HTN History: 0 Diabetes History: 0 Stroke History: 0 Vascular Disease History: 0 Age Score: 2 Gender Score: 1       ASSESSMENT AND PLAN: 1. Persistent Atrial Fibrillation/typical atrial flutter The patient's CHA2DS2-VASc score is 4, indicating a 4.8% annual risk of stroke.   Patient persistently in atrial flutter despite increasing amiodarone.  Will plan for DCCV.  Continue amiodarone 200 mg BID for now. Will decrease to once daily after DCCV. Recent cmet/TSH reviewed.  Continue Eliquis 2.5 mg BID (age, weight) Continue Lopressor 12.5 mg BID  2. Secondary Hypercoagulable State (ICD10:  D68.69) The patient is at significant risk for stroke/thromboembolism based upon her CHA2DS2-VASc Score of 4.  Continue  Apixaban (Eliquis).   3. Severe AS S/p valve in valve TAVR 06/19/21 Echo scheduled for 07/25/21  4. Chronic diastolic CHF No signs or symptoms of fluid overload today.    Follow up in the AF clinic post DCCV.    [facility] [address] [place], [place] [address] [phone] 07/20/2021 9:49 AM

## 2021-07-24 NOTE — H&P (View-Only) (Signed)
[facility] [place]                                     Cardiology Office Note:    Date:  07/25/2021   ID:  [name] [name], DOB [birth year], MRN [id number]  PCP:  [name], MD  [facility] Cardiologist:  Quay Burow, MD / Dr. Angelena Form & Dr. Cyndia Bent (TAVR)  [place] Electrophysiologist:  None   Referring MD: [name], MD   1 month s/p TAVR  History of Present Illness:    [name] [name] is a 80 y.o. female with a hx of  CKD stage IIIb, GERD, HLD, PAF on amio & Eliquis, thoracic aortic aneurysm, CKD stage IIIb, aortic valve disease s/p bioprosthetic AVR (21 mm Edwards Perimount T7322 bioprosthetic valve-Dr. 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    She is status post biological Bentall procedure in 2004 by Dr. Prescott Gum. She has a 21 mm Edwards Perimount 7200 bioprosthetic valve.  She had an echocardiogram on 03/06/2021 that showed the bioprosthetic aortic valve to be heavily calcified. The mean gradient was 28 mmHg with at least moderate eccentric aortic insufficiency. Aortic root was noted to be 40 mm. She underwent TEE on 03/27/2021 for further evaluation of the valve which showed moderate to severe insufficiency with no paravalvular leak.  The mean gradient was 13.4 mmHg with an AI pressure half-time of 336 ms.  Left ventricular ejection fraction was 60 to 65%.  There is grade 2 diastolic dysfunction.  There was mild to moderate mitral regurgitation. [facility] 04/19/21 showed no CAD. Hemodynamic findings consistent with moderate pulmonary hypertension and elevated right heart pressures. Her torsemide was increased to 20 mg daily at that time.    She was evaluated by the multidisciplinary valve team and underwent valve in valve TAVR 06/19/21 with a 23 mm Edwards Sapien 3 Ultra Resilia THV via the TF approach. Post operative  echo showed EF 60-65%, stable valve placement with mean gradient at 46mmHg, peak at 62mmHg, AVA by VTI 0.41 and no PVL. She was resumed on home Eliquis at prior dose.   At post hospital follow up she was a bit dizzy with soft Bps and BB was reduced. She was then seen in the afib clinic by [facility] and back in atrial flutter despite increasing Amio. She has been set up for DCCV on 08/09/21  Today the patient presents to clinic for follow up. She is feeling much better since TAVR and reducing her beta blocker. Can usually tell when heart is out of rhythm but today it feels more steady. No CP or SOB. No LE edema, orthopnea or PND. No dizziness or syncope. No blood in stool or urine.     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 external pacemaker leads 07/19/2012   Chronic kidney disease    Stage 4   GERD (gastroesophageal reflux disease)    H/O aortic valve disorder 10/04/2002   Dr. Tharon Aquas Trigt   History of migraine headaches    Hyperlipidemia    on statin therapy   Migraines    PAF (paroxysmal atrial fibrillation) ([place])  Prosthetic valve dysfunction    S/P valve in valve TAVR (transcatheter aortic valve replacement) 06/19/2021   s/p valve in valve TAVR with a 45mm Edwards S3UR via the TF approach via the TF approach by Drs. McAlhany & Bartle   Thoracic ascending aortic aneurysm    CTA CHEST 05/11/12    Past Surgical History:  Procedure Laterality Date   ABDOMINAL HYSTERECTOMY     AORTIC VALVE REPLACEMENT  10/04/2002    Dr. 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   INTRAOPERATIVE TRANSTHORACIC ECHOCARDIOGRAM N/A 06/19/2021   Procedure: INTRAOPERATIVE TRANSTHORACIC ECHOCARDIOGRAM;  Surgeon: Burnell Blanks, MD;  Location: [place];  Service: Open Heart Surgery;  Laterality: N/A;   LEFT AND RIGHT HEART CATHETERIZATION WITH CORONARY ANGIOGRAM N/A 06/18/2012   Procedure: LEFT AND RIGHT HEART CATHETERIZATION WITH CORONARY ANGIOGRAM;  Surgeon: Lorretta Harp, MD;  Location: [facility] CATH LAB;  Service: Cardiovascular;  Laterality: N/A;   NM MYOCAR PERF WALL MOTION  10/29/2010   normal   REPLACEMENT ASCENDING AORTA  07/07/2012   Procedure: REPLACEMENT ASCENDING AORTA;  Surgeon: Ivin Poot, MD;  Location: [place];  Service: Open Heart Surgery;  Laterality: N/A;  CIRCULATORY ARREST   RIGHT HEART CATH AND CORONARY ANGIOGRAPHY N/A 04/19/2021   Procedure: RIGHT HEART CATH AND CORONARY ANGIOGRAPHY;  Surgeon: Burnell Blanks, MD;  Location: [place] CV LAB;  Service: Cardiovascular;  Laterality: N/A;   TEE WITHOUT CARDIOVERSION  06/18/2012   Procedure: TRANSESOPHAGEAL ECHOCARDIOGRAM (TEE);  Surgeon: Sanda Klein, MD;  Location[facility] ENDOSCOPY;  Service: Cardiovascular;  Laterality: N/A;  right and left heart cath after this procedure   TEE WITHOUT CARDIOVERSION  07/15/2012   Procedure: TRANSESOPHAGEAL ECHOCARDIOGRAM (TEE);  Surgeon: Pixie Casino, MD;  Location: [facility] ENDOSCOPY;  Service: Cardiovascular;  Laterality: N/A;   TEE WITHOUT CARDIOVERSION N/A 03/27/2021   Procedure: TRANSESOPHAGEAL ECHOCARDIOGRAM (TEE);  Surgeon: Sanda Klein, MD;  Location: [place];  Service: Cardiovascular;  Laterality: N/A;   TONSILLECTOMY     TRANSCATHETER AORTIC VALVE REPLACEMENT, TRANSFEMORAL N/A 06/19/2021   Procedure: TRANSCATHETER AORTIC VALVE REPLACEMENT, TRANSFEMORAL USING A 23MM EDWARDS VALVE;  Surgeon: Burnell Blanks, MD;  Location: [place];  Service: Open Heart  Surgery;  Laterality: N/A;    Current Medications: Current Meds  Medication Sig   albuterol (VENTOLIN HFA) 108 (90 Base) MCG/ACT inhaler Inhale 2 puffs into the lungs every 6 (six) hours as needed for wheezing or shortness of breath.   amiodarone (PACERONE) 200 MG tablet Take 1 tablet (200 mg total) by mouth 2 (two) times daily.   amoxicillin (AMOXIL) 500 MG tablet Take 500 mg by mouth. Pt will need Amoxicillin 2 grams prior to dental procedures   apixaban (ELIQUIS) 2.5 MG TABS tablet Take 1 tablet (2.5 mg total) by mouth 2 (two) times daily.   azelastine (ASTELIN) 0.1 % nasal spray Place 2 sprays into both nostrils daily as needed for allergies.   b complex vitamins capsule Take 1 capsule by mouth daily.   Calcium Carbonate-Vit D-Min (CALTRATE MINIS PLUS MINERALS PO) Take 40 mcg by mouth every morning.   cholecalciferol (VITAMIN  D) 25 MCG (1000 UNIT) tablet Take 1,000 Units by mouth every evening.   FARXIGA 10 MG TABS tablet Take 10 mg by mouth daily.   Fe Fum-FA-B Cmp-C-Zn-Mg-Mn-Cu (HEMOCYTE PLUS) 106-1 MG CAPS Take 1 capsule by mouth daily.   ipratropium (ATROVENT) 0.06 % nasal spray 2 sprays in each nostril every 6 hours if needed   ipratropium-albuterol (DUONEB) 0.5-2.5 (3) MG/3ML SOLN Inhale 3 mLs into the lungs every 6 (six) hours as needed (shortness of breath or wheezing).   metoprolol tartrate (LOPRESSOR) 25 MG tablet Take 0.5 tablets (12.5 mg total) by mouth 2 (two) times daily.   multivitamin-lutein (OCUVITE-LUTEIN) CAPS capsule Take 1 capsule by mouth daily.   pantoprazole (PROTONIX) 40 MG tablet TAKE ONE TABLET BY MOUTH ONCE DAILY BEFORE meal of choice   Polyethyl Glycol-Propyl Glycol (SYSTANE) 0.4-0.3 % SOLN Place 1 drop into both eyes 2 (two) times daily as needed (dry eyes).   pravastatin (PRAVACHOL) 10 MG tablet Take 10 mg by mouth every evening.   SUMAtriptan (IMITREX) 100 MG tablet Take 100 mg by mouth every 2 (two) hours as needed for migraine. May repeat in 2 hours if  headache persists or recurs.   torsemide (DEMADEX) 10 MG tablet Take 2 tablets (20 mg total) by mouth every morning.     Allergies:   Other and Codeine   Social History   Socioeconomic History   Marital status: Widowed    Spouse name: Not on file   Number of children: 1   Years of education: Not on file   Highest education level: Not on file  Occupational History   Occupation: Retired-realtor  Tobacco Use   Smoking status: Never   Smokeless tobacco: Never  Vaping Use   Vaping Use: Never used  Substance and Sexual Activity   Alcohol use: No   Drug use: No   Sexual activity: Not Currently  Other Topics Concern   Not on file  Social History Narrative   Not on file   Social Determinants of Health   Financial Resource Strain: Not on file  Food Insecurity: Not on file  Transportation Needs: Not on file  Physical Activity: Not on file  Stress: Not on file  Social Connections: Not on file     Family History: The patient's family history includes Heart attack in her father.  ROS:   Please see the history of present illness.    All other systems reviewed and are negative.  EKGs/Labs/Other Studies Reviewed:    The following studies were reviewed today:   TAVR OPERATIVE NOTE     Date of Procedure:                06/19/2021   Preoperative Diagnosis:      Severe Aortic Stenosis    Postoperative Diagnosis:    Same    Procedure:        Transcatheter Aortic Valve in Valve Replacement - Percutaneous Left Transfemoral Approach             Edwards Sapien 3 Ultra ResiliaTHV (size 23 mm, model # 9755RSL, serial # [id number])              Co-Surgeons:                        Gaye Pollack, MD and /  Lauree Chandler, MD     Anesthesiologist:  T. Fransisco Beau, MD   Echocardiographer:              Osborne Oman, MD   Pre-operative Echo Findings: Severe aortic stenosis Normal left ventricular systolic function   Post-operative Echo Findings: Trivial  paravalvular leak Normal left ventricular systolic function    ________________________  Echocardiogram 06/20/21:  1. There is a 23 mm Edwards Sapien prosthetic (TAVR) valve present in the  aortic position. Effective orifice area, by VTI measures 1.29 cm. Aortic  valve mean gradient measures 26 mmHg, peak gradient 36 mmHg. Aortic valve  Vmax measures 3.12 m/s. DVI  0.41. No PVL.   2. Left ventricular ejection fraction, by estimation, is 60 to 65%. The  left ventricle has normal function. The left ventricle has no regional  wall motion abnormalities. There is moderate concentric left ventricular  hypertrophy. Left ventricular  diastolic parameters are consistent with Grade II diastolic dysfunction  (pseudonormalization).   3. Right ventricular systolic function is normal. The right ventricular  size is normal. There is normal pulmonary artery systolic pressure. The  estimated right ventricular systolic pressure is 85.4 mmHg.   4. Left atrial size was severely dilated.   5. Right atrial size was mildly dilated.   6. The mitral valve is normal in structure. Trivial mitral valve  regurgitation. No evidence of mitral stenosis.   _________________________  Echo 07/25/21 IMPRESSIONS  1. Left ventricular ejection fraction, by estimation, is 55 to 60%. The left ventricle has normal function. The left ventricle has no regional wall motion abnormalities. There is moderate concentric left ventricular hypertrophy. Left ventricular  diastolic parameters are indeterminate.  2. Right ventricular systolic function is low normal. The right ventricular size is normal. There is normal pulmonary artery systolic pressure.  3. Left atrial size was moderately dilated.  4. Right atrial size was mildly dilated.  5. The mitral valve is normal in structure. Trivial mitral valve regurgitation. No evidence of mitral stenosis.  6. The aortic valve has been repaired/replaced. Aortic valve regurgitation is not  visualized. There is a 23 mm Sapien prosthetic (TAVR) valve present in the aortic position. Procedure Date: 06/19/2021. Echo findings are consistent with normal structure  and function of the aortic valve prosthesis. Aortic valve area, by VTI measures 1.33 cm. Aortic valve mean gradient measures 11.0 mmHg. Aortic valve Vmax measures 2.15 m/s.  7. The inferior vena cava is normal in size with greater than 50% respiratory variability, suggesting right atrial pressure of 3 mmHg.   Comparison(s): No significant change from prior study. Prior images reviewed side by side.   Conclusion(s)/Recommendation(s): S/P valve in valve TAVR. Gradients improved from prior study.   EKG:  EKG is NOT ordered today.   Recent Labs: 06/15/2021: B Natriuretic Peptide 2,830.7 06/20/2021: Magnesium 2.2 07/05/2021: ALT 16; BUN 37; Creatinine, Ser 2.32; Hemoglobin 12.0; Platelets 200; Potassium 3.6; Sodium 139; TSH 1.986  Recent Lipid Panel No results found for: CHOL, TRIG, HDL, CHOLHDL, VLDL, LDLCALC, LDLDIRECT   Physical Exam:    VS:  BP 100/76    Pulse 85    Ht 5\' 1"  (1.549 m)    Wt 107 lb (48.5 kg)    SpO2 97%    BMI 20.22 kg/m     Wt Readings from Last 3 Encounters:  07/25/21 107 lb (48.5 kg)  07/20/21 109 lb (49.4 kg)  07/05/21 106 lb 9.6 oz (48.4 kg)    General: Petite, NAD Neck: No JVD Lungs:Clear to ausculation bilaterally. Breathing is unlabored. Cardiovascular: irreg irreg, with S1  S2. + soft murmur Extremities: No edema.  Neuro: Alert and oriented. No focal deficits. No facial asymmetry. MAE spontaneously. Psych: Responds to questions appropriately with normal affect.    ASSESSMENT/PLAN:    Severe AS s/p valve in valve TAVR: echo today shows EF 65%, normally functioning TAVR with a mean gradient of 12 mm hg and no PVL. She has NYHA class I symptoms. SBE prophylaxis discussed; she has amoxicillin. I will see her back in 1 year with an echo.   Chronic diastolic CHF: she appears euvolemic off  diuretics.     PAFib/flutter: planned for DCCV on 08/09/21. Continue on Eliquis.   HTN: Bp well controlled today. No changes made   CKD stage IIIb: Creat 1.89 on hospital discharge which appears to be at her baseline. 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 Will get follow up in 1 year.    Medication Adjustments/Labs and Tests Ordered: Current medicines are reviewed at length with the patient today.  Concerns regarding medicines are outlined above.  No orders of the defined types were placed in this encounter.   No orders of the defined types were placed in this encounter.    Patient Instructions  Medication Instructions:  Your physician recommends that you continue on your current medications as directed. Please refer to the Current Medication list given to you today.  *If you need a refill on your cardiac medications before your next appointment, please call your pharmacy*   Lab Work: None ordered   If you have labs (blood work) drawn today and your tests are completely normal, you will receive your results only by: [place] (if you have MyChart) OR A paper copy in the mail If you have any lab test that is abnormal or we need to change your treatment, we will call you to review the results.   Testing/Procedures: None ordered    Follow-Up: Follow up as scheduled    Other Instructions None     Signed, Angelena Form, PA-C  07/25/2021 8:32 PM    [facility]

## 2021-07-24 NOTE — Progress Notes (Signed)
HE[facility] [place]                                     Cardiology Office Note:    Date:  07/25/2021   ID:  [name], DOB [birth year], MRN [id number]  PCP:  [name], MD  [facility] Cardiologist:  Quay Burow, MD / Dr. Angelena Form & Dr. Cyndia Bent (TAVR)  [place] Electrophysiologist:  None   Referring MD: [name], MD   1 month s/p TAVR  History of Present Illness:    [name] [name] is a 80 y.o. female with a hx of  CKD stage IIIb, GERD, HLD, PAF on amio & Eliquis, thoracic aortic aneurysm, CKD stage IIIb, aortic valve disease s/p bioprosthetic AVR (21 mm Edwards Perimount S3419 bioprosthetic valve-Dr. 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    She is status post biological Bentall procedure in 2004 by Dr. Prescott Gum. She has a 21 mm Edwards Perimount 7200 bioprosthetic valve.  She had an echocardiogram on 03/06/2021 that showed the bioprosthetic aortic valve to be heavily calcified. The mean gradient was 28 mmHg with at least moderate eccentric aortic insufficiency. Aortic root was noted to be 40 mm. She underwent TEE on 03/27/2021 for further evaluation of the valve which showed moderate to severe insufficiency with no paravalvular leak.  The mean gradient was 13.4 mmHg with an AI pressure half-time of 336 ms.  Left ventricular ejection fraction was 60 to 65%.  There is grade 2 diastolic dysfunction.  There was mild to moderate mitral regurgitation. [facility] 04/19/21 showed no CAD. Hemodynamic findings consistent with moderate pulmonary hypertension and elevated right heart pressures. Her torsemide was increased to 20 mg daily at that time.    She was evaluated by the multidisciplinary valve team and underwent valve in valve TAVR 06/19/21 with a 23 mm Edwards Sapien 3 Ultra Resilia THV via the TF approach. Post operative  echo showed EF 60-65%, stable valve placement with mean gradient at 38mmHg, peak at 75mmHg, AVA by VTI 0.41 and no PVL. She was resumed on home Eliquis at prior dose.   At post hospital follow up she was a bit dizzy with soft Bps and BB was reduced. She was then seen in the afib clinic by [facility] and back in atrial flutter despite increasing Amio. She has been set up for DCCV on 08/09/21  Today the patient presents to clinic for follow up. She is feeling much better since TAVR and reducing her beta blocker. Can usually tell when heart is out of rhythm but today it feels more steady. No CP or SOB. No LE edema, orthopnea or PND. No dizziness or syncope. No blood in stool or urine.     Past Medical History:  Diagnosis Date   Anemia    Aortic root dilatation ([facility]) 05/11/2012   CTA CHEST   Arthritis    Cardiac asystole, post DCCV requiring use of temp. external pacemaker leads 07/19/2012   Chronic kidney disease    Stage 4   GERD (gastroesophageal reflux disease)    H/O aortic valve disorder 10/04/2002   Dr. Tharon Aquas Trigt   History of migraine headaches    Hyperlipidemia    on statin therapy   Migraines    PAF (paroxysmal atrial fibrillation) ([place])  Prosthetic valve dysfunction    S/P valve in valve TAVR (transcatheter aortic valve replacement) 06/19/2021   s/p valve in valve TAVR with a 54mm Edwards S3UR via the TF approach via the TF approach by Drs. McAlhany & Bartle   Thoracic ascending aortic aneurysm    CTA CHEST 05/11/12    Past Surgical History:  Procedure Laterality Date   ABDOMINAL HYSTERECTOMY     AORTIC VALVE REPLACEMENT  10/04/2002    Dr. 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   INTRAOPERATIVE TRANSTHORACIC ECHOCARDIOGRAM N/A 06/19/2021   Procedure: INTRAOPERATIVE TRANSTHORACIC ECHOCARDIOGRAM;  Surgeon: Burnell Blanks, MD;  Location: [place];  Service: Open Heart Surgery;  Laterality: N/A;   LEFT AND RIGHT HEART CATHETERIZATION WITH CORONARY ANGIOGRAM N/A 06/18/2012   Procedure: LEFT AND RIGHT HEART CATHETERIZATION WITH CORONARY ANGIOGRAM;  Surgeon: Lorretta Harp, MD;  Location: [facility] CATH LAB;  Service: Cardiovascular;  Laterality: N/A;   NM MYOCAR PERF WALL MOTION  10/29/2010   normal   REPLACEMENT ASCENDING AORTA  07/07/2012   Procedure: REPLACEMENT ASCENDING AORTA;  Surgeon: Ivin Poot, MD;  Location: [place];  Service: Open Heart Surgery;  Laterality: N/A;  CIRCULATORY ARREST   RIGHT HEART CATH AND CORONARY ANGIOGRAPHY N/A 04/19/2021   Procedure: RIGHT HEART CATH AND CORONARY ANGIOGRAPHY;  Surgeon: Burnell Blanks, MD;  Location: [place] CV LAB;  Service: Cardiovascular;  Laterality: N/A;   TEE WITHOUT CARDIOVERSION  06/18/2012   Procedure: TRANSESOPHAGEAL ECHOCARDIOGRAM (TEE);  Surgeon: Sanda Klein, MD;  Location: [facility] ENDOSCOPY;  Service: Cardiovascular;  Laterality: N/A;  right and left heart cath after this procedure   TEE WITHOUT CARDIOVERSION  07/15/2012   Procedure: TRANSESOPHAGEAL ECHOCARDIOGRAM (TEE);  Surgeon: Pixie Casino, MD;  Location: [facility] ENDOSCOPY;  Service: Cardiovascular;  Laterality: N/A;   TEE WITHOUT CARDIOVERSION N/A 03/27/2021   Procedure: TRANSESOPHAGEAL ECHOCARDIOGRAM (TEE);  Surgeon: Sanda Klein, MD;  Location: [place];  Service: Cardiovascular;  Laterality: N/A;   TONSILLECTOMY     TRANSCATHETER AORTIC VALVE REPLACEMENT, TRANSFEMORAL N/A 06/19/2021   Procedure: TRANSCATHETER AORTIC VALVE REPLACEMENT, TRANSFEMORAL USING A 23MM EDWARDS VALVE;  Surgeon: Burnell Blanks, MD;  Location: [place];  Service: Open Heart  Surgery;  Laterality: N/A;    Current Medications: Current Meds  Medication Sig   albuterol (VENTOLIN HFA) 108 (90 Base) MCG/ACT inhaler Inhale 2 puffs into the lungs every 6 (six) hours as needed for wheezing or shortness of breath.   amiodarone (PACERONE) 200 MG tablet Take 1 tablet (200 mg total) by mouth 2 (two) times daily.   amoxicillin (AMOXIL) 500 MG tablet Take 500 mg by mouth. Pt will need Amoxicillin 2 grams prior to dental procedures   apixaban (ELIQUIS) 2.5 MG TABS tablet Take 1 tablet (2.5 mg total) by mouth 2 (two) times daily.   azelastine (ASTELIN) 0.1 % nasal spray Place 2 sprays into both nostrils daily as needed for allergies.   b complex vitamins capsule Take 1 capsule by mouth daily.   Calcium Carbonate-Vit D-Min (CALTRATE MINIS PLUS MINERALS PO) Take 40 mcg by mouth every morning.   cholecalciferol (VITAMIN  D) 25 MCG (1000 UNIT) tablet Take 1,000 Units by mouth every evening.   FARXIGA 10 MG TABS tablet Take 10 mg by mouth daily.   Fe Fum-FA-B Cmp-C-Zn-Mg-Mn-Cu (HEMOCYTE PLUS) 106-1 MG CAPS Take 1 capsule by mouth daily.   ipratropium (ATROVENT) 0.06 % nasal spray 2 sprays in each nostril every 6 hours if needed   ipratropium-albuterol (DUONEB) 0.5-2.5 (3) MG/3ML SOLN Inhale 3 mLs into the lungs every 6 (six) hours as needed (shortness of breath or wheezing).   metoprolol tartrate (LOPRESSOR) 25 MG tablet Take 0.5 tablets (12.5 mg total) by mouth 2 (two) times daily.   multivitamin-lutein (OCUVITE-LUTEIN) CAPS capsule Take 1 capsule by mouth daily.   pantoprazole (PROTONIX) 40 MG tablet TAKE ONE TABLET BY MOUTH ONCE DAILY BEFORE meal of choice   Polyethyl Glycol-Propyl Glycol (SYSTANE) 0.4-0.3 % SOLN Place 1 drop into both eyes 2 (two) times daily as needed (dry eyes).   pravastatin (PRAVACHOL) 10 MG tablet Take 10 mg by mouth every evening.   SUMAtriptan (IMITREX) 100 MG tablet Take 100 mg by mouth every 2 (two) hours as needed for migraine. May repeat in 2 hours if  headache persists or recurs.   torsemide (DEMADEX) 10 MG tablet Take 2 tablets (20 mg total) by mouth every morning.     Allergies:   Other and Codeine   Social History   Socioeconomic History   Marital status: Widowed    Spouse name: Not on file   Number of children: 1   Years of education: Not on file   Highest education level: Not on file  Occupational History   Occupation: Retired-realtor  Tobacco Use   Smoking status: Never   Smokeless tobacco: Never  Vaping Use   Vaping Use: Never used  Substance and Sexual Activity   Alcohol use: No   Drug use: No   Sexual activity: Not Currently  Other Topics Concern   Not on file  Social History Narrative   Not on file   Social Determinants of Health   Financial Resource Strain: Not on file  Food Insecurity: Not on file  Transportation Needs: Not on file  Physical Activity: Not on file  Stress: Not on file  Social Connections: Not on file     Family History: The patient's family history includes Heart attack in her father.  ROS:   Please see the history of present illness.    All other systems reviewed and are negative.  EKGs/Labs/Other Studies Reviewed:    The following studies were reviewed today:   TAVR OPERATIVE NOTE     Date of Procedure:                06/19/2021   Preoperative Diagnosis:      Severe Aortic Stenosis    Postoperative Diagnosis:    Same    Procedure:        Transcatheter Aortic Valve in Valve Replacement - Percutaneous Left Transfemoral Approach             Edwards Sapien 3 Ultra ResiliaTHV (size 23 mm, model # 9755RSL, serial # [id number])              Co-Surgeons:                        Gaye Pollack, MD and /  Lauree Chandler, MD     Anesthesiologist:  T. Fransisco Beau, MD   Echocardiographer:              Osborne Oman, MD   Pre-operative Echo Findings: Severe aortic stenosis Normal left ventricular systolic function   Post-operative Echo Findings: Trivial  paravalvular leak Normal left ventricular systolic function    ________________________  Echocardiogram 06/20/21:  1. There is a 23 mm Edwards Sapien prosthetic (TAVR) valve present in the  aortic position. Effective orifice area, by VTI measures 1.29 cm. Aortic  valve mean gradient measures 26 mmHg, peak gradient 36 mmHg. Aortic valve  Vmax measures 3.12 m/s. DVI  0.41. No PVL.   2. Left ventricular ejection fraction, by estimation, is 60 to 65%. The  left ventricle has normal function. The left ventricle has no regional  wall motion abnormalities. There is moderate concentric left ventricular  hypertrophy. Left ventricular  diastolic parameters are consistent with Grade II diastolic dysfunction  (pseudonormalization).   3. Right ventricular systolic function is normal. The right ventricular  size is normal. There is normal pulmonary artery systolic pressure. The  estimated right ventricular systolic pressure is 62.3 mmHg.   4. Left atrial size was severely dilated.   5. Right atrial size was mildly dilated.   6. The mitral valve is normal in structure. Trivial mitral valve  regurgitation. No evidence of mitral stenosis.   _________________________  Echo 07/25/21 IMPRESSIONS  1. Left ventricular ejection fraction, by estimation, is 55 to 60%. The left ventricle has normal function. The left ventricle has no regional wall motion abnormalities. There is moderate concentric left ventricular hypertrophy. Left ventricular  diastolic parameters are indeterminate.  2. Right ventricular systolic function is low normal. The right ventricular size is normal. There is normal pulmonary artery systolic pressure.  3. Left atrial size was moderately dilated.  4. Right atrial size was mildly dilated.  5. The mitral valve is normal in structure. Trivial mitral valve regurgitation. No evidence of mitral stenosis.  6. The aortic valve has been repaired/replaced. Aortic valve regurgitation is not  visualized. There is a 23 mm Sapien prosthetic (TAVR) valve present in the aortic position. Procedure Date: 06/19/2021. Echo findings are consistent with normal structure  and function of the aortic valve prosthesis. Aortic valve area, by VTI measures 1.33 cm. Aortic valve mean gradient measures 11.0 mmHg. Aortic valve Vmax measures 2.15 m/s.  7. The inferior vena cava is normal in size with greater than 50% respiratory variability, suggesting right atrial pressure of 3 mmHg.   Comparison(s): No significant change from prior study. Prior images reviewed side by side.   Conclusion(s)/Recommendation(s): S/P valve in valve TAVR. Gradients improved from prior study.   EKG:  EKG is NOT ordered today.   Recent Labs: 06/15/2021: B Natriuretic Peptide 2,830.7 06/20/2021: Magnesium 2.2 07/05/2021: ALT 16; BUN 37; Creatinine, Ser 2.32; Hemoglobin 12.0; Platelets 200; Potassium 3.6; Sodium 139; TSH 1.986  Recent Lipid Panel No results found for: CHOL, TRIG, HDL, CHOLHDL, VLDL, LDLCALC, LDLDIRECT   Physical Exam:    VS:  BP 100/76    Pulse 85    Ht 5\' 1"  (1.549 m)    Wt 107 lb (48.5 kg)    SpO2 97%    BMI 20.22 kg/m     Wt Readings from Last 3 Encounters:  07/25/21 107 lb (48.5 kg)  07/20/21 109 lb (49.4 kg)  07/05/21 106 lb 9.6 oz (48.4 kg)    General: Petite, NAD Neck: No JVD Lungs:Clear to ausculation bilaterally. Breathing is unlabored. Cardiovascular: irreg irreg, with S1  S2. + soft murmur Extremities: No edema.  Neuro: Alert and oriented. No focal deficits. No facial asymmetry. MAE spontaneously. Psych: Responds to questions appropriately with normal affect.    ASSESSMENT/PLAN:    Severe AS s/p valve in valve TAVR: echo today shows EF 65%, normally functioning TAVR with a mean gradient of 12 mm hg and no PVL. She has NYHA class I symptoms. SBE prophylaxis discussed; she has amoxicillin. I will see her back in 1 year with an echo.   Chronic diastolic CHF: she appears euvolemic off  diuretics.     PAFib/flutter: planned for DCCV on 08/09/21. Continue on Eliquis.   HTN: Bp well controlled today. No changes made   CKD stage IIIb: Creat 1.89 on hospital discharge which appears to be at her baseline. 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 Will get follow up in 1 year.    Medication Adjustments/Labs and Tests Ordered: Current medicines are reviewed at length with the patient today.  Concerns regarding medicines are outlined above.  No orders of the defined types were placed in this encounter.   No orders of the defined types were placed in this encounter.    Patient Instructions  Medication Instructions:  Your physician recommends that you continue on your current medications as directed. Please refer to the Current Medication list given to you today.  *If you need a refill on your cardiac medications before your next appointment, please call your pharmacy*   Lab Work: None ordered   If you have labs (blood work) drawn today and your tests are completely normal, you will receive your results only by: [place] (if you have MyChart) OR A paper copy in the mail If you have any lab test that is abnormal or we need to change your treatment, we will call you to review the results.   Testing/Procedures: None ordered    Follow-Up: Follow up as scheduled    Other Instructions None     Signed, Angelena Form, PA-C  07/25/2021 8:32 PM    [facility]

## 2021-07-25 ENCOUNTER — Ambulatory Visit (HOSPITAL_COMMUNITY): Payer: Medicare Other | Attending: Cardiology

## 2021-07-25 ENCOUNTER — Ambulatory Visit (INDEPENDENT_AMBULATORY_CARE_PROVIDER_SITE_OTHER): Payer: Medicare Other | Admitting: Physician Assistant

## 2021-07-25 ENCOUNTER — Encounter: Payer: Self-pay | Admitting: Physician Assistant

## 2021-07-25 ENCOUNTER — Other Ambulatory Visit: Payer: Self-pay

## 2021-07-25 VITALS — BP 100/76 | HR 85 | Ht 61.0 in | Wt 107.0 lb

## 2021-07-25 DIAGNOSIS — N1832 Chronic kidney disease, stage 3b: Secondary | ICD-10-CM

## 2021-07-25 DIAGNOSIS — I712 Thoracic aortic aneurysm, without rupture, unspecified: Secondary | ICD-10-CM | POA: Diagnosis not present

## 2021-07-25 DIAGNOSIS — N184 Chronic kidney disease, stage 4 (severe): Secondary | ICD-10-CM | POA: Diagnosis not present

## 2021-07-25 DIAGNOSIS — I4819 Other persistent atrial fibrillation: Secondary | ICD-10-CM | POA: Diagnosis not present

## 2021-07-25 DIAGNOSIS — I5032 Chronic diastolic (congestive) heart failure: Secondary | ICD-10-CM

## 2021-07-25 DIAGNOSIS — I10 Essential (primary) hypertension: Secondary | ICD-10-CM | POA: Diagnosis not present

## 2021-07-25 DIAGNOSIS — Z952 Presence of prosthetic heart valve: Secondary | ICD-10-CM

## 2021-07-25 LAB — ECHOCARDIOGRAM COMPLETE
AR max vel: 1.14 cm2
AV Area VTI: 1.33 cm2
AV Area mean vel: 1.2 cm2
AV Mean grad: 11 mmHg
AV Peak grad: 18.5 mmHg
Ao pk vel: 2.15 m/s
Area-P 1/2: 5.31 cm2
S' Lateral: 2.7 cm

## 2021-07-25 NOTE — Patient Instructions (Signed)

## 2021-07-27 NOTE — Progress Notes (Signed)
Attempted to obtain medical history via telephone, unable to reach at this time. I left a voicemail to return pre surgical testing department's phone call.  

## 2021-08-02 DIAGNOSIS — D631 Anemia in chronic kidney disease: Secondary | ICD-10-CM | POA: Diagnosis not present

## 2021-08-02 DIAGNOSIS — I129 Hypertensive chronic kidney disease with stage 1 through stage 4 chronic kidney disease, or unspecified chronic kidney disease: Secondary | ICD-10-CM | POA: Diagnosis not present

## 2021-08-02 DIAGNOSIS — N184 Chronic kidney disease, stage 4 (severe): Secondary | ICD-10-CM | POA: Diagnosis not present

## 2021-08-02 DIAGNOSIS — N2581 Secondary hyperparathyroidism of renal origin: Secondary | ICD-10-CM | POA: Diagnosis not present

## 2021-08-02 DIAGNOSIS — E875 Hyperkalemia: Secondary | ICD-10-CM | POA: Diagnosis not present

## 2021-08-02 DIAGNOSIS — I350 Nonrheumatic aortic (valve) stenosis: Secondary | ICD-10-CM | POA: Diagnosis not present

## 2021-08-09 ENCOUNTER — Ambulatory Visit (HOSPITAL_COMMUNITY): Payer: Medicare Other | Admitting: Certified Registered Nurse Anesthetist

## 2021-08-09 ENCOUNTER — Ambulatory Visit (HOSPITAL_COMMUNITY)
Admission: RE | Admit: 2021-08-09 | Discharge: 2021-08-09 | Disposition: A | Discharge status: Home / Self Care | Payer: Medicare Other | Attending: Cardiology | Admitting: Cardiology

## 2021-08-09 ENCOUNTER — Other Ambulatory Visit: Payer: Self-pay

## 2021-08-09 ENCOUNTER — Ambulatory Visit (HOSPITAL_COMMUNITY)
Admission: RE | Admit: 2021-08-09 | Discharge: 2021-08-09 | Disposition: A | Discharge status: Home / Self Care | Payer: Medicare Other | Source: Ambulatory Visit | Attending: Physician Assistant | Admitting: Physician Assistant

## 2021-08-09 ENCOUNTER — Encounter (HOSPITAL_COMMUNITY)
Admission: RE | Disposition: A | Discharge status: Home / Self Care | Payer: Self-pay | Source: Home / Self Care | Attending: Cardiology

## 2021-08-09 DIAGNOSIS — I7122 Aneurysm of the aortic arch, without rupture: Secondary | ICD-10-CM | POA: Insufficient documentation

## 2021-08-09 DIAGNOSIS — G43909 Migraine, unspecified, not intractable, without status migrainosus: Secondary | ICD-10-CM | POA: Insufficient documentation

## 2021-08-09 DIAGNOSIS — M1990 Unspecified osteoarthritis, unspecified site: Secondary | ICD-10-CM | POA: Diagnosis not present

## 2021-08-09 DIAGNOSIS — E785 Hyperlipidemia, unspecified: Secondary | ICD-10-CM | POA: Diagnosis not present

## 2021-08-09 DIAGNOSIS — I480 Paroxysmal atrial fibrillation: Secondary | ICD-10-CM | POA: Diagnosis not present

## 2021-08-09 DIAGNOSIS — I4891 Unspecified atrial fibrillation: Secondary | ICD-10-CM | POA: Diagnosis not present

## 2021-08-09 DIAGNOSIS — Z79899 Other long term (current) drug therapy: Secondary | ICD-10-CM | POA: Diagnosis not present

## 2021-08-09 DIAGNOSIS — I38 Endocarditis, valve unspecified: Secondary | ICD-10-CM | POA: Diagnosis not present

## 2021-08-09 DIAGNOSIS — Z7901 Long term (current) use of anticoagulants: Secondary | ICD-10-CM | POA: Diagnosis not present

## 2021-08-09 DIAGNOSIS — I5032 Chronic diastolic (congestive) heart failure: Secondary | ICD-10-CM | POA: Insufficient documentation

## 2021-08-09 DIAGNOSIS — N1832 Chronic kidney disease, stage 3b: Secondary | ICD-10-CM | POA: Diagnosis not present

## 2021-08-09 DIAGNOSIS — Z953 Presence of xenogenic heart valve: Secondary | ICD-10-CM | POA: Diagnosis not present

## 2021-08-09 DIAGNOSIS — N289 Disorder of kidney and ureter, unspecified: Secondary | ICD-10-CM | POA: Diagnosis not present

## 2021-08-09 DIAGNOSIS — K219 Gastro-esophageal reflux disease without esophagitis: Secondary | ICD-10-CM | POA: Insufficient documentation

## 2021-08-09 DIAGNOSIS — J45909 Unspecified asthma, uncomplicated: Secondary | ICD-10-CM | POA: Diagnosis not present

## 2021-08-09 DIAGNOSIS — I130 Hypertensive heart and chronic kidney disease with heart failure and stage 1 through stage 4 chronic kidney disease, or unspecified chronic kidney disease: Secondary | ICD-10-CM | POA: Insufficient documentation

## 2021-08-09 DIAGNOSIS — I712 Thoracic aortic aneurysm, without rupture, unspecified: Secondary | ICD-10-CM | POA: Insufficient documentation

## 2021-08-09 HISTORY — PX: CARDIOVERSION: SHX1299

## 2021-08-09 LAB — POCT I-STAT, CHEM 8
BUN: 89 mg/dL — ABNORMAL HIGH (ref 8–23)
Calcium, Ion: 1.14 mmol/L — ABNORMAL LOW (ref 1.15–1.40)
Chloride: 101 mmol/L (ref 98–111)
Creatinine, Ser: 2.7 mg/dL — ABNORMAL HIGH (ref 0.44–1.00)
Glucose, Bld: 103 mg/dL — ABNORMAL HIGH (ref 70–99)
HCT: 38 % (ref 36.0–46.0)
Hemoglobin: 12.9 g/dL (ref 12.0–15.0)
Potassium: 7 mmol/L (ref 3.5–5.1)
Sodium: 136 mmol/L (ref 135–145)
TCO2: 30 mmol/L (ref 22–32)

## 2021-08-09 LAB — BASIC METABOLIC PANEL
Anion gap: 11 (ref 5–15)
BUN: 52 mg/dL — ABNORMAL HIGH (ref 8–23)
CO2: 28 mmol/L (ref 22–32)
Calcium: 10 mg/dL (ref 8.9–10.3)
Chloride: 102 mmol/L (ref 98–111)
Creatinine, Ser: 2.53 mg/dL — ABNORMAL HIGH (ref 0.44–1.00)
GFR, Estimated: 18 mL/min — ABNORMAL LOW (ref >60)
Glucose, Bld: 108 mg/dL — ABNORMAL HIGH (ref 70–99)
Potassium: 4.9 mmol/L (ref 3.5–5.1)
Sodium: 141 mmol/L (ref 135–145)

## 2021-08-09 LAB — CBC
HCT: 36.7 % (ref 36.0–46.0)
Hemoglobin: 12.1 g/dL (ref 12.0–15.0)
MCH: 32 pg (ref 26.0–34.0)
MCHC: 33 g/dL (ref 30.0–36.0)
MCV: 97.1 fL (ref 80.0–100.0)
Platelets: 171 10*3/uL (ref 150–400)
RBC: 3.78 MIL/uL — ABNORMAL LOW (ref 3.87–5.11)
RDW: 14 % (ref 11.5–15.5)
WBC: 4.2 10*3/uL (ref 4.0–10.5)
nRBC: 0 % (ref 0.0–0.2)

## 2021-08-09 SURGERY — CARDIOVERSION
Anesthesia: General

## 2021-08-09 MED ORDER — SODIUM CHLORIDE 0.9 % IV SOLN: INTRAVENOUS | Status: DC

## 2021-08-09 MED ORDER — PROPOFOL 10 MG/ML IV BOLUS
INTRAVENOUS | Status: DC | PRN
  Administered 2021-08-09: 50 mg via INTRAVENOUS

## 2021-08-09 MED ORDER — LIDOCAINE 2% (20 MG/ML) 5 ML SYRINGE
INTRAMUSCULAR | Status: DC | PRN
  Administered 2021-08-09: 60 mg via INTRAVENOUS

## 2021-08-09 NOTE — Anesthesia Procedure Notes (Signed)
Procedure Name: General with mask airway Date/Time: 08/09/2021 10:25 AM Performed by: Carolan Clines, CRNA Pre-anesthesia Checklist: Patient identified, Emergency Drugs available, Suction available and Patient being monitored Patient Re-evaluated:Patient Re-evaluated prior to induction Oxygen Delivery Method: Ambu bag Preoxygenation: Pre-oxygenation with 100% oxygen Induction Type: IV induction Dental Injury: Teeth and Oropharynx as per pre-operative assessment

## 2021-08-09 NOTE — Interval H&P Note (Signed)
History and Physical Interval Note:  08/09/2021 84:83 AM  [name] [name]  has presented today for surgery, with the diagnosis of AFIB.  The various methods of treatment have been discussed with the patient and family. After consideration of risks, benefits and other options for treatment, the patient has consented to  Procedure(s): CARDIOVERSION (N/A) as a surgical intervention.  The patient's history has been reviewed, patient examined, no change in status, stable for surgery.  I have reviewed the patient's chart and labs.  Questions were answered to the patient's satisfaction.     [employer]

## 2021-08-09 NOTE — CV Procedure (Signed)
° ° °  Electrical Cardioversion Procedure Note [name] [name] [id number] [birth year]  Procedure: Electrical Cardioversion Indications:  Atrial Fibrillation  Time Out: Verified patient identification, verified procedure,medications/allergies/relevent history reviewed, required imaging and test results available.  Performed  Procedure Details  The patient was NPO after midnight. Anesthesia was administered at the beside  by Dr.Hodierne with propofol.  Cardioversion was performed with synchronized biphasic defibrillation via AP pads with 120 joules.  1 attempt(s) were performed.  The patient converted to normal sinus rhythm. The patient tolerated the procedure well   IMPRESSION:  Successful cardioversion of atrial fibrillation    Candee Furbish 08/09/2021, 11:30 AM

## 2021-08-09 NOTE — Transfer of Care (Signed)
Immediate Anesthesia Transfer of Care Note  Patient: [name] [name]  Procedure(s) Performed: CARDIOVERSION  Patient Location: Endoscopy Unit  Anesthesia Type:General  Level of Consciousness: awake, alert  and oriented  Airway & Oxygen Therapy: Patient Spontanous Breathing  Post-op Assessment: Report given to RN and Post -op Vital signs reviewed and stable  Post vital signs: Reviewed and stable  Last Vitals:  Vitals Value Taken Time  BP 89/38   Temp    Pulse 44   Resp 18   SpO2 100     Last Pain:  Vitals:   08/09/21 1014  TempSrc: Oral  PainSc: 0-No pain         Complications: No notable events documented.

## 2021-08-09 NOTE — Anesthesia Preprocedure Evaluation (Signed)
Anesthesia Evaluation  Patient identified by MRN, date of birth, ID band Patient awake    Reviewed: Allergy & Precautions, H&P , NPO status , Patient's Chart, lab work & pertinent test results  Airway Mallampati: II   Neck ROM: full    Dental   Pulmonary asthma ,    breath sounds clear to auscultation       Cardiovascular + dysrhythmias Atrial Fibrillation + Valvular Problems/Murmurs  Rhythm:regular Rate:Normal  S/p AVR 2004  S/p TAVR 06/2021   Neuro/Psych  Headaches,    GI/Hepatic GERD  ,  Endo/Other    Renal/GU Renal InsufficiencyRenal disease     Musculoskeletal  (+) Arthritis ,   Abdominal   Peds  Hematology   Anesthesia Other Findings   Reproductive/Obstetrics                             Anesthesia Physical Anesthesia Plan  ASA: 3  Anesthesia Plan: General   Post-op Pain Management:    Induction: Intravenous  PONV Risk Score and Plan: 3 and Propofol infusion and Treatment may vary due to age or medical condition  Airway Management Planned: Mask  Additional Equipment:   Intra-op Plan:   Post-operative Plan:   Informed Consent: I have reviewed the patients History and Physical, chart, labs and discussed the procedure including the risks, benefits and alternatives for the proposed anesthesia with the patient or authorized representative who has indicated his/her understanding and acceptance.     Dental advisory given  Plan Discussed with: CRNA, Anesthesiologist and Surgeon  Anesthesia Plan Comments:         Anesthesia Quick Evaluation

## 2021-08-10 ENCOUNTER — Other Ambulatory Visit: Payer: Self-pay | Admitting: Allergy and Immunology

## 2021-08-10 NOTE — Anesthesia Postprocedure Evaluation (Signed)
Anesthesia Post Note  Patient: [name] [name]  Procedure(s) Performed: CARDIOVERSION     Patient location during evaluation: Endoscopy Anesthesia Type: General Level of consciousness: awake and alert Pain management: pain level controlled Vital Signs Assessment: post-procedure vital signs reviewed and stable Respiratory status: spontaneous breathing, nonlabored ventilation, respiratory function stable and patient connected to nasal cannula oxygen Cardiovascular status: blood pressure returned to baseline and stable Postop Assessment: no apparent nausea or vomiting Anesthetic complications: no   No notable events documented.  Last Vitals:  Vitals:   08/09/21 1210 08/09/21 1216  BP: (!) 119/49   Pulse: (!) 48   Resp: 18 17  Temp:    SpO2: 96%     Last Pain:  Vitals:   08/09/21 1204  TempSrc:   PainSc: 0-No pain                 Candido Flott S

## 2021-08-13 ENCOUNTER — Encounter (HOSPITAL_COMMUNITY): Payer: Self-pay | Admitting: Cardiology

## 2021-08-14 DIAGNOSIS — Z954 Presence of other heart-valve replacement: Secondary | ICD-10-CM | POA: Diagnosis not present

## 2021-08-14 DIAGNOSIS — I4892 Unspecified atrial flutter: Secondary | ICD-10-CM | POA: Diagnosis not present

## 2021-08-14 DIAGNOSIS — I10 Essential (primary) hypertension: Secondary | ICD-10-CM | POA: Diagnosis not present

## 2021-08-14 DIAGNOSIS — I4891 Unspecified atrial fibrillation: Secondary | ICD-10-CM | POA: Diagnosis not present

## 2021-08-15 DIAGNOSIS — I10 Essential (primary) hypertension: Secondary | ICD-10-CM | POA: Diagnosis not present

## 2021-08-15 DIAGNOSIS — Z954 Presence of other heart-valve replacement: Secondary | ICD-10-CM | POA: Diagnosis not present

## 2021-08-15 DIAGNOSIS — I4892 Unspecified atrial flutter: Secondary | ICD-10-CM | POA: Diagnosis not present

## 2021-08-15 DIAGNOSIS — I4891 Unspecified atrial fibrillation: Secondary | ICD-10-CM | POA: Diagnosis not present

## 2021-08-17 ENCOUNTER — Ambulatory Visit (HOSPITAL_COMMUNITY)
Admission: RE | Admit: 2021-08-17 | Discharge: 2021-08-17 | Disposition: A | Discharge status: Home / Self Care | Payer: Medicare Other | Source: Ambulatory Visit | Attending: Physician Assistant | Admitting: Physician Assistant

## 2021-08-17 ENCOUNTER — Other Ambulatory Visit: Payer: Self-pay

## 2021-08-17 VITALS — BP 136/70 | HR 42 | Ht 61.0 in | Wt 110.4 lb

## 2021-08-17 DIAGNOSIS — I5032 Chronic diastolic (congestive) heart failure: Secondary | ICD-10-CM | POA: Diagnosis not present

## 2021-08-17 DIAGNOSIS — I350 Nonrheumatic aortic (valve) stenosis: Secondary | ICD-10-CM | POA: Insufficient documentation

## 2021-08-17 DIAGNOSIS — I447 Left bundle-branch block, unspecified: Secondary | ICD-10-CM | POA: Insufficient documentation

## 2021-08-17 DIAGNOSIS — R001 Bradycardia, unspecified: Secondary | ICD-10-CM | POA: Diagnosis not present

## 2021-08-17 DIAGNOSIS — I712 Thoracic aortic aneurysm, without rupture, unspecified: Secondary | ICD-10-CM | POA: Insufficient documentation

## 2021-08-17 DIAGNOSIS — I4819 Other persistent atrial fibrillation: Secondary | ICD-10-CM | POA: Insufficient documentation

## 2021-08-17 DIAGNOSIS — Z7901 Long term (current) use of anticoagulants: Secondary | ICD-10-CM | POA: Insufficient documentation

## 2021-08-17 DIAGNOSIS — I4892 Unspecified atrial flutter: Secondary | ICD-10-CM | POA: Diagnosis not present

## 2021-08-17 DIAGNOSIS — D6869 Other thrombophilia: Secondary | ICD-10-CM | POA: Insufficient documentation

## 2021-08-17 MED ORDER — AMIODARONE HCL 200 MG PO TABS: 100.0000 mg | ORAL_TABLET | Freq: Every day | ORAL | 2 refills | Status: DC

## 2021-08-17 MED ORDER — TORSEMIDE 10 MG PO TABS: 10.0000 mg | ORAL_TABLET | Freq: Every morning | ORAL | Status: DC

## 2021-08-17 NOTE — Patient Instructions (Signed)
Decrease amiodarone to 100mg once a day  

## 2021-08-17 NOTE — Progress Notes (Signed)
Primary Care Physician: [name], MD Primary Cardiologist: Dr [name] Primary Electrophysiologist: none Referring Physician: [name] is a 81 y.o. female with a history of thoracic aortic aneurysm and AS s/p repair 2004, paroxysmal atrial fibrillation, atrial flutter who presents for follow up in the [facility]. The patient was initially diagnosed with atrial fibrillation in 2004 in the setting of her valve surgery. She had done well since that time with no known recurrence of afib. Patient has a CHADS2VASC score of 3. Patient was in her usual state of health until 05/14/20 when she began having heart "fluttering." These symptoms felt similar to her palpitations after her surgery. ECG 05/15/20 shows afib with RVR. There were no triggers that she could identify. She was started on Eliquis for stroke prevention. Patient is s/p DCCV on 06/05/20.   Patient seen by Dr [name] and symptoms of fluid retention and SOB were felt to be related to her valve disease. She underwent "valve in valve" TAVR on 06/19/21. She states that since the surgery she feels much better with resolution of her SOB.   On follow up today, patient is s/p DCCV on 08/09/21. She reports that she feels much improved with more energy and no palpitations. She denies any dizziness or presyncope. No bleeding issues on anticoagulation.   Today, she denies symptoms of palpitations, chest pain, orthopnea, PND, lower extremity edema, dizziness, presyncope, syncope, snoring, daytime somnolence, bleeding, or neurologic sequela. The patient is tolerating medications without difficulties and is otherwise without complaint today.    Atrial Fibrillation Risk Factors:  she does not have symptoms or diagnosis of sleep apnea. she does not have a history of rheumatic fever. she does not have a history of alcohol use.   she has a BMI of Body mass index is 20.86 kg/m.[name] Filed  Weights   08/17/21 1057  Weight: 50.1 kg      Family History  Problem Relation Age of Onset   Heart attack Father      Atrial Fibrillation Management history:  Previous antiarrhythmic drugs: amiodarone Previous cardioversions: 2013, 06/05/20, 08/09/21 Previous ablations: none CHADS2VASC score: 4 Anticoagulation history: Eliquis   Past Medical History:  Diagnosis Date   Anemia    Aortic root dilatation (HCC) 05/11/2012   CTA CHEST   Arthritis    Cardiac asystole, post DCCV requiring use of temp. external pacemaker leads 07/19/2012   Chronic kidney disease    Stage 4   GERD (gastroesophageal reflux disease)    H/O aortic valve disorder 10/04/2002   Dr. 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 McAlhany & Bartle   Thoracic ascending aortic aneurysm    CTA CHEST 05/11/12   Past Surgical History:  Procedure Laterality Date   ABDOMINAL HYSTERECTOMY     AORTIC VALVE REPLACEMENT  10/04/2002    Dr. Tharon Aquas Trigt    #21 pericardial stented valve   CARDIOVERSION  07/15/2012   Procedure: CARDIOVERSION;  Surgeon: Pixie Casino, MD;  Location: [facility] ENDOSCOPY;  Service: Cardiovascular;  Laterality: N/A;   CARDIOVERSION N/A 06/05/2020   Procedure: CARDIOVERSION;  Surgeon: Sueanne Margarita, MD;  Location: [facility] ENDOSCOPY;  Service: Cardiovascular;  Laterality: N/A;   CARDIOVERSION N/A 08/09/2021   Procedure: CARDIOVERSION;  Surgeon: Jerline Pain, MD;  Location: [facility] ENDOSCOPY;  Service: Cardiovascular;  Laterality: N/A;   EYE SURGERY     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 CATH LAB;  Service: Cardiovascular;  Laterality: N/A;   NM MYOCAR PERF WALL MOTION  10/29/2010   normal   REPLACEMENT ASCENDING AORTA  07/07/2012   Procedure: REPLACEMENT ASCENDING AORTA;  Surgeon: Ivin Poot, MD;  Location: [place];  Service: Open Heart Surgery;  Laterality: N/A;  CIRCULATORY ARREST   RIGHT HEART CATH AND CORONARY ANGIOGRAPHY N/A 04/19/2021   Procedure: RIGHT HEART CATH AND CORONARY ANGIOGRAPHY;  Surgeon: Burnell Blanks, MD;  Location: [place] CV LAB;  Service: Cardiovascular;  Laterality: N/A;   TEE WITHOUT CARDIOVERSION  06/18/2012   Procedure: TRANSESOPHAGEAL ECHOCARDIOGRAM (TEE);  Surgeon: Sanda Klein, MD;  Location: [facility] ENDOSCOPY;  Service: Cardiovascular;  Laterality: N/A;  right and left heart cath after this procedure   TEE WITHOUT CARDIOVERSION  07/15/2012   Procedure: TRANSESOPHAGEAL ECHOCARDIOGRAM (TEE);  Surgeon: Pixie Casino, MD;  Location: [facility] ENDOSCOPY;  Service: Cardiovascular;  Laterality: N/A;   TEE WITHOUT CARDIOVERSION N/A 03/27/2021   Procedure: TRANSESOPHAGEAL ECHOCARDIOGRAM (TEE);  Surgeon: Sanda Klein, MD;  Location: [place];  Service: Cardiovascular;  Laterality: N/A;   TONSILLECTOMY     TRANSCATHETER AORTIC VALVE REPLACEMENT, TRANSFEMORAL N/A 06/19/2021   Procedure: TRANSCATHETER AORTIC VALVE REPLACEMENT, TRANSFEMORAL USING A 23MM EDWARDS VALVE;  Surgeon: Burnell Blanks, MD;  Location: [place];  Service: Open Heart Surgery;  Laterality: N/A;    Current Outpatient Medications  Medication Sig Dispense Refill   albuterol (VENTOLIN HFA) 108 (90 Base) MCG/ACT inhaler Inhale 2 puffs into the lungs every 6 (six)  hours as needed for wheezing or shortness of breath.     amoxicillin (AMOXIL) 500 MG tablet Take 500 mg by mouth. Pt will need Amoxicillin 2 grams prior to dental procedures     apixaban (ELIQUIS) 2.5 MG TABS tablet Take 1 tablet (2.5 mg total) by mouth 2 (two) times daily. 60 tablet 11   azelastine (ASTELIN) 0.1 % nasal spray Place 2 sprays into both nostrils daily as needed for allergies.     b complex vitamins capsule Take 1 capsule by mouth daily.     Calcium Carbonate-Vit D-Min (CALTRATE MINIS PLUS MINERALS PO) Take 40 mcg by mouth every morning.     cholecalciferol (VITAMIN D) 25 MCG (1000 UNIT) tablet Take 1,000 Units by mouth every evening.     FARXIGA 10 MG TABS tablet Take 10 mg by mouth daily.     ipratropium (ATROVENT) 0.06 % nasal spray 2 sprays in each nostril every 6 hours if needed (Patient taking differently: 2 sprays every 6 (six) hours as needed for rhinitis.) 15 mL 5   ipratropium-albuterol (DUONEB) 0.5-2.5 (3) MG/3ML SOLN Inhale 3 mLs into the lungs every 6 (six) hours as needed (shortness of breath or wheezing).     metoprolol tartrate (LOPRESSOR) 25 MG tablet Take 0.5 tablets (12.5 mg total)  by mouth 2 (two) times daily. 180 tablet 3   multivitamin-lutein (OCUVITE-LUTEIN) CAPS capsule Take 1 capsule by mouth daily.     ondansetron (ZOFRAN) 4 MG tablet Take 4 mg by mouth every 8 (eight) hours as needed for nausea or vomiting.     pantoprazole (PROTONIX) 40 MG tablet TAKE ONE TABLET BY MOUTH ONCE DAILY BEFORE meal of choice 90 tablet 2   Polyethyl Glycol-Propyl Glycol (SYSTANE) 0.4-0.3 % SOLN Place 1 drop into both eyes 2 (two) times daily as needed (dry eyes).     pravastatin (PRAVACHOL) 10 MG tablet Take 10 mg by mouth every evening.     SUMAtriptan (IMITREX) 100 MG tablet Take 100 mg by mouth every 2 (two) hours as needed for migraine. May repeat in 2 hours if headache persists or recurs.     amiodarone (PACERONE) 200 MG tablet Take 0.5 tablets (100 mg total) by mouth daily.  90 tablet 2   torsemide (DEMADEX) 10 MG tablet Take 1 tablet (10 mg total) by mouth every morning.     No current facility-administered medications for this encounter.    Allergies  Allergen Reactions   Other Nausea And Vomiting    Headache also  Also from artificial sweeetners   Codeine Nausea And Vomiting    Social History   Socioeconomic History   Marital status: Widowed    Spouse name: Not on file   Number of children: 1   Years of education: Not on file   Highest education level: Not on file  Occupational History   Occupation: Retired-realtor  Tobacco Use   Smoking status: Never   Smokeless tobacco: Never  Vaping Use   Vaping Use: Never used  Substance and Sexual Activity   Alcohol use: No   Drug use: No   Sexual activity: Not Currently  Other Topics Concern   Not on file  Social History Narrative   Not on file   Social Determinants of Health   Financial Resource Strain: Not on file  Food Insecurity: Not on file  Transportation Needs: Not on file  Physical Activity: Not on file  Stress: Not on file  Social Connections: Not on file  Intimate Partner Violence: Not on file     ROS- All systems are reviewed and negative except as per the HPI above.  Physical Exam: Vitals:   08/17/21 1057  BP: 136/70  Pulse: (!) 42  Weight: 50.1 kg  Height: 5\' 1"  (1.549 m)    GEN- The patient is a well appearing elderly female, alert and oriented x 3 today.   HEENT-head normocephalic, atraumatic, sclera clear, conjunctiva pink, hearing intact, trachea midline. Lungs- Clear to ausculation bilaterally, normal work of breathing Heart- Regular rate and rhythm, bradycardia, no murmurs, rubs or gallops  GI- soft, NT, ND, + BS Extremities- no clubbing, cyanosis, or edema MS- no significant deformity or atrophy Skin- no rash or lesion Psych- euthymic mood, full affect Neuro- strength and sensation are intact   Wt Readings from Last 3 Encounters:  08/17/21 50.1 kg   08/09/21 49.9 kg  07/25/21 48.5 kg    EKG today demonstrates  SB, LBBB, 1st degree AV block Vent. rate 42 BPM PR interval 266 ms QRS duration 156 ms QT/QTcB 564/470 ms  Echo 07/25/21 demonstrated   1. Left ventricular ejection fraction, by estimation, is 55 to 60%. The  left ventricle has normal function. The left ventricle has no regional  wall motion abnormalities. There is moderate concentric left ventricular  hypertrophy. Left  ventricular  diastolic parameters are indeterminate.   2. Right ventricular systolic function is low normal. The right  ventricular size is normal. There is normal pulmonary artery systolic  pressure.   3. Left atrial size was moderately dilated.   4. Right atrial size was mildly dilated.   5. The mitral valve is normal in structure. Trivial mitral valve  regurgitation. No evidence of mitral stenosis.   6. The aortic valve has been repaired/replaced. Aortic valve  regurgitation is not visualized. There is a 23 mm Sapien prosthetic (TAVR) valve present in the aortic position. Procedure Date: 06/19/2021. Echo findings are consistent with normal structure  and function of the aortic valve prosthesis. Aortic valve area, by VTI  measures 1.33 cm. Aortic valve mean gradient measures 11.0 mmHg. Aortic  valve Vmax measures 2.15 m/s.   7. The inferior vena cava is normal in size with greater than 50%  respiratory variability, suggesting right atrial pressure of 3 mmHg.   Comparison(s): No significant change from prior study. Prior images  reviewed side by side.   Conclusion(s)/Recommendation(s): S/P valve in valve TAVR. Gradients  improved from prior study.   Epic records are reviewed at length today  CHA2DS2-VASc Score = 4  The patient's score is based upon: CHF History: 1 HTN History: 0 Diabetes History: 0 Stroke History: 0 Vascular Disease History: 0 Age Score: 2 Gender Score: 1        ASSESSMENT AND PLAN: 1. Persistent Atrial  Fibrillation/typical atrial flutter The patient's CHA2DS2-VASc score is 4, indicating a 4.8% annual risk of stroke.   S/p DCCV 08/09/21 Patient back in SR Will decrease amiodarone back to 100 mg daily. Recheck ECG in a month given bradycardia and new LBBB. Continue Eliquis 2.5 mg BID (age, weight) Continue Lopressor 12.5 mg BID  2. Secondary Hypercoagulable State (ICD10:  D68.69) The patient is at significant risk for stroke/thromboembolism based upon her CHA2DS2-VASc Score of 4.  Continue Apixaban (Eliquis).   3. Severe AS S/p valve in valve TAVR 06/19/21  4. Chronic diastolic CHF No signs or symptoms of fluid overload today.    Follow up in the AF clinic in one month and with Dr [name] as scheduled.    [facility] [address] [place], [place] [address] [phone] 08/17/2021 12:48 PM

## 2021-08-20 ENCOUNTER — Other Ambulatory Visit (HOSPITAL_COMMUNITY): Payer: Self-pay

## 2021-08-20 DIAGNOSIS — I4891 Unspecified atrial fibrillation: Secondary | ICD-10-CM | POA: Diagnosis not present

## 2021-08-20 DIAGNOSIS — I10 Essential (primary) hypertension: Secondary | ICD-10-CM | POA: Diagnosis not present

## 2021-08-20 DIAGNOSIS — I4892 Unspecified atrial flutter: Secondary | ICD-10-CM | POA: Diagnosis not present

## 2021-08-20 DIAGNOSIS — Z954 Presence of other heart-valve replacement: Secondary | ICD-10-CM | POA: Diagnosis not present

## 2021-08-20 MED ORDER — APIXABAN 2.5 MG PO TABS: 2.5000 mg | ORAL_TABLET | Freq: Two times a day (BID) | ORAL | 0 refills | Status: DC

## 2021-08-22 DIAGNOSIS — I4892 Unspecified atrial flutter: Secondary | ICD-10-CM | POA: Diagnosis not present

## 2021-08-22 DIAGNOSIS — I10 Essential (primary) hypertension: Secondary | ICD-10-CM | POA: Diagnosis not present

## 2021-08-22 DIAGNOSIS — I4891 Unspecified atrial fibrillation: Secondary | ICD-10-CM | POA: Diagnosis not present

## 2021-08-22 DIAGNOSIS — Z954 Presence of other heart-valve replacement: Secondary | ICD-10-CM | POA: Diagnosis not present

## 2021-08-24 DIAGNOSIS — I10 Essential (primary) hypertension: Secondary | ICD-10-CM | POA: Diagnosis not present

## 2021-08-24 DIAGNOSIS — I4892 Unspecified atrial flutter: Secondary | ICD-10-CM | POA: Diagnosis not present

## 2021-08-24 DIAGNOSIS — I4891 Unspecified atrial fibrillation: Secondary | ICD-10-CM | POA: Diagnosis not present

## 2021-08-24 DIAGNOSIS — Z954 Presence of other heart-valve replacement: Secondary | ICD-10-CM | POA: Diagnosis not present

## 2021-08-27 ENCOUNTER — Telehealth: Payer: Self-pay | Admitting: Cardiovascular Disease

## 2021-08-27 DIAGNOSIS — Z954 Presence of other heart-valve replacement: Secondary | ICD-10-CM | POA: Diagnosis not present

## 2021-08-27 DIAGNOSIS — I10 Essential (primary) hypertension: Secondary | ICD-10-CM | POA: Diagnosis not present

## 2021-08-27 DIAGNOSIS — I4891 Unspecified atrial fibrillation: Secondary | ICD-10-CM | POA: Diagnosis not present

## 2021-08-27 DIAGNOSIS — I4892 Unspecified atrial flutter: Secondary | ICD-10-CM | POA: Diagnosis not present

## 2021-08-27 NOTE — Telephone Encounter (Signed)
STAT if HR is under 50 or over 120 (normal HR is 60-100 beats per minute)  What is your heart rate? 48 at end of Cardiac Rehab at 12:30 pm  Do you have a log of your heart rate readings (document readings)? Not written down   Do you have any other symptoms? No   Cardiac Rehab instructed her to call her Cardiologist to report her low HR.  Patient states she is able to complete exercises during rehab without getting SOB

## 2021-08-27 NOTE — Telephone Encounter (Signed)
Spoke to pt she states she has been going to rehab for a week and her heart rate has not gotten over the low 50s when leaving, today it was 48. She denies symptoms, no dizziness. "It's the concern of the therapist that are doing the rehab has that's the reason I'm calling." Current heart rate 60 bpm. Will get message to Dr. [name] for review.

## 2021-08-29 DIAGNOSIS — Z954 Presence of other heart-valve replacement: Secondary | ICD-10-CM | POA: Diagnosis not present

## 2021-08-29 DIAGNOSIS — M549 Dorsalgia, unspecified: Secondary | ICD-10-CM | POA: Diagnosis not present

## 2021-08-29 DIAGNOSIS — G319 Degenerative disease of nervous system, unspecified: Secondary | ICD-10-CM | POA: Diagnosis not present

## 2021-08-29 DIAGNOSIS — S0990XA Unspecified injury of head, initial encounter: Secondary | ICD-10-CM | POA: Diagnosis not present

## 2021-08-29 DIAGNOSIS — I4891 Unspecified atrial fibrillation: Secondary | ICD-10-CM | POA: Diagnosis not present

## 2021-08-29 DIAGNOSIS — I4892 Unspecified atrial flutter: Secondary | ICD-10-CM | POA: Diagnosis not present

## 2021-08-29 DIAGNOSIS — M4312 Spondylolisthesis, cervical region: Secondary | ICD-10-CM | POA: Diagnosis not present

## 2021-08-29 DIAGNOSIS — M47812 Spondylosis without myelopathy or radiculopathy, cervical region: Secondary | ICD-10-CM | POA: Diagnosis not present

## 2021-08-29 DIAGNOSIS — M546 Pain in thoracic spine: Secondary | ICD-10-CM | POA: Diagnosis not present

## 2021-08-29 DIAGNOSIS — I10 Essential (primary) hypertension: Secondary | ICD-10-CM | POA: Diagnosis not present

## 2021-08-29 DIAGNOSIS — M47816 Spondylosis without myelopathy or radiculopathy, lumbar region: Secondary | ICD-10-CM | POA: Diagnosis not present

## 2021-08-29 DIAGNOSIS — S199XXA Unspecified injury of neck, initial encounter: Secondary | ICD-10-CM | POA: Diagnosis not present

## 2021-08-29 DIAGNOSIS — S32010A Wedge compression fracture of first lumbar vertebra, initial encounter for closed fracture: Secondary | ICD-10-CM | POA: Diagnosis not present

## 2021-08-29 DIAGNOSIS — M47814 Spondylosis without myelopathy or radiculopathy, thoracic region: Secondary | ICD-10-CM | POA: Diagnosis not present

## 2021-08-29 DIAGNOSIS — M545 Low back pain, unspecified: Secondary | ICD-10-CM | POA: Diagnosis not present

## 2021-08-29 NOTE — Telephone Encounter (Signed)
Called pt to relay Dr. Kennon Holter message. Pt encouraged to reach out to [place] if she does start to experience symptoms,  she verbalized understanding. No further concerns expressed at this time.

## 2021-09-01 ENCOUNTER — Other Ambulatory Visit: Payer: Self-pay | Admitting: Cardiovascular Disease

## 2021-09-06 DIAGNOSIS — S32010A Wedge compression fracture of first lumbar vertebra, initial encounter for closed fracture: Secondary | ICD-10-CM | POA: Diagnosis not present

## 2021-09-06 DIAGNOSIS — Z6821 Body mass index (BMI) 21.0-21.9, adult: Secondary | ICD-10-CM | POA: Diagnosis not present

## 2021-09-06 DIAGNOSIS — Y92009 Unspecified place in unspecified non-institutional (private) residence as the place of occurrence of the external cause: Secondary | ICD-10-CM | POA: Diagnosis not present

## 2021-09-06 DIAGNOSIS — W19XXXA Unspecified fall, initial encounter: Secondary | ICD-10-CM | POA: Diagnosis not present

## 2021-09-10 ENCOUNTER — Other Ambulatory Visit (HOSPITAL_COMMUNITY): Payer: Self-pay | Admitting: Physician Assistant

## 2021-09-11 ENCOUNTER — Telehealth: Payer: Self-pay | Admitting: Cardiovascular Disease

## 2021-09-11 ENCOUNTER — Other Ambulatory Visit: Payer: Self-pay

## 2021-09-11 ENCOUNTER — Other Ambulatory Visit (HOSPITAL_COMMUNITY): Payer: Self-pay | Admitting: *Deleted

## 2021-09-11 DIAGNOSIS — Z7901 Long term (current) use of anticoagulants: Secondary | ICD-10-CM | POA: Diagnosis not present

## 2021-09-11 DIAGNOSIS — Z9181 History of falling: Secondary | ICD-10-CM | POA: Diagnosis not present

## 2021-09-11 DIAGNOSIS — S32010D Wedge compression fracture of first lumbar vertebra, subsequent encounter for fracture with routine healing: Secondary | ICD-10-CM | POA: Diagnosis not present

## 2021-09-11 DIAGNOSIS — Z952 Presence of prosthetic heart valve: Secondary | ICD-10-CM | POA: Diagnosis not present

## 2021-09-11 DIAGNOSIS — I351 Nonrheumatic aortic (valve) insufficiency: Secondary | ICD-10-CM | POA: Diagnosis not present

## 2021-09-11 DIAGNOSIS — G43909 Migraine, unspecified, not intractable, without status migrainosus: Secondary | ICD-10-CM | POA: Diagnosis not present

## 2021-09-11 MED ORDER — METOPROLOL TARTRATE 25 MG PO TABS: 12.5000 mg | ORAL_TABLET | Freq: Every day | ORAL | 5 refills | Status: DC

## 2021-09-11 MED ORDER — AMIODARONE HCL 200 MG PO TABS: 100.0000 mg | ORAL_TABLET | Freq: Every day | ORAL | 2 refills | Status: DC

## 2021-09-11 NOTE — Telephone Encounter (Signed)
Spoke with Laveda Abbe PT Parameters for notifying our office about HR is 60-100bpm Would like to change to like 40-50bpm (her HR was low today) Also wanted to make sure our team was aware of "major interaction" between amiodarone & metoprolol tartrate  Advised will send message to MD

## 2021-09-11 NOTE — Telephone Encounter (Signed)
Received a call from [place] the PT who reported patient  had a pulse of 35. Dr. [name] advised and changed metoprolol tartrate 12.5 mg twice a day to 12.5 mg once a day. Advised patient who repeated this back in her own words. She took one dose this morning and will not take any more until tomorrow morning. She will keep her afib appointment if she can. She fell on 1/26 and hurt her back.

## 2021-09-11 NOTE — Telephone Encounter (Signed)
STAT if HR is under 50 or over 120 (normal HR is 60-100 beats per minute)  What is your heart rate? 35  Do you have a log of your heart rate readings (document readings)? No  Do you have any other symptoms? No

## 2021-09-13 DIAGNOSIS — I351 Nonrheumatic aortic (valve) insufficiency: Secondary | ICD-10-CM | POA: Diagnosis not present

## 2021-09-13 DIAGNOSIS — S32010D Wedge compression fracture of first lumbar vertebra, subsequent encounter for fracture with routine healing: Secondary | ICD-10-CM | POA: Diagnosis not present

## 2021-09-13 DIAGNOSIS — Z7901 Long term (current) use of anticoagulants: Secondary | ICD-10-CM | POA: Diagnosis not present

## 2021-09-13 DIAGNOSIS — Z9181 History of falling: Secondary | ICD-10-CM | POA: Diagnosis not present

## 2021-09-13 DIAGNOSIS — G43909 Migraine, unspecified, not intractable, without status migrainosus: Secondary | ICD-10-CM | POA: Diagnosis not present

## 2021-09-13 DIAGNOSIS — Z952 Presence of prosthetic heart valve: Secondary | ICD-10-CM | POA: Diagnosis not present

## 2021-09-13 NOTE — Telephone Encounter (Signed)
[name] [name] was calling back to speak to the nurse.

## 2021-09-13 NOTE — Telephone Encounter (Signed)
Spoke with patient's PT [name] [name] about her HR. He reports HR is better at 51bpm since decrease in metoprolol dose however the parameter's they have for when they need to notify us is less than 60bpm or over 100bpm. He would like to know if Dr. [name] can change parameters so that he does not have to continue calling  Will route to MD

## 2021-09-14 ENCOUNTER — Ambulatory Visit (HOSPITAL_COMMUNITY): Payer: Medicare Other | Admitting: Physician Assistant

## 2021-09-14 DIAGNOSIS — S32010A Wedge compression fracture of first lumbar vertebra, initial encounter for closed fracture: Secondary | ICD-10-CM | POA: Diagnosis not present

## 2021-09-14 DIAGNOSIS — H1032 Unspecified acute conjunctivitis, left eye: Secondary | ICD-10-CM | POA: Diagnosis not present

## 2021-09-14 DIAGNOSIS — Z6820 Body mass index (BMI) 20.0-20.9, adult: Secondary | ICD-10-CM | POA: Diagnosis not present

## 2021-09-14 NOTE — Telephone Encounter (Signed)
Lorretta Harp, MD  You 3 hours ago (6:53 AM)   Change lower to 45, higher to 120

## 2021-09-14 NOTE — Telephone Encounter (Signed)
[name] [name] PT with update on parameters

## 2021-09-17 DIAGNOSIS — Z952 Presence of prosthetic heart valve: Secondary | ICD-10-CM | POA: Diagnosis not present

## 2021-09-17 DIAGNOSIS — G43909 Migraine, unspecified, not intractable, without status migrainosus: Secondary | ICD-10-CM | POA: Diagnosis not present

## 2021-09-17 DIAGNOSIS — I351 Nonrheumatic aortic (valve) insufficiency: Secondary | ICD-10-CM | POA: Diagnosis not present

## 2021-09-17 DIAGNOSIS — Z7901 Long term (current) use of anticoagulants: Secondary | ICD-10-CM | POA: Diagnosis not present

## 2021-09-17 DIAGNOSIS — S32010D Wedge compression fracture of first lumbar vertebra, subsequent encounter for fracture with routine healing: Secondary | ICD-10-CM | POA: Diagnosis not present

## 2021-09-17 DIAGNOSIS — Z9181 History of falling: Secondary | ICD-10-CM | POA: Diagnosis not present

## 2021-09-19 DIAGNOSIS — Z9181 History of falling: Secondary | ICD-10-CM | POA: Diagnosis not present

## 2021-09-19 DIAGNOSIS — S32010D Wedge compression fracture of first lumbar vertebra, subsequent encounter for fracture with routine healing: Secondary | ICD-10-CM | POA: Diagnosis not present

## 2021-09-19 DIAGNOSIS — I351 Nonrheumatic aortic (valve) insufficiency: Secondary | ICD-10-CM | POA: Diagnosis not present

## 2021-09-19 DIAGNOSIS — G43909 Migraine, unspecified, not intractable, without status migrainosus: Secondary | ICD-10-CM | POA: Diagnosis not present

## 2021-09-19 DIAGNOSIS — Z952 Presence of prosthetic heart valve: Secondary | ICD-10-CM | POA: Diagnosis not present

## 2021-09-19 DIAGNOSIS — Z7901 Long term (current) use of anticoagulants: Secondary | ICD-10-CM | POA: Diagnosis not present

## 2021-09-21 ENCOUNTER — Encounter (HOSPITAL_COMMUNITY): Payer: Self-pay | Admitting: Physician Assistant

## 2021-09-21 ENCOUNTER — Ambulatory Visit (HOSPITAL_COMMUNITY)
Admission: RE | Admit: 2021-09-21 | Discharge: 2021-09-21 | Disposition: A | Discharge status: Home / Self Care | Payer: Medicare Other | Source: Ambulatory Visit | Attending: Physician Assistant | Admitting: Physician Assistant

## 2021-09-21 ENCOUNTER — Other Ambulatory Visit: Payer: Self-pay

## 2021-09-21 VITALS — BP 120/80 | HR 53 | Ht 61.0 in | Wt 105.8 lb

## 2021-09-21 DIAGNOSIS — I5032 Chronic diastolic (congestive) heart failure: Secondary | ICD-10-CM | POA: Insufficient documentation

## 2021-09-21 DIAGNOSIS — D6869 Other thrombophilia: Secondary | ICD-10-CM | POA: Diagnosis not present

## 2021-09-21 DIAGNOSIS — I483 Typical atrial flutter: Secondary | ICD-10-CM | POA: Diagnosis not present

## 2021-09-21 DIAGNOSIS — Z7901 Long term (current) use of anticoagulants: Secondary | ICD-10-CM | POA: Insufficient documentation

## 2021-09-21 DIAGNOSIS — I350 Nonrheumatic aortic (valve) stenosis: Secondary | ICD-10-CM | POA: Diagnosis not present

## 2021-09-21 DIAGNOSIS — I4819 Other persistent atrial fibrillation: Secondary | ICD-10-CM | POA: Diagnosis not present

## 2021-09-21 DIAGNOSIS — Z79899 Other long term (current) drug therapy: Secondary | ICD-10-CM | POA: Diagnosis not present

## 2021-09-21 NOTE — Progress Notes (Signed)
Primary Care Physician: [name], MD Primary Cardiologist: Dr [name] Primary Electrophysiologist: none Referring Physician: [facility] triage/Dr [name] [name] is a 81 y.o. female with a history of thoracic aortic aneurysm and AS s/p repair 2004, paroxysmal atrial fibrillation, atrial flutter who presents for follow up in the [facility]. The patient was initially diagnosed with atrial fibrillation in 2004 in the setting of her valve surgery. She had done well since that time with no known recurrence of afib. Patient has a CHADS2VASC score of 3. Patient was in her usual state of health until 05/14/20 when she began having heart "fluttering." These symptoms felt similar to her palpitations after her surgery. ECG 05/15/20 shows afib with RVR. There were no triggers that she could identify. She was started on Eliquis for stroke prevention. Patient is s/p DCCV on 06/05/20.   Patient seen by Dr [name] and symptoms of fluid retention and SOB were felt to be related to her valve disease. She underwent "valve in valve" TAVR on 06/19/21. She states that since the surgery she feels much better with resolution of her SOB. Patient is s/p DCCV on 08/09/21.   On follow up today, patient had a mechanical fall on the stairs and presented to an outside ED and was found to have a fractured vertebrae. Per patient, no surgery is planned and she is working with physical therapy which is helping. Unfortunately, she is back in atrial flutter with symptoms of palpitations. Her metoprolol and amiodarone were recently decreased due to significant bradycardia.   Today, she denies symptoms of palpitations, chest pain, orthopnea, PND, lower extremity edema, dizziness, presyncope, syncope, snoring, daytime somnolence, bleeding, or neurologic sequela. The patient is tolerating medications without difficulties and is otherwise without complaint today.    Atrial Fibrillation Risk  Factors:  she does not have symptoms or diagnosis of sleep apnea. she does not have a history of rheumatic fever. she does not have a history of alcohol use.   she has a BMI of Body mass index is 19.99 kg/m.[name] Filed Weights   09/21/21 1054  Weight: 48 kg      Family History  Problem Relation Age of Onset   Heart attack Father      Atrial Fibrillation Management history:  Previous antiarrhythmic drugs: amiodarone Previous cardioversions: 2013, 06/05/20, 08/09/21 Previous ablations: none CHADS2VASC score: 4 Anticoagulation history: Eliquis   Past Medical History:  Diagnosis Date   Anemia    Aortic root dilatation (HCC) 05/11/2012   CTA CHEST   Arthritis    Cardiac asystole, post DCCV requiring use of temp. external pacemaker leads 07/19/2012   Chronic kidney disease    Stage 4   GERD (gastroesophageal reflux disease)    H/O aortic valve disorder 10/04/2002   Dr. 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 McAlhany & Bartle   Thoracic ascending aortic aneurysm    CTA CHEST 05/11/12   Past Surgical History:  Procedure Laterality Date   ABDOMINAL HYSTERECTOMY     AORTIC VALVE REPLACEMENT  10/04/2002    Dr. 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   INTRAOPERATIVE TRANSTHORACIC ECHOCARDIOGRAM N/A 06/19/2021   Procedure: INTRAOPERATIVE TRANSTHORACIC ECHOCARDIOGRAM;  Surgeon: Burnell Blanks, MD;  Location: [place];  Service: Open Heart Surgery;  Laterality: N/A;   LEFT AND RIGHT HEART CATHETERIZATION WITH CORONARY ANGIOGRAM N/A 06/18/2012   Procedure: LEFT AND RIGHT HEART CATHETERIZATION WITH CORONARY ANGIOGRAM;  Surgeon: Lorretta Harp, MD;  Location: [facility] C[facility] LAB;  Service: Cardiovascular;  Laterality: N/A;   NM MYOCAR PERF WALL MOTION  10/29/2010   normal   REPLACEMENT ASCENDING AORTA  07/07/2012   Procedure: REPLACEMENT ASCENDING AORTA;  Surgeon: Ivin Poot, MD;  Location: [place];  Service: Open Heart Surgery;  Laterality: N/A;  CIRCULATORY ARREST   RIGHT HEART CATH AND CORONARY ANGIOGRAPHY N/A 04/19/2021   Procedure: RIGHT HEART CATH AND CORONARY ANGIOGRAPHY;  Surgeon: Burnell Blanks, MD;  Location: [place] CV LAB;  Service: Cardiovascular;  Laterality: N/A;   TEE WITHOUT CARDIOVERSION  06/18/2012   Procedure: TRANSESOPHAGEAL ECHOCARDIOGRAM (TEE);  Surgeon: Sanda Klein, MD;  Location: [facility] ENDOSCOPY;  Service: Cardiovascular;  Laterality: N/A;  right and left heart cath after this procedure   TEE WITHOUT CARDIOVERSION  07/15/2012   Procedure: TRANSESOPHAGEAL ECHOCARDIOGRAM (TEE);  Surgeon: Pixie Casino, MD;  Location: [facility] ENDOSCOPY;  Service: Cardiovascular;  Laterality: N/A;   TEE WITHOUT CARDIOVERSION N/A 03/27/2021   Procedure: TRANSESOPHAGEAL ECHOCARDIOGRAM (TEE);  Surgeon: Sanda Klein, MD;  Location: [place];  Service: Cardiovascular;  Laterality: N/A;   TONSILLECTOMY     TRANSCATHETER AORTIC VALVE REPLACEMENT, TRANSFEMORAL N/A 06/19/2021   Procedure: TRANSCATHETER AORTIC VALVE REPLACEMENT, TRANSFEMORAL USING A 23MM EDWARDS VALVE;  Surgeon: Burnell Blanks, MD;  Location: [place];   Service: Open Heart Surgery;  Laterality: N/A;    Current Outpatient Medications  Medication Sig Dispense Refill   albuterol (VENTOLIN HFA) 108 (90 Base) MCG/ACT inhaler Inhale 2 puffs into the lungs every 6 (six) hours as needed for wheezing or shortness of breath.     amiodarone (PACERONE) 200 MG tablet Take 0.5 tablets (100 mg total) by mouth daily. 45 tablet 2   amoxicillin (AMOXIL) 500 MG tablet Take 500 mg by mouth. Pt will need Amoxicillin 2 grams prior to dental procedures     apixaban (ELIQUIS) 2.5 MG TABS tablet Take 1 tablet (2.5 mg total) by mouth 2 (two) times daily. 42 tablet 0   azelastine (ASTELIN) 0.1 % nasal spray Place 2 sprays into both nostrils daily as needed for allergies.     b complex vitamins capsule Take 1 capsule by mouth daily.     Calcium Carbonate-Vit D-Min (CALTRATE MINIS PLUS MINERALS PO) Take 40 mcg by mouth every morning.     cholecalciferol (VITAMIN D) 25 MCG (1000 UNIT) tablet Take 1,000 Units by mouth every evening.     diclofenac Sodium (VOLTAREN) 1 % GEL SMARTSIG:1-2 Gram(s) Topical 4 Times Daily PRN     erythromycin ophthalmic ointment SMARTSIG:1 Inch(es) Left Eye Twice Daily     FARXIGA 10  MG TABS tablet Take 10 mg by mouth daily.     ipratropium (ATROVENT) 0.06 % nasal spray 2 sprays in each nostril every 6 hours if needed (Patient taking differently: 2 sprays every 6 (six) hours as needed for rhinitis.) 15 mL 5   ipratropium-albuterol (DUONEB) 0.5-2.5 (3) MG/3ML SOLN Inhale 3 mLs into the lungs every 6 (six) hours as needed (shortness of breath or wheezing).     metoprolol tartrate (LOPRESSOR) 25 MG tablet Take 0.5 tablets (12.5 mg total) by mouth daily. 30 tablet 5   multivitamin-lutein (OCUVITE-LUTEIN) CAPS capsule Take 1 capsule by mouth daily.     ondansetron (ZOFRAN) 4 MG tablet Take 4 mg by mouth every 8 (eight) hours as needed for nausea or vomiting.     pantoprazole (PROTONIX) 40 MG tablet TAKE ONE TABLET BY MOUTH ONCE DAILY BEFORE meal of  choice 90 tablet 2   Polyethyl Glycol-Propyl Glycol (SYSTANE) 0.4-0.3 % SOLN Place 1 drop into both eyes 2 (two) times daily as needed (dry eyes).     pravastatin (PRAVACHOL) 10 MG tablet Take 10 mg by mouth every evening.     SUMAtriptan (IMITREX) 100 MG tablet Take 100 mg by mouth every 2 (two) hours as needed for migraine. May repeat in 2 hours if headache persists or recurs.     torsemide (DEMADEX) 10 MG tablet TAKE 2 TABLETS BY MOUTH EVERY MORNING. 180 tablet 2   No current facility-administered medications for this encounter.    Allergies  Allergen Reactions   Other Nausea And Vomiting    Headache also  Also from artificial sweeetners   Codeine Nausea And Vomiting    Social History   Socioeconomic History   Marital status: Widowed    Spouse name: Not on file   Number of children: 1   Years of education: Not on file   Highest education level: Not on file  Occupational History   Occupation: Retired-realtor  Tobacco Use   Smoking status: Never   Smokeless tobacco: Never  Vaping Use   Vaping Use: Never used  Substance and Sexual Activity   Alcohol use: No   Drug use: No   Sexual activity: Not Currently  Other Topics Concern   Not on file  Social History Narrative   Not on file   Social Determinants of Health   Financial Resource Strain: Not on file  Food Insecurity: Not on file  Transportation Needs: Not on file  Physical Activity: Not on file  Stress: Not on file  Social Connections: Not on file  Intimate Partner Violence: Not on file     ROS- All systems are reviewed and negative except as per the HPI above.  Physical Exam: Vitals:   09/21/21 1054  BP: 120/80  Pulse: (!) 53  Weight: 48 kg  Height: 5\' 1"  (1.549 m)    GEN- The patient is a well appearing elderly female, alert and oriented x 3 today.   HEENT-head normocephalic, atraumatic, sclera clear, conjunctiva pink, hearing intact, trachea midline. Lungs- Clear to ausculation bilaterally, normal  work of breathing Heart- irregular rate and rhythm, no murmurs, rubs or gallops  GI- soft, NT, ND, + BS Extremities- no clubbing, cyanosis, or edema MS- no significant deformity or atrophy Skin- no rash or lesion Psych- euthymic mood, full affect Neuro- strength and sensation are intact   Wt Readings from Last 3 Encounters:  09/21/21 48 kg  08/17/21 50.1 kg  08/09/21 49.9 kg    EKG today demonstrates  Typical atrial flutter  with variable block Vent. rate 53 BPM PR interval * ms QRS duration 102 ms QT/QTcB 452/424 ms  Echo 07/25/21 demonstrated   1. Left ventricular ejection fraction, by estimation, is 55 to 60%. The  left ventricle has normal function. The left ventricle has no regional  wall motion abnormalities. There is moderate concentric left ventricular  hypertrophy. Left ventricular diastolic parameters are indeterminate.   2. Right ventricular systolic function is low normal. The right  ventricular size is normal. There is normal pulmonary artery systolic  pressure.   3. Left atrial size was moderately dilated.   4. Right atrial size was mildly dilated.   5. The mitral valve is normal in structure. Trivial mitral valve  regurgitation. No evidence of mitral stenosis.   6. The aortic valve has been repaired/replaced. Aortic valve  regurgitation is not visualized. There is a 23 mm Sapien prosthetic (TAVR) valve present in the aortic position. Procedure Date: 06/19/2021. Echo findings are consistent with normal structure  and function of the aortic valve prosthesis. Aortic valve area, by VTI  measures 1.33 cm. Aortic valve mean gradient measures 11.0 mmHg. Aortic valve Vmax measures 2.15 m/s.   7. The inferior vena cava is normal in size with greater than 50%  respiratory variability, suggesting right atrial pressure of 3 mmHg.   Comparison(s): No significant change from prior study. Prior images  reviewed side by side.   Conclusion(s)/Recommendation(s): S/P valve in  valve TAVR. Gradients improved from prior study.   Epic records are reviewed at length today  CHA2DS2-VASc Score = 4  The patient's score is based upon: CHF History: 1 HTN History: 0 Diabetes History: 0 Stroke History: 0 Vascular Disease History: 0 Age Score: 2 Gender Score: 1        ASSESSMENT AND PLAN: 1. Persistent Atrial Fibrillation/typical atrial flutter The patient's CHA2DS2-VASc score is 4, indicating a 4.8% annual risk of stroke.   Patient back in atrial flutter today. Therapeutic options are limited with her renal function and significant bradycardia. Will refer her to EP to discuss options. Unsure if she would be a candidate for flutter ablation with age and comorbidities. Ultimately, rate control may be her best option. Continue amiodarone 100 mg daily Continue Eliquis 2.5 mg BID (age, weight) Continue Lopressor 12.5 mg daily  2. Secondary Hypercoagulable State (ICD10:  D68.69) The patient is at significant risk for stroke/thromboembolism based upon her CHA2DS2-VASc Score of 4.  Continue Apixaban (Eliquis).   3. Severe AS S/p valve in valve TAVR 06/19/21  4. Chronic diastolic CHF Appears euvolemic today.   Follow up with EP. Dr [name] as scheduled.    [facility] [address] [place], [place] [address] [phone] 09/21/2021 11:01 AM

## 2021-09-24 ENCOUNTER — Institutional Professional Consult (permissible substitution): Payer: Medicare Other | Admitting: Internal Medicine

## 2021-09-25 DIAGNOSIS — Z9181 History of falling: Secondary | ICD-10-CM | POA: Diagnosis not present

## 2021-09-25 DIAGNOSIS — S32010D Wedge compression fracture of first lumbar vertebra, subsequent encounter for fracture with routine healing: Secondary | ICD-10-CM | POA: Diagnosis not present

## 2021-09-25 DIAGNOSIS — G43909 Migraine, unspecified, not intractable, without status migrainosus: Secondary | ICD-10-CM | POA: Diagnosis not present

## 2021-09-25 DIAGNOSIS — R197 Diarrhea, unspecified: Secondary | ICD-10-CM | POA: Diagnosis not present

## 2021-09-25 DIAGNOSIS — Z7901 Long term (current) use of anticoagulants: Secondary | ICD-10-CM | POA: Diagnosis not present

## 2021-09-25 DIAGNOSIS — Z952 Presence of prosthetic heart valve: Secondary | ICD-10-CM | POA: Diagnosis not present

## 2021-09-25 DIAGNOSIS — M810 Age-related osteoporosis without current pathological fracture: Secondary | ICD-10-CM | POA: Diagnosis not present

## 2021-09-25 DIAGNOSIS — I351 Nonrheumatic aortic (valve) insufficiency: Secondary | ICD-10-CM | POA: Diagnosis not present

## 2021-09-25 DIAGNOSIS — S32010A Wedge compression fracture of first lumbar vertebra, initial encounter for closed fracture: Secondary | ICD-10-CM | POA: Diagnosis not present

## 2021-09-25 DIAGNOSIS — N184 Chronic kidney disease, stage 4 (severe): Secondary | ICD-10-CM | POA: Diagnosis not present

## 2021-09-27 DIAGNOSIS — I351 Nonrheumatic aortic (valve) insufficiency: Secondary | ICD-10-CM | POA: Diagnosis not present

## 2021-09-27 DIAGNOSIS — Z7901 Long term (current) use of anticoagulants: Secondary | ICD-10-CM | POA: Diagnosis not present

## 2021-09-27 DIAGNOSIS — G43909 Migraine, unspecified, not intractable, without status migrainosus: Secondary | ICD-10-CM | POA: Diagnosis not present

## 2021-09-27 DIAGNOSIS — Z9181 History of falling: Secondary | ICD-10-CM | POA: Diagnosis not present

## 2021-09-27 DIAGNOSIS — S32010D Wedge compression fracture of first lumbar vertebra, subsequent encounter for fracture with routine healing: Secondary | ICD-10-CM | POA: Diagnosis not present

## 2021-09-27 DIAGNOSIS — Z952 Presence of prosthetic heart valve: Secondary | ICD-10-CM | POA: Diagnosis not present

## 2021-10-01 DIAGNOSIS — G43909 Migraine, unspecified, not intractable, without status migrainosus: Secondary | ICD-10-CM | POA: Diagnosis not present

## 2021-10-01 DIAGNOSIS — Z7901 Long term (current) use of anticoagulants: Secondary | ICD-10-CM | POA: Diagnosis not present

## 2021-10-01 DIAGNOSIS — Z952 Presence of prosthetic heart valve: Secondary | ICD-10-CM | POA: Diagnosis not present

## 2021-10-01 DIAGNOSIS — I351 Nonrheumatic aortic (valve) insufficiency: Secondary | ICD-10-CM | POA: Diagnosis not present

## 2021-10-01 DIAGNOSIS — Z9181 History of falling: Secondary | ICD-10-CM | POA: Diagnosis not present

## 2021-10-01 DIAGNOSIS — S32010D Wedge compression fracture of first lumbar vertebra, subsequent encounter for fracture with routine healing: Secondary | ICD-10-CM | POA: Diagnosis not present

## 2021-10-02 DIAGNOSIS — Z9181 History of falling: Secondary | ICD-10-CM | POA: Diagnosis not present

## 2021-10-02 DIAGNOSIS — G43909 Migraine, unspecified, not intractable, without status migrainosus: Secondary | ICD-10-CM | POA: Diagnosis not present

## 2021-10-02 DIAGNOSIS — S32010D Wedge compression fracture of first lumbar vertebra, subsequent encounter for fracture with routine healing: Secondary | ICD-10-CM | POA: Diagnosis not present

## 2021-10-02 DIAGNOSIS — I351 Nonrheumatic aortic (valve) insufficiency: Secondary | ICD-10-CM | POA: Diagnosis not present

## 2021-10-02 DIAGNOSIS — Z7901 Long term (current) use of anticoagulants: Secondary | ICD-10-CM | POA: Diagnosis not present

## 2021-10-02 DIAGNOSIS — Z952 Presence of prosthetic heart valve: Secondary | ICD-10-CM | POA: Diagnosis not present

## 2021-10-04 DIAGNOSIS — Z9181 History of falling: Secondary | ICD-10-CM | POA: Diagnosis not present

## 2021-10-04 DIAGNOSIS — Z952 Presence of prosthetic heart valve: Secondary | ICD-10-CM | POA: Diagnosis not present

## 2021-10-04 DIAGNOSIS — G43909 Migraine, unspecified, not intractable, without status migrainosus: Secondary | ICD-10-CM | POA: Diagnosis not present

## 2021-10-04 DIAGNOSIS — Z7901 Long term (current) use of anticoagulants: Secondary | ICD-10-CM | POA: Diagnosis not present

## 2021-10-04 DIAGNOSIS — I351 Nonrheumatic aortic (valve) insufficiency: Secondary | ICD-10-CM | POA: Diagnosis not present

## 2021-10-04 DIAGNOSIS — S32010D Wedge compression fracture of first lumbar vertebra, subsequent encounter for fracture with routine healing: Secondary | ICD-10-CM | POA: Diagnosis not present

## 2021-10-05 DIAGNOSIS — Z7901 Long term (current) use of anticoagulants: Secondary | ICD-10-CM | POA: Diagnosis not present

## 2021-10-05 DIAGNOSIS — Z9181 History of falling: Secondary | ICD-10-CM | POA: Diagnosis not present

## 2021-10-05 DIAGNOSIS — Z952 Presence of prosthetic heart valve: Secondary | ICD-10-CM | POA: Diagnosis not present

## 2021-10-05 DIAGNOSIS — I351 Nonrheumatic aortic (valve) insufficiency: Secondary | ICD-10-CM | POA: Diagnosis not present

## 2021-10-05 DIAGNOSIS — G43909 Migraine, unspecified, not intractable, without status migrainosus: Secondary | ICD-10-CM | POA: Diagnosis not present

## 2021-10-05 DIAGNOSIS — S32010D Wedge compression fracture of first lumbar vertebra, subsequent encounter for fracture with routine healing: Secondary | ICD-10-CM | POA: Diagnosis not present

## 2021-10-08 ENCOUNTER — Telehealth (HOSPITAL_COMMUNITY): Payer: Self-pay | Admitting: *Deleted

## 2021-10-08 DIAGNOSIS — S32010D Wedge compression fracture of first lumbar vertebra, subsequent encounter for fracture with routine healing: Secondary | ICD-10-CM | POA: Diagnosis not present

## 2021-10-08 DIAGNOSIS — Z9181 History of falling: Secondary | ICD-10-CM | POA: Diagnosis not present

## 2021-10-08 DIAGNOSIS — Z952 Presence of prosthetic heart valve: Secondary | ICD-10-CM | POA: Diagnosis not present

## 2021-10-08 DIAGNOSIS — I351 Nonrheumatic aortic (valve) insufficiency: Secondary | ICD-10-CM | POA: Diagnosis not present

## 2021-10-08 DIAGNOSIS — Z7901 Long term (current) use of anticoagulants: Secondary | ICD-10-CM | POA: Diagnosis not present

## 2021-10-08 DIAGNOSIS — G43909 Migraine, unspecified, not intractable, without status migrainosus: Secondary | ICD-10-CM | POA: Diagnosis not present

## 2021-10-08 NOTE — Telephone Encounter (Signed)
Patient approved for patient assistance for Eliquis through 90/30/09 Application Number is [id number] ?

## 2021-10-09 ENCOUNTER — Other Ambulatory Visit: Payer: Self-pay | Admitting: Cardiovascular Disease

## 2021-10-09 DIAGNOSIS — G43909 Migraine, unspecified, not intractable, without status migrainosus: Secondary | ICD-10-CM | POA: Diagnosis not present

## 2021-10-09 DIAGNOSIS — I351 Nonrheumatic aortic (valve) insufficiency: Secondary | ICD-10-CM | POA: Diagnosis not present

## 2021-10-09 DIAGNOSIS — S32010D Wedge compression fracture of first lumbar vertebra, subsequent encounter for fracture with routine healing: Secondary | ICD-10-CM | POA: Diagnosis not present

## 2021-10-09 DIAGNOSIS — Z952 Presence of prosthetic heart valve: Secondary | ICD-10-CM | POA: Diagnosis not present

## 2021-10-09 DIAGNOSIS — Z9181 History of falling: Secondary | ICD-10-CM | POA: Diagnosis not present

## 2021-10-09 DIAGNOSIS — Z7901 Long term (current) use of anticoagulants: Secondary | ICD-10-CM | POA: Diagnosis not present

## 2021-10-09 NOTE — Telephone Encounter (Signed)
Prescription refill request for Eliquis received. ?Indication: Afib  ?Last office visit: 09/21/21 Marlene Lard)  ?Scr: 2.53 (08/09/21) ?Age: 81 ?Weight: 48kg ? ?Appropriate dose and refill sent to requested pharmacy.  ?

## 2021-10-11 DIAGNOSIS — S32010D Wedge compression fracture of first lumbar vertebra, subsequent encounter for fracture with routine healing: Secondary | ICD-10-CM | POA: Diagnosis not present

## 2021-10-11 DIAGNOSIS — Z9181 History of falling: Secondary | ICD-10-CM | POA: Diagnosis not present

## 2021-10-11 DIAGNOSIS — G43909 Migraine, unspecified, not intractable, without status migrainosus: Secondary | ICD-10-CM | POA: Diagnosis not present

## 2021-10-11 DIAGNOSIS — Z952 Presence of prosthetic heart valve: Secondary | ICD-10-CM | POA: Diagnosis not present

## 2021-10-11 DIAGNOSIS — I351 Nonrheumatic aortic (valve) insufficiency: Secondary | ICD-10-CM | POA: Diagnosis not present

## 2021-10-11 DIAGNOSIS — Z7901 Long term (current) use of anticoagulants: Secondary | ICD-10-CM | POA: Diagnosis not present

## 2021-10-16 DIAGNOSIS — I351 Nonrheumatic aortic (valve) insufficiency: Secondary | ICD-10-CM | POA: Diagnosis not present

## 2021-10-16 DIAGNOSIS — Z952 Presence of prosthetic heart valve: Secondary | ICD-10-CM | POA: Diagnosis not present

## 2021-10-16 DIAGNOSIS — G43909 Migraine, unspecified, not intractable, without status migrainosus: Secondary | ICD-10-CM | POA: Diagnosis not present

## 2021-10-16 DIAGNOSIS — Z7901 Long term (current) use of anticoagulants: Secondary | ICD-10-CM | POA: Diagnosis not present

## 2021-10-16 DIAGNOSIS — S32010D Wedge compression fracture of first lumbar vertebra, subsequent encounter for fracture with routine healing: Secondary | ICD-10-CM | POA: Diagnosis not present

## 2021-10-16 DIAGNOSIS — Z9181 History of falling: Secondary | ICD-10-CM | POA: Diagnosis not present

## 2021-10-23 DIAGNOSIS — I351 Nonrheumatic aortic (valve) insufficiency: Secondary | ICD-10-CM | POA: Diagnosis not present

## 2021-10-23 DIAGNOSIS — Z7901 Long term (current) use of anticoagulants: Secondary | ICD-10-CM | POA: Diagnosis not present

## 2021-10-23 DIAGNOSIS — S32010D Wedge compression fracture of first lumbar vertebra, subsequent encounter for fracture with routine healing: Secondary | ICD-10-CM | POA: Diagnosis not present

## 2021-10-23 DIAGNOSIS — G43909 Migraine, unspecified, not intractable, without status migrainosus: Secondary | ICD-10-CM | POA: Diagnosis not present

## 2021-10-23 DIAGNOSIS — Z952 Presence of prosthetic heart valve: Secondary | ICD-10-CM | POA: Diagnosis not present

## 2021-10-23 DIAGNOSIS — Z9181 History of falling: Secondary | ICD-10-CM | POA: Diagnosis not present

## 2021-10-23 DIAGNOSIS — N184 Chronic kidney disease, stage 4 (severe): Secondary | ICD-10-CM | POA: Diagnosis not present

## 2021-10-31 DIAGNOSIS — E875 Hyperkalemia: Secondary | ICD-10-CM | POA: Diagnosis not present

## 2021-10-31 DIAGNOSIS — N184 Chronic kidney disease, stage 4 (severe): Secondary | ICD-10-CM | POA: Diagnosis not present

## 2021-10-31 DIAGNOSIS — I350 Nonrheumatic aortic (valve) stenosis: Secondary | ICD-10-CM | POA: Diagnosis not present

## 2021-10-31 DIAGNOSIS — N2581 Secondary hyperparathyroidism of renal origin: Secondary | ICD-10-CM | POA: Diagnosis not present

## 2021-10-31 DIAGNOSIS — D631 Anemia in chronic kidney disease: Secondary | ICD-10-CM | POA: Diagnosis not present

## 2021-10-31 DIAGNOSIS — I129 Hypertensive chronic kidney disease with stage 1 through stage 4 chronic kidney disease, or unspecified chronic kidney disease: Secondary | ICD-10-CM | POA: Diagnosis not present

## 2021-11-01 DIAGNOSIS — S32010D Wedge compression fracture of first lumbar vertebra, subsequent encounter for fracture with routine healing: Secondary | ICD-10-CM | POA: Diagnosis not present

## 2021-11-01 DIAGNOSIS — G43909 Migraine, unspecified, not intractable, without status migrainosus: Secondary | ICD-10-CM | POA: Diagnosis not present

## 2021-11-01 DIAGNOSIS — Z952 Presence of prosthetic heart valve: Secondary | ICD-10-CM | POA: Diagnosis not present

## 2021-11-01 DIAGNOSIS — I351 Nonrheumatic aortic (valve) insufficiency: Secondary | ICD-10-CM | POA: Diagnosis not present

## 2021-11-01 DIAGNOSIS — Z7901 Long term (current) use of anticoagulants: Secondary | ICD-10-CM | POA: Diagnosis not present

## 2021-11-01 DIAGNOSIS — Z9181 History of falling: Secondary | ICD-10-CM | POA: Diagnosis not present

## 2021-11-02 DIAGNOSIS — E785 Hyperlipidemia, unspecified: Secondary | ICD-10-CM | POA: Diagnosis not present

## 2021-11-02 DIAGNOSIS — I10 Essential (primary) hypertension: Secondary | ICD-10-CM | POA: Diagnosis not present

## 2021-11-06 ENCOUNTER — Encounter: Payer: Self-pay | Admitting: Internal Medicine

## 2021-11-06 ENCOUNTER — Ambulatory Visit (INDEPENDENT_AMBULATORY_CARE_PROVIDER_SITE_OTHER): Payer: Medicare Other | Admitting: Internal Medicine

## 2021-11-06 VITALS — BP 104/60 | HR 52 | Ht 61.0 in | Wt 106.0 lb

## 2021-11-06 DIAGNOSIS — I4819 Other persistent atrial fibrillation: Secondary | ICD-10-CM

## 2021-11-06 DIAGNOSIS — I5032 Chronic diastolic (congestive) heart failure: Secondary | ICD-10-CM | POA: Insufficient documentation

## 2021-11-06 DIAGNOSIS — I483 Typical atrial flutter: Secondary | ICD-10-CM | POA: Diagnosis not present

## 2021-11-06 NOTE — Progress Notes (Signed)
? ? ? ? ?ELECTROPHYSIOLOGY CONSULT NOTE  ?Patient ID: [name] [name], MRN: [id number], DOB/AGE: 09/01/1940 81 y.o. ?Admit date: (Not on file) ?Date of Consult: 11/06/2021 ? ?Primary Physician: [name], MD ?Primary Cardiologist: [facility] ?  ?  ?[name] [name] is a 81 y.o. female who is being seen today for the evaluation of atrial flutter at the request of Dr Elder Love.  ? ? ?HPI ?[name] [name] is a 81 y.o. female seen for recurrent atrial flutter.  This has been identified dating back to 2013.  Associate with tachypalpitations lightheadedness and shortness of breath.  I it has continued to recur having been cardioverted 11/21 and 1/23.  Is less problematic now as the heart rates are slower as she has been treated with amiodarone and metoprolol.   ? ?History of TAVR 11/22 valve in valve and following that and prior to her recurrent atrial flutter, dyspnea with minimal and there was no lightheadedness. ? ?She has been found to be back in atrial flutter.  Much less symptomatic, rates are in the 50s, her metoprolol dose and her amiodarone dose were recently decreased because of this. ?On Eliquis no significant bleeding ? ? ?DATE TEST EF   ?8/22 TEE  60-65 % AI mod-severe ?AS mild Mean Grad 13  ?11/22 Echo   AI mod  ?AS Grad 10  ? ?Date Cr K Hgb  ?11/22 1.89 3.81   ?1/23 2.7 4.9 <<>>7.0 ?? 12.9  ? ? ?Thromboembolic risk factors ( age  -2, Gender-1 ) for a CHADSVASc Score of >=3 ? ? ?Past Medical History:  ?Diagnosis Date  ? Anemia   ? Aortic root dilatation ([place]) 05/11/2012  ? CTA CHEST  ? Arthritis   ? Cardiac asystole, post DCCV requiring use of temp. external pacemaker leads 07/19/2012  ? Chronic kidney disease   ? Stage 4  ? GERD (gastroesophageal reflux disease)   ? H/O aortic valve disorder 10/04/2002  ? Dr. Tharon Aquas Trigt  ? History of migraine headaches   ? Hyperlipidemia   ? on statin therapy  ? Migraines   ? PAF (paroxysmal atrial fibrillation) ([place])   ? Prosthetic valve dysfunction   ? S/P valve in  valve TAVR (transcatheter aortic valve replacement) 06/19/2021  ? s/p valve in valve TAVR with a 47m Edwards S3UR via the TF approach via the TF approach by Drs. McAlhany & Bartle  ? [place]   ? CTA CHEST 05/11/12  ?   ? ?Surgical History:  ?Past Surgical History:  ?Procedure Laterality Date  ? ABDOMINAL HYSTERECTOMY    ? AORTIC VALVE REPLACEMENT  10/04/2002   ? Dr. PTharon AquasTrigt    #21 pericardial stented valve  ? CARDIOVERSION  07/15/2012  ? Procedure: CARDIOVERSION;  Surgeon: KPixie Casino MD;  [place]  Service: Cardiovascular;  Laterality: N/A;  ? CARDIOVERSION N/A 06/05/2020  ? Procedure: CARDIOVERSION;  Surgeon: TSueanne Margarita MD;  [facility];  Service: Cardiovascular;  Laterality: N/A;  ? CARDIOVERSION N/A 08/09/2021  ? Procedure: CARDIOVERSION;  Surgeon: SJerline Pain MD;  [facility];  Service: Cardiovascular;  Laterality: N/A;  ? EYE SURGERY    ? 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 INTRAOPERATIVE TRANSTHORACIC ECHOCARDIOGRAM N/A 06/19/2021  ? Procedure: INTRAOPERATIVE TRANSTHORACIC ECHOCARDIOGRAM;  Surgeon: MBurnell Blanks MD;  [place]  Service: Open Heart Surgery;  Laterality: N/A;  ? LEFT AND RIGHT HEART CATHETERIZATION WITH CORONARY ANGIOGRAM N/A 06/18/2012  ? Procedure: LEFT  AND RIGHT HEART CATHETERIZATION WITH CORONARY ANGIOGRAM;  Surgeon: Lorretta Harp, MD;  Location: [facility] CATH LAB;  Service: Cardiovascular;  Laterality: N/A;  ? NM MYOCAR PERF WALL MOTION  10/29/2010  ? normal  ? REPLACEMENT ASCENDING AORTA  07/07/2012  ? Procedure: REPLACEMENT ASCENDING AORTA;  Surgeon: Ivin Poot, MD;  Location: [place];  Service: Open Heart Surgery;  Laterality: N/A;  CIRCULATORY ARREST  ? RIGHT HEART CATH AND CORONARY ANGIOGRAPHY N/A 04/19/2021  ? Procedure: RIGHT HEART CATH AND CORONARY ANGIOGRAPHY;  Surgeon: Burnell Blanks, MD;  Location: [employer] CV LAB;  Service: Cardiovascular;  Laterality: N/A;  ?  TEE WITHOUT CARDIOVERSION  06/18/2012  ? Procedure: TRANSESOPHAGEAL ECHOCARDIOGRAM (TEE);  Surgeon: Sanda Keidan Aumiller, MD;  [facility] ENDOSCOPY;  Service: Cardiovascular;  Laterality: N/A;  right and left heart cath after this procedure  ? TEE WITHOUT CARDIOVERSION  07/15/2012  ? Procedure: TRANSESOPHAGEAL ECHOCARDIOGRAM (TEE);  Surgeon: Pixie Casino, MD;  Location: [place];  Service: Cardiovascular;  Laterality: N/A;  ? TEE WITHOUT CARDIOVERSION N/A 03/27/2021  ? Procedure: TRANSESOPHAGEAL ECHOCARDIOGRAM (TEE);  Surgeon: Sanda Darline Faith, MD;  Location: [place];  Service: Cardiovascular;  Laterality: N/A;  ? TONSILLECTOMY    ? TRANSCATHETER AORTIC VALVE REPLACEMENT, TRANSFEMORAL N/A 06/19/2021  ? Procedure: TRANSCATHETER AORTIC VALVE REPLACEMENT, TRANSFEMORAL USING A 23MM EDWARDS VALVE;  Surgeon: Burnell Blanks, MD;  Location: [place];  Service: Open Heart Surgery;  Laterality: N/A;  ?  ? ?Home Meds: ?Current Meds  ?Medication Sig  ? albuterol (VENTOLIN HFA) 108 (90 Base) MCG/ACT inhaler Inhale 2 puffs into the lungs every 6 (six) hours as needed for wheezing or shortness of breath.  ? amiodarone (PACERONE) 200 MG tablet Take 0.5 tablets (100 mg total) by mouth daily.  ? amoxicillin (AMOXIL) 500 MG tablet Take 500 mg by mouth. Pt will need Amoxicillin 2 grams prior to dental procedures  ? apixaban (ELIQUIS) 2.5 MG TABS tablet TAKE 1 TABLET BY MOUTH TWICE A DAY  ? azelastine (ASTELIN) 0.1 % nasal spray Place 2 sprays into both nostrils daily as needed for allergies.  ? b complex vitamins capsule Take 1 capsule by mouth daily.  ? Calcium Carbonate-Vit D-Min (CALTRATE MINIS PLUS MINERALS PO) Take 40 mcg by mouth every morning.  ? cholecalciferol (VITAMIN D) 25 MCG (1000 UNIT) tablet Take 1,000 Units by mouth every evening.  ? diclofenac Sodium (VOLTAREN) 1 % GEL SMARTSIG:1-2 Gram(s) Topical 4 Times Daily PRN  ? erythromycin ophthalmic ointment SMARTSIG:1 Inch(es) Left Eye Twice Daily  ? FARXIGA 10  MG TABS tablet Take 10 mg by mouth daily.  ? ipratropium (ATROVENT) 0.06 % nasal spray 2 sprays in each nostril every 6 hours if needed (Patient taking differently: 2 sprays every 6 (six) hours as needed for rhinitis.)  ? ipratropium-albuterol (DUONEB) 0.5-2.5 (3) MG/3ML SOLN Inhale 3 mLs into the lungs every 6 (six) hours as needed (shortness of breath or wheezing).  ? multivitamin-lutein (OCUVITE-LUTEIN) CAPS capsule Take 1 capsule by mouth daily.  ? ondansetron (ZOFRAN) 4 MG tablet Take 4 mg by mouth every 8 (eight) hours as needed for nausea or vomiting.  ? pantoprazole (PROTONIX) 40 MG tablet TAKE ONE TABLET BY MOUTH ONCE DAILY BEFORE meal of choice  ? Polyethyl Glycol-Propyl Glycol (SYSTANE) 0.4-0.3 % SOLN Place 1 drop into both eyes 2 (two) times daily as needed (dry eyes).  ? pravastatin (PRAVACHOL) 10 MG tablet Take 10 mg by mouth every evening.  ? SUMAtriptan (IMITREX) 100 MG tablet Take 100 mg  by mouth every 2 (two) hours as needed for migraine. May repeat in 2 hours if headache persists or recurs.  ? torsemide (DEMADEX) 10 MG tablet Take 10 mg by mouth daily.  ? [DISCONTINUED] metoprolol tartrate (LOPRESSOR) 25 MG tablet Take 0.5 tablets (12.5 mg total) by mouth daily.  ? ? ?Allergies:  ?Allergies  ?Allergen Reactions  ? Other Nausea And Vomiting  ?  Headache also  ?Also from artificial sweeetners  ? Codeine Nausea And Vomiting  ? ? ?Social History  ? ?Socioeconomic History  ? Marital status: Widowed  ?  Spouse name: Not on file  ? Number of children: 1  ? Years of education: Not on file  ? Highest education level: Not on file  ?Occupational History  ? Occupation: Retired-realtor  ?Tobacco Use  ? Smoking status: Never  ? Smokeless tobacco: Never  ?Vaping Use  ? Vaping Use: Never used  ?Substance and Sexual Activity  ? Alcohol use: No  ? Drug use: No  ? Sexual activity: Not Currently  ?Other Topics Concern  ? Not on file  ?Social History Narrative  ? Not on file  ? ?Social Determinants of Health   ? ?Financial Resource Strain: Not on file  ?Food Insecurity: Not on file  ?Transportation Needs: Not on file  ?Physical Activity: Not on file  ?Stress: Not on file  ?Social Connections: Not on file  ?Intimate Part

## 2021-11-06 NOTE — Patient Instructions (Signed)
Medication Instructions:  ?Your physician has recommended you make the following change in your medication:  ? ?** Stop Metoprolol ? ?*If you need a refill on your cardiac medications before your next appointment, please call your pharmacy* ? ? ?Lab Work: ?None ordered. ? ?If you have labs (blood work) drawn today and your tests are completely normal, you will receive your results only by: ?MyChart Message (if you have MyChart) OR ?A paper copy in the mail ?If you have any lab test that is abnormal or we need to change your treatment, we will call you to review the results. ? ? ?Testing/Procedures: ?Your physician has recommended that you have an ablation. Catheter ablation is a medical procedure used to treat some cardiac arrhythmias (irregular heartbeats). During catheter ablation, a long, thin, flexible tube is put into a blood vessel in your groin (upper thigh), or neck. This tube is called an ablation catheter. It is then guided to your heart through the blood vessel. Radio frequency waves destroy small areas of heart tissue where abnormal heartbeats may cause an arrhythmia to start. Please see the instruction sheet given to you today.  ? ? ?Follow-Up: ?At [facility], you and your health needs are our priority.  As part of our continuing mission to provide you with exceptional heart care, we have created designated Provider Care Teams.  These Care Teams include your primary Cardiologist (physician) and Advanced Practice Providers (APPs -  Physician Assistants and Nurse Practitioners) who all work together to provide you with the care you need, when you need it. ? ?We recommend signing up for the patient portal called "MyChart".  Sign up information is provided on this After Visit Summary.  MyChart is used to connect with patients for Virtual Visits (Telemedicine).  Patients are able to view lab/test results, encounter notes, upcoming appointments, etc.  Non-urgent messages can be sent to your provider as  well.   ?To learn more about what you can do with MyChart, go to [email or web address].   ? ?Your next appointment:  To be scheduled :1}  ? ? ?Other Instructions - Please call [phone] or send a MyChart message to Dr [name] if you decide to move forward with the ablation ? ?Aflutter Ablation dates available as of today: ? ?Monday January 07, 2022 ?Wednesday, June 14,2023 ?Friday June 23,2023 ?Monday January 28, 2022 ?  ?

## 2021-11-07 ENCOUNTER — Telehealth: Payer: Self-pay

## 2021-11-07 DIAGNOSIS — Z952 Presence of prosthetic heart valve: Secondary | ICD-10-CM | POA: Diagnosis not present

## 2021-11-07 DIAGNOSIS — S32010D Wedge compression fracture of first lumbar vertebra, subsequent encounter for fracture with routine healing: Secondary | ICD-10-CM | POA: Diagnosis not present

## 2021-11-07 DIAGNOSIS — Z7901 Long term (current) use of anticoagulants: Secondary | ICD-10-CM | POA: Diagnosis not present

## 2021-11-07 DIAGNOSIS — Z9181 History of falling: Secondary | ICD-10-CM | POA: Diagnosis not present

## 2021-11-07 DIAGNOSIS — I351 Nonrheumatic aortic (valve) insufficiency: Secondary | ICD-10-CM | POA: Diagnosis not present

## 2021-11-07 DIAGNOSIS — G43909 Migraine, unspecified, not intractable, without status migrainosus: Secondary | ICD-10-CM | POA: Diagnosis not present

## 2021-11-07 NOTE — Telephone Encounter (Signed)
Spoke with pt who requests her Aflutter Ablation be scheduled for 01/07/2022.  Pt advised once procedure is scheduled will contact re: instructions.  Pt verbalizes understanding and agrees with current plan. ?

## 2021-11-09 ENCOUNTER — Telehealth: Payer: Self-pay

## 2021-11-09 NOTE — Telephone Encounter (Signed)
Left message for Dr. Laveda Abbe at [facility] requesting last office note. ? ?

## 2021-11-09 NOTE — Telephone Encounter (Signed)
Outreach made to Pt. ? ?Call went to VM. ? ?Asked Pt to return call to office. ? ?Need to: ? ? Get BMP ?Determine if Pt has seen kidney doctor ? ?Left message requesting call back. ?

## 2021-11-13 NOTE — Telephone Encounter (Signed)
Spoke with pt and advised of need for last OV note and labs from [facility].  Pt states she will contact their office today and request records be faxed to [phone].  RN thanked pt for assistance in obtaining records. ?

## 2021-11-13 NOTE — Telephone Encounter (Signed)
I spoke with pt on 11/07/21 who states she has had a recent BMET(after her DCCV) by provider at [facility] in which K+ was WNL per pt.  Pt states she will obtain a copy of lab and bring it by the office. ?

## 2021-11-19 ENCOUNTER — Other Ambulatory Visit: Payer: Self-pay | Admitting: Cardiovascular Disease

## 2021-11-22 ENCOUNTER — Telehealth: Payer: Self-pay

## 2021-11-22 DIAGNOSIS — Z01812 Encounter for preprocedural laboratory examination: Secondary | ICD-10-CM

## 2021-11-22 DIAGNOSIS — I480 Paroxysmal atrial fibrillation: Secondary | ICD-10-CM

## 2021-11-22 DIAGNOSIS — I483 Typical atrial flutter: Secondary | ICD-10-CM

## 2021-11-22 DIAGNOSIS — I484 Atypical atrial flutter: Secondary | ICD-10-CM

## 2021-11-22 NOTE — Telephone Encounter (Signed)
Spoke with pt and reviewed Aflutter Ablation Instructions.  See letter for complete details.  Pt verbalizes understanding and agrees with current plan.  ?

## 2021-11-22 NOTE — Telephone Encounter (Signed)
Records have been received and reviewed by Dr [name]. ?

## 2021-12-02 DIAGNOSIS — E785 Hyperlipidemia, unspecified: Secondary | ICD-10-CM | POA: Diagnosis not present

## 2021-12-02 DIAGNOSIS — I10 Essential (primary) hypertension: Secondary | ICD-10-CM | POA: Diagnosis not present

## 2021-12-18 ENCOUNTER — Other Ambulatory Visit: Payer: Self-pay

## 2021-12-18 ENCOUNTER — Telehealth: Payer: Self-pay | Admitting: Cardiovascular Disease

## 2021-12-18 DIAGNOSIS — Z952 Presence of prosthetic heart valve: Secondary | ICD-10-CM

## 2021-12-18 NOTE — Telephone Encounter (Signed)
LMTCB. Echo ordered. ?

## 2021-12-18 NOTE — Telephone Encounter (Signed)
Patient is needing to know if she needs an echo done before her appt with Dr [name] in August. Please advise.  ?

## 2021-12-18 NOTE — Telephone Encounter (Signed)
Patient is requesting an echocardiogram before her august appointment. Should I place the order? ?

## 2021-12-24 DIAGNOSIS — N184 Chronic kidney disease, stage 4 (severe): Secondary | ICD-10-CM | POA: Diagnosis not present

## 2021-12-24 DIAGNOSIS — I4891 Unspecified atrial fibrillation: Secondary | ICD-10-CM | POA: Diagnosis not present

## 2021-12-24 DIAGNOSIS — Z6821 Body mass index (BMI) 21.0-21.9, adult: Secondary | ICD-10-CM | POA: Diagnosis not present

## 2021-12-24 DIAGNOSIS — I951 Orthostatic hypotension: Secondary | ICD-10-CM | POA: Diagnosis not present

## 2022-01-02 DIAGNOSIS — H40013 Open angle with borderline findings, low risk, bilateral: Secondary | ICD-10-CM | POA: Diagnosis not present

## 2022-01-02 DIAGNOSIS — H35383 Toxic maculopathy, bilateral: Secondary | ICD-10-CM | POA: Diagnosis not present

## 2022-01-03 ENCOUNTER — Other Ambulatory Visit: Payer: Medicare Other | Admitting: *Deleted

## 2022-01-03 DIAGNOSIS — I480 Paroxysmal atrial fibrillation: Secondary | ICD-10-CM | POA: Diagnosis not present

## 2022-01-03 DIAGNOSIS — Z01812 Encounter for preprocedural laboratory examination: Secondary | ICD-10-CM

## 2022-01-03 DIAGNOSIS — I483 Typical atrial flutter: Secondary | ICD-10-CM | POA: Diagnosis not present

## 2022-01-03 LAB — BASIC METABOLIC PANEL
BUN/Creatinine Ratio: 12 (ref 12–28)
BUN: 20 mg/dL (ref 8–27)
CO2: 24 mmol/L (ref 20–29)
Calcium: 9.1 mg/dL (ref 8.7–10.3)
Chloride: 104 mmol/L (ref 96–106)
Creatinine, Ser: 1.73 mg/dL — ABNORMAL HIGH (ref 0.57–1.00)
Glucose: 76 mg/dL (ref 70–99)
Potassium: 5 mmol/L (ref 3.5–5.2)
Sodium: 137 mmol/L (ref 134–144)
eGFR: 29 mL/min/{1.73_m2} — ABNORMAL LOW (ref >59)

## 2022-01-03 LAB — CBC
Hematocrit: 33.1 % — ABNORMAL LOW (ref 34.0–46.6)
Hemoglobin: 11.2 g/dL (ref 11.1–15.9)
MCH: 32.1 pg (ref 26.6–33.0)
MCHC: 33.8 g/dL (ref 31.5–35.7)
MCV: 95 fL (ref 79–97)
Platelets: 175 10*3/uL (ref 150–450)
RBC: 3.49 x10E6/uL — ABNORMAL LOW (ref 3.77–5.28)
RDW: 12.6 % (ref 11.7–15.4)
WBC: 6 10*3/uL (ref 3.4–10.8)

## 2022-01-04 NOTE — Pre-Procedure Instructions (Signed)
Attempted to call patient regarding pcoedure instrutstions.  Left voice mail on the following items: Arrival time 0530 Nothing to eat or drink after midnight No meds AM of procedure Responsible person to drive you home and stay with you for 24 hrs  Have you missed any doses of anti-coagulant Eliquis, take Sunday nights dose, none Monday morning.

## 2022-01-07 ENCOUNTER — Ambulatory Visit (HOSPITAL_BASED_OUTPATIENT_CLINIC_OR_DEPARTMENT_OTHER): Payer: Medicare Other | Admitting: Certified Registered Nurse Anesthetist

## 2022-01-07 ENCOUNTER — Ambulatory Visit (HOSPITAL_COMMUNITY)
Admission: RE | Admit: 2022-01-07 | Discharge: 2022-01-07 | Disposition: A | Discharge status: Home / Self Care | Payer: Medicare Other | Source: Ambulatory Visit | Attending: Internal Medicine | Admitting: Internal Medicine

## 2022-01-07 ENCOUNTER — Ambulatory Visit (HOSPITAL_COMMUNITY): Payer: Medicare Other | Admitting: Certified Registered Nurse Anesthetist

## 2022-01-07 ENCOUNTER — Other Ambulatory Visit: Payer: Self-pay

## 2022-01-07 ENCOUNTER — Encounter (HOSPITAL_COMMUNITY)
Admission: RE | Disposition: A | Discharge status: Home / Self Care | Payer: Self-pay | Source: Ambulatory Visit | Attending: Internal Medicine

## 2022-01-07 DIAGNOSIS — Z7901 Long term (current) use of anticoagulants: Secondary | ICD-10-CM | POA: Diagnosis not present

## 2022-01-07 DIAGNOSIS — I4892 Unspecified atrial flutter: Secondary | ICD-10-CM | POA: Insufficient documentation

## 2022-01-07 DIAGNOSIS — K219 Gastro-esophageal reflux disease without esophagitis: Secondary | ICD-10-CM | POA: Diagnosis not present

## 2022-01-07 DIAGNOSIS — R001 Bradycardia, unspecified: Secondary | ICD-10-CM | POA: Diagnosis not present

## 2022-01-07 DIAGNOSIS — J45909 Unspecified asthma, uncomplicated: Secondary | ICD-10-CM

## 2022-01-07 DIAGNOSIS — Z8669 Personal history of other diseases of the nervous system and sense organs: Secondary | ICD-10-CM | POA: Diagnosis not present

## 2022-01-07 DIAGNOSIS — N289 Disorder of kidney and ureter, unspecified: Secondary | ICD-10-CM

## 2022-01-07 DIAGNOSIS — I350 Nonrheumatic aortic (valve) stenosis: Secondary | ICD-10-CM | POA: Diagnosis not present

## 2022-01-07 DIAGNOSIS — Z79899 Other long term (current) drug therapy: Secondary | ICD-10-CM | POA: Insufficient documentation

## 2022-01-07 DIAGNOSIS — Z952 Presence of prosthetic heart valve: Secondary | ICD-10-CM | POA: Insufficient documentation

## 2022-01-07 HISTORY — PX: A-FLUTTER ABLATION: EP1230

## 2022-01-07 SURGERY — A-FLUTTER ABLATION
Anesthesia: General

## 2022-01-07 MED ORDER — EPHEDRINE SULFATE-NACL 50-0.9 MG/10ML-% IV SOSY
PREFILLED_SYRINGE | INTRAVENOUS | Status: DC | PRN
  Administered 2022-01-07: 5 mg via INTRAVENOUS

## 2022-01-07 MED ORDER — APIXABAN 2.5 MG PO TABS
2.5000 mg | ORAL_TABLET | Freq: Once | ORAL | Status: AC
  Administered 2022-01-07: 2.5 mg via ORAL
  Filled 2022-01-07 (×2): qty 1

## 2022-01-07 MED ORDER — PHENYLEPHRINE HCL-NACL 20-0.9 MG/250ML-% IV SOLN
INTRAVENOUS | Status: DC | PRN
  Administered 2022-01-07: 25 ug/min via INTRAVENOUS

## 2022-01-07 MED ORDER — SODIUM CHLORIDE 0.9 % IV SOLN: INTRAVENOUS | Status: DC

## 2022-01-07 MED ORDER — SODIUM CHLORIDE 0.9% FLUSH: 3.0000 mL | Freq: Two times a day (BID) | INTRAVENOUS | Status: DC

## 2022-01-07 MED ORDER — HEPARIN (PORCINE) IN NACL 1000-0.9 UT/500ML-% IV SOLN
INTRAVENOUS | Status: DC | PRN
  Administered 2022-01-07: 500 mL

## 2022-01-07 MED ORDER — ONDANSETRON HCL 4 MG/2ML IJ SOLN: 4.0000 mg | Freq: Four times a day (QID) | INTRAMUSCULAR | Status: DC | PRN

## 2022-01-07 MED ORDER — GLYCOPYRROLATE PF 0.2 MG/ML IJ SOSY
PREFILLED_SYRINGE | INTRAMUSCULAR | Status: DC | PRN
  Administered 2022-01-07: .1 mg via INTRAVENOUS

## 2022-01-07 MED ORDER — ACETAMINOPHEN 325 MG PO TABS: 650.0000 mg | ORAL_TABLET | ORAL | Status: DC | PRN

## 2022-01-07 MED ORDER — LIDOCAINE HCL 1 % IJ SOLN
INTRAMUSCULAR | Status: AC
  Filled 2022-01-07: qty 20

## 2022-01-07 MED ORDER — PROPOFOL 10 MG/ML IV BOLUS
INTRAVENOUS | Status: DC | PRN
  Administered 2022-01-07: 110 mg via INTRAVENOUS

## 2022-01-07 MED ORDER — HEPARIN (PORCINE) IN NACL 1000-0.9 UT/500ML-% IV SOLN
INTRAVENOUS | Status: AC
  Filled 2022-01-07: qty 500

## 2022-01-07 MED ORDER — SODIUM CHLORIDE 0.9% FLUSH: 3.0000 mL | INTRAVENOUS | Status: DC | PRN

## 2022-01-07 MED ORDER — SODIUM CHLORIDE 0.9 % IV SOLN: 250.0000 mL | INTRAVENOUS | Status: DC | PRN

## 2022-01-07 MED ORDER — FENTANYL CITRATE (PF) 250 MCG/5ML IJ SOLN
INTRAMUSCULAR | Status: DC | PRN
  Administered 2022-01-07: 50 ug via INTRAVENOUS

## 2022-01-07 MED ORDER — LIDOCAINE 2% (20 MG/ML) 5 ML SYRINGE
INTRAMUSCULAR | Status: DC | PRN
  Administered 2022-01-07: 80 mg via INTRAVENOUS

## 2022-01-07 MED ORDER — ROCURONIUM BROMIDE 10 MG/ML (PF) SYRINGE
PREFILLED_SYRINGE | INTRAVENOUS | Status: DC | PRN
  Administered 2022-01-07: 50 mg via INTRAVENOUS

## 2022-01-07 MED ORDER — SUGAMMADEX SODIUM 200 MG/2ML IV SOLN
INTRAVENOUS | Status: DC | PRN
  Administered 2022-01-07: 200 mg via INTRAVENOUS

## 2022-01-07 MED ORDER — BUPIVACAINE HCL (PF) 0.25 % IJ SOLN
INTRAMUSCULAR | Status: DC | PRN
  Administered 2022-01-07: 30 mL

## 2022-01-07 MED ORDER — ONDANSETRON HCL 4 MG/2ML IJ SOLN
INTRAMUSCULAR | Status: DC | PRN
  Administered 2022-01-07: 4 mg via INTRAVENOUS

## 2022-01-07 SURGICAL SUPPLY — 11 items
CATH BLAZERPRIME XP LG CV 10MM (ABLATOR) ×1 IMPLANT
CATH DUODECA HALO/ISMUS 7FR (CATHETERS) ×1 IMPLANT
CATH OCTAPOLOR 6F 125CM 2-5-2 (CATHETERS) ×1 IMPLANT
CATH QUAD COURNAND 5FR REPROC (CATHETERS) ×1 IMPLANT
INTRODUCER SWARTZ SL2 8F (SHEATH) ×1 IMPLANT
PACK EP LATEX FREE (CUSTOM PROCEDURE TRAY) ×2
PACK EP LF (CUSTOM PROCEDURE TRAY) ×1 IMPLANT
PAD DEFIB RADIO PHYSIO CONN (PAD) ×2 IMPLANT
SHEATH PINNACLE 6F 10CM (SHEATH) ×1 IMPLANT
SHEATH PINNACLE 7F 10CM (SHEATH) ×1 IMPLANT
SHEATH PINNACLE 8F 10CM (SHEATH) ×2 IMPLANT

## 2022-01-07 NOTE — Anesthesia Preprocedure Evaluation (Addendum)
Anesthesia Evaluation  Patient identified by MRN, date of birth, ID band Patient awake    Reviewed: Allergy & Precautions, NPO status , Patient's Chart, lab work & pertinent test results  Airway Mallampati: II  TM Distance: >3 FB     Dental   Pulmonary asthma ,    breath sounds clear to auscultation       Cardiovascular negative cardio ROS   Rhythm:Regular Rate:Normal     Neuro/Psych Seizures -,     GI/Hepatic Neg liver ROS, GERD  ,  Endo/Other    Renal/GU Renal disease     Musculoskeletal   Abdominal   Peds  Hematology   Anesthesia Other Findings   Reproductive/Obstetrics                             Anesthesia Physical Anesthesia Plan  ASA: 3  Anesthesia Plan: General   Post-op Pain Management:    Induction: Intravenous  PONV Risk Score and Plan: 3  Airway Management Planned: Oral ETT  Additional Equipment:   Intra-op Plan:   Post-operative Plan: Extubation in OR  Informed Consent:     Dental advisory given  Plan Discussed with: CRNA and Anesthesiologist  Anesthesia Plan Comments:        Anesthesia Quick Evaluation

## 2022-01-07 NOTE — Discharge Instructions (Signed)

## 2022-01-07 NOTE — H&P (Signed)
Patient Care Team: [name], MD as PCP - General (Family Medicine) Lorretta Harp, MD as PCP - Cardiology (Cardiology)   HPI  [name] [name] is a 81 y.o. female admitted for ablation of  atrial flutter.   Identified 2013 and associated with tachypalpitations lightheadedness and shortness of breath. Recurring requiring cardioversion 11/21 and 1/23.  Is less problematic now as the heart rates are slower as she has been treated with amiodarone and metoprolol.     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      No palpitations, mild dyspnea        DATE TEST EF    8/22 TEE  60-65 % AI mod-severe AS mild Mean Grad 13  11/22 Echo    AI mod  AS Grad 10 LAE-mod 31 ml/m2    Date Cr K Hgb  11/22 1.89 3.81    1/23 2.7 4.9 <<>>7.0 ?? 12.9  6/23 1.73 5.0 11.2       Thromboembolic risk factors ( age  -2, Gender-1 ) for a CHADSVASc Score of >=3 Records and Results Reviewed   Past Medical History:  Diagnosis Date   Anemia    Aortic root dilatation ([place]) 05/11/2012   CTA CHEST   Arthritis    Cardiac asystole, post DCCV requiring use of temp. external pacemaker leads 07/19/2012   Chronic kidney disease    Stage 4   GERD (gastroesophageal reflux disease)    H/O aortic valve disorder 10/04/2002   Dr. 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 McAlhany & Bartle   Thoracic ascending aortic aneurysm ([facility]    CTA CHEST 05/11/12    Past Surgical History:  Procedure Laterality Date   ABDOMINAL HYSTERECTOMY     AORTIC VALVE REPLACEMENT  10/04/2002    Dr. 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   INTRAOPERATIVE TRANSTHORACIC ECHOCARDIOGRAM N/A 06/19/2021   Procedure: INTRAOPERATIVE TRANSTHORACIC ECHOCARDIOGRAM;  Surgeon: MBurnell Blanks MD;  [facility]  Service: Open Heart Surgery;  Laterality: N/A;   LEFT AND RIGHT HEART CATHETERIZATION WITH CORONARY ANGIOGRAM N/A 06/18/2012   Procedure: LEFT AND RIGHT HEART CATHETERIZATION WITH CORONARY ANGIOGRAM;  Surgeon: JLorretta Harp MD;  [facility]  Service: Cardiovascular;  Laterality: N/A;   NM MYOCAR PERF WALL MOTION  10/29/2010   normal   REPLACEMENT ASCENDING AORTA  07/07/2012   Procedure: REPLACEMENT ASCENDING AORTA;  Surgeon: PIvin Poot MD;  [place] [facility] [place]  Service: Open  Heart Surgery;  Laterality: N/A;  CIRCULATORY ARREST   RIGHT HEART CATH AND CORONARY ANGIOGRAPHY N/A 04/19/2021   Procedure: RIGHT HEART CATH AND CORONARY ANGIOGRAPHY;  Surgeon: Burnell Blanks, MD;  [place] [facility];  Service: Cardiovascular;  Laterality: N/A;   TEE WITHOUT CARDIOVERSION  06/18/2012   Procedure: TRANSESOPHAGEAL ECHOCARDIOGRAM (TEE);  Surgeon: Sanda Janylah Belgrave, MD;  Location: [facility] ENDOSCOPY;  Service: Cardiovascular;  Laterality: N/A;  right and left heart cath after this procedure   TEE WITHOUT CARDIOVERSION  07/15/2012   Procedure: TRANSESOPHAGEAL ECHOCARDIOGRAM (TEE);  Surgeon: Pixie Casino, MD;  Location: [facility] ENDOSCOPY;  Service: Cardiovascular;   Laterality: N/A;   TEE WITHOUT CARDIOVERSION N/A 03/27/2021   Procedure: TRANSESOPHAGEAL ECHOCARDIOGRAM (TEE);  Surgeon: Sanda Molley Houser, MD;  Location: [place];  Service: Cardiovascular;  Laterality: N/A;   TONSILLECTOMY     TRANSCATHETER AORTIC VALVE REPLACEMENT, TRANSFEMORAL N/A 06/19/2021   Procedure: TRANSCATHETER AORTIC VALVE REPLACEMENT, TRANSFEMORAL USING A 23MM EDWARDS VALVE;  Surgeon: Burnell Blanks, MD;  Location: [facility];  Service: Open Heart Surgery;  Laterality: N/A;    Current Facility-Administered Medications  Medication Dose Route Frequency Provider Last Rate Last Admin   0.9 %  sodium chloride infusion   Intravenous Continuous Deboraha Sprang, MD 50 mL/hr at 01/07/22 0545 New Bag at 01/07/22 0545    Allergies  Allergen Reactions   Other Nausea And Vomiting    Headache also   Artificial sweetners   Codeine Nausea And Vomiting      Social History   Tobacco Use   Smoking status: Never   Smokeless tobacco: Never  Vaping Use   Vaping Use: Never used  Substance Use Topics   Alcohol use: No   Drug use: No     Family History  Problem Relation Age of Onset   Heart attack Father      Current Meds  Medication Sig   alendronate (FOSAMAX) 70 MG tablet Take 70 mg by mouth every Thursday.   amiodarone (PACERONE) 200 MG tablet Take 0.5 tablets (100 mg total) by mouth daily.   apixaban (ELIQUIS) 2.5 MG TABS tablet TAKE 1 TABLET BY MOUTH TWICE A DAY   azelastine (ASTELIN) 0.1 % nasal spray Place 2 sprays into both nostrils daily as needed for allergies.   b complex vitamins capsule Take 1 capsule by mouth daily.   Calcium Carbonate-Vit D-Min (CALTRATE MINIS PLUS MINERALS PO) Take 2 tablets by mouth every morning.   cholecalciferol (VITAMIN D) 25 MCG (1000 UNIT) tablet Take 1,000 Units by mouth every evening.   FARXIGA 10 MG TABS tablet Take 10 mg by mouth daily.   ipratropium (ATROVENT) 0.06 % nasal spray 2 sprays in each nostril every 6 hours if needed  (Patient taking differently: 2 sprays every 6 (six) hours as needed for rhinitis.)   multivitamin-lutein (OCUVITE-LUTEIN) CAPS capsule Take 1 capsule by mouth daily.   pantoprazole (PROTONIX) 40 MG tablet TAKE ONE TABLET BY MOUTH ONCE DAILY BEFORE meal of choice   Polyethyl Glycol-Propyl Glycol (SYSTANE) 0.4-0.3 % SOLN Place 1 drop into both eyes 2 (two) times daily as needed (dry eyes).   pravastatin (PRAVACHOL) 10 MG tablet Take 10 mg by mouth every evening.     Review of Systems negative except from HPI and PMH  Physical Exam BP 133/68   Pulse 81   Temp 97.7 F (36.5 C) (Oral)   Resp 17   Ht '5\' 1"'$  (1.549 m)   Wt 49.4 kg   SpO2 94%  BMI 20.60 kg/m  Well developed and well nourished in no acute distress HENT normal E scleral and icterus clear Neck Supple JVP flat; Clear to ausculation Regular rate and rhythm 2/6 murmurs gallops or rub Soft with active bowel sounds No clubbing cyanosis  Edema Alert and oriented, grossly normal motor and sensory function Skin Warm and Dry    Assessment and  Plan Atrial flutter-probably typical-recurrent   Aortic stenosis status post surgical AVR, and then TAVR valve in valve   Amiodarone   Sinus bradycardia-iatrogenic   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   For catheter ablation of her atrial flutter, significant possibility of afib based on age; mod LAE  Reviewed risks and benefits of ablation--including death and vascular injury and failure to achieve block, there is some likelihood, despite ECG that the flutter is not isthmus dependent She voices understanding and is agreeable to proceeding

## 2022-01-07 NOTE — Anesthesia Postprocedure Evaluation (Signed)
Anesthesia Post Note  Patient: [name] [name]  Procedure(s) Performed: A-FLUTTER ABLATION     Patient location during evaluation: Endoscopy Anesthesia Type: General Level of consciousness: awake Pain management: pain level controlled Vital Signs Assessment: post-procedure vital signs reviewed and stable Respiratory status: spontaneous breathing Cardiovascular status: stable Postop Assessment: no apparent nausea or vomiting Anesthetic complications: no   There were no known notable events for this encounter.  Last Vitals:  Vitals:   01/07/22 1005 01/07/22 1006  BP: (!) 102/44 (!) 102/44  Pulse: 73 72  Resp: 19 16  Temp:  36.5 C  SpO2: 95% 95%    Last Pain:  Vitals:   01/07/22 0934  TempSrc: Tympanic  PainSc: 0-No pain                 Sireen Halk

## 2022-01-07 NOTE — Anesthesia Procedure Notes (Signed)
Procedure Name: Intubation Date/Time: 01/07/2022 7:41 AM Performed by: Valda Favia, CRNA Pre-anesthesia Checklist: Patient identified, Emergency Drugs available, Suction available and Patient being monitored Patient Re-evaluated:Patient Re-evaluated prior to induction Oxygen Delivery Method: Circle System Utilized Preoxygenation: Pre-oxygenation with 100% oxygen Induction Type: IV induction Ventilation: Mask ventilation without difficulty Laryngoscope Size: Glidescope and 3 Grade View: Grade I Tube type: Oral Number of attempts: 1 Airway Equipment and Method: Stylet and Oral airway Placement Confirmation: ETT inserted through vocal cords under direct vision, positive ETCO2 and breath sounds checked- equal and bilateral Secured at: 21 cm Tube secured with: Tape Dental Injury: Teeth and Oropharynx as per pre-operative assessment

## 2022-01-07 NOTE — Transfer of Care (Signed)
Immediate Anesthesia Transfer of Care Note  Patient: [name] [name]  Procedure(s) Performed: A-FLUTTER ABLATION  Patient Location: PACU  Anesthesia Type:General  Level of Consciousness: awake, alert  and oriented  Airway & Oxygen Therapy: Patient Spontanous Breathing and Patient connected to nasal cannula oxygen  Post-op Assessment: Report given to RN and Post -op Vital signs reviewed and stable  Post vital signs: Reviewed and stable  Last Vitals:  Vitals Value Taken Time  BP 103/46 01/07/22 0940  Temp 36.6 C 01/07/22 0934  Pulse 73 01/07/22 0944  Resp 13 01/07/22 0944  SpO2 99 % 01/07/22 0944  Vitals shown include unvalidated device data.  Last Pain:  Vitals:   01/07/22 0934  TempSrc: Tympanic  PainSc: 0-No pain         Complications: There were no known notable events for this encounter.

## 2022-01-08 ENCOUNTER — Encounter (HOSPITAL_COMMUNITY): Payer: Self-pay | Admitting: Internal Medicine

## 2022-01-08 ENCOUNTER — Ambulatory Visit: Payer: Medicare Other | Admitting: Cardiovascular Disease

## 2022-01-09 ENCOUNTER — Encounter: Payer: Self-pay | Admitting: Internal Medicine

## 2022-01-09 NOTE — Progress Notes (Unsigned)
Called to followup with patient  and she is doing well

## 2022-01-18 DIAGNOSIS — R42 Dizziness and giddiness: Secondary | ICD-10-CM | POA: Diagnosis not present

## 2022-01-18 DIAGNOSIS — S0285XA Fracture of orbit, unspecified, initial encounter for closed fracture: Secondary | ICD-10-CM | POA: Diagnosis not present

## 2022-01-18 DIAGNOSIS — Z7901 Long term (current) use of anticoagulants: Secondary | ICD-10-CM | POA: Diagnosis not present

## 2022-01-18 DIAGNOSIS — Z043 Encounter for examination and observation following other accident: Secondary | ICD-10-CM | POA: Diagnosis not present

## 2022-01-18 DIAGNOSIS — I4891 Unspecified atrial fibrillation: Secondary | ICD-10-CM | POA: Diagnosis not present

## 2022-01-18 DIAGNOSIS — S0531XA Ocular laceration without prolapse or loss of intraocular tissue, right eye, initial encounter: Secondary | ICD-10-CM | POA: Diagnosis not present

## 2022-01-18 DIAGNOSIS — S0181XA Laceration without foreign body of other part of head, initial encounter: Secondary | ICD-10-CM | POA: Diagnosis not present

## 2022-01-18 DIAGNOSIS — R58 Hemorrhage, not elsewhere classified: Secondary | ICD-10-CM | POA: Diagnosis not present

## 2022-01-18 DIAGNOSIS — S0240FA Zygomatic fracture, left side, initial encounter for closed fracture: Secondary | ICD-10-CM | POA: Diagnosis not present

## 2022-01-29 DIAGNOSIS — W19XXXD Unspecified fall, subsequent encounter: Secondary | ICD-10-CM | POA: Diagnosis not present

## 2022-01-29 DIAGNOSIS — S0240FS Zygomatic fracture, left side, sequela: Secondary | ICD-10-CM | POA: Diagnosis not present

## 2022-01-29 DIAGNOSIS — S0285XA Fracture of orbit, unspecified, initial encounter for closed fracture: Secondary | ICD-10-CM | POA: Diagnosis not present

## 2022-01-29 DIAGNOSIS — S0083XA Contusion of other part of head, initial encounter: Secondary | ICD-10-CM | POA: Diagnosis not present

## 2022-02-04 DIAGNOSIS — Z9889 Other specified postprocedural states: Secondary | ICD-10-CM | POA: Insufficient documentation

## 2022-02-04 DIAGNOSIS — Z8679 Personal history of other diseases of the circulatory system: Secondary | ICD-10-CM | POA: Insufficient documentation

## 2022-02-06 ENCOUNTER — Encounter: Payer: Self-pay | Admitting: Internal Medicine

## 2022-02-06 ENCOUNTER — Ambulatory Visit (INDEPENDENT_AMBULATORY_CARE_PROVIDER_SITE_OTHER): Payer: Medicare Other | Admitting: Internal Medicine

## 2022-02-06 VITALS — BP 122/68 | HR 64 | Ht 61.0 in | Wt 108.0 lb

## 2022-02-06 DIAGNOSIS — I483 Typical atrial flutter: Secondary | ICD-10-CM | POA: Diagnosis not present

## 2022-02-06 DIAGNOSIS — Z8679 Personal history of other diseases of the circulatory system: Secondary | ICD-10-CM | POA: Diagnosis not present

## 2022-02-06 DIAGNOSIS — Z9889 Other specified postprocedural states: Secondary | ICD-10-CM

## 2022-02-06 LAB — CBC
Hematocrit: 33.4 % — ABNORMAL LOW (ref 34.0–46.6)
Hemoglobin: 11.3 g/dL (ref 11.1–15.9)
MCH: 32.2 pg (ref 26.6–33.0)
MCHC: 33.8 g/dL (ref 31.5–35.7)
MCV: 95 fL (ref 79–97)
Platelets: 192 10*3/uL (ref 150–450)
RBC: 3.51 x10E6/uL — ABNORMAL LOW (ref 3.77–5.28)
RDW: 14.1 % (ref 11.7–15.4)
WBC: 5.9 10*3/uL (ref 3.4–10.8)

## 2022-02-06 NOTE — Progress Notes (Signed)
Patient Care Team: [name], MD as PCP - General (Family Medicine) Lorretta Harp, MD as PCP - Cardiology (Cardiology)   HPI  [name] [name] is a 81 y.o. female seen in follow-up for atrial flutter for which she underwent catheter ablation 6/23.  Has been treated with amiodarone and anticoagulated with apixaban.  No recurrent atrial arrhythmias of which she is aware.   She has had a fall recently where she got her feet tangled on the rock and the leaves.  Hit her face.  Imaging was negative for fractures.   DATE TEST EF    8/22 TEE  60-65 % AI mod-severe AS mild Mean Grad 13  11/22 Echo    AI mod  AS Grad 10    Date Cr K Hgb  11/22 1.89 3.81    1/23 2.7 4.9 <<>>7.0 ?? 12.9  6/23 1.73 5.0 11.2    Records and Results Reviewed   Past Medical History:  Diagnosis Date   Anemia    Aortic root dilatation ([place]) 05/11/2012   CTA CHEST   Arthritis    Cardiac asystole, post DCCV requiring use of temp. external pacemaker leads 07/19/2012   Chronic kidney disease    Stage 4   GERD (gastroesophageal reflux disease)    H/O aortic valve disorder 10/04/2002   Dr. 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 McAlhany & Bartle   Thoracic ascending aortic aneurysm ([facility]    CTA CHEST 05/11/12    Past Surgical History:  Procedure Laterality Date   A-FLUTTER ABLATION N/A 01/07/2022   Procedure: A-FLUTTER ABLATION;  Surgeon: KDeboraha Sprang MD;  [facility];  Service: Cardiovascular;  Laterality: N/A;   ABDOMINAL HYSTERECTOMY     AORTIC VALVE REPLACEMENT  10/04/2002    Dr. PTharon AquasTrigt    #21 pericardial stented valve   CARDIOVERSION   07/15/2012   Procedure: CARDIOVERSION;  Surgeon: KPixie Casino MD;  [place]  Service: Cardiovascular;  Laterality: N/A;   CARDIOVERSION N/A 06/05/2020   Procedure: CARDIOVERSION;  Surgeon: TSueanne Margarita MD;  [facility];  Service: Cardiovascular;  Laterality: N/A;   CARDIOVERSION N/A 08/09/2021   Procedure: CARDIOVERSION;  Surgeon: SJerline Pain MD;  [facility];  Service: Cardiovascular;  Laterality: N/A;   EYE SURGERY     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   INTRAOPERATIVE TRANSTHORACIC ECHOCARDIOGRAM N/A 06/19/2021   Procedure: INTRAOPERATIVE TRANSTHORACIC ECHOCARDIOGRAM;  Surgeon: MBurnell Blanks MD;  [place]  Service: Open Heart Surgery;  Laterality: N/A;   LEFT AND RIGHT HEART CATHETERIZATION WITH CORONARY ANGIOGRAM N/A 06/18/2012   Procedure: LEFT AND RIGHT HEART CATHETERIZATION WITH CORONARY ANGIOGRAM;  Surgeon: JLorretta Harp MD;  [facility];  Service: Cardiovascular;  Laterality: N/A;   NM MYOCAR PERF WALL MOTION  10/29/2010   normal   REPLACEMENT ASCENDING AORTA  07/07/2012   Procedure: REPLACEMENT ASCENDING AORTA;  Surgeon: PIvin Poot MD;  [place]  Service: Open Heart Surgery;  Laterality: N/A;  CIRCULATORY ARREST   RIGHT HEART CATH AND CORONARY ANGIOGRAPHY  N/A 04/19/2021   Procedure: RIGHT HEART CATH AND CORONARY ANGIOGRAPHY;  Surgeon: Burnell Blanks, MD;  Location: [place] [facility] LAB;  Service: Cardiovascular;  Laterality: N/A;   TEE WITHOUT CARDIOVERSION  06/18/2012   Procedure: TRANSESOPHAGEAL ECHOCARDIOGRAM (TEE);  Surgeon: Sanda Ellissa Ayo, MD;  Location: [facility] END[facility]COPY;  Service: Cardiovascular;  Laterality: N/A;  right and left heart cath after this procedure   TEE WITHOUT CARDIOVERSION  07/15/2012   Procedure: TRANSESOPHAGEAL ECHOCARDIOGRAM (TEE);  Surgeon: Pixie Casino, MD;  Location: [facility];  Service: Cardiovascular;  Laterality: N/A;   TEE WITHOUT CARDIOVERSION N/A  03/27/2021   Procedure: TRANSESOPHAGEAL ECHOCARDIOGRAM (TEE);  Surgeon: Sanda Syleena Mchan, MD;  Location: [place];  Service: Cardiovascular;  Laterality: N/A;   TONSILLECTOMY     TRANSCATHETER AORTIC VALVE REPLACEMENT, TRANSFEMORAL N/A 06/19/2021   Procedure: TRANSCATHETER AORTIC VALVE REPLACEMENT, TRANSFEMORAL USING A 23MM EDWARDS VALVE;  Surgeon: Burnell Blanks, MD;  Location: [place];  Service: Open Heart Surgery;  Laterality: N/A;    Current Meds  Medication Sig   albuterol (VENTOLIN HFA) 108 (90 Base) MCG/ACT inhaler Inhale 2 puffs into the lungs every 6 (six) hours as needed for wheezing or shortness of breath.   alendronate (FOSAMAX) 70 MG tablet Take 70 mg by mouth every Thursday.   amiodarone (PACERONE) 200 MG tablet Take 0.5 tablets (100 mg total) by mouth daily.   amoxicillin (AMOXIL) 500 MG tablet Take 2,000 mg by mouth See admin instructions. Pt will need Amoxicillin 2 grams prior to dental procedures   apixaban (ELIQUIS) 2.5 MG TABS tablet TAKE 1 TABLET BY MOUTH TWICE A DAY   azelastine (ASTELIN) 0.1 % nasal spray Place 2 sprays into both nostrils daily as needed for allergies.   b complex vitamins capsule Take 1 capsule by mouth daily.   Calcium Carbonate-Vit D-Min (CALTRATE MINIS PLUS MINERALS PO) Take 2 tablets by mouth every morning.   cholecalciferol (VITAMIN D) 25 MCG (1000 UNIT) tablet Take 1,000 Units by mouth every evening.   FARXIGA 10 MG TABS tablet Take 10 mg by mouth daily.   ipratropium (ATROVENT) 0.06 % nasal spray 2 sprays in each nostril every 6 hours if needed (Patient taking differently: 2 sprays every 6 (six) hours as needed for rhinitis.)   ipratropium-albuterol (DUONEB) 0.5-2.5 (3) MG/3ML SOLN Inhale 3 mLs into the lungs every 6 (six) hours as needed (shortness of breath or wheezing).   multivitamin-lutein (OCUVITE-LUTEIN) CAPS capsule Take 1 capsule by mouth daily.   pantoprazole (PROTONIX) 40 MG tablet TAKE ONE TABLET BY MOUTH ONCE DAILY BEFORE  meal of choice   Polyethyl Glycol-Propyl Glycol (SYSTANE) 0.4-0.3 % SOLN Place 1 drop into both eyes 2 (two) times daily as needed (dry eyes).   pravastatin (PRAVACHOL) 10 MG tablet Take 10 mg by mouth every evening.   SUMAtriptan (IMITREX) 100 MG tablet Take 100 mg by mouth every 2 (two) hours as needed for migraine. May repeat in 2 hours if headache persists or recurs.    Allergies  Allergen Reactions   Other Nausea And Vomiting    Headache also   Artificial sweetners   Codeine Nausea And Vomiting      Review of Systems negative except from HPI and PMH  Physical Exam BP 122/68   Pulse 64   Ht '5\' 1"'$  (1.549 m)   Wt 108 lb (49 kg)   SpO2 97%   BMI 20.41 kg/m  Well developed and well nourished in no acute distress HENT normal with large hematoma over left zygomatic  arch E scleral and icterus clear Neck Supple JVP flat; carotids brisk and full Clear to ausculation Regular rate and rhythm, no murmurs gallops or rub Soft with active bowel sounds No clubbing cyanosis  Edema Alert and oriented, grossly normal motor and sensory function Skin Warm and Dry  ECG sinus at 64 Interval 22/09/42 Otherwise normal  CrCl cannot be calculated (Patient's most recent lab result is older than the maximum 21 days allowed.).   Assessment and  Plan Atrial flutter-probably typical-recurrent status post ablation   Aortic stenosis status post surgical AVR, and then TAVR valve in valve   Amiodarone   Sinus bradycardia-iatrogenic   Renal insufficiency grade 5  No interval atrial arrhythmias.  We will discontinue the amiodarone.  We will continue the anticoagulation given the potential for the amiodarone to mask incomplete ablation.  Over the next 6 months or so if there are no recurrent atrial arrhythmias, see below, would discontinue the at apixaban.  For now we will continue it at 2.5 mg twice daily.  Lengthy discussion regarding her fall.  Discussed the potential role of an Apple  watch which has full detection.  It could also be used for atrial fibrillation detection post ablation.  Recent paperwork suggested that his sensitivity may not be quite adequate, however, for prolonged episodes that would justify the need for ongoing anticoagulation I suspect it is related adequate.    She will follow-up with Dr. Alvester Chou we will be available as needed    Current medicines are reviewed at length with the patient today .  The patient does not  have concerns regarding medicines.

## 2022-02-06 NOTE — Patient Instructions (Signed)
Medication Instructions:   Your physician has recommended you make the following change in your medication:   ** Stop Amiodarone   *If you need a refill on your cardiac medications before your next appointment, please call your pharmacy*   Lab Work:  CBC today  If you have labs (blood work) drawn today and your tests are completely normal, you will receive your results only by: [place] (if you have MyChart) OR A paper copy in the mail If you have any lab test that is abnormal or we need to change your treatment, we will call you to review the results.   Testing/Procedures:  None ordered.    Follow-Up: At [facility], you and your health needs are our priority.  As part of our continuing mission to provide you with exceptional heart care, we have created designated Provider Care Teams.  These Care Teams include your primary Cardiologist (physician) and Advanced Practice Providers (APPs -  Physician Assistants and Nurse Practitioners) who all work together to provide you with the care you need, when you need it.  We recommend signing up for the patient portal called "MyChart".  Sign up information is provided on this After Visit Summary.  MyChart is used to connect with patients for Virtual Visits (Telemedicine).  Patients are able to view lab/test results, encounter notes, upcoming appointments, etc.  Non-urgent messages can be sent to your provider as well.   To learn more about what you can do with MyChart, go to [email or web address].    Your next appointment:   Follow up with Dr [name]  Important Information About Sugar

## 2022-02-11 ENCOUNTER — Ambulatory Visit (HOSPITAL_COMMUNITY): Payer: Medicare Other | Attending: Cardiology

## 2022-02-11 DIAGNOSIS — Z952 Presence of prosthetic heart valve: Secondary | ICD-10-CM | POA: Insufficient documentation

## 2022-02-12 LAB — ECHOCARDIOGRAM COMPLETE
AR max vel: 1.08 cm2
AV Area VTI: 1.17 cm2
AV Area mean vel: 1.2 cm2
AV Mean grad: 12.3 mmHg
AV Peak grad: 23.7 mmHg
Ao pk vel: 2.43 m/s
Area-P 1/2: 1.46 cm2
S' Lateral: 2.9 cm

## 2022-02-13 DIAGNOSIS — R579 Shock, unspecified: Secondary | ICD-10-CM | POA: Diagnosis not present

## 2022-02-13 DIAGNOSIS — Z6820 Body mass index (BMI) 20.0-20.9, adult: Secondary | ICD-10-CM | POA: Diagnosis not present

## 2022-02-13 DIAGNOSIS — H05232 Hemorrhage of left orbit: Secondary | ICD-10-CM | POA: Diagnosis not present

## 2022-02-13 DIAGNOSIS — N179 Acute kidney failure, unspecified: Secondary | ICD-10-CM | POA: Diagnosis not present

## 2022-02-26 DIAGNOSIS — N184 Chronic kidney disease, stage 4 (severe): Secondary | ICD-10-CM | POA: Diagnosis not present

## 2022-02-27 ENCOUNTER — Other Ambulatory Visit: Payer: Self-pay | Admitting: Physician Assistant

## 2022-02-27 DIAGNOSIS — Z952 Presence of prosthetic heart valve: Secondary | ICD-10-CM

## 2022-03-04 DIAGNOSIS — I129 Hypertensive chronic kidney disease with stage 1 through stage 4 chronic kidney disease, or unspecified chronic kidney disease: Secondary | ICD-10-CM | POA: Diagnosis not present

## 2022-03-04 DIAGNOSIS — D631 Anemia in chronic kidney disease: Secondary | ICD-10-CM | POA: Diagnosis not present

## 2022-03-04 DIAGNOSIS — N184 Chronic kidney disease, stage 4 (severe): Secondary | ICD-10-CM | POA: Diagnosis not present

## 2022-03-04 DIAGNOSIS — N2581 Secondary hyperparathyroidism of renal origin: Secondary | ICD-10-CM | POA: Diagnosis not present

## 2022-03-04 DIAGNOSIS — I350 Nonrheumatic aortic (valve) stenosis: Secondary | ICD-10-CM | POA: Diagnosis not present

## 2022-03-05 ENCOUNTER — Ambulatory Visit (INDEPENDENT_AMBULATORY_CARE_PROVIDER_SITE_OTHER): Payer: Medicare Other | Admitting: Cardiovascular Disease

## 2022-03-05 ENCOUNTER — Encounter: Payer: Self-pay | Admitting: Cardiovascular Disease

## 2022-03-05 VITALS — BP 136/88 | HR 63 | Ht 61.0 in | Wt 109.4 lb

## 2022-03-05 DIAGNOSIS — I7121 Aneurysm of the ascending aorta, without rupture: Secondary | ICD-10-CM | POA: Diagnosis not present

## 2022-03-05 DIAGNOSIS — I483 Typical atrial flutter: Secondary | ICD-10-CM

## 2022-03-05 DIAGNOSIS — R0989 Other specified symptoms and signs involving the circulatory and respiratory systems: Secondary | ICD-10-CM

## 2022-03-05 DIAGNOSIS — Z8679 Personal history of other diseases of the circulatory system: Secondary | ICD-10-CM | POA: Diagnosis not present

## 2022-03-05 NOTE — Assessment & Plan Note (Signed)
History of typical atrial flutter status post ablation by Dr. [name] 01/07/2022.  She is on low-dose Eliquis and is no longer on amiodarone.  She is maintaining sinus rhythm.

## 2022-03-05 NOTE — Patient Instructions (Signed)
Medication Instructions:  No Changes In Medications at this time.  *If you need a refill on your cardiac medications before your next appointment, please call your pharmacy*  Lab Work: None Ordered At This Time.  If you have labs (blood work) drawn today and your tests are completely normal, you will receive your results only by: [place] (if you have MyChart) OR A paper copy in the mail If you have any lab test that is abnormal or we need to change your treatment, we will call you to review the results.  Testing/Procedures: Your physician has requested that you have a carotid duplex. This test is an ultrasound of the carotid arteries in your neck. It looks at blood flow through these arteries that supply the brain with blood. Allow one hour for this exam. There are no restrictions or special instructions.   Follow-Up: At [facility], you and your health needs are our priority.  As part of our continuing mission to provide you with exceptional heart care, we have created designated Provider Care Teams.  These Care Teams include your primary Cardiologist (physician) and Advanced Practice Providers (APPs -  Physician Assistants and Nurse Practitioners) who all work together to provide you with the care you need, when you need it.  Your next appointment:   1 year(s)  The format for your next appointment:   In Person  Provider:   Quay Burow, MD

## 2022-03-05 NOTE — Assessment & Plan Note (Signed)
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 Angelena Form who performed right left heart cath revealing normal coronary arteries and ultimately underwent valve in valve TAVR along with Dr. Cyndia Bent 06/19/2021 with a SAPIEN Resilia 23 mm valve.  Her most recent 2D echo performed 02/11/2022 revealed normal LV size and function with a well-functioning aortic bioprosthesis and no evidence of AI.

## 2022-03-05 NOTE — Progress Notes (Signed)
[birth year] [name] [name]   [birth year]  [id number]  Primary Physician [name], MD Primary Cardiologist: Lorretta Harp MD Lupe Carney, [place]  HPI:  Lashane Whelpley Rupnow is a 81 y.o.   thin-appearing, married  female, mother of 41, grandmother to 1 grandchild who I last saw in the office 03/30/2021..  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 Her prosthetic valve which was placed 10 years ago (January 2004) was resuspended. I had cath'd her June 18, 2012, revealing normal coronary arteries, normal LV function and normal right heart pressures. Her postop course was uncomplicated except for some minor A-fib/flutter which converted to sinus rhythm prior to discharge. She recovered nicely from her descending thoracic aortic aneurysm resection and grafting. I last saw her 11/24/15.Marland KitchenShe had 2 episodes of worrisome chest pain prior to that visit which were nitrate responsive. The pain radiated  into her neck shoulders and back as well. A Myoview stress test performed 12/01/15 was low risk and not ischemic. A 2-D echo showed normal LV size and function, mild LVH with moderate aortic insufficiency. She's had no recurrent chest pain. She was diagnosed with gastric ulcers thought to be related to her low dose aspirin which has been discontinued.   Her husband of 84 years unfortunately died 08-17-18 after a prolonged hospitalization and recovery from stroke and heart attack.  She currently lives alone.  She gets occasional atypical chest pain.  She denies shortness of breath.  2D echo performed 12/16/2018 revealed normal LV systolic function with moderate aortic insufficiency and increase in her mean aortic valve gradient from 13 to 21 mmHg.  The ascending graft was visualized as well.   She was seen in the A. fib clinic by Malka So, placed on Eliquis and underwent successful outpatient DC  cardioversion by Dr. Radford Pax on 06/05/2020. Her last EKG performed 06/12/2020 revealed sinus rhythm.  She has since been started on amiodarone and is maintaining sinus rhythm.  She saw Laurann Montana in the office 02/19/2021 with increasing shortness of breath.  She is on torsemide.  Recent 2D echo performed 03/06/2021 revealed normal LV systolic function, grade 2 diastolic dysfunction with moderate AS and moderate or greater AI.  I believe that her symptoms are related to her prosthetic valve malfunction.  I referred her to Dr. Angelena Form who did a right left heart cath revealing normal coronary arteries and ultimately performed valve in valve TAVR with Dr. Cyndia Bent 06/19/2021 with a SAPIEN Resilia 23 mm valve.  Her most recent 2D echo performed 02/11/2022 revealed normal LV size and function with a well-functioning aortic bioprosthesis and no AI.  In addition, she underwent a flutter ablation by Dr. [name] 01/07/2022 currently maintaining sinus rhythm on low-dose Eliquis.  She feels clinically improved.   Current Meds  Medication Sig   albuterol (VENTOLIN HFA) 108 (90 Base) MCG/ACT inhaler Inhale 2 puffs into the lungs every 6 (six) hours as needed for wheezing or shortness of breath.   alendronate (FOSAMAX) 70 MG tablet Take 70 mg by mouth every Thursday.   amoxicillin (AMOXIL) 500 MG tablet Take 2,000 mg by mouth See admin instructions. Pt will need Amoxicillin 2 grams prior to dental procedures   apixaban (ELIQUIS) 2.5 MG TABS tablet TAKE 1 TABLET BY MOUTH TWICE A DAY   azelastine (ASTELIN) 0.1 % nasal spray Place 2 sprays into both nostrils daily as needed for allergies.   b complex vitamins capsule  Take 1 capsule by mouth daily.   Calcium Carbonate-Vit D-Min (CALTRATE MINIS PLUS MINERALS PO) Take 2 tablets by mouth every morning.   cholecalciferol (VITAMIN D) 25 MCG (1000 UNIT) tablet Take 1,000 Units by mouth every evening.   FARXIGA 10 MG TABS tablet Take 10 mg by mouth daily.   ipratropium (ATROVENT)  0.06 % nasal spray 2 sprays in each nostril every 6 hours if needed (Patient taking differently: 2 sprays every 6 (six) hours as needed for rhinitis.)   ipratropium-albuterol (DUONEB) 0.5-2.5 (3) MG/3ML SOLN Inhale 3 mLs into the lungs every 6 (six) hours as needed (shortness of breath or wheezing).   multivitamin-lutein (OCUVITE-LUTEIN) CAPS capsule Take 1 capsule by mouth daily.   pantoprazole (PROTONIX) 40 MG tablet TAKE ONE TABLET BY MOUTH ONCE DAILY BEFORE meal of choice   Polyethyl Glycol-Propyl Glycol (SYSTANE) 0.4-0.3 % SOLN Place 1 drop into both eyes 2 (two) times daily as needed (dry eyes).   pravastatin (PRAVACHOL) 10 MG tablet Take 10 mg by mouth every evening.   SUMAtriptan (IMITREX) 100 MG tablet Take 100 mg by mouth every 2 (two) hours as needed for migraine. May repeat in 2 hours if headache persists or recurs.     Allergies  Allergen Reactions   Other Nausea And Vomiting    Headache also   Artificial sweetners   Codeine Nausea And Vomiting    Social History   Socioeconomic History   Marital status: Widowed    Spouse name: Not on file   Number of children: 1   Years of education: Not on file   Highest education level: Not on file  Occupational History   Occupation: Retired-realtor  Tobacco Use   Smoking status: Never   Smokeless tobacco: Never  Vaping Use   Vaping Use: Never used  Substance and Sexual Activity   Alcohol use: No   Drug use: No   Sexual activity: Not Currently  Other Topics Concern   Not on file  Social History Narrative   Not on file   Social Determinants of Health   Financial Resource Strain: Not on file  Food Insecurity: Not on file  Transportation Needs: Not on file  Physical Activity: Not on file  Stress: Not on file  Social Connections: Not on file  Intimate Partner Violence: Not on file     Review of Systems: General: negative for chills, fever, night sweats or weight changes.  Cardiovascular: negative for chest pain,  dyspnea on exertion, edema, orthopnea, palpitations, paroxysmal nocturnal dyspnea or shortness of breath Dermatological: negative for rash Respiratory: negative for cough or wheezing Urologic: negative for hematuria Abdominal: negative for nausea, vomiting, diarrhea, bright red blood per rectum, melena, or hematemesis Neurologic: negative for visual changes, syncope, or dizziness All other systems reviewed and are otherwise negative except as noted above.    Blood pressure 136/88, pulse 63, height '5\' 1"'$  (1.549 m), weight 109 lb 6.4 oz (49.6 kg), SpO2 96 %.  General appearance: alert and no distress Neck: no adenopathy, no carotid bruit, no JVD, supple, symmetrical, trachea midline, and thyroid not enlarged, symmetric, no tenderness/mass/nodules Lungs: clear to auscultation bilaterally Heart: regular rate and rhythm, S1, S2 normal, no murmur, click, rub or gallop Extremities: extremities normal, atraumatic, no cyanosis or edema Pulses: 2+ and symmetric Skin: Skin color, texture, turgor normal. 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 Angelena Form who performed right left heart cath revealing normal coronary arteries and ultimately underwent valve in valve TAVR along with Dr. Cyndia Bent 06/19/2021 with a SAPIEN Resilia 23 mm valve.  Her most recent 2D echo performed 02/11/2022 revealed normal LV size and function with a well-functioning aortic bioprosthesis and no evidence of AI.  Thoracic ascending aortic aneurysm (H[facility]) History of thoracic root repair remotely with recent echo that revealed normal ascending thoracic aortic dimensions.  Typical atrial flutter ([place]) History of typical atrial flutter status post ablation by Dr. [name] 01/07/2022.  She is on low-dose Eliquis and is no longer on amiodarone.   She is maintaining sinus rhythm.     Lorretta Harp MD FACP,FACC,FAHA, [facility] 03/05/2022 11:29 AM

## 2022-03-05 NOTE — Assessment & Plan Note (Signed)
History of thoracic root repair remotely with recent echo that revealed normal ascending thoracic aortic dimensions.

## 2022-03-12 DIAGNOSIS — H81313 Aural vertigo, bilateral: Secondary | ICD-10-CM | POA: Diagnosis not present

## 2022-03-12 DIAGNOSIS — E162 Hypoglycemia, unspecified: Secondary | ICD-10-CM | POA: Diagnosis not present

## 2022-03-12 DIAGNOSIS — I951 Orthostatic hypotension: Secondary | ICD-10-CM | POA: Diagnosis not present

## 2022-03-12 DIAGNOSIS — R350 Frequency of micturition: Secondary | ICD-10-CM | POA: Diagnosis not present

## 2022-03-12 DIAGNOSIS — N1832 Chronic kidney disease, stage 3b: Secondary | ICD-10-CM | POA: Diagnosis not present

## 2022-03-12 DIAGNOSIS — R3915 Urgency of urination: Secondary | ICD-10-CM | POA: Diagnosis not present

## 2022-03-18 ENCOUNTER — Ambulatory Visit (HOSPITAL_COMMUNITY): Admission: RE | Admit: 2022-03-18 | Payer: Medicare Other | Source: Ambulatory Visit

## 2022-04-01 ENCOUNTER — Ambulatory Visit (HOSPITAL_COMMUNITY)
Admission: RE | Admit: 2022-04-01 | Discharge: 2022-04-01 | Disposition: A | Discharge status: Home / Self Care | Payer: Medicare Other | Source: Ambulatory Visit | Attending: Cardiology | Admitting: Cardiology

## 2022-04-01 DIAGNOSIS — R0989 Other specified symptoms and signs involving the circulatory and respiratory systems: Secondary | ICD-10-CM | POA: Insufficient documentation

## 2022-04-05 DIAGNOSIS — H81311 Aural vertigo, right ear: Secondary | ICD-10-CM | POA: Diagnosis not present

## 2022-04-05 DIAGNOSIS — Z6821 Body mass index (BMI) 21.0-21.9, adult: Secondary | ICD-10-CM | POA: Diagnosis not present

## 2022-04-17 DIAGNOSIS — H8112 Benign paroxysmal vertigo, left ear: Secondary | ICD-10-CM | POA: Diagnosis not present

## 2022-04-17 DIAGNOSIS — H81311 Aural vertigo, right ear: Secondary | ICD-10-CM | POA: Diagnosis not present

## 2022-04-30 DIAGNOSIS — L821 Other seborrheic keratosis: Secondary | ICD-10-CM | POA: Diagnosis not present

## 2022-04-30 DIAGNOSIS — L570 Actinic keratosis: Secondary | ICD-10-CM | POA: Diagnosis not present

## 2022-04-30 DIAGNOSIS — L578 Other skin changes due to chronic exposure to nonionizing radiation: Secondary | ICD-10-CM | POA: Diagnosis not present

## 2022-04-30 DIAGNOSIS — L728 Other follicular cysts of the skin and subcutaneous tissue: Secondary | ICD-10-CM | POA: Diagnosis not present

## 2022-05-01 DIAGNOSIS — B5801 Toxoplasma chorioretinitis: Secondary | ICD-10-CM | POA: Diagnosis not present

## 2022-05-01 DIAGNOSIS — H5213 Myopia, bilateral: Secondary | ICD-10-CM | POA: Diagnosis not present

## 2022-05-06 DIAGNOSIS — H903 Sensorineural hearing loss, bilateral: Secondary | ICD-10-CM | POA: Diagnosis not present

## 2022-05-21 DIAGNOSIS — R7302 Impaired glucose tolerance (oral): Secondary | ICD-10-CM | POA: Diagnosis not present

## 2022-05-21 DIAGNOSIS — N2581 Secondary hyperparathyroidism of renal origin: Secondary | ICD-10-CM | POA: Diagnosis not present

## 2022-05-21 DIAGNOSIS — N184 Chronic kidney disease, stage 4 (severe): Secondary | ICD-10-CM | POA: Diagnosis not present

## 2022-05-21 DIAGNOSIS — I4891 Unspecified atrial fibrillation: Secondary | ICD-10-CM | POA: Diagnosis not present

## 2022-05-21 DIAGNOSIS — Z79899 Other long term (current) drug therapy: Secondary | ICD-10-CM | POA: Diagnosis not present

## 2022-05-21 DIAGNOSIS — Z0000 Encounter for general adult medical examination without abnormal findings: Secondary | ICD-10-CM | POA: Diagnosis not present

## 2022-05-21 DIAGNOSIS — E782 Mixed hyperlipidemia: Secondary | ICD-10-CM | POA: Diagnosis not present

## 2022-05-21 DIAGNOSIS — E039 Hypothyroidism, unspecified: Secondary | ICD-10-CM | POA: Diagnosis not present

## 2022-05-21 DIAGNOSIS — Z23 Encounter for immunization: Secondary | ICD-10-CM | POA: Diagnosis not present

## 2022-05-21 DIAGNOSIS — I272 Pulmonary hypertension, unspecified: Secondary | ICD-10-CM | POA: Diagnosis not present

## 2022-05-23 NOTE — Progress Notes (Signed)
[facility] [place]                                     Cardiology Office Note:    Date:  05/24/2022   ID:  ANGELISE PETRICH, DOB [birth year], MRN [id number]  PCP:  [name], MD  [facility] Cardiologist:  Quay Burow, MD / Dr. Angelena Form & Dr. Cyndia Bent (TAVR)  [facility] Electrophysiologist:  None   Referring MD: [name], MD   1 year s/p TAVR  History of Present Illness:    [name] [name] is a 81 y.o. female with a hx of CKD stage IIIb, GERD, HLD, PAF on amio & Eliquis, thoracic aortic aneurysm, CKD stage IIIb, aortic valve disease s/p bioprosthetic AVR (21 mm Edwards Perimount J4782 bioprosthetic valve-Dr. 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    She is status post biological Bentall procedure in 2004 by Dr. Prescott Gum. She has a 21 mm Edwards Perimount 7200 bioprosthetic valve.  She had an echocardiogram on 03/06/2021 that showed the bioprosthetic aortic valve to be heavily calcified. The mean gradient was 28 mmHg with at least moderate eccentric aortic insufficiency. Aortic root was noted to be 40 mm. She underwent TEE on 03/27/2021 for further evaluation of the valve which showed moderate to severe insufficiency with no paravalvular leak.  The mean gradient was 13.4 mmHg with an AI pressure half-time of 336 ms.  Left ventricular ejection fraction was 60 to 65%.  There is grade 2 diastolic dysfunction.  There was mild to moderate mitral regurgitation. [facility] 04/19/21 showed no CAD. Hemodynamic findings consistent with moderate pulmonary hypertension and elevated right heart pressures. Her torsemide was increased to 20 mg daily at that time.    She was evaluated by the multidisciplinary valve team and underwent valve in valve TAVR 06/19/21 with a 23 mm Edwards Sapien 3 Ultra Resilia THV via the TF approach. Post operative echo  showed EF 60-65%, stable valve placement with mean gradient at 61mHg, peak at 325mg, AVA by VTI 0.41 and no PVL. She was resumed on home Eliquis at prior dose. 1 month echo showed EF 65%, normally functioning TAVR with a mean gradient of 12 mm hg and no PVL.   She had issues with recurrent atrial flutter and underwent catheter ablation 01/2022. Taken off amiodarone.   Today the patient presents to clinic for follow up. Here alone. No CP or SOB. No LE edema, orthopnea or PND. She does occasionally get some swelling in her toes. No dizziness or syncope. No blood in stool or urine. No palpitations. Wonders if she can come of Eliquis since she had the ablation and no recurrence.   Past Medical History:  Diagnosis Date   Anemia    A[place] dil[place]   CTA CHEST   Arthritis    Cardiac asystole, post DCCV requiring use of temp. external pacemaker leads 07/19/2012   Chronic kidney disease    Stage 4   GERD (gastroesophageal reflux disease)    H/O aortic valve disorder 10/04/2002   Dr. PeTharon Aquasrigt   History of migraine headaches    Hyperlipidemia    on statin therapy   Migraines    PAF (paroxysmal atrial fibrillation) ([place]   Prosthetic valve dysfunction    S/P valve  in valve TAVR (transcatheter aortic valve replacement) 06/19/2021   s/p valve in valve TAVR with a 87m Edwards S3UR via the TF approach via the TF approach by Drs. McAlhany & Bartle   Thoracic ascending aortic aneurysm ([facility]    CTA CHEST 05/11/12    Past Surgical History:  Procedure Laterality Date   A-FLUTTER ABLATION N/A 01/07/2022   Procedure: A-FLUTTER ABLATION;  Surgeon: KDeboraha Sprang MD;  [place];  Service: Cardiovascular;  Laterality: N/A;   ABDOMINAL HYSTERECTOMY     AORTIC VALVE REPLACEMENT  10/04/2002    Dr. PTharon AquasTrigt    #21 pericardial stented valve   CARDIOVERSION  07/15/2012   Procedure: CARDIOVERSION;  Surgeon: KPixie Casino MD;  [place]   Service: Cardiovascular;  Laterality: N/A;   CARDIOVERSION N/A 06/05/2020   Procedure: CARDIOVERSION;  Surgeon: TSueanne Margarita MD;  [facility];  Service: Cardiovascular;  Laterality: N/A;   CARDIOVERSION N/A 08/09/2021   Procedure: CARDIOVERSION;  Surgeon: SJerline Pain MD;  [facility];  Service: Cardiovascular;  Laterality: N/A;   EYE SURGERY     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   INTRAOPERATIVE TRANSTHORACIC ECHOCARDIOGRAM N/A 06/19/2021   Procedure: INTRAOPERATIVE TRANSTHORACIC ECHOCARDIOGRAM;  Surgeon: MBurnell Blanks MD;  [place]  Service: Open Heart Surgery;  Laterality: N/A;   LEFT AND RIGHT HEART CATHETERIZATION WITH CORONARY ANGIOGRAM N/A 06/18/2012   Procedure: LEFT AND RIGHT HEART CATHETERIZATION WITH CORONARY ANGIOGRAM;  Surgeon: JLorretta Harp MD;  [facility];  Service: Cardiovascular;  Laterality: N/A;   NM MYOCAR PERF WALL MOTION  10/29/2010   normal   REPLACEMENT ASCENDING AORTA  07/07/2012   Procedure: REPLACEMENT ASCENDING AORTA;  Surgeon: PIvin Poot MD;  [place]  Service: Open Heart Surgery;  Laterality: N/A;  CIRCULATORY ARREST   RIGHT HEART CATH AND CORONARY ANGIOGRAPHY N/A 04/19/2021   Procedure: RIGHT HEART CATH AND CORONARY ANGIOGRAPHY;  Surgeon: MBurnell Blanks MD;  [place] [facility];  Service: Cardiovascular;  Laterality: N/A;   TEE WITHOUT CARDIOVERSION  06/18/2012   Procedure: TRANSESOPHAGEAL ECHOCARDIOGRAM (TEE);  Surgeon: MSanda Klein MD;  [facility];  Service: Cardiovascular;  Laterality: N/A;  right and left heart cath after this procedure   TEE WITHOUT CARDIOVERSION  07/15/2012   Procedure: TRANSESOPHAGEAL ECHOCARDIOGRAM (TEE);  Surgeon: KPixie Casino MD;  [facility];  Service: Cardiovascular;  Laterality: N/A;   TEE WITHOUT CARDIOVERSION N/A 03/27/2021   Procedure: TRANSESOPHAGEAL ECHOCARDIOGRAM (TEE);  Surgeon: CSanda Klein MD;  [place]  Service: Cardiovascular;  Laterality: N/A;   TONSILLECTOMY     TRANSCATHETER AORTIC VALVE REPLACEMENT, TRANSFEMORAL N/A 06/19/2021   Procedure: TRANSCATHETER AORTIC VALVE REPLACEMENT, TRANSFEMORAL USING A 23MM EDWARDS VALVE;  Surgeon: MBurnell Blanks MD;  [place]  Service: Open Heart Surgery;  Laterality: N/A;    Current Medications: Current Meds  Medication Sig   alendronate (FOSAMAX) 70 MG tablet Take 70 mg by mouth every Thursday.   amoxicillin (AMOXIL) 500 MG tablet Take 2000 mg (FOUR TABLETS) ONE HOUR BEFORE any dental appointments   apixaban (ELIQUIS) 2.5 MG TABS tablet TAKE 1 TABLET BY MOUTH TWICE A DAY   azelastine (ASTELIN) 0.1 % nasal spray Place 2 sprays into both nostrils daily as needed for allergies.   b complex vitamins capsule Take 1 capsule by mouth daily.   Calcium Carbonate-Vit D-Min (CALTRATE MINIS PLUS MINERALS PO) Take 2 tablets by mouth  every morning.   cholecalciferol (VITAMIN D) 25 MCG (1000 UNIT) tablet Take 1,000 Units by mouth every evening.   FARXIGA 10 MG TABS tablet Take 10 mg by mouth daily.   ipratropium-albuterol (DUONEB) 0.5-2.5 (3) MG/3ML SOLN Inhale 3 mLs into the lungs every 6 (six) hours as needed (shortness of breath or wheezing).   multivitamin-lutein (OCUVITE-LUTEIN) CAPS capsule Take 1 capsule by mouth daily.   pantoprazole (PROTONIX) 40 MG tablet TAKE ONE TABLET BY MOUTH ONCE DAILY BEFORE meal of choice   Polyethyl Glycol-Propyl Glycol (SYSTANE) 0.4-0.3 % SOLN Place 1 drop into both eyes 2 (two) times daily as needed (dry eyes).   pravastatin (PRAVACHOL) 10 MG tablet Take 10 mg by mouth every evening.   SUMAtriptan (IMITREX) 100 MG tablet Take 100 mg by mouth every 2 (two) hours as needed for migraine. May repeat in 2 hours if headache persists or recurs.   [DISCONTINUED] amoxicillin (AMOXIL) 500 MG tablet Take 2,000 mg by mouth See admin instructions. Pt will need Amoxicillin 2 grams prior to dental procedures      Allergies:   Other and Codeine   Social History   Socioeconomic History   Marital status: Widowed    Spouse name: Not on file   Number of children: 1   Years of education: Not on file   Highest education level: Not on file  Occupational History   Occupation: Retired-realtor  Tobacco Use   Smoking status: Never   Smokeless tobacco: Never  Vaping Use   Vaping Use: Never used  Substance and Sexual Activity   Alcohol use: No   Drug use: No   Sexual activity: Not Currently  Other Topics Concern   Not on file  Social History Narrative   Not on file   Social Determinants of Health   Financial Resource Strain: Not on file  Food Insecurity: Not on file  Transportation Needs: Not on file  Physical Activity: Not on file  Stress: Not on file  Social Connections: Not on file     Family History: The patient's family history includes Heart attack in her father.  ROS:   Please see the history of present illness.    All other systems reviewed and are negative.  EKGs/Labs/Other Studies Reviewed:    The following studies were reviewed today:   TAVR OPERATIVE NOTE     Date of Procedure:                06/19/2021   Preoperative Diagnosis:      Severe Aortic Stenosis    Postoperative Diagnosis:    Same    Procedure:        Transcatheter Aortic Valve in Valve Replacement - Percutaneous Left Transfemoral Approach             Edwards Sapien 3 Ultra ResiliaTHV (size 23 mm, model # 9755RSL, serial # [id number])              Co-Surgeons:                        Gaye Pollack, MD and /  Lauree Chandler, MD     Anesthesiologist:                  Raechel Ache, MD   Echocardiographer:              Osborne Oman, MD   Pre-operative Echo Findings: Severe aortic stenosis Normal left ventricular systolic function   Post-operative Echo  Findings: Trivial paravalvular leak Normal left ventricular systolic function    ________________________  Echocardiogram 06/20/21:   1. There is a 23 mm Edwards Sapien prosthetic (TAVR) valve present in the  aortic position. Effective orifice area, by VTI measures 1.29 cm. Aortic  valve mean gradient measures 26 mmHg, peak gradient 36 mmHg. Aortic valve  Vmax measures 3.12 m/s. DVI  0.41. No PVL.   2. Left ventricular ejection fraction, by estimation, is 60 to 65%. The  left ventricle has normal function. The left ventricle has no regional  wall motion abnormalities. There is moderate concentric left ventricular  hypertrophy. Left ventricular  diastolic parameters are consistent with Grade II diastolic dysfunction  (pseudonormalization).   3. Right ventricular systolic function is normal. The right ventricular  size is normal. There is normal pulmonary artery systolic pressure. The  estimated right ventricular systolic pressure is 30.1 mmHg.   4. Left atrial size was severely dilated.   5. Right atrial size was mildly dilated.   6. The mitral valve is normal in structure. Trivial mitral valve  regurgitation. No evidence of mitral stenosis.   _________________________  Echo 07/25/21 IMPRESSIONS  1. Left ventricular ejection fraction, by estimation, is 55 to 60%. The left ventricle has normal function. The left ventricle has no regional wall motion abnormalities. There is moderate concentric left ventricular hypertrophy. Left ventricular  diastolic parameters are indeterminate.  2. Right ventricular systolic function is low normal. The right ventricular size is normal. There is normal pulmonary artery systolic pressure.  3. Left atrial size was moderately dilated.  4. Right atrial size was mildly dilated.  5. The mitral valve is normal in structure. Trivial mitral valve regurgitation. No evidence of mitral stenosis.  6. The aortic valve has been repaired/replaced. Aortic valve regurgitation is not visualized. There is a 23 mm Sapien prosthetic (TAVR) valve present in the aortic position. Procedure Date: 06/19/2021.  Echo findings are consistent with normal structure  and function of the aortic valve prosthesis. Aortic valve area, by VTI measures 1.33 cm. Aortic valve mean gradient measures 11.0 mmHg. Aortic valve Vmax measures 2.15 m/s.  7. The inferior vena cava is normal in size with greater than 50% respiratory variability, suggesting right atrial pressure of 3 mmHg.   Comparison(s): No significant change from prior study. Prior images reviewed side by side.   Conclusion(s)/Recommendation(s): S/P valve in valve TAVR. Gradients improved from prior study.  _________________________  Echo 05/24/22 IMPRESSIONS  1. S/P TAVR with mean gradient 9 mmHg and no AI; no change compared to  02/11/22.   2. Left ventricular ejection fraction, by estimation, is 60 to 65%. The  left ventricle has normal function. The left ventricle has no regional  wall motion abnormalities. There is mild left ventricular hypertrophy.  Left ventricular diastolic parameters  are consistent with Grade I diastolic dysfunction (impaired relaxation).  Elevated left atrial pressure.   3. Right ventricular systolic function is normal. The right ventricular  size is normal.   4. Left atrial size was mildly dilated.   5. Right atrial size was mildly dilated.   6. The mitral valve is normal in structure. Trivial mitral valve  regurgitation. No evidence of mitral stenosis.   7. The aortic valve has been repaired/replaced. Aortic valve  regurgitation is not visualized. No aortic stenosis is present. There is a  23 mm Sapien prosthetic (TAVR) valve present in the aortic position.  Procedure Date: 06/19/21. Echo findings are  consistent with normal structure and function of the aortic  valve  prosthesis.   8. The inferior vena cava is normal in size with greater than 50%  respiratory variability, suggesting right atrial pressure of 3 mmHg.   EKG:  EKG is NOT ordered today.   Recent Labs: 06/15/2021: B Natriuretic Peptide  2,830.7 06/20/2021: Magnesium 2.2 07/05/2021: ALT 16; TSH 1.986 01/03/2022: BUN 20; Creatinine, Ser 1.73; Potassium 5.0; Sodium 137 02/06/2022: Hemoglobin 11.3; Platelets 192  Recent Lipid Panel No results found for: "CHOL", "TRIG", "HDL", "CHOLHDL", "VLDL", "LDLCALC", "LDLDIRECT"   Physical Exam:    VS:  BP (!) 142/70   Pulse 61   Ht '5\' 1"'$  (1.549 m)   Wt 110 lb (49.9 kg)   SpO2 98%   BMI 20.78 kg/m     Wt Readings from Last 3 Encounters:  05/24/22 110 lb (49.9 kg)  03/05/22 109 lb 6.4 oz (49.6 kg)  02/06/22 108 lb (49 kg)    General: Petite, NAD Neck: No JVD Lungs:Clear to ausculation bilaterally. Breathing is unlabored. Cardiovascular: RRR, with S1 S2. + soft murmur Extremities: No edema.  Neuro: Alert and oriented. No focal deficits. No facial asymmetry. MAE spontaneously. Psych: Responds to questions appropriately with normal affect.    ASSESSMENT/PLAN:    Severe AS s/p valve in valve TAVR: echo today shows echo today shows EF 60%, normally functioning TAVR with a mean gradient of 9 mm hg and no PVL. She has NYHA class I symptoms. SBE prophylaxis discussed; she has amoxicillin. Continue regular follow up with Dr. [name]   Chronic diastolic CHF: she appears euvolemic off diuretics.    PAFib/flutter: s/p ablation in June 2023. Continue Eliquis. Wonders if she can come of Eliquis since she had the ablation and no recurrence. I advised she discuss this with Dr. [name].   HTN: Bp well controlled today. No changes made   CKD stage IIIb: Creat 1.79 in June which appears to be at her baseline. Follows with Dr. 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 Echo today showed normal aorta. No need for further imaging at this point.    Medication Adjustments/Labs and Tests Ordered: Current medicines are reviewed at length with the patient today.  Concerns regarding medicines are outlined above.  No  orders of the defined types were placed in this encounter.   Meds ordered this encounter  Medications   amoxicillin (AMOXIL) 500 MG tablet    Sig: Take 2000 mg (FOUR TABLETS) ONE HOUR BEFORE any dental appointments    Dispense:  12 tablet    Refill:  4     Patient Instructions  Medication Instructions:  No changes *If you need a refill on your cardiac medications before your next appointment, please call your pharmacy*   Lab Work: none If you have labs (blood work) drawn today and your tests are completely normal, you will receive your results only by: [place] (if you have MyChart) OR A paper copy in the mail If you have any lab test that is abnormal or we need to change your treatment, we will call you to review the results.   Testing/Procedures: none   Follow-Up: As planned with Dr. [name] (July 2024)   Important Information About Sugar         Signed, Angelena Form, PA-C  05/24/2022 1:56 PM    [facility]

## 2022-05-24 ENCOUNTER — Ambulatory Visit (HOSPITAL_COMMUNITY): Payer: Medicare Other | Attending: Physician Assistant

## 2022-05-24 ENCOUNTER — Ambulatory Visit (INDEPENDENT_AMBULATORY_CARE_PROVIDER_SITE_OTHER): Payer: Medicare Other | Admitting: Physician Assistant

## 2022-05-24 VITALS — BP 142/70 | HR 61 | Ht 61.0 in | Wt 110.0 lb

## 2022-05-24 DIAGNOSIS — I484 Atypical atrial flutter: Secondary | ICD-10-CM

## 2022-05-24 DIAGNOSIS — I10 Essential (primary) hypertension: Secondary | ICD-10-CM | POA: Insufficient documentation

## 2022-05-24 DIAGNOSIS — I7121 Aneurysm of the ascending aorta, without rupture: Secondary | ICD-10-CM

## 2022-05-24 DIAGNOSIS — Z952 Presence of prosthetic heart valve: Secondary | ICD-10-CM | POA: Insufficient documentation

## 2022-05-24 DIAGNOSIS — N1832 Chronic kidney disease, stage 3b: Secondary | ICD-10-CM

## 2022-05-24 DIAGNOSIS — I5032 Chronic diastolic (congestive) heart failure: Secondary | ICD-10-CM | POA: Insufficient documentation

## 2022-05-24 LAB — ECHOCARDIOGRAM COMPLETE
AV Mean grad: 9 mmHg
AV Peak grad: 17 mmHg
Ao pk vel: 2.06 m/s
Area-P 1/2: 2.22 cm2
S' Lateral: 2.9 cm

## 2022-05-24 MED ORDER — AMOXICILLIN 500 MG PO TABS: ORAL_TABLET | ORAL | 4 refills | Status: AC

## 2022-05-24 NOTE — Patient Instructions (Signed)
Medication Instructions:  No changes *If you need a refill on your cardiac medications before your next appointment, please call your pharmacy*   Lab Work: none If you have labs (blood work) drawn today and your tests are completely normal, you will receive your results only by: [place] (if you have MyChart) OR A paper copy in the mail If you have any lab test that is abnormal or we need to change your treatment, we will call you to review the results.   Testing/Procedures: none   Follow-Up: As planned with Dr. [name] (July 2024)   Important Information About Sugar

## 2022-07-03 DIAGNOSIS — N184 Chronic kidney disease, stage 4 (severe): Secondary | ICD-10-CM | POA: Diagnosis not present

## 2022-07-08 DIAGNOSIS — D631 Anemia in chronic kidney disease: Secondary | ICD-10-CM | POA: Diagnosis not present

## 2022-07-08 DIAGNOSIS — E559 Vitamin D deficiency, unspecified: Secondary | ICD-10-CM | POA: Diagnosis not present

## 2022-07-08 DIAGNOSIS — N184 Chronic kidney disease, stage 4 (severe): Secondary | ICD-10-CM | POA: Diagnosis not present

## 2022-07-08 DIAGNOSIS — I350 Nonrheumatic aortic (valve) stenosis: Secondary | ICD-10-CM | POA: Diagnosis not present

## 2022-07-08 DIAGNOSIS — I129 Hypertensive chronic kidney disease with stage 1 through stage 4 chronic kidney disease, or unspecified chronic kidney disease: Secondary | ICD-10-CM | POA: Diagnosis not present

## 2022-07-08 DIAGNOSIS — N2581 Secondary hyperparathyroidism of renal origin: Secondary | ICD-10-CM | POA: Diagnosis not present

## 2022-07-31 DIAGNOSIS — M791 Myalgia, unspecified site: Secondary | ICD-10-CM | POA: Diagnosis not present

## 2022-07-31 DIAGNOSIS — R519 Headache, unspecified: Secondary | ICD-10-CM | POA: Diagnosis not present

## 2022-07-31 DIAGNOSIS — R051 Acute cough: Secondary | ICD-10-CM | POA: Diagnosis not present

## 2022-07-31 DIAGNOSIS — R509 Fever, unspecified: Secondary | ICD-10-CM | POA: Diagnosis not present

## 2022-08-15 DIAGNOSIS — L219 Seborrheic dermatitis, unspecified: Secondary | ICD-10-CM | POA: Diagnosis not present

## 2022-08-20 ENCOUNTER — Other Ambulatory Visit: Payer: Self-pay | Admitting: Cardiovascular Disease

## 2022-09-06 ENCOUNTER — Telehealth: Payer: Self-pay | Admitting: Cardiovascular Disease

## 2022-09-06 NOTE — Telephone Encounter (Signed)
Pt c/o medication issue:  1. Name of Medication:   apixaban (ELIQUIS) 2.5 MG TABS tablet   2. How are you currently taking this medication (dosage and times per day)? As prescribed  3. Are you having a reaction (difficulty breathing--STAT)?  No  4. What is your medication issue?    Patient stated she would like to go off of this medication 2 days before and 1 day after dental surgery on 2/19.  Patient stated she will be dropping off paperwork related to this.

## 2022-09-09 NOTE — Telephone Encounter (Signed)
Called patient . She states she dropped information at the office today.   RN located request and will place for pre-op clearance pool

## 2022-09-09 NOTE — Telephone Encounter (Addendum)
   Patient Name: [name] [name]  DOB: [birth year] MRN: [id number]  Primary Cardiologist: Quay Burow, MD  Chart reviewed as part of pre-operative protocol coverage. Simple dental extractions (i.e. 1-2 teeth) are considered low risk procedures per guidelines and generally do not require any specific cardiac clearance. It is also generally accepted that for simple extractions and dental cleanings, there is no need to interrupt blood thinner therapy.  However, SBE prophylaxis IS required for the patient from a cardiac standpoint given history of TAVR. She has Amoxicillin prescribed. Please remind patient that she should take 2,'000mg'$  (four '500mg'$  tablets) of Amoxicillin one hour prior to dental procedure.   I will route this recommendation to the requesting party via Epic fax function and remove from pre-op pool.  Please call with questions.  Darreld Mclean, PA-C 09/09/2022, 2:46 PM

## 2022-09-09 NOTE — Telephone Encounter (Signed)
   Pre-operative Risk Assessment    Patient Name: [name] [name]  DOB: [birth year] MRN: [id number]     Request for Surgical Clearance    Procedure:  Dental Extraction - Amount of Teeth to be Pulled:  1 "T # 7" (   2. When is this surgery scheduled? Press F2 to enter date below and place date in Reason for Visit (see directions below). :1} Date of Surgery:  Clearance 09/23/22                                 Surgeon:  Dr Heloise Purpura Surgeon's Group or Practice Name:  [facility] Phone number:  [phone] Fax number:  [phone]   Type of Clearance Requested:   - Medical  - Pharmacy:  Hold Apixaban (Eliquis) 2 days    (  We usually discontinue  blood thinners 2 days before the  surgery  but we can still do surgery  while on her Eliquis  if needed    Type of Anesthesia:  Not Indicated   Additional requests/questions:     (" We usually discontinue  blood thinners 2 days before the  surgery  but we can still do surgery  while on her Eliquis  if needed "  Signed, Raiford Simmonds   09/09/2022, 2:28 PM

## 2022-09-17 IMAGING — CR DG CHEST 2V
2 series · 2 of 2 positions shown · non-contrast
Comparison: 01/16/2021

CLINICAL DATA: Shortness of breath for 5 days. History of aortic
root dilatation.

EXAM:
CHEST - 2 VIEW

[chest lat]
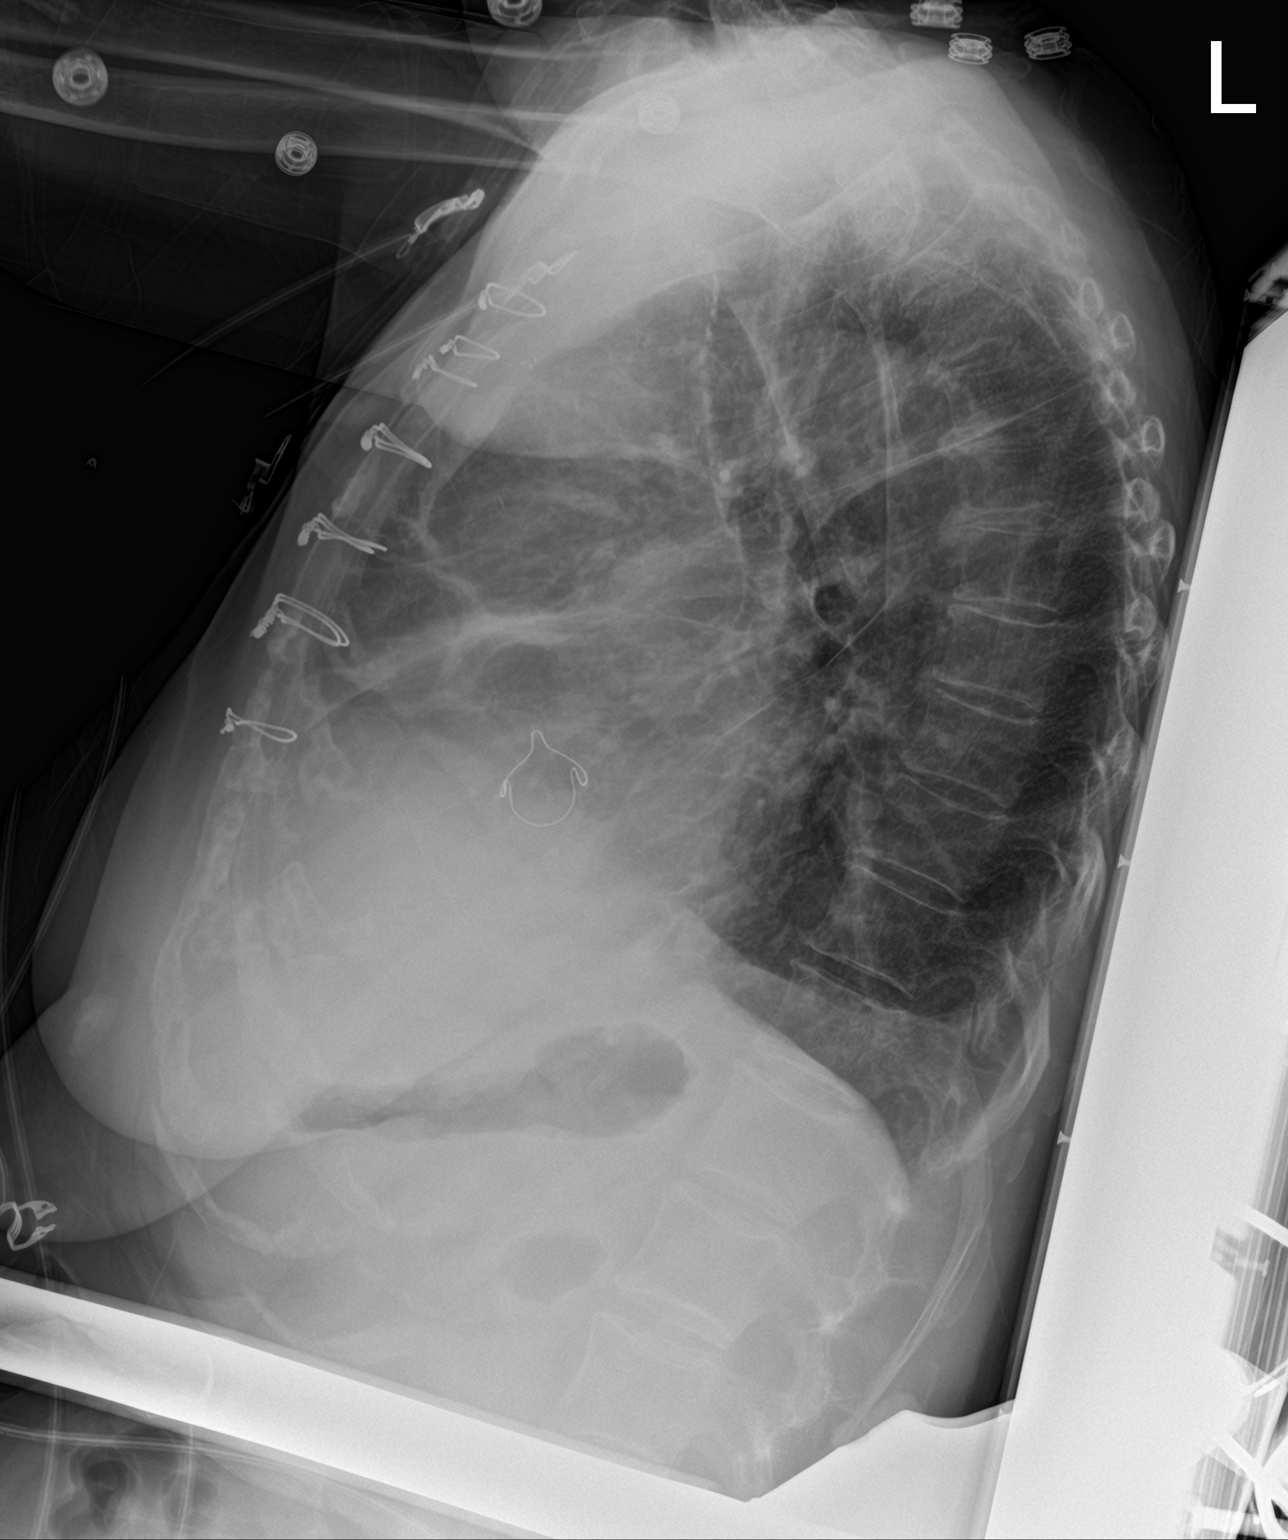

[chest ap]
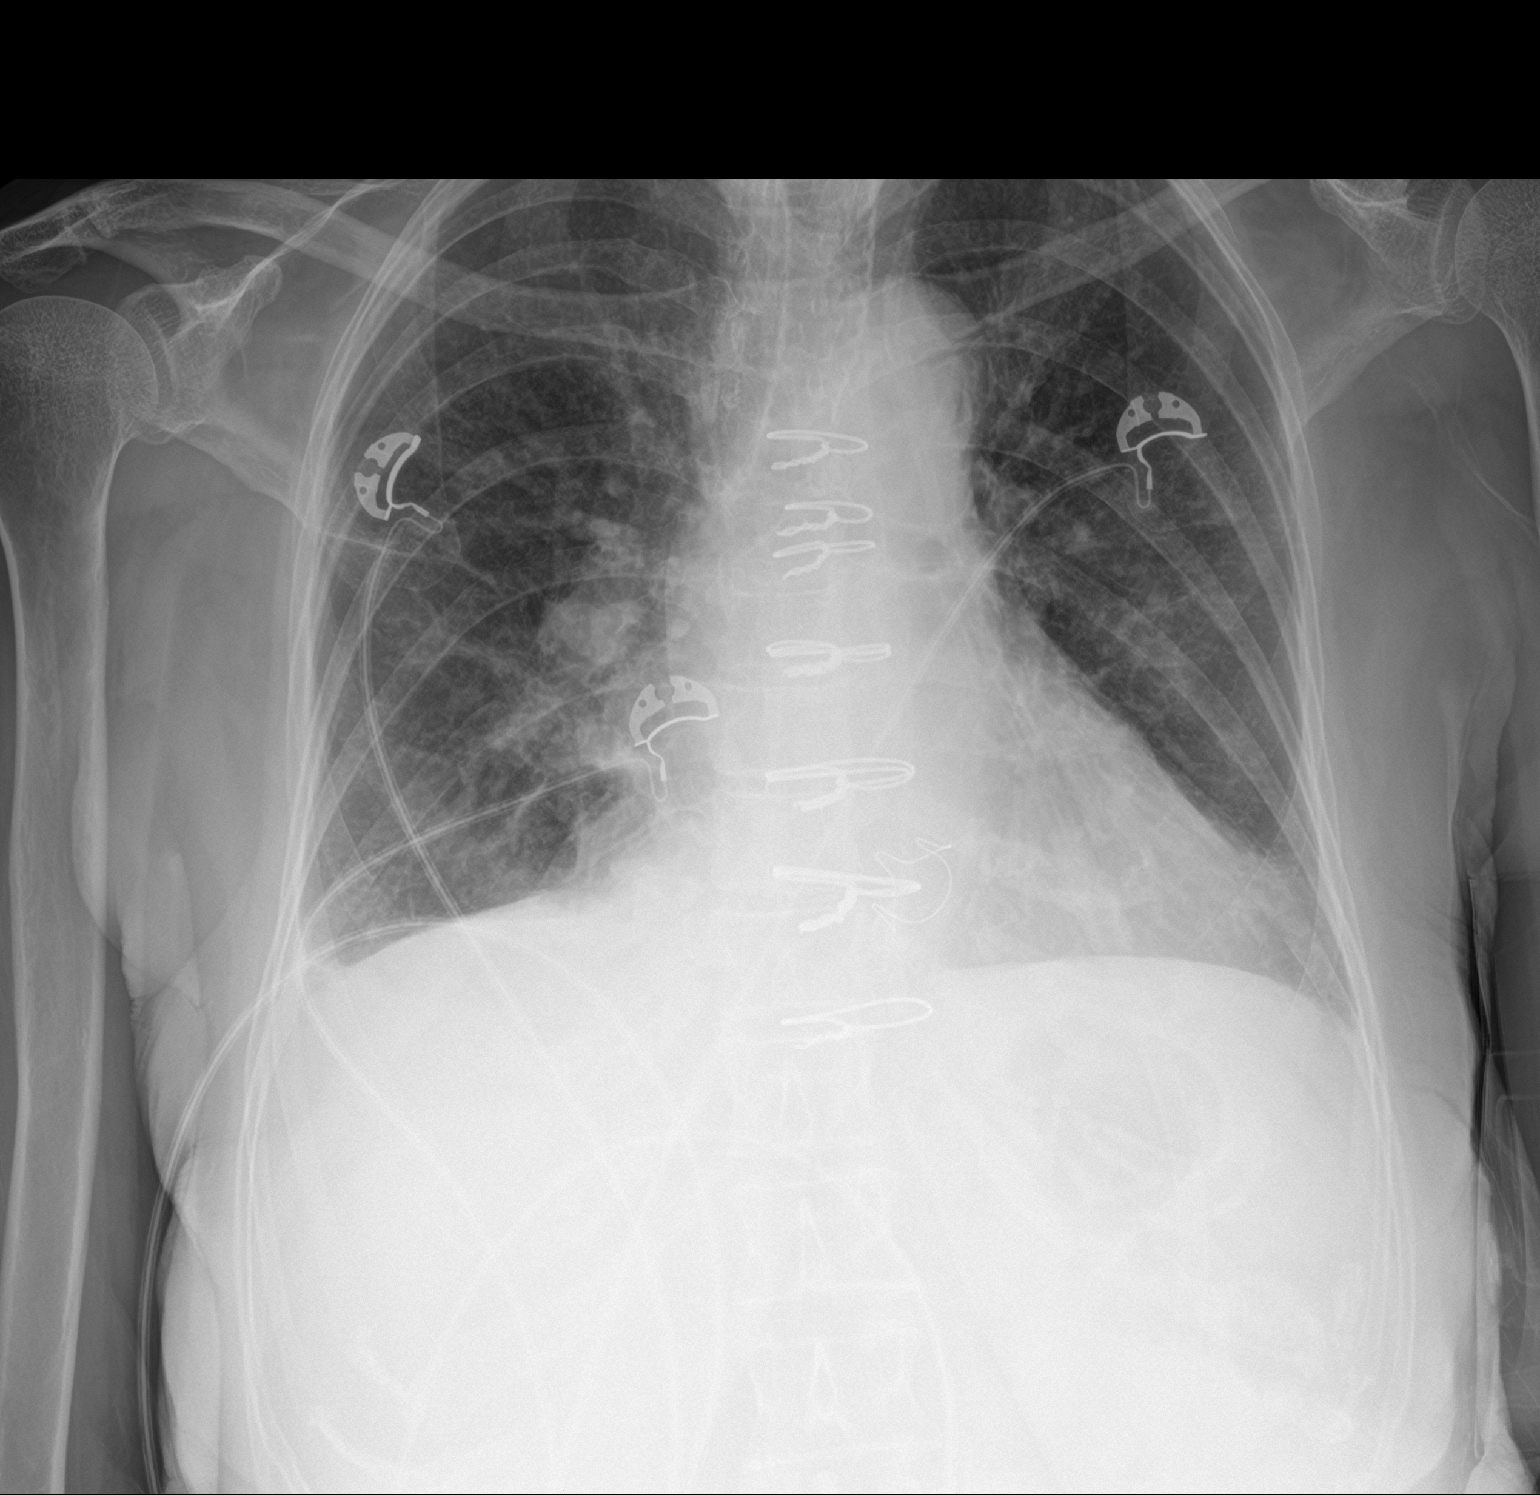

[2 of 2 positions shown; findings below may reference images not displayed]

FINDINGS: Median sternotomy and aortic valve replacement. Heart size is
normal. There is mild perihilar peribronchial thickening and
coarsened interstitial markings, stable in appearance. Small RIGHT
pleural effusion, similar to previous exam. Interval resolution of
small LEFT pleural effusion. There is atelectasis or early
infiltrate in the RIGHT middle lobe.
IMPRESSION: RIGHT middle lobe atelectasis or infiltrate.

Small RIGHT pleural effusion.

## 2022-09-17 NOTE — Telephone Encounter (Signed)
I s/w the pt and advised she had been cleared 8 days ago by Sande Rives, [facility], notes were sent to DS office at that time. I did review the medication recommendations with her. She is aware she does not need to hold Eliquis; however; she will need to take ABX. Pt has Amoxicillin on hand and will follow the directions I have given today; take 4 tablets 1 hour prior to her dental appt. Pt has given verbal understanding to medication instructions. I assured the pt that I will fax over to Dr. Sue Lush, [place] office again to be sure they did receive notes. Pt said thank you.

## 2022-09-17 NOTE — Telephone Encounter (Signed)
Pt calling to f/u on Clearance. Please advise

## 2022-11-21 DIAGNOSIS — I4891 Unspecified atrial fibrillation: Secondary | ICD-10-CM | POA: Diagnosis not present

## 2022-11-21 DIAGNOSIS — I272 Pulmonary hypertension, unspecified: Secondary | ICD-10-CM | POA: Diagnosis not present

## 2022-11-21 DIAGNOSIS — N2581 Secondary hyperparathyroidism of renal origin: Secondary | ICD-10-CM | POA: Diagnosis not present

## 2022-11-21 DIAGNOSIS — N1832 Chronic kidney disease, stage 3b: Secondary | ICD-10-CM | POA: Diagnosis not present

## 2022-11-26 ENCOUNTER — Telehealth: Payer: Self-pay

## 2022-11-26 NOTE — Patient Outreach (Signed)
  Care Coordination   11/26/2022 Name: [name] [name] MRN: [id number] DOB: August 05, 1941   Care Coordination Outreach Attempts:  An unsuccessful telephone outreach was attempted today to offer the patient information about available care coordination services as a benefit of their health plan.   Follow Up Plan:  Additional outreach attempts will be made to offer the patient care coordination information and services.   Encounter Outcome:  No Answer   Care Coordination Interventions:  No, not indicated    Rowe Pavy, RN, BSN, [facility] [employer] [phone]

## 2022-11-27 ENCOUNTER — Telehealth: Payer: Self-pay

## 2022-11-27 NOTE — Patient Outreach (Signed)
  Care Coordination   11/27/2022 Name: [name] [name] MRN: [id number] DOB: 1941/03/05   Care Coordination Outreach Attempts:  A second unsuccessful outreach was attempted today to offer the patient with information about available care coordination services as a benefit of their health plan.     Follow Up Plan:  Additional outreach attempts will be made to offer the patient care coordination information and services.   Encounter Outcome:  No Answer   Care Coordination Interventions:  No, not indicated    Rowe Pavy, RN, BSN, [facility] [employer] [phone]

## 2022-11-28 ENCOUNTER — Telehealth: Payer: Self-pay

## 2022-11-28 NOTE — Patient Outreach (Signed)
  Care Coordination   Initial Visit Note   11/28/2022 Name: [name] [name] MRN: [id number] DOB: 1941/04/22  [name] [name] is a 82 y.o. year old female who sees Buckner Malta, MD for primary care. I spoke with  [name] [name] by phone today.  What matters to the patients health and wellness today?  Placed call to patient today to review and offer [facility] care coordination program.  Patient reports that she is working with [name] [name] pharmacist with [employer].   Encouraged patient to let [name] [name] know if she has additional needs.   Active with [employer].    SDOH assessments and interventions completed:  No     Care Coordination Interventions:  No, not indicated   Follow up plan: No further intervention required.   Encounter Outcome:  Pt. Visit Completed   Rowe Pavy, RN, BSN, CEN [facility] Coordinator [phone]

## 2023-01-07 DIAGNOSIS — B5801 Toxoplasma chorioretinitis: Secondary | ICD-10-CM | POA: Diagnosis not present

## 2023-01-07 DIAGNOSIS — H35383 Toxic maculopathy, bilateral: Secondary | ICD-10-CM | POA: Diagnosis not present

## 2023-01-08 DIAGNOSIS — S8001XA Contusion of right knee, initial encounter: Secondary | ICD-10-CM | POA: Diagnosis not present

## 2023-01-08 DIAGNOSIS — M25561 Pain in right knee: Secondary | ICD-10-CM | POA: Diagnosis not present

## 2023-01-23 DIAGNOSIS — M25561 Pain in right knee: Secondary | ICD-10-CM | POA: Diagnosis not present

## 2023-01-24 ENCOUNTER — Ambulatory Visit
Admission: RE | Admit: 2023-01-24 | Discharge: 2023-01-24 | Disposition: A | Discharge status: Home / Self Care | Payer: Medicare Other | Source: Ambulatory Visit | Attending: Medical | Admitting: Medical

## 2023-01-24 ENCOUNTER — Other Ambulatory Visit: Payer: Self-pay | Admitting: Medical

## 2023-01-24 DIAGNOSIS — M25561 Pain in right knee: Secondary | ICD-10-CM

## 2023-01-24 DIAGNOSIS — M25461 Effusion, right knee: Secondary | ICD-10-CM | POA: Diagnosis not present

## 2023-01-30 DIAGNOSIS — M25561 Pain in right knee: Secondary | ICD-10-CM | POA: Diagnosis not present

## 2023-02-03 DIAGNOSIS — H6123 Impacted cerumen, bilateral: Secondary | ICD-10-CM | POA: Diagnosis not present

## 2023-02-18 DIAGNOSIS — M25561 Pain in right knee: Secondary | ICD-10-CM | POA: Diagnosis not present

## 2023-03-25 DIAGNOSIS — Z6822 Body mass index (BMI) 22.0-22.9, adult: Secondary | ICD-10-CM | POA: Diagnosis not present

## 2023-03-25 DIAGNOSIS — M25532 Pain in left wrist: Secondary | ICD-10-CM | POA: Diagnosis not present

## 2023-04-09 DIAGNOSIS — M25532 Pain in left wrist: Secondary | ICD-10-CM | POA: Diagnosis not present

## 2023-04-09 DIAGNOSIS — Z6822 Body mass index (BMI) 22.0-22.9, adult: Secondary | ICD-10-CM | POA: Diagnosis not present

## 2023-04-10 DIAGNOSIS — M25532 Pain in left wrist: Secondary | ICD-10-CM | POA: Diagnosis not present

## 2023-04-15 ENCOUNTER — Encounter: Payer: Self-pay | Admitting: Cardiovascular Disease

## 2023-04-15 ENCOUNTER — Ambulatory Visit: Payer: Medicare Other | Attending: Cardiovascular Disease | Admitting: Cardiovascular Disease

## 2023-04-15 VITALS — BP 136/66 | HR 61 | Ht 61.0 in | Wt 115.0 lb

## 2023-04-15 DIAGNOSIS — Z8679 Personal history of other diseases of the circulatory system: Secondary | ICD-10-CM

## 2023-04-15 DIAGNOSIS — E782 Mixed hyperlipidemia: Secondary | ICD-10-CM

## 2023-04-15 DIAGNOSIS — R0789 Other chest pain: Secondary | ICD-10-CM | POA: Diagnosis not present

## 2023-04-15 DIAGNOSIS — I480 Paroxysmal atrial fibrillation: Secondary | ICD-10-CM | POA: Diagnosis not present

## 2023-04-15 DIAGNOSIS — I484 Atypical atrial flutter: Secondary | ICD-10-CM

## 2023-04-15 NOTE — Assessment & Plan Note (Signed)
History of PAF status post cardioversion in the past and ablation maintaining sinus rhythm on Eliquis oral anticoagulation.

## 2023-04-15 NOTE — Assessment & Plan Note (Signed)
History of hyperlipidemia on pravastatin with lipid profile performed 05/21/2022 revealing total cholesterol 150

## 2023-04-15 NOTE — Patient Instructions (Signed)
Medication Instructions:  Your physician recommends that you continue on your current medications as directed. Please refer to the Current Medication list given to you today.  *If you need a refill on your cardiac medications before your next appointment, please call your pharmacy*   Testing/Procedures: Your physician has requested that you have an echocardiogram. Echocardiography is a painless test that uses sound waves to create images of your heart. It provides your doctor with information about the size and shape of your heart and how well your heart's chambers and valves are working. This procedure takes approximately one hour. There are no restrictions for this procedure. Please do NOT wear cologne, perfume, aftershave, or lotions (deodorant is allowed). Please arrive 15 minutes prior to your appointment time. This will take place at [address] [place] **To do in October**    Follow-Up: At [facility], you and your health needs are our priority.  As part of our continuing mission to provide you with exceptional heart care, we have created designated Provider Care Teams.  These Care Teams include your primary Cardiologist (physician) and Advanced Practice Providers (APPs -  Physician Assistants and Nurse Practitioners) who all work together to provide you with the care you need, when you need it.  We recommend signing up for the patient portal called "MyChart".  Sign up information is provided on this After Visit Summary.  MyChart is used to connect with patients for Virtual Visits (Telemedicine).  Patients are able to view lab/test results, encounter notes, upcoming appointments, etc.  Non-urgent messages can be sent to your provider as well.   To learn more about what you can do with MyChart, go to [email or web address].    Your next appointment:   12 month(s)  Provider:   Nanetta Batty, MD

## 2023-04-15 NOTE — Assessment & Plan Note (Signed)
History of porcine AVR in 2000 and wellness thoracic aortic aneurysm resection and grafting with a 30 mm Hemashield straight graft.  Because of moderate AS and moderate or greater AI and symptoms I referred her to the structural heart clinic where Dr. Clifton James and [facility] performed TAVR with valve in valve 06/19/2021 with a SAPIEN Resilia 23 mm valve.  Her symptoms were resolved.  Most recent 2D echo performed 05/24/2022 revealed normal LV systolic function with a well-functioning aortic bioprosthesis.  This will be followed on an annual basis.

## 2023-04-15 NOTE — Progress Notes (Signed)
04/15/2023 [name] [name]   September 28, 1940  [id number]  Primary Physician Buckner Malta, MD Primary Cardiologist: Runell Gess MD Nicholes Calamity, [name]  HPI:  [name] [name] is a 82 y.o.   thin-appearing, married  female, mother of 1, grandmother to 1 grandchild who I last saw in the office 03/05/2022.[name] [name]  She underwent re-do sternotomy and ascending fusiform thoracic aortic aneurysm resection and grafting using a 30-mm Hemashield straight graft. Her prosthetic valve which was placed 10 years ago (January 2004) was resuspended. I had cath'd her June 18, 2012, revealing normal coronary arteries, normal LV function and normal right heart pressures. Her postop course was uncomplicated except for some minor A-fib/flutter which converted to sinus rhythm prior to discharge. She recovered nicely from her descending thoracic aortic aneurysm resection and grafting. I last saw her 11/24/15.[name] KitchenShe had 2 episodes of worrisome chest pain prior to that visit which were nitrate responsive. The pain radiated  into her neck shoulders and back as well. A Myoview stress test performed 12/01/15 was low risk and not ischemic. A 2-D echo showed normal LV size and function, mild LVH with moderate aortic insufficiency. She's had no recurrent chest pain. She was diagnosed with gastric ulcers thought to be related to her low dose aspirin which has been discontinued.   Her husband of 59 years unfortunately died 08-15-2018 after a prolonged hospitalization and recovery from stroke and heart attack.  She currently lives alone.  She gets occasional atypical chest pain.  She denies shortness of breath.  2D echo performed 12/16/2018 revealed normal LV systolic function with moderate aortic insufficiency and increase in her mean aortic valve gradient from 13 to 21 mmHg.  The ascending graft was visualized as well.   She was seen in the A. fib clinic by Alphonzo Severance, placed on Eliquis and underwent successful outpatient DC  cardioversion by Dr. Mayford Knife on 06/05/2020. Her last EKG performed 06/12/2020 revealed sinus rhythm.  She has since been started on amiodarone and is maintaining sinus rhythm.  She saw Gillian Shields in the office 02/19/2021 with increasing shortness of breath.  She is on torsemide.  Recent 2D echo performed 03/06/2021 revealed normal LV systolic function, grade 2 diastolic dysfunction with moderate AS and moderate or greater AI.  I believe that her symptoms are related to her prosthetic valve malfunction.  I referred her to Dr. Clifton James who did a right left heart cath revealing normal coronary arteries and ultimately performed valve in valve TAVR with Dr. Laneta Simmers 06/19/2021 with a SAPIEN Resilia 23 mm valve.  Her most recent 2D echo performed 02/11/2022 revealed normal LV size and function with a well-functioning aortic bioprosthesis and no AI.  In addition, she underwent a flutter ablation by Dr. Graciela Husbands 01/07/2022 currently maintaining sinus rhythm on low-dose Eliquis.  Since I saw her a year ago she is remained stable.  She is maintaining sinus rhythm on Eliquis.  Her 2D echo performed 05/24/2022 revealed normal LV systolic function with a well-functioning aortic bioprosthesis.  She is asymptomatic.  Current Meds  Medication Sig   alendronate (FOSAMAX) 70 MG tablet Take 70 mg by mouth every Thursday.   amoxicillin (AMOXIL) 500 MG tablet Take 2000 mg (FOUR TABLETS) ONE HOUR BEFORE any dental appointments   apixaban (ELIQUIS) 2.5 MG TABS tablet TAKE 1 TABLET BY MOUTH TWICE A DAY   azelastine (ASTELIN) 0.1 % nasal spray Place 2 sprays into both nostrils daily as needed for allergies.   b complex vitamins capsule Take  1 capsule by mouth daily.   Calcium Carbonate-Vit D-Min (CALTRATE MINIS PLUS MINERALS PO) Take 2 tablets by mouth every morning.   cholecalciferol (VITAMIN D) 25 MCG (1000 UNIT) tablet Take 1,000 Units by mouth every evening.   FARXIGA 10 MG TABS tablet Take 10 mg by mouth daily.    ipratropium-albuterol (DUONEB) 0.5-2.5 (3) MG/3ML SOLN Inhale 3 mLs into the lungs every 6 (six) hours as needed (shortness of breath or wheezing).   Multiple Vitamins-Minerals (PRESERVISION AREDS PO) Take 1 tablet by mouth daily.   pantoprazole (PROTONIX) 40 MG tablet TAKE ONE TABLET BY MOUTH ONCE DAILY BEFORE meal of choice   Polyethyl Glycol-Propyl Glycol (SYSTANE) 0.4-0.3 % SOLN Place 1 drop into both eyes 2 (two) times daily as needed (dry eyes).   pravastatin (PRAVACHOL) 10 MG tablet Take 10 mg by mouth every evening.   SUMAtriptan (IMITREX) 100 MG tablet Take 100 mg by mouth every 2 (two) hours as needed for migraine. May repeat in 2 hours if headache persists or recurs.   [DISCONTINUED] multivitamin-lutein (OCUVITE-LUTEIN) CAPS capsule Take 1 capsule by mouth daily.     Allergies  Allergen Reactions   Other Nausea And Vomiting    Headache also   Artificial sweetners   Codeine Nausea And Vomiting    Social History   Socioeconomic History   Marital status: Widowed    Spouse name: Not on file   Number of children: 1   Years of education: Not on file   Highest education level: Not on file  Occupational History   Occupation: Retired-realtor  Tobacco Use   Smoking status: Never   Smokeless tobacco: Never  Vaping Use   Vaping status: Never Used  Substance and Sexual Activity   Alcohol use: No   Drug use: No   Sexual activity: Not Currently  Other Topics Concern   Not on file  Social History Narrative   Not on file   Social Determinants of Health   Financial Resource Strain: Not on file  Food Insecurity: Not on file  Transportation Needs: Not on file  Physical Activity: Not on file  Stress: Not on file  Social Connections: Not on file  Intimate Partner Violence: Not on file     Review of Systems: General: negative for chills, fever, night sweats or weight changes.  Cardiovascular: negative for chest pain, dyspnea on exertion, edema, orthopnea, palpitations,  paroxysmal nocturnal dyspnea or shortness of breath Dermatological: negative for rash Respiratory: negative for cough or wheezing Urologic: negative for hematuria Abdominal: negative for nausea, vomiting, diarrhea, bright red blood per rectum, melena, or hematemesis Neurologic: negative for visual changes, syncope, or dizziness All other systems reviewed and are otherwise negative except as noted above.    Blood pressure 136/66, pulse 61, height 5\' 1"  (1.549 m), weight 115 lb (52.2 kg).  General appearance: alert and no distress Neck: no adenopathy, no carotid bruit, no JVD, supple, symmetrical, trachea midline, and thyroid not enlarged, symmetric, no tenderness/mass/nodules Lungs: clear to auscultation bilaterally Heart: Regular rate and rhythm without murmurs gallops rubs or clicks Extremities: extremities normal, atraumatic, no cyanosis or edema Pulses: 2+ and symmetric Skin: Skin color, texture, turgor normal. No rashes or lesions Neurologic: Grossly normal  EKG normal sinus rhythm with LVH voltage and septal Q waves.  I personally reviewed this EKG.      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  Because of moderate AS and moderate or greater AI and symptoms I referred her to the structural heart clinic where Dr. Clifton James and [facility] performed TAVR with valve in valve 06/19/2021 with a SAPIEN Resilia 23 mm valve.  Her symptoms were resolved.  Most recent 2D echo performed 05/24/2022 revealed normal LV systolic function with a well-functioning aortic bioprosthesis.  This will be followed on an annual basis.  Paroxysmal atrial fibrillation (HCC) History of PAF status post cardioversion in the past and ablation maintaining sinus rhythm on Eliquis oral anticoagulation.  Hyperlipidemia History of hyperlipidemia on pravastatin with lipid  profile performed 05/21/2022 revealing total cholesterol 150     Runell Gess MD [facility], [facility] 04/15/2023 11:54 AM

## 2023-04-19 DIAGNOSIS — Z23 Encounter for immunization: Secondary | ICD-10-CM | POA: Diagnosis not present

## 2023-04-29 DIAGNOSIS — M7712 Lateral epicondylitis, left elbow: Secondary | ICD-10-CM | POA: Diagnosis not present

## 2023-05-14 ENCOUNTER — Ambulatory Visit (HOSPITAL_COMMUNITY): Payer: Medicare Other | Attending: Cardiovascular Disease

## 2023-05-14 DIAGNOSIS — I480 Paroxysmal atrial fibrillation: Secondary | ICD-10-CM | POA: Diagnosis not present

## 2023-05-14 DIAGNOSIS — R0789 Other chest pain: Secondary | ICD-10-CM | POA: Insufficient documentation

## 2023-05-14 DIAGNOSIS — I484 Atypical atrial flutter: Secondary | ICD-10-CM | POA: Insufficient documentation

## 2023-05-14 DIAGNOSIS — Z952 Presence of prosthetic heart valve: Secondary | ICD-10-CM

## 2023-05-14 DIAGNOSIS — Z8679 Personal history of other diseases of the circulatory system: Secondary | ICD-10-CM | POA: Diagnosis not present

## 2023-05-14 LAB — ECHOCARDIOGRAM COMPLETE
AR max vel: 1.62 cm2
AV Area VTI: 1.7 cm2
AV Area mean vel: 1.5 cm2
AV Mean grad: 13.5 mm[Hg]
AV Peak grad: 24.5 mm[Hg]
Ao pk vel: 2.48 m/s
Area-P 1/2: 3.27 cm2
S' Lateral: 2.6 cm

## 2023-05-15 ENCOUNTER — Other Ambulatory Visit (HOSPITAL_COMMUNITY): Payer: Self-pay

## 2023-05-15 DIAGNOSIS — Z952 Presence of prosthetic heart valve: Secondary | ICD-10-CM

## 2023-05-15 DIAGNOSIS — E782 Mixed hyperlipidemia: Secondary | ICD-10-CM

## 2023-05-15 DIAGNOSIS — I10 Essential (primary) hypertension: Secondary | ICD-10-CM

## 2023-05-15 DIAGNOSIS — I5032 Chronic diastolic (congestive) heart failure: Secondary | ICD-10-CM

## 2023-05-26 DIAGNOSIS — I272 Pulmonary hypertension, unspecified: Secondary | ICD-10-CM | POA: Diagnosis not present

## 2023-05-26 DIAGNOSIS — R7302 Impaired glucose tolerance (oral): Secondary | ICD-10-CM | POA: Diagnosis not present

## 2023-05-26 DIAGNOSIS — N1832 Chronic kidney disease, stage 3b: Secondary | ICD-10-CM | POA: Diagnosis not present

## 2023-05-26 DIAGNOSIS — Z0000 Encounter for general adult medical examination without abnormal findings: Secondary | ICD-10-CM | POA: Diagnosis not present

## 2023-05-26 DIAGNOSIS — Z79899 Other long term (current) drug therapy: Secondary | ICD-10-CM | POA: Diagnosis not present

## 2023-05-26 DIAGNOSIS — N2581 Secondary hyperparathyroidism of renal origin: Secondary | ICD-10-CM | POA: Diagnosis not present

## 2023-05-26 DIAGNOSIS — Z23 Encounter for immunization: Secondary | ICD-10-CM | POA: Diagnosis not present

## 2023-05-26 DIAGNOSIS — E785 Hyperlipidemia, unspecified: Secondary | ICD-10-CM | POA: Diagnosis not present

## 2023-05-26 DIAGNOSIS — I4891 Unspecified atrial fibrillation: Secondary | ICD-10-CM | POA: Diagnosis not present

## 2023-06-26 DIAGNOSIS — L578 Other skin changes due to chronic exposure to nonionizing radiation: Secondary | ICD-10-CM | POA: Diagnosis not present

## 2023-06-26 DIAGNOSIS — L821 Other seborrheic keratosis: Secondary | ICD-10-CM | POA: Diagnosis not present

## 2023-07-31 DIAGNOSIS — R051 Acute cough: Secondary | ICD-10-CM | POA: Diagnosis not present

## 2023-07-31 DIAGNOSIS — R070 Pain in throat: Secondary | ICD-10-CM | POA: Diagnosis not present

## 2023-07-31 DIAGNOSIS — R52 Pain, unspecified: Secondary | ICD-10-CM | POA: Diagnosis not present

## 2023-08-07 DIAGNOSIS — N184 Chronic kidney disease, stage 4 (severe): Secondary | ICD-10-CM | POA: Diagnosis not present

## 2023-08-13 DIAGNOSIS — I129 Hypertensive chronic kidney disease with stage 1 through stage 4 chronic kidney disease, or unspecified chronic kidney disease: Secondary | ICD-10-CM | POA: Diagnosis not present

## 2023-08-13 DIAGNOSIS — I350 Nonrheumatic aortic (valve) stenosis: Secondary | ICD-10-CM | POA: Diagnosis not present

## 2023-08-13 DIAGNOSIS — E559 Vitamin D deficiency, unspecified: Secondary | ICD-10-CM | POA: Diagnosis not present

## 2023-08-13 DIAGNOSIS — D631 Anemia in chronic kidney disease: Secondary | ICD-10-CM | POA: Diagnosis not present

## 2023-08-13 DIAGNOSIS — N2581 Secondary hyperparathyroidism of renal origin: Secondary | ICD-10-CM | POA: Diagnosis not present

## 2023-08-13 DIAGNOSIS — N1832 Chronic kidney disease, stage 3b: Secondary | ICD-10-CM | POA: Diagnosis not present

## 2023-08-19 DIAGNOSIS — H903 Sensorineural hearing loss, bilateral: Secondary | ICD-10-CM | POA: Diagnosis not present

## 2023-08-25 ENCOUNTER — Other Ambulatory Visit: Payer: Self-pay | Admitting: Cardiovascular Disease

## 2023-10-03 ENCOUNTER — Telehealth: Payer: Self-pay | Admitting: Pharmacist

## 2023-10-03 NOTE — Progress Notes (Signed)
 Attempted to contact patient for scheduled appointment for medication access re: Eliquis. Left HIPAA compliant message for patient to return my call at their convenience.    Reynold Bowen, PharmD Clinical Pharmacist [place] Direct Dial: [phone]

## 2023-10-03 NOTE — Progress Notes (Signed)
   10/03/2023  Patient ID: [name] [name], female   DOB: 1940-10-15, 83 y.o.   MRN: [id number]  [name] [name] returned my telephone call and scheduled appointment for Monday at 11 AM to meet and complete Eliquis application.   Reynold Bowen, PharmD Clinical Pharmacist [place] Direct Dial: [phone]

## 2023-10-10 ENCOUNTER — Telehealth: Payer: Self-pay | Admitting: Pharmacist

## 2023-10-10 NOTE — Progress Notes (Signed)
   10/10/2023  Patient ID: [name] [name], female   DOB: 03-23-1941, 83 y.o.   MRN: [id number]  Patient presented to [facility] for signing of patient assistance form for [employer] for Eliquis 2.5 mg 1 tablet PO BID. Coordinated new script to [facility] per patient's request.   Will send to [facility] patient advocate for submission of application and follow up.   Reynold Bowen, PharmD Clinical Pharmacist [place] Direct Dial: [phone]

## 2023-10-13 ENCOUNTER — Telehealth: Payer: Self-pay

## 2023-10-13 ENCOUNTER — Other Ambulatory Visit (HOSPITAL_COMMUNITY): Payer: Self-pay

## 2023-10-13 NOTE — Telephone Encounter (Signed)
 PAP: Application for Eliquis has been submitted to [employer] ([employer]), via fax

## 2023-10-21 ENCOUNTER — Telehealth: Payer: Self-pay | Admitting: Pharmacist

## 2023-10-21 NOTE — Progress Notes (Signed)
   10/21/2023  Patient ID: [name] [name], female   DOB: 1941/05/07, 83 y.o.   MRN: [id number]  Telephonic outreach to [employer] re: second fax notification regarding Eliquis missing information. Spoke with Victorino Dike, issue was resolved with corrected fax sent by Harriett Sine on 10/16/2023. Patient application is back in processing as this time. Discrepancy between patient's reported income and electronically verified income. Per representative, Onalee Hua, patient may submit a 2023 1040 or SSA 1099. Patient will bring me a copy of items this week along with 2025 OOP expenses so far.   Reynold Bowen, PharmD Clinical Pharmacist [place] Direct Dial: [phone]

## 2023-10-29 NOTE — Telephone Encounter (Signed)
 Spoke with [place] at [place] to check on status of application for patient assistance for Eliquis.  Patient is required to spend $2000 out of pocket before being approved.  Alver Fisher estimated that would be November.  Patient has been denied for now.  Will call back around [place].

## 2024-01-27 DIAGNOSIS — B5801 Toxoplasma chorioretinitis: Secondary | ICD-10-CM | POA: Diagnosis not present

## 2024-02-16 DIAGNOSIS — H6123 Impacted cerumen, bilateral: Secondary | ICD-10-CM | POA: Diagnosis not present

## 2024-03-03 ENCOUNTER — Other Ambulatory Visit: Payer: Self-pay | Admitting: Cardiovascular Disease

## 2024-03-04 NOTE — Telephone Encounter (Signed)
 Submitted out of pocket to [employer]

## 2024-03-11 NOTE — Telephone Encounter (Signed)
 Spoke with [employer].  Patient has still not mett oop expenses for patient assistance for Elioquis.  The rep said she would be eligible in October if she continues to submit oop.

## 2024-03-29 ENCOUNTER — Telehealth: Payer: Self-pay | Admitting: Pharmacist

## 2024-03-29 NOTE — Progress Notes (Signed)
   03/29/2024  Patient ID: Montel JULIANNA Poet, female   DOB: 11-Feb-1941, 83 y.o.   MRN: [id number]  Telephonic engagement with Mrs. Runnels regarding denial from [employer] for patient assistance with Eliquis  2.5 mg PO BID for atrial fibrillation. I have advised Mrs. Sonnenberg that the [employer] program has changed the eligibility requirements making it challenmging to get approval for patients in 2025. I advised Mrs. Tetterton to discuss Xarelto with her Cardiologist and if patient and provider are agreeable, we can apply for Xarelto. Patient appreciative of call and plans to discus with her Cardiologist in October.   Annabella Galla, PharmD Clinical Pharmacist [place] Direct Dial: [phone]

## 2024-04-14 ENCOUNTER — Other Ambulatory Visit: Payer: Self-pay | Admitting: Cardiovascular Disease

## 2024-04-14 DIAGNOSIS — I480 Paroxysmal atrial fibrillation: Secondary | ICD-10-CM

## 2024-04-14 DIAGNOSIS — Z8679 Personal history of other diseases of the circulatory system: Secondary | ICD-10-CM

## 2024-04-14 DIAGNOSIS — R0789 Other chest pain: Secondary | ICD-10-CM

## 2024-04-14 DIAGNOSIS — I484 Atypical atrial flutter: Secondary | ICD-10-CM

## 2024-04-15 ENCOUNTER — Encounter: Payer: Self-pay | Admitting: Cardiovascular Disease

## 2024-04-23 DIAGNOSIS — Z23 Encounter for immunization: Secondary | ICD-10-CM | POA: Diagnosis not present

## 2024-05-12 ENCOUNTER — Telehealth: Payer: Self-pay

## 2024-05-12 NOTE — Telephone Encounter (Signed)
 PAP: Patient assistance application for Farxiga  through [employer] ([employer]) has been mailed to pt's home address on file. Provider portion of application will be faxed to provider's office.   Provider portion of the application will be faxed to Dr. Delon Contes

## 2024-05-18 ENCOUNTER — Ambulatory Visit: Payer: Self-pay | Admitting: Cardiovascular Disease

## 2024-05-18 ENCOUNTER — Ambulatory Visit (HOSPITAL_COMMUNITY)
Admission: RE | Admit: 2024-05-18 | Discharge: 2024-05-18 | Disposition: A | Discharge status: Home / Self Care | Source: Ambulatory Visit | Attending: Internal Medicine | Admitting: Internal Medicine

## 2024-05-18 DIAGNOSIS — Z8679 Personal history of other diseases of the circulatory system: Secondary | ICD-10-CM | POA: Insufficient documentation

## 2024-05-18 DIAGNOSIS — I480 Paroxysmal atrial fibrillation: Secondary | ICD-10-CM | POA: Insufficient documentation

## 2024-05-18 DIAGNOSIS — R0789 Other chest pain: Secondary | ICD-10-CM | POA: Insufficient documentation

## 2024-05-18 DIAGNOSIS — I484 Atypical atrial flutter: Secondary | ICD-10-CM | POA: Insufficient documentation

## 2024-05-18 LAB — ECHOCARDIOGRAM COMPLETE
AR max vel: 1.12 cm2
AV Area VTI: 1.16 cm2
AV Area mean vel: 1.12 cm2
AV Mean grad: 9 mmHg
AV Peak grad: 17 mmHg
Ao pk vel: 2.06 m/s
Area-P 1/2: 3.29 cm2
S' Lateral: 2.7 cm

## 2024-05-24 ENCOUNTER — Encounter: Payer: Self-pay | Admitting: Cardiovascular Disease

## 2024-05-24 ENCOUNTER — Ambulatory Visit: Attending: Cardiovascular Disease | Admitting: Cardiovascular Disease

## 2024-05-24 VITALS — BP 150/80 | HR 64 | Ht 61.0 in | Wt 119.0 lb

## 2024-05-24 DIAGNOSIS — Z8679 Personal history of other diseases of the circulatory system: Secondary | ICD-10-CM | POA: Insufficient documentation

## 2024-05-24 DIAGNOSIS — I5032 Chronic diastolic (congestive) heart failure: Secondary | ICD-10-CM | POA: Diagnosis present

## 2024-05-24 DIAGNOSIS — I484 Atypical atrial flutter: Secondary | ICD-10-CM | POA: Diagnosis present

## 2024-05-24 DIAGNOSIS — E782 Mixed hyperlipidemia: Secondary | ICD-10-CM | POA: Insufficient documentation

## 2024-05-24 DIAGNOSIS — I480 Paroxysmal atrial fibrillation: Secondary | ICD-10-CM | POA: Diagnosis not present

## 2024-05-24 NOTE — Patient Instructions (Signed)
 Medication Instructions:  Your physician recommends that you continue on your current medications as directed. Please refer to the Current Medication list given to you today.  *If you need a refill on your cardiac medications before your next appointment, please call your pharmacy*   Testing/Procedures: Your physician has requested that you have an echocardiogram. Echocardiography is a painless test that uses sound waves to create images of your heart. It provides your doctor with information about the size and shape of your heart and how well your heart's chambers and valves are working. This procedure takes approximately one hour. There are no restrictions for this procedure. Please do NOT wear cologne, perfume, aftershave, or lotions (deodorant is allowed). Please arrive 15 minutes prior to your appointment time.  Please note: We ask at that you not bring children with you during ultrasound (echo/ vascular) testing. Due to room size and safety concerns, children are not allowed in the ultrasound rooms during exams. Our front office staff cannot provide observation of children in our lobby area while testing is being conducted. An adult accompanying a patient to their appointment will only be allowed in the ultrasound room at the discretion of the ultrasound technician under special circumstances. We apologize for any inconvenience. **To do in October 2026**   Follow-Up: At [facility], you and your health needs are our priority.  As part of our continuing mission to provide you with exceptional heart care, our providers are all part of one team.  This team includes your primary Cardiologist (physician) and Advanced Practice Providers or APPs (Physician Assistants and Nurse Practitioners) who all work together to provide you with the care you need, when you need it.  Your next appointment:   12 month(s)  Provider:   Dorn Lesches, MD

## 2024-05-24 NOTE — Assessment & Plan Note (Signed)
 History of ascending thoracic aortic aneurysm resection and grafting using a 30 mm Hemashield straight graft January 2004 with valve resuspension.  She underwent TAVR by Drs. Mcalhany and Bartle 06/19/2021 with a SAPIEN Resilia 23 mm valve.  Her most recent 2D echo performed 05/18/2024 revealed normal LV systolic function with a well-functioning aortic bioprosthesis in the aortic root measuring 38 mm.  This to be repeated on an annual basis.

## 2024-05-24 NOTE — Assessment & Plan Note (Signed)
 History of hyperlipidemia on low-dose pravastatin  followed by her PCP.  Her most recent lipid profile performed 05/26/2023 revealed total cholesterol 178.  She does have normal coronary arteries.

## 2024-05-24 NOTE — Assessment & Plan Note (Signed)
 History of PAF status post cardioversion by Dr. Shlomo 06/05/2020.  She had a flutter ablation by Dr. Fernande 01/07/2022.  She is on Eliquis  oral anticoagulation.  She is in A-fib today rate controlled and is asymptomatic.  I am going to refer her back to the A-fib clinic.  I do not think she needs to be cardioverted since she is asymptomatic.

## 2024-05-24 NOTE — Progress Notes (Signed)
 05/24/2024 [name] [name]   Jun 23, 1941  [id number]  Primary Physician [name] Nest, MD Primary Cardiologist: [name] [name] MD [name] [name], [name]  HPI:  [name] [name] is a 83 y.o.    thin-appearing, married  female, mother of 1, grandmother to 1 grandchild who I last saw in the office 04/15/2023.[name] [name]  She underwent re-do sternotomy and ascending fusiform thoracic aortic aneurysm resection and grafting using a 30-mm Hemashield straight graft. Her prosthetic valve which was placed 10 years ago (January 2004) was resuspended. I had cath'd her June 18, 2012, revealing normal coronary arteries, normal LV function and normal right heart pressures. Her postop course was uncomplicated except for some minor A-fib/flutter which converted to sinus rhythm prior to discharge. She recovered nicely from her descending thoracic aortic aneurysm resection and grafting. I last saw her 11/24/15.SABRAShe had 2 episodes of worrisome chest pain prior to that visit which were nitrate responsive. The pain radiated  into her neck shoulders and back as well. A Myoview stress test performed 12/01/15 was low risk and not ischemic. A 2-D echo showed normal LV size and function, mild LVH with moderate aortic insufficiency. She's had no recurrent chest pain. She was diagnosed with gastric ulcers thought to be related to her low dose aspirin  which has been discontinued.   Her husband of 59 years unfortunately died 09/11/18 after a prolonged hospitalization and recovery from stroke and heart attack.  She currently lives alone.  She gets occasional atypical chest pain.  She denies shortness of breath.  2D echo performed 12/16/2018 revealed normal LV systolic function with moderate aortic insufficiency and increase in her mean aortic valve gradient from 13 to 21 mmHg.  The ascending graft was visualized as well.   She was seen in the A. fib clinic by [name] [name], placed on Eliquis  and underwent successful outpatient DC  cardioversion by Dr. Shlomo on 06/05/2020. Her last EKG performed 06/12/2020 revealed sinus rhythm.  She has since been started on amiodarone  and is maintaining sinus rhythm.  She saw [name] [name] in the office 02/19/2021 with increasing shortness of breath.  She is on torsemide .  Recent 2D echo performed 03/06/2021 revealed normal LV systolic function, grade 2 diastolic dysfunction with moderate AS and moderate or greater AI.  I believe that her symptoms are related to her prosthetic valve malfunction.  I referred her to Dr. Verlin who did a right left heart cath revealing normal coronary arteries and ultimately performed valve in valve TAVR with Dr. Lucas 06/19/2021 with a SAPIEN Resilia 23 mm valve.  Her most recent 2D echo performed 02/11/2022 revealed normal LV size and function with a well-functioning aortic bioprosthesis and no AI.  In addition, she underwent a flutter ablation by Dr. Fernande 01/07/2022 currently maintaining sinus rhythm on low-dose Eliquis .   Since I saw her a year ago she is remained stable.  She currently is in A-fib with controlled ventricular response and is unaware of this.  She remains on Eliquis .  Her most recent 2D echo performed 10//25 revealed normal LV systolic function, normal diastolic parameters and a normal aortic bioprosthesis with a ascending thoracic aorta measuring 38 mm.   Current Meds  Medication Sig   alendronate (FOSAMAX) 70 MG tablet Take 70 mg by mouth every Thursday.   amoxicillin  (AMOXIL ) 500 MG tablet Take 2000 mg (FOUR TABLETS) ONE HOUR BEFORE any dental appointments   apixaban  (ELIQUIS ) 2.5 MG TABS tablet TAKE 1 TABLET BY MOUTH TWICE A DAY  azelastine (ASTELIN) 0.1 % nasal spray Place 2 sprays into both nostrils daily as needed for allergies.   b complex vitamins capsule Take 1 capsule by mouth daily.   Calcium Carbonate-Vit D-Min (CALTRATE MINIS PLUS MINERALS PO) Take 2 tablets by mouth every morning.   cholecalciferol (VITAMIN D) 25 MCG (1000  UNIT) tablet Take 1,000 Units by mouth every evening.   FARXIGA  10 MG TABS tablet Take 10 mg by mouth daily.   Multiple Vitamins-Minerals (PRESERVISION AREDS PO) Take 1 tablet by mouth daily.   pantoprazole  (PROTONIX ) 40 MG tablet TAKE ONE TABLET BY MOUTH ONCE DAILY BEFORE meal of choice   Polyethyl Glycol-Propyl Glycol (SYSTANE) 0.4-0.3 % SOLN Place 1 drop into both eyes 2 (two) times daily as needed (dry eyes).   pravastatin  (PRAVACHOL ) 10 MG tablet Take 10 mg by mouth every evening.   SUMAtriptan  (IMITREX ) 100 MG tablet Take 100 mg by mouth every 2 (two) hours as needed for migraine. May repeat in 2 hours if headache persists or recurs.     Allergies  Allergen Reactions   Other Nausea And Vomiting    Headache also   Artificial sweetners   Codeine Nausea And Vomiting    Social History   Socioeconomic History   Marital status: Widowed    Spouse name: Not on file   Number of children: 1   Years of education: Not on file   Highest education level: Not on file  Occupational History   Occupation: Retired-realtor  Tobacco Use   Smoking status: Never   Smokeless tobacco: Never  Vaping Use   Vaping status: Never Used  Substance and Sexual Activity   Alcohol  use: No   Drug use: No   Sexual activity: Not Currently  Other Topics Concern   Not on file  Social History Narrative   Not on file   Social Drivers of Health   Financial Resource Strain: Not on file  Food Insecurity: Not on file  Transportation Needs: Not on file  Physical Activity: Not on file  Stress: Not on file  Social Connections: Not on file  Intimate Partner Violence: Not on file     Review of Systems: General: negative for chills, fever, night sweats or weight changes.  Cardiovascular: negative for chest pain, dyspnea on exertion, edema, orthopnea, palpitations, paroxysmal nocturnal dyspnea or shortness of breath Dermatological: negative for rash Respiratory: negative for cough or wheezing Urologic:  negative for hematuria Abdominal: negative for nausea, vomiting, diarrhea, bright red blood per rectum, melena, or hematemesis Neurologic: negative for visual changes, syncope, or dizziness All other systems reviewed and are otherwise negative except as noted above.    Blood pressure (!) 150/80, pulse 64, height 5' 1 (1.549 m), weight 119 lb (54 kg), SpO2 98%.  General appearance: alert and no distress Neck: no adenopathy, no carotid bruit, no JVD, supple, symmetrical, trachea midline, and thyroid  not enlarged, symmetric, no tenderness/mass/nodules Lungs: clear to auscultation bilaterally Heart: irregularly irregular rhythm Extremities: extremities normal, atraumatic, no cyanosis or edema Pulses: 2+ and symmetric Skin: Skin color, texture, turgor normal. No rashes or lesions Neurologic: Grossly normal  EKG EKG Interpretation Date/Time:  Monday May 24 2024 10:52:13 EDT Ventricular Rate:  64 PR Interval:    QRS Duration:  92 QT Interval:  414 QTC Calculation: 427 R Axis:   -11  Text Interpretation: Atrial fibrillation with a competing junctional pacemaker Moderate voltage criteria for LVH, may be normal variant ( R in aVL , Cornell product ) Septal infarct , age  undetermined When compared with ECG of 07-Jan-2022 09:47, Atrial fibrillation has replaced Sinus rhythm Septal infarct is now Present Nonspecific T wave abnormality no longer evident in Inferior leads QT has shortened Confirmed by Court Carrier [phone]) on 05/24/2024 10:53:37 AM    ASSESSMENT AND PLAN:   H/O  AS, 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  She underwent TAVR by Drs. Mcalhany and Bartle 06/19/2021 with a SAPIEN Resilia 23 mm valve.  Her most recent 2D echo performed 05/18/2024 revealed normal LV systolic function with a well-functioning aortic bioprosthesis in the aortic root  measuring 38 mm.  This to be repeated on an annual basis.  Paroxysmal atrial fibrillation (HCC) History of PAF status post cardioversion by Dr. Shlomo 06/05/2020.  She had a flutter ablation by Dr. Fernande 01/07/2022.  She is on Eliquis  oral anticoagulation.  She is in A-fib today rate controlled and is asymptomatic.  I am going to refer her back to the A-fib clinic.  I do not think she needs to be cardioverted since she is asymptomatic.  Hyperlipidemia History of hyperlipidemia on low-dose pravastatin  followed by her PCP.  Her most recent lipid profile performed 05/26/2023 revealed total cholesterol 178.  She does have normal coronary arteries.     Carrier DOROTHA Court MD FACP,FACC,FAHA, [facility] 05/24/2024 11:07 AM

## 2024-05-27 ENCOUNTER — Other Ambulatory Visit: Payer: Self-pay

## 2024-05-27 DIAGNOSIS — N1832 Chronic kidney disease, stage 3b: Secondary | ICD-10-CM | POA: Diagnosis not present

## 2024-05-27 DIAGNOSIS — Z131 Encounter for screening for diabetes mellitus: Secondary | ICD-10-CM | POA: Diagnosis not present

## 2024-05-27 DIAGNOSIS — Z1331 Encounter for screening for depression: Secondary | ICD-10-CM | POA: Diagnosis not present

## 2024-05-27 DIAGNOSIS — M8589 Other specified disorders of bone density and structure, multiple sites: Secondary | ICD-10-CM | POA: Diagnosis not present

## 2024-05-27 DIAGNOSIS — I272 Pulmonary hypertension, unspecified: Secondary | ICD-10-CM | POA: Diagnosis not present

## 2024-05-27 DIAGNOSIS — E039 Hypothyroidism, unspecified: Secondary | ICD-10-CM | POA: Diagnosis not present

## 2024-05-27 DIAGNOSIS — E785 Hyperlipidemia, unspecified: Secondary | ICD-10-CM | POA: Diagnosis not present

## 2024-05-27 DIAGNOSIS — I4891 Unspecified atrial fibrillation: Secondary | ICD-10-CM | POA: Diagnosis not present

## 2024-05-27 DIAGNOSIS — Z0000 Encounter for general adult medical examination without abnormal findings: Secondary | ICD-10-CM | POA: Diagnosis not present

## 2024-05-28 NOTE — Telephone Encounter (Signed)
 Per patient: She renewed enrollment through text and has been approved through 2026

## 2024-06-04 ENCOUNTER — Ambulatory Visit (HOSPITAL_COMMUNITY): Admitting: Physician Assistant

## 2024-06-04 ENCOUNTER — Other Ambulatory Visit: Payer: Self-pay | Admitting: Cardiovascular Disease

## 2024-06-08 ENCOUNTER — Ambulatory Visit (HOSPITAL_COMMUNITY)
Admission: RE | Admit: 2024-06-08 | Discharge: 2024-06-08 | Disposition: A | Discharge status: Home / Self Care | Source: Ambulatory Visit | Attending: Physician Assistant | Admitting: Physician Assistant

## 2024-06-08 VITALS — BP 144/76 | HR 63 | Ht 61.0 in | Wt 116.4 lb

## 2024-06-08 DIAGNOSIS — I4891 Unspecified atrial fibrillation: Secondary | ICD-10-CM | POA: Insufficient documentation

## 2024-06-08 DIAGNOSIS — I4819 Other persistent atrial fibrillation: Secondary | ICD-10-CM | POA: Diagnosis not present

## 2024-06-08 DIAGNOSIS — D6869 Other thrombophilia: Secondary | ICD-10-CM | POA: Insufficient documentation

## 2024-06-08 DIAGNOSIS — I483 Typical atrial flutter: Secondary | ICD-10-CM | POA: Diagnosis not present

## 2024-06-08 NOTE — Progress Notes (Signed)
 Primary Care Physician: Clemmie Nest, MD Primary Cardiologist: Dr Court Primary Electrophysiologist: none Referring Physician: [facility] triage/Dr Court [name] [name] is a 83 y.o. female with a history of thoracic aortic aneurysm and AS s/p repair 2004, paroxysmal atrial fibrillation, atrial flutter who presents for follow up in the [facility]. The patient was initially diagnosed with atrial fibrillation in 2004 in the setting of her valve surgery. She had done well since that time with no known recurrence of afib.  Patient was in her usual state of health until 05/14/20 when she began having heart fluttering. These symptoms felt similar to her palpitations after her surgery. ECG 05/15/20 shows afib with RVR. There were no triggers that she could identify. She was started on Eliquis  for stroke prevention. Patient is s/p DCCV on 06/05/20.   Patient seen by Dr Court and symptoms of fluid retention and SOB were felt to be related to her valve disease. She underwent valve in valve TAVR on 06/19/21. She states that since the surgery she feels much better with resolution of her SOB. Patient is s/p DCCV on 08/09/21. She had recurrence of atrial flutter and is s/p CTI ablation 01/07/22 with Dr Fernande.   Patient returns for follow up for atrial fibrillation. She was seen by Dr Court on 05/24/24 and had an irregular rhythm. ECG at that time consistent with SR with frequent ectopy. She is in SR today and feels well. No bleeding issues on anticoagulation.   Today, she  denies symptoms of palpitations, chest pain, shortness of breath, orthopnea, PND, lower extremity edema, dizziness, presyncope, syncope, snoring, daytime somnolence, bleeding, or neurologic sequela. The patient is tolerating medications without difficulties and is otherwise without complaint today.    Atrial Fibrillation Risk Factors:  she does not have symptoms or diagnosis of sleep apnea. she does not have  a history of rheumatic fever. she does not have a history of alcohol  use.  Atrial Fibrillation Management history:  Previous antiarrhythmic drugs: amiodarone  Previous cardioversions: 2013, 06/05/20, 08/09/21 Previous ablations: 01/07/22 Anticoagulation history: Eliquis    Past Medical History:  Diagnosis Date   Anemia    Aortic root dilatation 05/11/2012   CTA CHEST   Arthritis    Cardiac asystole, post DCCV requiring use of temp. external pacemaker leads 07/19/2012   Chronic kidney disease    Stage 4   GERD (gastroesophageal reflux disease)    H/O aortic valve disorder 10/04/2002   Dr. 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 McAlhany & Bartle   Thoracic ascending aortic aneurysm    CTA CHEST 05/11/12    Current Outpatient Medications  Medication Sig Dispense Refill   alendronate (FOSAMAX) 70 MG tablet Take 70 mg by mouth every Thursday.     amoxicillin  (AMOXIL ) 500 MG tablet Take 2000 mg (FOUR TABLETS) ONE HOUR BEFORE any dental appointments 12 tablet 4   apixaban  (ELIQUIS ) 2.5 MG TABS tablet TAKE 1 TABLET BY MOUTH TWICE A DAY 60 tablet 5   azelastine (ASTELIN) 0.1 % nasal spray Place 2 sprays into both nostrils daily as needed for allergies.  b complex vitamins capsule Take 1 capsule by mouth daily.     Calcium Carbonate-Vit D-Min (CALTRATE MINIS PLUS MINERALS PO) Take 2 tablets by mouth every morning.     cholecalciferol (VITAMIN D) 25 MCG (1000 UNIT) tablet Take 1,000 Units by mouth every evening.     FARXIGA  10 MG TABS tablet Take 10 mg by mouth daily.     Multiple Vitamins-Minerals (PRESERVISION AREDS PO) Take 1 tablet by mouth daily.     pantoprazole   (PROTONIX ) 40 MG tablet TAKE ONE TABLET BY MOUTH ONCE DAILY BEFORE meal of choice 90 tablet 3   Polyethyl Glycol-Propyl Glycol (SYSTANE) 0.4-0.3 % SOLN Place 1 drop into both eyes 2 (two) times daily as needed (dry eyes).     pravastatin  (PRAVACHOL ) 10 MG tablet Take 10 mg by mouth every evening.     SUMAtriptan  (IMITREX ) 100 MG tablet Take 100 mg by mouth every 2 (two) hours as needed for migraine. May repeat in 2 hours if headache persists or recurs.     No current facility-administered medications for this encounter.    ROS- All systems are reviewed and negative except as per the HPI above.  Physical Exam: Vitals:   06/08/24 1030  BP: (!) 144/76  Pulse: 63  Weight: 52.8 kg  Height: 5' 1 (1.549 m)     GEN: Well nourished, well developed in no acute distress CARDIAC: Regular rate and rhythm, no rubs, gallops. Soft 1/6 murmur  RESPIRATORY:  Clear to auscultation without rales, wheezing or rhonchi  ABDOMEN: Soft, non-tender, non-distended EXTREMITIES:  No edema; No deformity    Wt Readings from Last 3 Encounters:  06/08/24 52.8 kg  05/24/24 54 kg  04/15/23 52.2 kg    EKG today demonstrates  SR, 1st degree AV block, PVC Vent. rate 63 BPM PR interval 212 ms QRS duration 88 ms QT/QTcB 410/419 ms   Echo 05/18/24 demonstrated   1. Left ventricular ejection fraction, by estimation, is 60 to 65%. The  left ventricle has normal function. The left ventricle has no regional  wall motion abnormalities. Left ventricular diastolic parameters were  normal.   2. Right ventricular systolic function is normal. The right ventricular  size is normal. There is normal pulmonary artery systolic pressure.   3. The mitral valve is degenerative. Trivial mitral valve regurgitation.  No evidence of mitral stenosis.   4. The aortic valve has been repaired/replaced. Aortic valve  regurgitation is not visualized. No aortic stenosis is present. There is a  23 mm Ultra, stented (TAVR) valve  present in the aortic position. Aortic  valve area, by VTI measures 1.16 cm. Aortic   valve mean gradient measures 9.0 mmHg. Aortic valve Vmax measures 2.06  m/s.   5. Aortic root/ascending aorta has been repaired/replaced. There is mild  dilatation of the aortic root, measuring 38 mm.   6. The inferior vena cava is normal in size with greater than 50%  respiratory variability, suggesting right atrial pressure of 3 mmHg.   7. Compared to study dated 05/14/2023, the mean TAVR gradient, DI and VMax  are similar. The calculated AVA by VTI has decreased from 1.7cm2 to  1.16cm2 but the AVA is likely underestimated on current study due to small  LVOT measurement.    Epic records are reviewed at length today   CHA2DS2-VASc Score = 3  The patient's score is based upon: CHF History: 0 HTN History: 0 Diabetes History: 0 Stroke History: 0 Vascular Disease History: 0 Age Score: 2 Gender Score:  1       ASSESSMENT AND PLAN: Persistent Atrial Fibrillation/atrial flutter (ICD10:  I48.19) The patient's CHA2DS2-VASc score is 3, indicating a 3.2% annual risk of stroke.   S/p CTI ablation 01/07/22 Patient in SR today.  Continue Eliquis  2.5 mg BID If she has more frequent afib, could consider resuming amiodarone .   Secondary Hypercoagulable State (ICD10:  D68.69) The patient is at significant risk for stroke/thromboembolism based upon her CHA2DS2-VASc Score of 3.  Continue Apixaban  (Eliquis ). No bleeding issues.   VHD Severe AS S/p valve in valve TAVR 06/19/21 Followed by Dr Court   Follow up with Dr Court per recall.    Daril Kicks PA-C [facility] [address] [place], [place] [address] [phone] 06/08/2024 11:05 AM

## 2024-07-05 DIAGNOSIS — L578 Other skin changes due to chronic exposure to nonionizing radiation: Secondary | ICD-10-CM | POA: Diagnosis not present

## 2024-07-05 DIAGNOSIS — L821 Other seborrheic keratosis: Secondary | ICD-10-CM | POA: Diagnosis not present
